# Patient Record
Sex: Female | Born: 1935 | Race: White | Hispanic: No | Smoking: Former smoker
Health system: Southern US, Community
[De-identification: ages and names within clinical notes are randomized; demographics above are authoritative.]

## PROBLEM LIST (undated history)

## (undated) DIAGNOSIS — S42309A Unspecified fracture of shaft of humerus, unspecified arm, initial encounter for closed fracture: Secondary | ICD-10-CM

## (undated) DIAGNOSIS — I480 Paroxysmal atrial fibrillation: Secondary | ICD-10-CM

## (undated) DIAGNOSIS — S72142D Displaced intertrochanteric fracture of left femur, subsequent encounter for closed fracture with routine healing: Secondary | ICD-10-CM

## (undated) DIAGNOSIS — S22080A Wedge compression fracture of T11-T12 vertebra, initial encounter for closed fracture: Secondary | ICD-10-CM

## (undated) DIAGNOSIS — F0150 Vascular dementia without behavioral disturbance: Secondary | ICD-10-CM

## (undated) DIAGNOSIS — F0390 Unspecified dementia without behavioral disturbance: Secondary | ICD-10-CM

## (undated) DIAGNOSIS — S72009A Fracture of unspecified part of neck of unspecified femur, initial encounter for closed fracture: Secondary | ICD-10-CM

## (undated) DIAGNOSIS — N390 Urinary tract infection, site not specified: Secondary | ICD-10-CM

## (undated) DIAGNOSIS — D62 Acute posthemorrhagic anemia: Secondary | ICD-10-CM

## (undated) DIAGNOSIS — I2510 Atherosclerotic heart disease of native coronary artery without angina pectoris: Secondary | ICD-10-CM

## (undated) DIAGNOSIS — K219 Gastro-esophageal reflux disease without esophagitis: Secondary | ICD-10-CM

## (undated) DIAGNOSIS — M810 Age-related osteoporosis without current pathological fracture: Secondary | ICD-10-CM

## (undated) DIAGNOSIS — I350 Nonrheumatic aortic (valve) stenosis: Secondary | ICD-10-CM

## (undated) DIAGNOSIS — M25561 Pain in right knee: Secondary | ICD-10-CM

## (undated) DIAGNOSIS — L8990 Pressure ulcer of unspecified site, unspecified stage: Secondary | ICD-10-CM

## (undated) DIAGNOSIS — F419 Anxiety disorder, unspecified: Secondary | ICD-10-CM

## (undated) DIAGNOSIS — R5381 Other malaise: Secondary | ICD-10-CM

## (undated) DIAGNOSIS — I119 Hypertensive heart disease without heart failure: Secondary | ICD-10-CM

## (undated) DIAGNOSIS — H6120 Impacted cerumen, unspecified ear: Secondary | ICD-10-CM

## (undated) DIAGNOSIS — E871 Hypo-osmolality and hyponatremia: Secondary | ICD-10-CM

## (undated) DIAGNOSIS — G4700 Insomnia, unspecified: Secondary | ICD-10-CM

## (undated) DIAGNOSIS — R339 Retention of urine, unspecified: Secondary | ICD-10-CM

## (undated) DIAGNOSIS — H9110 Presbycusis, unspecified ear: Secondary | ICD-10-CM

## (undated) DIAGNOSIS — F329 Major depressive disorder, single episode, unspecified: Secondary | ICD-10-CM

## (undated) DIAGNOSIS — E785 Hyperlipidemia, unspecified: Secondary | ICD-10-CM

## (undated) DIAGNOSIS — J309 Allergic rhinitis, unspecified: Secondary | ICD-10-CM

## (undated) DIAGNOSIS — T148XXA Other injury of unspecified body region, initial encounter: Secondary | ICD-10-CM

## (undated) DIAGNOSIS — R630 Anorexia: Secondary | ICD-10-CM

## (undated) DIAGNOSIS — R2681 Unsteadiness on feet: Secondary | ICD-10-CM

## (undated) DIAGNOSIS — E46 Unspecified protein-calorie malnutrition: Secondary | ICD-10-CM

## (undated) DIAGNOSIS — K5900 Constipation, unspecified: Secondary | ICD-10-CM

## (undated) DIAGNOSIS — F32A Depression, unspecified: Secondary | ICD-10-CM

## (undated) DIAGNOSIS — A499 Bacterial infection, unspecified: Secondary | ICD-10-CM

## (undated) DIAGNOSIS — R531 Weakness: Secondary | ICD-10-CM

## (undated) DIAGNOSIS — R29898 Other symptoms and signs involving the musculoskeletal system: Secondary | ICD-10-CM

## (undated) DIAGNOSIS — H409 Unspecified glaucoma: Secondary | ICD-10-CM

## (undated) DIAGNOSIS — Z1612 Extended spectrum beta lactamase (ESBL) resistance: Secondary | ICD-10-CM

## (undated) DIAGNOSIS — K5792 Diverticulitis of intestine, part unspecified, without perforation or abscess without bleeding: Secondary | ICD-10-CM

## (undated) HISTORY — DX: Wedge compression fracture of T11-T12 vertebra, initial encounter for closed fracture: S22.080A

## (undated) HISTORY — PX: CATARACT EXTRACTION, BILATERAL: SHX1313

## (undated) HISTORY — DX: Acute posthemorrhagic anemia: D62

## (undated) HISTORY — DX: Presbycusis, unspecified ear: H91.10

## (undated) HISTORY — PX: FRACTURE SURGERY: SHX138

## (undated) HISTORY — DX: Hyperlipidemia, unspecified: E78.5

## (undated) HISTORY — DX: Vascular dementia, unspecified severity, without behavioral disturbance, psychotic disturbance, mood disturbance, and anxiety: F01.50

## (undated) HISTORY — DX: Weakness: R53.1

## (undated) HISTORY — DX: Anorexia: R63.0

## (undated) HISTORY — DX: Allergic rhinitis, unspecified: J30.9

## (undated) HISTORY — DX: Unspecified protein-calorie malnutrition: E46

## (undated) HISTORY — DX: Retention of urine, unspecified: R33.9

## (undated) HISTORY — DX: Hypo-osmolality and hyponatremia: E87.1

## (undated) HISTORY — DX: Constipation, unspecified: K59.00

## (undated) HISTORY — DX: Age-related osteoporosis without current pathological fracture: M81.0

## (undated) HISTORY — PX: PARTIAL COLECTOMY: SHX5273

## (undated) HISTORY — DX: Insomnia, unspecified: G47.00

## (undated) HISTORY — DX: Atherosclerotic heart disease of native coronary artery without angina pectoris: I25.10

## (undated) HISTORY — DX: Pressure ulcer of unspecified site, unspecified stage: L89.90

## (undated) HISTORY — DX: Impacted cerumen, unspecified ear: H61.20

## (undated) HISTORY — DX: Other symptoms and signs involving the musculoskeletal system: R29.898

## (undated) HISTORY — DX: Major depressive disorder, single episode, unspecified: F32.9

## (undated) HISTORY — DX: Unsteadiness on feet: R26.81

## (undated) HISTORY — DX: Depression, unspecified: F32.A

## (undated) HISTORY — DX: Urinary tract infection, site not specified: N39.0

## (undated) HISTORY — DX: Pain in right knee: M25.561

## (undated) HISTORY — DX: Displaced intertrochanteric fracture of left femur, subsequent encounter for closed fracture with routine healing: S72.142D

## (undated) HISTORY — DX: Anxiety disorder, unspecified: F41.9

## (undated) HISTORY — DX: Diverticulitis of intestine, part unspecified, without perforation or abscess without bleeding: K57.92

## (undated) HISTORY — DX: Other malaise: R53.81

---

## 2007-04-14 LAB — HM COLONOSCOPY

## 2012-04-28 ENCOUNTER — Other Ambulatory Visit (INDEPENDENT_AMBULATORY_CARE_PROVIDER_SITE_OTHER): Payer: Medicare PPO

## 2012-04-28 ENCOUNTER — Encounter: Payer: Self-pay | Admitting: Internal Medicine

## 2012-04-28 ENCOUNTER — Ambulatory Visit (INDEPENDENT_AMBULATORY_CARE_PROVIDER_SITE_OTHER): Payer: Medicare PPO | Admitting: Internal Medicine

## 2012-04-28 VITALS — BP 118/68 | HR 69 | Temp 96.8°F | Ht 68.0 in | Wt 132.5 lb

## 2012-04-28 DIAGNOSIS — Z0000 Encounter for general adult medical examination without abnormal findings: Secondary | ICD-10-CM

## 2012-04-28 DIAGNOSIS — Z1329 Encounter for screening for other suspected endocrine disorder: Secondary | ICD-10-CM

## 2012-04-28 DIAGNOSIS — Z130 Encounter for screening for diseases of the blood and blood-forming organs and certain disorders involving the immune mechanism: Secondary | ICD-10-CM

## 2012-04-28 DIAGNOSIS — M949 Disorder of cartilage, unspecified: Secondary | ICD-10-CM

## 2012-04-28 DIAGNOSIS — M8580 Other specified disorders of bone density and structure, unspecified site: Secondary | ICD-10-CM

## 2012-04-28 DIAGNOSIS — I10 Essential (primary) hypertension: Secondary | ICD-10-CM | POA: Insufficient documentation

## 2012-04-28 DIAGNOSIS — Z131 Encounter for screening for diabetes mellitus: Secondary | ICD-10-CM

## 2012-04-28 DIAGNOSIS — Z13220 Encounter for screening for lipoid disorders: Secondary | ICD-10-CM

## 2012-04-28 DIAGNOSIS — K219 Gastro-esophageal reflux disease without esophagitis: Secondary | ICD-10-CM

## 2012-04-28 DIAGNOSIS — IMO0001 Reserved for inherently not codable concepts without codable children: Secondary | ICD-10-CM

## 2012-04-28 LAB — BASIC METABOLIC PANEL
BUN: 22 mg/dL (ref 6–23)
Chloride: 104 mEq/L (ref 96–112)
Creatinine, Ser: 1 mg/dL (ref 0.4–1.2)
Glucose, Bld: 111 mg/dL — ABNORMAL HIGH (ref 70–99)

## 2012-04-28 LAB — CBC
Hemoglobin: 12 g/dL (ref 12.0–15.0)
MCHC: 32.8 g/dL (ref 30.0–36.0)
MCV: 95.4 fl (ref 78.0–100.0)
Platelets: 314 10*3/uL (ref 150.0–400.0)
RBC: 3.83 Mil/uL — ABNORMAL LOW (ref 3.87–5.11)

## 2012-04-28 LAB — LIPID PANEL
Cholesterol: 182 mg/dL (ref 0–200)
Total CHOL/HDL Ratio: 4
Triglycerides: 124 mg/dL (ref 0.0–149.0)

## 2012-04-28 LAB — HEMOGLOBIN A1C: Hgb A1c MFr Bld: 6.1 % (ref 4.6–6.5)

## 2012-04-28 MED ORDER — OMEPRAZOLE 20 MG PO CPDR: 20.0000 mg | DELAYED_RELEASE_CAPSULE | Freq: Every day | ORAL | Status: DC

## 2012-04-28 MED ORDER — AMIODARONE HCL 200 MG PO TABS: 200.0000 mg | ORAL_TABLET | Freq: Every day | ORAL | Status: DC

## 2012-04-28 MED ORDER — AMLODIPINE BESYLATE 10 MG PO TABS: 10.0000 mg | ORAL_TABLET | Freq: Every day | ORAL | Status: DC

## 2012-04-28 MED ORDER — LISINOPRIL 2.5 MG PO TABS: 2.5000 mg | ORAL_TABLET | Freq: Every day | ORAL | Status: DC

## 2012-04-28 MED ORDER — ALENDRONATE SODIUM 70 MG PO TABS: 70.0000 mg | ORAL_TABLET | ORAL | Status: DC

## 2012-04-28 NOTE — Patient Instructions (Signed)

## 2012-04-28 NOTE — Progress Notes (Signed)
HPI  Pt presents to the clinic today to establish care. She recently moved here from [facility] and is transferring care here. She has no concerns today other than she wants her medications refilled.  Flu: 2013 Pneumovax: 2013 Zostavax: never Colonoscopy: 2013 Eye exam: 2013 Mammogram: 2013 Bone density: 2012 Tetanus: within 10 years  Past Medical History  Diagnosis Date  . Diverticulitis     Current Outpatient Prescriptions  Medication Sig Dispense Refill  . alendronate (FOSAMAX) 70 MG tablet Take 1 tablet (70 mg total) by mouth every 7 (seven) days. Take with a full glass of water on an empty stomach.  4 tablet  3  . amiodarone (PACERONE) 200 MG tablet Take 1 tablet (200 mg total) by mouth daily.  30 tablet  3  . amLODipine (NORVASC) 10 MG tablet Take 1 tablet (10 mg total) by mouth daily.  30 tablet  3  . aspirin EC 325 MG tablet Take 325 mg by mouth daily.      . folic acid (FOLVITE) 1 MG tablet Take 1 mg by mouth daily.      Marland Kitchen lisinopril (PRINIVIL,ZESTRIL) 2.5 MG tablet Take 1 tablet (2.5 mg total) by mouth daily.  30 tablet  3  . Multiple Vitamin (MULTIVITAMIN) tablet Take 1 tablet by mouth daily.      Marland Kitchen omeprazole (PRILOSEC) 20 MG capsule Take 1 capsule (20 mg total) by mouth daily.  30 capsule  3    No Known Allergies  Family History  Problem Relation Age of Onset  . Hypertension Other   . Cancer Mother     stomach  . Diabetes Neg Hx   . Heart disease Neg Hx   . Stroke Neg Hx     History   Social History  . Marital Status: Widowed    Spouse Name: N/A    Number of Children: N/A  . Years of Education: 13+   Occupational History  . Retired    Social History Main Topics  . Smoking status: Former Smoker    Types: Cigarettes  . Smokeless tobacco: Never Used  . Alcohol Use: No  . Drug Use: No  . Sexually Active: No   Other Topics Concern  . Not on file   Social History Narrative   Regular exercise-noCaffeine Use-yes    ROS:  Constitutional: Denies  fever, malaise, fatigue, headache or abrupt weight changes.  HEENT: Denies eye pain, eye redness, ear pain, ringing in the ears, wax buildup, runny nose, nasal congestion, bloody nose, or sore throat. Respiratory: Denies difficulty breathing, shortness of breath, cough or sputum production.   Cardiovascular: Denies chest pain, chest tightness, palpitations or swelling in the hands or feet.  Gastrointestinal: Pt reports mild constipation. Denies abdominal pain, bloating, diarrhea or blood in the stool.  GU: Denies frequency, urgency, pain with urination, blood in urine, odor or discharge. Musculoskeletal: Denies decrease in range of motion, difficulty with gait, muscle pain or joint pain and swelling.  Skin: Denies redness, rashes, lesions or ulcercations.  Neurological: Denies dizziness, difficulty with memory, difficulty with speech or problems with balance and coordination.   No other specific complaints in a complete review of systems (except as listed in HPI above).  PE:  BP 118/68  Pulse 69  Temp 96.8 F (36 C) (Oral)  Wt 132 lb 8 oz (60.102 kg)  SpO2 98% Wt Readings from Last 3 Encounters:  04/28/12 132 lb 8 oz (60.102 kg)    General: Appears her stated age, well developed,  well nourished in NAD. HEENT: Head: normal shape and size; Eyes: sclera white, no icterus, conjunctiva pink, PERRLA and EOMs intact; Ears: Tm's gray and intact, normal light reflex; Nose: mucosa pink and moist, septum midline; Throat/Mouth: Teeth present, mucosa pink and moist, no lesions or ulcerations noted.  Neck: Normal range of motion. Neck supple, trachea midline. No massses, lumps or thyromegaly present.  Cardiovascular: Normal rate and rhythm. S1,S2 noted.  No murmur, rubs or gallops noted. No JVD or BLE edema. No carotid bruits noted. Pulmonary/Chest: Normal effort and positive vesicular breath sounds. No respiratory distress. No wheezes, rales or ronchi noted.  Abdomen: Soft and nontender. Normal bowel  sounds, no bruits noted. No distention or masses noted. Liver, spleen and kidneys non palpable. Musculoskeletal: Normal range of motion. No signs of joint swelling. No difficulty with gait.  Neurological: Alert and oriented. Cranial nerves II-XII intact. Coordination normal. +DTRs bilaterally. Psychiatric: Mood and affect normal. Behavior is normal. Judgment and thought content normal.     Assessment and Plan:  Preventative Health Maintenance:  Continue Healthy Lifestyle Will obtain basic screening labs today Pt declines Zostavax All HM UTD  Hypertension:  Continue low sodium diet Refilled all meds  Reflux:  Avoid spicy foods and mints Refilled meds  Age related bone loss:  Continue low weight bearing exercise Refilled meds  RTC in 6 months or sooner if needed

## 2012-04-29 ENCOUNTER — Encounter: Payer: Self-pay | Admitting: Internal Medicine

## 2012-04-29 ENCOUNTER — Telehealth: Payer: Self-pay | Admitting: Internal Medicine

## 2012-04-29 LAB — VITAMIN D 25 HYDROXY (VIT D DEFICIENCY, FRACTURES): Vit D, 25-Hydroxy: 44 ng/mL (ref 30–89)

## 2012-04-29 MED ORDER — FOLIC ACID 1 MG PO TABS: 1.0000 mg | ORAL_TABLET | Freq: Every day | ORAL | Status: DC

## 2012-04-29 NOTE — Telephone Encounter (Signed)
Rx for Folic Acid sent to [facility] and pt informed of lab results.    Jacqueline [name],  Can you please send this and call Mrs. Winders and let her know that all of her labs were normal.  Jacqueline [name]

## 2012-04-29 NOTE — Telephone Encounter (Signed)
Patient is requesting a refill on her folic Acid be sent to [facility]

## 2012-05-04 ENCOUNTER — Telehealth: Payer: Self-pay | Admitting: Internal Medicine

## 2012-05-04 NOTE — Telephone Encounter (Signed)
Pt got a letter in the mail with her lab results.  She has questions about what "essentially normal" means.  She is requesting a call.

## 2012-05-05 NOTE — Telephone Encounter (Signed)
Pt informed of NP's advisement.  

## 2012-05-05 NOTE — Telephone Encounter (Signed)
Ash, Essentially normal means that even though her RBC was low, her glucose level was high (if she had eaten) and the fact that her GFR was low, that the abnormal levels are nothing to worry about. These can be normal variations with age. So, even though the appear abnormal on the lab report, they are normal for an person her age. Rene Kocher

## 2012-05-16 ENCOUNTER — Telehealth: Payer: Self-pay | Admitting: Internal Medicine

## 2012-05-16 NOTE — Telephone Encounter (Signed)
[place], could you call this patient to schedule an appointment with one of the physicians at [facility]? Thanks!

## 2012-05-16 NOTE — Telephone Encounter (Signed)
Patient would like to start seeing one of the physicians at the [facility] location due to the location being closer to her. Is this okay? Thanks!

## 2012-05-16 NOTE — Telephone Encounter (Signed)
Ok with me 

## 2012-05-17 NOTE — Telephone Encounter (Signed)
Spoke w/Dr. Beverely Low who said it would be fine for patient to transfer to [facility]. Spoke w/pt. Pt understood.

## 2012-07-04 ENCOUNTER — Telehealth: Payer: Self-pay | Admitting: Family Medicine

## 2012-07-04 ENCOUNTER — Ambulatory Visit (INDEPENDENT_AMBULATORY_CARE_PROVIDER_SITE_OTHER): Payer: Medicare PPO | Admitting: Family Medicine

## 2012-07-04 ENCOUNTER — Encounter: Payer: Self-pay | Admitting: Family Medicine

## 2012-07-04 VITALS — BP 110/60 | HR 60 | Temp 98.7°F | Ht 65.5 in | Wt 133.4 lb

## 2012-07-04 DIAGNOSIS — I10 Essential (primary) hypertension: Secondary | ICD-10-CM

## 2012-07-04 DIAGNOSIS — G4700 Insomnia, unspecified: Secondary | ICD-10-CM

## 2012-07-04 DIAGNOSIS — R011 Cardiac murmur, unspecified: Secondary | ICD-10-CM

## 2012-07-04 DIAGNOSIS — H9113 Presbycusis, bilateral: Secondary | ICD-10-CM

## 2012-07-04 DIAGNOSIS — H9110 Presbycusis, unspecified ear: Secondary | ICD-10-CM

## 2012-07-04 MED ORDER — ZOLPIDEM TARTRATE 5 MG PO TABS: 2.5000 mg | ORAL_TABLET | Freq: Every evening | ORAL | Status: DC | PRN

## 2012-07-04 MED ORDER — AMIODARONE HCL 200 MG PO TABS: 200.0000 mg | ORAL_TABLET | Freq: Every day | ORAL | Status: DC

## 2012-07-04 NOTE — Patient Instructions (Addendum)
Follow up in 6 weeks to recheck sleep Start the Zolpidem nightly- 1/2 tab only- for sleep We'll call you with your hearing appt and your cardiology appt to evaluate your murmur Please call me and let me know if you are taking the Amiodarone Call with any questions or concerns Welcome!  We're glad to have you!

## 2012-07-04 NOTE — Telephone Encounter (Signed)
Medications are the same and already documented in the chart.   KP

## 2012-07-04 NOTE — Telephone Encounter (Signed)
Patient states that she was supposed to call back with this information  She is not taking amiodarine, she is taking pacerone

## 2012-07-04 NOTE — Progress Notes (Signed)
  Subjective:    Patient ID: Jacqueline [name], female    DOB: Sep 01, 1928, 79 y.o.   MRN: [id number]  HPI New to establish.  Previously seeing NP at [facility].  HTN- chronic problem, excellent control on Norvasc, Lisinopril.  Denies CP, SOB, HAs, visual changes, edema.   Osteoporosis- chronic problem, UTD on DEXA, on Fosamax weekly.  Walking regularly.  GERD- chronic problem.  sxs are well controlled on Omeprazole.  Denies any current heartburn  Hearing loss- pt would like referral for hearing test, son complains that she cannot hear him.  Insomnia- pt reports 'years' of difficulty sleeping.  Trouble both falling and staying asleep.  Has not tried anything OTC.     Review of Systems For ROS see HPI     Objective:   Physical Exam  Vitals reviewed. Constitutional: She is oriented to person, place, and time. She appears well-developed and well-nourished. No distress.  HENT:  Head: Normocephalic and atraumatic.  Hard of hearing  Eyes: Conjunctivae and EOM are normal. Pupils are equal, round, and reactive to light.  Neck: Normal range of motion. Neck supple. No thyromegaly present.  Cardiovascular: Normal rate, regular rhythm and intact distal pulses.   Murmur (II-III/VI blowing SEM heard best at upper sternal border) heard. Pulmonary/Chest: Effort normal and breath sounds normal. No respiratory distress. She has no wheezes. She has no rales.  Abdominal: Soft. She exhibits no distension. There is no tenderness. There is no rebound and no guarding.  Musculoskeletal: She exhibits no edema.  Lymphadenopathy:    She has no cervical adenopathy.  Neurological: She is alert and oriented to person, place, and time.  Skin: Skin is warm and dry.  Psychiatric: She has a normal mood and affect. Her behavior is normal.          Assessment & Plan:

## 2012-07-05 ENCOUNTER — Telehealth: Payer: Self-pay | Admitting: *Deleted

## 2012-07-05 NOTE — Telephone Encounter (Signed)
Patient called to make you aware that she is taking amiodarone.

## 2012-07-08 NOTE — Assessment & Plan Note (Signed)
New.  Refer to audiology for complete evaluation and tx 

## 2012-07-08 NOTE — Assessment & Plan Note (Signed)
New.  Difficult problem for elderly as most medications used for sleep carry some risk in this population.  Not interested in Benzos or antihistamine due to side effects and risk of falling.  Trazodone would be my 1st choice but it interacts w/ amiodarone (again, not sure if she even needs this).  Will start low dose Ambien and monitor for side effects and symptom improvement.  Pt expressed understanding and is in agreement w/ plan.

## 2012-07-08 NOTE — Assessment & Plan Note (Signed)
New to provider, chronic for pt.  Excellent control.  Asymptomatic.  Has never had w/u for murmur and pt doesn't know why she is on amiodarone and there is no explanation for this in previous OV.  Would like to stop med if possible- will enlist the help of cardiology w/ this.

## 2012-07-08 NOTE — Assessment & Plan Note (Signed)
New.  Refer to cards for evaluation and ECHO.  Also pt reports she is on Amiodarone but denies ever seeing cards previously.  Unclear why she is on thie medicine and if she even needs to be.

## 2012-07-14 ENCOUNTER — Telehealth: Payer: Self-pay | Admitting: *Deleted

## 2012-07-14 NOTE — Telephone Encounter (Signed)
Spoke with pt made her aware that audiology appt had not been made as of yet. Advised she would be contacted by practice when it is scheduled.

## 2012-07-18 ENCOUNTER — Telehealth: Payer: Self-pay | Admitting: Family Medicine

## 2012-07-18 NOTE — Telephone Encounter (Signed)
Pt should stop Ambien and wait for her cardiology work up before we start any medications (since they can interact w/ her amiodarone)

## 2012-07-18 NOTE — Telephone Encounter (Signed)
Discuss with patient  

## 2012-07-18 NOTE — Telephone Encounter (Signed)
Patient states she received a letter from [facility] stating that zolpidem tartrate is not recommended for people her age due to side effects. Patient states she does not like the way this medication makes her feel and she would like to know what to do. Please call pt back at home, if no answer please try mobile.

## 2012-08-02 ENCOUNTER — Telehealth: Payer: Self-pay | Admitting: Family Medicine

## 2012-08-02 NOTE — Telephone Encounter (Signed)
Error. BC °

## 2012-08-03 ENCOUNTER — Ambulatory Visit (INDEPENDENT_AMBULATORY_CARE_PROVIDER_SITE_OTHER): Payer: Medicare PPO | Admitting: Family Medicine

## 2012-08-03 ENCOUNTER — Telehealth: Payer: Self-pay | Admitting: Family Medicine

## 2012-08-03 ENCOUNTER — Institutional Professional Consult (permissible substitution): Payer: Medicare PPO | Admitting: Cardiovascular Disease

## 2012-08-03 ENCOUNTER — Encounter: Payer: Self-pay | Admitting: Lab

## 2012-08-03 ENCOUNTER — Encounter: Payer: Self-pay | Admitting: Family Medicine

## 2012-08-03 VITALS — BP 124/74 | HR 59 | Temp 97.3°F | Ht 65.5 in | Wt 132.0 lb

## 2012-08-03 DIAGNOSIS — K219 Gastro-esophageal reflux disease without esophagitis: Secondary | ICD-10-CM

## 2012-08-03 DIAGNOSIS — R0789 Other chest pain: Secondary | ICD-10-CM

## 2012-08-03 DIAGNOSIS — R82998 Other abnormal findings in urine: Secondary | ICD-10-CM

## 2012-08-03 DIAGNOSIS — N390 Urinary tract infection, site not specified: Secondary | ICD-10-CM

## 2012-08-03 DIAGNOSIS — R8290 Unspecified abnormal findings in urine: Secondary | ICD-10-CM

## 2012-08-03 DIAGNOSIS — IMO0001 Reserved for inherently not codable concepts without codable children: Secondary | ICD-10-CM

## 2012-08-03 LAB — D-DIMER, QUANTITATIVE: D-Dimer, Quant: 0.84 ug/mL-FEU — ABNORMAL HIGH (ref 0.00–0.48)

## 2012-08-03 NOTE — Patient Instructions (Addendum)
We'll call you with your lab results Increase your Omeprazole to twice daily If you have worsening chest pressure- please go to the ER Store your urine sample in the fridge and bring it by when you are able Restart the 1/2 tab of Zolpidem nightly for sleep Call with any questions or concerns Hang in there!

## 2012-08-03 NOTE — Progress Notes (Signed)
  Subjective:    Patient ID: Jacqueline [name], female    DOB: [birth year], 79 y.o.   MRN: [id number]  HPI Urinary odor- has odor to urine.  sxs started a couple days ago.  No burning.  + frequency and urgency but pt reports this is ongoing.  No fevers, chills, back pain.  SOB- sxs are intermittent, will have sensation of chest heaviness, shortness of breath.  Denies palpitations.  + anxiety- daughter reports 'she's a worrier'.  Episodes will last ~20 minutes.  Unable to lie flat, 'it makes everything worse'.  Has associated epigastric pain.  No relation to eating.  Had episode overnight- started when she woke to use restroom.  Last episode was early this AM.  Tends to only occur overnight or early in the AM.  No recent immobility, leg swelling.  Currently asymptomatic.   Review of Systems For ROS see HPI    Objective:   Physical Exam  Vitals reviewed. Constitutional: She is oriented to person, place, and time. She appears well-developed and well-nourished. No distress.  HENT:  Head: Normocephalic and atraumatic.  Eyes: Conjunctivae and EOM are normal. Pupils are equal, round, and reactive to light.  Neck: Normal range of motion. Neck supple. No thyromegaly present.  Cardiovascular: Normal rate, regular rhythm and intact distal pulses.   Murmur (loud, holosystolic III/VI SEM heard best and RUSB) heard. Pulmonary/Chest: Effort normal and breath sounds normal. No respiratory distress. She has no wheezes. She has no rales. She exhibits no tenderness.  Abdominal: Soft. She exhibits no distension. There is no tenderness.  Musculoskeletal: She exhibits no edema and no tenderness.  Lymphadenopathy:    She has no cervical adenopathy.  Neurological: She is alert and oriented to person, place, and time.  Skin: Skin is warm and dry.  Psychiatric: She has a normal mood and affect. Her behavior is normal.          Assessment & Plan:

## 2012-08-03 NOTE — Telephone Encounter (Signed)
Patient Information:  Caller Name: Cam[place]yn  Phone: [phone]  Patient: Jacqueline, [name]  Gender: Female  DOB: 04/26/1928  Age: 79 Years  PCP: Sheliah Hatch.  Office Follow Up:  Does the office need to follow up with this patient?: No  Instructions For The Office: N/A  RN Note:  Appointment scheduled already for 3:00pm this afternoon with Dr. Beverely Low. Advised patient to call 911 if she develops chest pain lasting longer than 5 minutes or if symptoms worsen. Patient verbalized understanding.  Symptoms  Reason For Call & Symptoms: Reports feeling hot from head to toe, chest pressure, shortness of breath. Feels tired most of the day after having episodes. Reports feeling lightheaded during episodes also. 1-2 episodes noted each day.  Reviewed Health History In EMR: Yes  Reviewed Medications In EMR: Yes  Reviewed Allergies In EMR: Yes  Reviewed Surgeries / Procedures: Yes  Date of Onset of Symptoms: 07/20/2012  Guideline(s) Used:  Chest Pain  Disposition Per Guideline:   Go to ED Now (or to Office with PCP Approval)  Reason For Disposition Reached:   Intermittent chest pain and pain has been increasing in severity or frequency  Advice Given:  N/A  Patient Will Follow Care Advice:  YES

## 2012-08-04 ENCOUNTER — Telehealth: Payer: Self-pay | Admitting: Family Medicine

## 2012-08-04 ENCOUNTER — Telehealth: Payer: Self-pay | Admitting: *Deleted

## 2012-08-04 LAB — POCT URINALYSIS DIPSTICK
Bilirubin, UA: NEGATIVE
Glucose, UA: NEGATIVE
Nitrite, UA: NEGATIVE
Urobilinogen, UA: 0.2
pH, UA: 6

## 2012-08-04 MED ORDER — CEPHALEXIN 500 MG PO CAPS: 500.0000 mg | ORAL_CAPSULE | Freq: Two times a day (BID) | ORAL | Status: DC

## 2012-08-04 MED ORDER — OMEPRAZOLE 20 MG PO CPDR: 20.0000 mg | DELAYED_RELEASE_CAPSULE | Freq: Two times a day (BID) | ORAL | Status: DC

## 2012-08-04 NOTE — Telephone Encounter (Signed)
Spoke with the pt and informed her of recent UC results and note.  Pt understood and agreed.   New rx for Keflex 500mg  1 tab bid x 5 days was sent to the pharmacy by e-script.  Pt stated that Dr. Beverely Low increased her Omeprazole 20mg  to twice a day, and she will be needing a new rx..  Also sent in new rx for the Omeprazole 20mg  1 tab bid to the pharmacy by e-script.//AB/CMA

## 2012-08-04 NOTE — Telephone Encounter (Signed)
[facility] Triage Call Report Triage Record Num: [id number] Operator: Remonia Richter Patient Name: Jacqueline [name] Call Date & Time: 08/03/2012 7:04:31PM Patient Phone: [phone] PCP: Lezlie Octave Patient Gender: Female PCP Fax : [phone] Patient DOB: 12/21/1928 Practice Name: [facility] Reason for Call: [facility] calling to report lab drawn 08/03/12 as stat with Triponin I negative at 0.02 and D dimer pending, Dr Felicity Coyer called with lab and notified Protocol(s) Used: PCP Calls, No Triage (Adult) Recommended Outcome per Protocol: Call Provider within 24 Hours Reason for Outcome: Lab calling with test results Care Advice: ~

## 2012-08-04 NOTE — Telephone Encounter (Signed)
Message copied by Verdie Shire on Thu Aug 04, 2012  5:16 PM ------      Message from: Sheliah Hatch      Created: Thu Aug 04, 2012  4:38 PM       Pt's urine is highly suspicious for infxn.  Start Keflex 500mg  BID x5 days ------

## 2012-08-05 NOTE — Assessment & Plan Note (Signed)
New.  Atypical for cardiac pain.  Pt w/ cardiology appt upcoming.  Will check troponin to assess for myocardial damage.  Suspicion for PE very low but will check DDimer.  EKG w/out acute abnormality.  Suspect pt's sxs are more a combo of GERD and anxiety.  Increase PPI to BID and pt to take Zolpidem for sleep.  Reviewed supportive care and red flags that should prompt return.  Pt expressed understanding and is in agreement w/ plan.

## 2012-08-05 NOTE — Assessment & Plan Note (Signed)
New.  Pt unable to give urine sample while in office.  Sent home w/ specimen cup to return in AM.  UA highly suspicious for infxn.  Start abx.  Await culture.

## 2012-08-05 NOTE — Assessment & Plan Note (Signed)
Deteriorated.  Double PPI to BID dosing.  Pt expressed understanding and is in agreement w/ plan.

## 2012-08-07 LAB — URINE CULTURE: Colony Count: >=100000

## 2012-08-09 ENCOUNTER — Ambulatory Visit: Payer: Medicare PPO | Admitting: Audiology

## 2012-08-10 ENCOUNTER — Telehealth: Payer: Self-pay | Admitting: Family Medicine

## 2012-08-10 NOTE — Telephone Encounter (Signed)
Pt notified to take the abx per Tabori's request. Pt stated that she just began the meds because her son just picked them up yesterday.

## 2012-08-10 NOTE — Telephone Encounter (Signed)
Pt states that she has had diarrhea only today. States that she began having diarrhea about 3-4 hours after her first dose of Keflex. States that she has a history of diarrhea but she has only had it once. The only difference in her diet is the Keflex. Please advise.

## 2012-08-10 NOTE — Telephone Encounter (Signed)
Patient having diarrhea from her cephALEXin (KEFLEX) 500 MG capsule . She is needing a call before her next dose this evening.

## 2012-08-10 NOTE — Telephone Encounter (Signed)
Keflex is the most gentle of the abx.  Since she only took one dose I would continue the medication and see if her sxs continue.  Also, please stress that she needs to take medication- this prescription was sent for her 6 days ago and should have been started before now.

## 2012-08-11 ENCOUNTER — Ambulatory Visit: Payer: Medicare HMO | Attending: Audiology | Admitting: Audiology

## 2012-08-11 DIAGNOSIS — Z0110 Encounter for examination of ears and hearing without abnormal findings: Secondary | ICD-10-CM | POA: Insufficient documentation

## 2012-08-11 DIAGNOSIS — Z0389 Encounter for observation for other suspected diseases and conditions ruled out: Secondary | ICD-10-CM | POA: Insufficient documentation

## 2012-08-11 DIAGNOSIS — H9113 Presbycusis, bilateral: Secondary | ICD-10-CM

## 2012-08-11 NOTE — Progress Notes (Signed)
Subjective:     Patient ID: Jacqueline [name], female   DOB: Jun 26, 1928, 79 y.o.   MRN: [id number]  HPI Comments: Name:  Jacqueline [name] DOB:   20-Apr-1928 MRN:    [id number] Date of Evaluation:  08/11/2012  History: Pt. In 79 y/o female with complaint of decreased hearing for past 6 months.  Notes difficulty in listening to the TV at a normal volume,listening in the presence of background noise and often needs conversational speech repeated.  No history of ear infections but does not an uncomfortable feeling in the right ear when trying to sleep on that side.  Pure Tone Thresholds:  Mild to profound sensory neural loss on the right side beginning at 2000Hz  - 8000Hz  and a moderate to profound sensory neural loss on the left side for the same frequencies.  Speech Reception/Detection Thresholds: Right Ear: 25dBHL Left Ear: 20dBHL  Speech Recognition Testing: Right Ear:        92 @ 55dBHL,        54% @ 55 dBHL s/n+5 Left Ear:          74%@ 55 dBHL     26% @ 55dBHL s/n+5   Tympanometry:  Normal middle ear function bilaterally.  Otoscopic examination revealed excessive non-occlusive wax on the right side.  Normal landmarks were revealed on the left side.  Acoustic Reflex: Present bilaterally with ipsilateral stimulation from 500Hz  - 2000Hz .  Responses were elevated on both sides at 2000Hz  (100db)  Impressions:  Significant sensory neural hearing loss on both sides which would impact her communication abilities.  Excessive non-occlusive wax on the right side.  Recommendations:  1.  Please have your physician remove the excessive wax noted on the right side.  Although it is not occlusive now and therefore not impacting your hearing acuity, it most likely will in the future if not removed. 2.  Please consider a hearing aid trial/fitting.  You were given a list of at least 3 local providers that may be of assistance. 3.  Annual audiological  evaluaitons    Joany Khatib V. Richarda Blade-  Audiology 08/11/2012 12:40 PM    cc: [name], MD     Review of Systems     Objective:   Physical Exam

## 2012-08-18 ENCOUNTER — Ambulatory Visit (INDEPENDENT_AMBULATORY_CARE_PROVIDER_SITE_OTHER): Payer: Medicare PPO | Admitting: Cardiology

## 2012-08-18 ENCOUNTER — Telehealth: Payer: Self-pay | Admitting: Cardiology

## 2012-08-18 ENCOUNTER — Encounter: Payer: Self-pay | Admitting: Cardiology

## 2012-08-18 ENCOUNTER — Inpatient Hospital Stay (HOSPITAL_COMMUNITY)
Admission: AD | Admit: 2012-08-18 | Discharge: 2012-09-02 | DRG: 234 | Disposition: A | Discharge status: Home / Self Care | Payer: Medicare HMO | Source: Ambulatory Visit | Attending: Surgery | Admitting: Surgery

## 2012-08-18 VITALS — BP 130/80 | HR 63 | Wt 128.0 lb

## 2012-08-18 DIAGNOSIS — I200 Unstable angina: Secondary | ICD-10-CM | POA: Diagnosis present

## 2012-08-18 DIAGNOSIS — R0789 Other chest pain: Secondary | ICD-10-CM

## 2012-08-18 DIAGNOSIS — E119 Type 2 diabetes mellitus without complications: Secondary | ICD-10-CM | POA: Diagnosis present

## 2012-08-18 DIAGNOSIS — R112 Nausea with vomiting, unspecified: Secondary | ICD-10-CM | POA: Diagnosis present

## 2012-08-18 DIAGNOSIS — M810 Age-related osteoporosis without current pathological fracture: Secondary | ICD-10-CM | POA: Diagnosis present

## 2012-08-18 DIAGNOSIS — R011 Cardiac murmur, unspecified: Secondary | ICD-10-CM | POA: Diagnosis present

## 2012-08-18 DIAGNOSIS — I10 Essential (primary) hypertension: Secondary | ICD-10-CM

## 2012-08-18 DIAGNOSIS — I2510 Atherosclerotic heart disease of native coronary artery without angina pectoris: Principal | ICD-10-CM

## 2012-08-18 DIAGNOSIS — R5381 Other malaise: Secondary | ICD-10-CM | POA: Diagnosis present

## 2012-08-18 DIAGNOSIS — D62 Acute posthemorrhagic anemia: Secondary | ICD-10-CM | POA: Diagnosis not present

## 2012-08-18 DIAGNOSIS — Z951 Presence of aortocoronary bypass graft: Secondary | ICD-10-CM

## 2012-08-18 LAB — CBC WITH DIFFERENTIAL/PLATELET
Basophils Absolute: 0.1 10*3/uL (ref 0.0–0.1)
Basophils Relative: 1 % (ref 0–1)
HCT: 36 % (ref 36.0–46.0)
Hemoglobin: 11.9 g/dL — ABNORMAL LOW (ref 12.0–15.0)
Lymphocytes Relative: 12 % (ref 12–46)
MCHC: 33.1 g/dL (ref 30.0–36.0)
Monocytes Absolute: 0.5 10*3/uL (ref 0.1–1.0)
Neutro Abs: 4.8 10*3/uL (ref 1.7–7.7)
Neutrophils Relative %: 76 % (ref 43–77)
RDW: 15.9 % — ABNORMAL HIGH (ref 11.5–15.5)
WBC: 6.3 10*3/uL (ref 4.0–10.5)

## 2012-08-18 LAB — COMPREHENSIVE METABOLIC PANEL WITH GFR
ALT: 13 U/L (ref 0–35)
AST: 20 U/L (ref 0–37)
Albumin: 3.6 g/dL (ref 3.5–5.2)
Alkaline Phosphatase: 78 U/L (ref 39–117)
BUN: 21 mg/dL (ref 6–23)
CO2: 25 meq/L (ref 19–32)
Calcium: 9.5 mg/dL (ref 8.4–10.5)
Chloride: 105 meq/L (ref 96–112)
Creatinine, Ser: 0.9 mg/dL (ref 0.50–1.10)
GFR calc Af Amer: 68 mL/min — ABNORMAL LOW
GFR calc non Af Amer: 58 mL/min — ABNORMAL LOW
Glucose, Bld: 108 mg/dL — ABNORMAL HIGH (ref 70–99)
Potassium: 4.6 meq/L (ref 3.5–5.1)
Sodium: 138 meq/L (ref 135–145)
Total Bilirubin: 0.3 mg/dL (ref 0.3–1.2)
Total Protein: 7.5 g/dL (ref 6.0–8.3)

## 2012-08-18 LAB — PROTIME-INR: Prothrombin Time: 13.1 seconds (ref 11.6–15.2)

## 2012-08-18 LAB — TSH: TSH: 1.617 u[IU]/mL (ref 0.350–4.500)

## 2012-08-18 LAB — TROPONIN I: Troponin I: <0.3 ng/mL

## 2012-08-18 MED ORDER — ALPRAZOLAM 0.25 MG PO TABS
0.2500 mg | ORAL_TABLET | Freq: Two times a day (BID) | ORAL | Status: DC | PRN
  Administered 2012-08-21: 0.25 mg via ORAL
  Filled 2012-08-18 (×2): qty 1

## 2012-08-18 MED ORDER — ACETAMINOPHEN 325 MG PO TABS
650.0000 mg | ORAL_TABLET | ORAL | Status: DC | PRN
  Administered 2012-08-22 – 2012-08-25 (×2): 650 mg via ORAL
  Filled 2012-08-18 (×2): qty 2

## 2012-08-18 MED ORDER — ZOLPIDEM TARTRATE 5 MG PO TABS
2.5000 mg | ORAL_TABLET | Freq: Every evening | ORAL | Status: DC | PRN
  Administered 2012-08-19 – 2012-08-23 (×5): 2.5 mg via ORAL
  Filled 2012-08-18 (×6): qty 1

## 2012-08-18 MED ORDER — HEPARIN SODIUM (PORCINE) 5000 UNIT/ML IJ SOLN
5000.0000 [IU] | Freq: Three times a day (TID) | INTRAMUSCULAR | Status: DC
  Filled 2012-08-18 (×3): qty 1

## 2012-08-18 MED ORDER — NITROGLYCERIN 0.4 MG SL SUBL
0.4000 mg | SUBLINGUAL_TABLET | SUBLINGUAL | Status: DC | PRN
  Administered 2012-08-24: 0.4 mg via SUBLINGUAL
  Filled 2012-08-18: qty 25

## 2012-08-18 MED ORDER — ASPIRIN EC 325 MG PO TBEC
325.0000 mg | DELAYED_RELEASE_TABLET | Freq: Every evening | ORAL | Status: DC
  Administered 2012-08-18 – 2012-08-21 (×4): 325 mg via ORAL
  Filled 2012-08-18 (×4): qty 1

## 2012-08-18 MED ORDER — SODIUM CHLORIDE 0.9 % IV SOLN: 250.0000 mL | INTRAVENOUS | Status: DC | PRN

## 2012-08-18 MED ORDER — AMLODIPINE BESYLATE 10 MG PO TABS
10.0000 mg | ORAL_TABLET | Freq: Every day | ORAL | Status: DC
  Administered 2012-08-19 – 2012-08-24 (×6): 10 mg via ORAL
  Filled 2012-08-18 (×7): qty 1

## 2012-08-18 MED ORDER — LISINOPRIL 2.5 MG PO TABS
2.5000 mg | ORAL_TABLET | Freq: Every day | ORAL | Status: DC
  Administered 2012-08-19 – 2012-08-24 (×6): 2.5 mg via ORAL
  Filled 2012-08-18 (×7): qty 1

## 2012-08-18 MED ORDER — PANTOPRAZOLE SODIUM 40 MG PO TBEC
40.0000 mg | DELAYED_RELEASE_TABLET | Freq: Every day | ORAL | Status: DC
  Administered 2012-08-18 – 2012-08-24 (×7): 40 mg via ORAL
  Filled 2012-08-18 (×7): qty 1

## 2012-08-18 MED ORDER — SODIUM CHLORIDE 0.9 % IJ SOLN
3.0000 mL | Freq: Two times a day (BID) | INTRAMUSCULAR | Status: DC
  Administered 2012-08-18 – 2012-08-21 (×6): 3 mL via INTRAVENOUS

## 2012-08-18 MED ORDER — FOLIC ACID 1 MG PO TABS
1.0000 mg | ORAL_TABLET | Freq: Every day | ORAL | Status: DC
  Administered 2012-08-19 – 2012-08-24 (×6): 1 mg via ORAL
  Filled 2012-08-18 (×7): qty 1

## 2012-08-18 MED ORDER — AMIODARONE HCL 200 MG PO TABS
200.0000 mg | ORAL_TABLET | Freq: Every day | ORAL | Status: DC
  Administered 2012-08-19 – 2012-08-24 (×6): 200 mg via ORAL
  Filled 2012-08-18 (×7): qty 1

## 2012-08-18 MED ORDER — ADULT MULTIVITAMIN W/MINERALS CH
1.0000 | ORAL_TABLET | Freq: Every day | ORAL | Status: DC
  Administered 2012-08-19 – 2012-08-24 (×6): 1 via ORAL
  Filled 2012-08-18 (×7): qty 1

## 2012-08-18 MED ORDER — REGADENOSON 0.4 MG/5ML IV SOLN
0.4000 mg | Freq: Once | INTRAVENOUS | Status: DC
  Filled 2012-08-18: qty 5

## 2012-08-18 MED ORDER — ONDANSETRON HCL 4 MG/2ML IJ SOLN: 4.0000 mg | Freq: Four times a day (QID) | INTRAMUSCULAR | Status: DC | PRN

## 2012-08-18 MED ORDER — ONE-DAILY MULTI VITAMINS PO TABS: 1.0000 | ORAL_TABLET | Freq: Every day | ORAL | Status: DC

## 2012-08-18 MED ORDER — SODIUM CHLORIDE 0.9 % IJ SOLN: 3.0000 mL | INTRAMUSCULAR | Status: DC | PRN

## 2012-08-18 NOTE — Assessment & Plan Note (Signed)
Continue present blood pressure medications and monitor.

## 2012-08-18 NOTE — Assessment & Plan Note (Signed)
Patient presents with complaints of chest pain. History is very difficult and she does not recall her past medical history very well. Apparently she has had prior PCI. She is also on amiodarone for unclear reasons. Her pain predominantly occurs at night and improves with sitting up. It certainly could be reflux. However given history of coronary disease and ECG changes noted on April 23, I will admit and cycle enzymes. If negative we will plan a functional study tomorrow morning. We need all of her previous records from her previous physicians concerning her cardiac history. I will continue her amiodarone for now until we receive those records. We will treat with aspirin and DVT prophylaxis heparin unless her enzymes are positive. Continue omeprazole.

## 2012-08-18 NOTE — Assessment & Plan Note (Signed)
Plan echocardiogram to further assess as an outpatient.

## 2012-08-18 NOTE — Telephone Encounter (Signed)
ROI faxed to [facility]  08/18/12/KM

## 2012-08-18 NOTE — Progress Notes (Signed)
Utilization review completed.  

## 2012-08-18 NOTE — H&P (Signed)
Jacqueline [name]  08/18/2012 11:30 AM   Office Visit  MRN:  [id number]   Description: 79 year old female  Provider: Lewayne Bunting, MD  Department: [facility]        Referring Provider    Sheliah Hatch, MD      Diagnoses    Chest heaviness    -  Primary    786.59    Systolic murmur        785.2    HTN (hypertension)        401.9      Reason for Visit    Heart Murmur    New patient evaluation         Progress Notes    Lewayne Bunting, MD at 08/18/2012  1:32 PM    Status: Signed                    HPI: 79 year old female for evaluation of chest pain. Patient has poor recollection of previous cardiac history. She previously lived in [place]. She is on amiodarone and clear reasons. Her son states that she had angioplasty years ago but I do not have those details. Over the past 2 months she has had intermittent chest pain. It is substernal it occurs at night. It is described as indigestion. It does not radiate and there are no associated symptoms. It improves with sitting up on the side of the bed. She does not have exertional chest pain. She had an episode this morning that awoke her from sleep. It lasted approximately 20 minutes and resolved. She has been placed on omeprazole with no improvement. She has some dyspnea on exertion but no orthopnea, PND or pedal edema. No syncope.    Current Outpatient Prescriptions   Medication  Sig  Dispense  Refill   .  alendronate (FOSAMAX) 70 MG tablet  Take 1 tablet (70 mg total) by mouth every 7 (seven) days. Take with a full glass of water on an empty stomach.   4 tablet   3   .  amiodarone (PACERONE) 200 MG tablet  Take 1 tablet (200 mg total) by mouth daily.   30 tablet   3   .  amLODipine (NORVASC) 10 MG tablet  Take 1 tablet (10 mg total) by mouth daily.   30 tablet   3   .  aspirin EC 325 MG tablet  Take 325 mg by mouth daily.         .  folic acid (FOLVITE) 1 MG tablet  Take 1 tablet (1 mg total) by mouth  daily.   30 tablet   3   .  lisinopril (PRINIVIL,ZESTRIL) 2.5 MG tablet  Take 1 tablet (2.5 mg total) by mouth daily.   30 tablet   3   .  Multiple Vitamin (MULTIVITAMIN) tablet  Take 1 tablet by mouth daily.         Marland Kitchen  omeprazole (PRILOSEC) 20 MG capsule  Take 1 capsule (20 mg total) by mouth 2 (two) times daily.   60 capsule   5   .  zolpidem (AMBIEN) 5 MG tablet  Take 0.5 tablets (2.5 mg total) by mouth at bedtime as needed for sleep.   30 tablet   1       No current facility-administered medications for this visit.        No Known Allergies  Past Medical History   Diagnosis  Date   .  Diverticulitis     .  Hypertension     .  Osteoporosis     .  CAD (coronary artery disease)           Past Surgical History   Procedure  Laterality  Date   .  Fracture surgery       .  Partial colectomy             History       Social History   .  Marital Status:  Widowed       Spouse Name:  N/A       Number of Children:  2   .  Years of Education:  13+       Occupational History   .  Retired         Social History Main Topics   .  Smoking status:  Former Smoker       Types:  Cigarettes   .  Smokeless tobacco:  Never Used   .  Alcohol Use:  Yes         Comment: Occasional   .  Drug Use:  No   .  Sexually Active:  No       Other Topics  Concern   .  Not on file       Social History Narrative     Regular exercise-no     Caffeine Use-yes         Family History   Problem  Relation  Age of Onset   .  Hypertension  Other     .  Cancer  Mother         stomach   .  Diabetes  Neg Hx     .  Heart disease  Neg Hx     .  Stroke  Neg Hx          ROS: no fevers or chills, productive cough, hemoptysis, dysphasia, odynophagia, melena, hematochezia, dysuria, hematuria, rash, seizure activity, orthopnea, PND, pedal edema, claudication. Remaining systems are negative.   Physical Exam:    Blood pressure 130/80, pulse 63, weight 128 lb (58.06 kg).   General:   Well developed/well nourished in NAD Skin warm/dry Patient not depressed No peripheral clubbing Back-normal HEENT-normal/normal eyelids Neck supple/normal carotid upstroke bilaterally; no bruits; no JVD; no thyromegaly chest - CTA/ normal expansion CV - RRR/normal S1 and S2; no rubs or gallops;  PMI nondisplaced, 2/6 systolic murmur left sternal border Abdomen -NT/ND, no HSM, no mass, + bowel sounds, no bruit 2+ femoral pulses, no bruits Ext-no edema, chords, 2+ DP Neuro-grossly nonfocal   ECG 08/03/2012-sinus rhythm with anterior T-wave inversion.   Sinus rhythm, rate 62, nonspecific T-wave changes.         Chest heaviness - Lewayne Bunting, MD at 08/18/2012 12:26 PM    Status: Written Related Problem: Chest heaviness           Patient presents with complaints of chest pain. History is very difficult and she does not recall her past medical history very well. Apparently she has had prior PCI. She is also on amiodarone for unclear reasons. Her pain predominantly occurs at night and improves with sitting up. It certainly could be reflux. However given history of coronary disease and ECG changes noted on April 23, I will admit and cycle enzymes. If negative we will plan a functional study tomorrow morning. We need all of her previous records from her previous physicians concerning her cardiac history. I will continue her amiodarone for now  until we receive those records. We will treat with aspirin and DVT prophylaxis heparin unless her enzymes are positive. Continue omeprazole.         Systolic murmur - Lewayne Bunting, MD at 08/18/2012 12:27 PM    Status: Written Related Problem: Systolic murmur           Plan echocardiogram to further assess as an outpatient.         HTN (hypertension) - Lewayne Bunting, MD at 08/18/2012 12:27 PM    Status: Written Related Problem: HTN (hypertension)           Continue present blood pressure medications and monitor.

## 2012-08-18 NOTE — Progress Notes (Signed)
HPI: 79 year old female for evaluation of chest pain. Patient has poor recollection of previous cardiac history. She previously lived in [place]. She is on amiodarone and clear reasons. Her son states that she had angioplasty years ago but I do not have those details. Over the past 2 months she has had intermittent chest pain. It is substernal it occurs at night. It is described as indigestion. It does not radiate and there are no associated symptoms. It improves with sitting up on the side of the bed. She does not have exertional chest pain. She had an episode this morning that awoke her from sleep. It lasted approximately 20 minutes and resolved. She has been placed on omeprazole with no improvement. She has some dyspnea on exertion but no orthopnea, PND or pedal edema. No syncope.  Current Outpatient Prescriptions  Medication Sig Dispense Refill  . alendronate (FOSAMAX) 70 MG tablet Take 1 tablet (70 mg total) by mouth every 7 (seven) days. Take with a full glass of water on an empty stomach.  4 tablet  3  . amiodarone (PACERONE) 200 MG tablet Take 1 tablet (200 mg total) by mouth daily.  30 tablet  3  . amLODipine (NORVASC) 10 MG tablet Take 1 tablet (10 mg total) by mouth daily.  30 tablet  3  . aspirin EC 325 MG tablet Take 325 mg by mouth daily.      . folic acid (FOLVITE) 1 MG tablet Take 1 tablet (1 mg total) by mouth daily.  30 tablet  3  . lisinopril (PRINIVIL,ZESTRIL) 2.5 MG tablet Take 1 tablet (2.5 mg total) by mouth daily.  30 tablet  3  . Multiple Vitamin (MULTIVITAMIN) tablet Take 1 tablet by mouth daily.      Marland Kitchen omeprazole (PRILOSEC) 20 MG capsule Take 1 capsule (20 mg total) by mouth 2 (two) times daily.  60 capsule  5  . zolpidem (AMBIEN) 5 MG tablet Take 0.5 tablets (2.5 mg total) by mouth at bedtime as needed for sleep.  30 tablet  1   No current facility-administered medications for this visit.    No Known Allergies  Past Medical History  Diagnosis Date  .  Diverticulitis   . Hypertension   . Osteoporosis   . CAD (coronary artery disease)     Past Surgical History  Procedure Laterality Date  . Fracture surgery    . Partial colectomy      History   Social History  . Marital Status: Widowed    Spouse Name: N/A    Number of Children: 2  . Years of Education: 13+   Occupational History  . Retired    Social History Main Topics  . Smoking status: Former Smoker    Types: Cigarettes  . Smokeless tobacco: Never Used  . Alcohol Use: Yes     Comment: Occasional  . Drug Use: No  . Sexually Active: No   Other Topics Concern  . Not on file   Social History Narrative   Regular exercise-no   Caffeine Use-yes    Family History  Problem Relation Age of Onset  . Hypertension Other   . Cancer Mother     stomach  . Diabetes Neg Hx   . Heart disease Neg Hx   . Stroke Neg Hx     ROS: no fevers or chills, productive cough, hemoptysis, dysphasia, odynophagia, melena, hematochezia, dysuria, hematuria, rash, seizure activity, orthopnea, PND, pedal edema, claudication. Remaining systems are negative.  Physical Exam:   Blood  pressure 130/80, pulse 63, weight 128 lb (58.06 kg).  General:  Well developed/well nourished in NAD Skin warm/dry Patient not depressed No peripheral clubbing Back-normal HEENT-normal/normal eyelids Neck supple/normal carotid upstroke bilaterally; no bruits; no JVD; no thyromegaly chest - CTA/ normal expansion CV - RRR/normal S1 and S2; no rubs or gallops;  PMI nondisplaced, 2/6 systolic murmur left sternal border Abdomen -NT/ND, no HSM, no mass, + bowel sounds, no bruit 2+ femoral pulses, no bruits Ext-no edema, chords, 2+ DP Neuro-grossly nonfocal  ECG 08/03/2012-sinus rhythm with anterior T-wave inversion.  Sinus rhythm, rate 62, nonspecific T-wave changes.

## 2012-08-19 ENCOUNTER — Observation Stay (HOSPITAL_COMMUNITY): Payer: Medicare HMO

## 2012-08-19 DIAGNOSIS — I2510 Atherosclerotic heart disease of native coronary artery without angina pectoris: Secondary | ICD-10-CM

## 2012-08-19 LAB — TROPONIN I: Troponin I: <0.3 ng/mL (ref <0.30)

## 2012-08-19 MED ORDER — TECHNETIUM TC 99M SESTAMIBI - CARDIOLITE
30.0000 | Freq: Once | INTRAVENOUS | Status: AC | PRN
  Administered 2012-08-19: 10:00:00 30 via INTRAVENOUS

## 2012-08-19 MED ORDER — TECHNETIUM TC 99M SESTAMIBI GENERIC - CARDIOLITE
10.0000 | Freq: Once | INTRAVENOUS | Status: AC | PRN
  Administered 2012-08-19: 10 via INTRAVENOUS

## 2012-08-19 MED ORDER — REGADENOSON 0.4 MG/5ML IV SOLN
0.4000 mg | Freq: Once | INTRAVENOUS | Status: AC
  Administered 2012-08-19: 0.4 mg via INTRAVENOUS
  Filled 2012-08-19: qty 5

## 2012-08-19 NOTE — Progress Notes (Signed)
   Subjective:  Denies CP or dyspnea   Objective:  Filed Vitals:   08/18/12 1941 08/19/12 0500  BP: 130/56 138/59  Pulse: 66 65  Temp: 98.1 F (36.7 C) 97.9 F (36.6 C)  TempSrc: Oral Oral  Resp: 18 18  Weight:  125 lb (56.7 kg)  SpO2: 98% 98%    Intake/Output from previous day:  Intake/Output Summary (Last 24 hours) at 08/19/12 0724 Last data filed at 08/19/12 0558  Gross per 24 hour  Intake    480 ml  Output   1650 ml  Net  -1170 ml    Physical Exam: Physical exam: Well-developed well-nourished in no acute distress.  Skin is warm and dry.  HEENT is normal.  Neck is supple.  Chest is clear to auscultation with normal expansion.  Cardiovascular exam is regular rate and rhythm. 2/6 systolic ejection murmur Abdominal exam nontender or distended. No masses palpated. Extremities show no edema. neuro grossly intact    Lab Results: Basic Metabolic Panel:  Recent Labs  08/65/78 1445  NA 138  K 4.6  CL 105  CO2 25  GLUCOSE 108*  BUN 21  CREATININE 0.90  CALCIUM 9.5   CBC:  Recent Labs  08/18/12 1445  WBC 6.3  NEUTROABS 4.8  HGB 11.9*  HCT 36.0  MCV 97.8  PLT 299   Cardiac Enzymes:  Recent Labs  08/18/12 1441 08/18/12 1911 08/19/12 0127  TROPONINI <0.30 <0.30 <0.30     Assessment/Plan:  1 chest pain-patient has ruled out for myocardial infarction with serial enzymes. Plan Myoview today. If negative we will discharge home. Question pain related to reflux. We will obtain all records [facility] concerning history of coronary disease and why she is on amiodarone. Patient does not know. Continue ASA and protonix. 2 systolic murmur-plan outpatient echocardiogram. 3 hypertension-continue present medications. Blood pressure controlled.   Olga Millers 08/19/2012, 7:24 AM

## 2012-08-19 NOTE — Progress Notes (Signed)
Pt with abnl lexiscan myoview earlier today:  IMPRESSION: This study suggests moderate ischemia in a moderate area affecting the apical anterior segment, apical anteroseptal segment, and the apical cap. There is no definite scar. It is possible that these findings could be related to moving breast attenuation. However this does not appear to be obvious when viewing the raw data.  I discussed her study with Dr. Jens Som and then with patient and family.  Will plan for diagnostic catheterization on Monday.  She is currently pain free.

## 2012-08-20 ENCOUNTER — Other Ambulatory Visit: Payer: Self-pay | Admitting: Internal Medicine

## 2012-08-20 ENCOUNTER — Inpatient Hospital Stay (HOSPITAL_COMMUNITY): Payer: Medicare HMO

## 2012-08-20 ENCOUNTER — Encounter (HOSPITAL_COMMUNITY): Payer: Self-pay | Admitting: *Deleted

## 2012-08-20 NOTE — Progress Notes (Signed)
Subjective:  No chest pain or dyspnea.  Rhythm is NSR.  Awaiting cath Monday.  Objective:  Vital Signs in the last 24 hours: Temp:  [97.6 F (36.4 C)-98.5 F (36.9 C)] 97.7 F (36.5 C) (05/10 0500) Pulse Rate:  [62-65] 65 (05/10 0946) Resp:  [17-18] 18 (05/10 0500) BP: (91-124)/(48-67) 97/67 mmHg (05/10 0946) SpO2:  [95 %-99 %] 95 % (05/10 0500) Weight:  [125 lb (56.7 kg)] 125 lb (56.7 kg) (05/10 0500)  Intake/Output from previous day: 05/09 0701 - 05/10 0700 In: 600 [P.O.:600] Out: 900 [Urine:900] Intake/Output from this shift: Total I/O In: 360 [P.O.:360] Out: -   . amiodarone  200 mg Oral Daily  . amLODipine  10 mg Oral Daily  . aspirin EC  325 mg Oral QPM  . folic acid  1 mg Oral Daily  . lisinopril  2.5 mg Oral Daily  . multivitamin with minerals  1 tablet Oral Daily  . pantoprazole  40 mg Oral Daily  . regadenoson  0.4 mg Intravenous Once  . sodium chloride  3 mL Intravenous Q12H      Physical Exam: The patient appears to be in no distress.  Head and neck exam reveals that the pupils are equal and reactive.  The extraocular movements are full.  There is no scleral icterus.  Mouth and pharynx are benign.  No lymphadenopathy.  No carotid bruits.  The jugular venous pressure is normal.  Thyroid is not enlarged or tender.  Chest is clear to percussion and auscultation.  No rales or rhonchi.  Expansion of the chest is symmetrical.  Heart reveals no abnormal lift or heave.  First and second heart sounds are normal.  There is no  gallop rub or click. Gr2/6 systolic murmur at  base.  The abdomen is soft and nontender.  Bowel sounds are normoactive.  There is no hepatosplenomegaly or mass.  There are no abdominal bruits.  Extremities reveal no phlebitis or edema.  Pedal pulses are good.  There is no cyanosis or clubbing.  Neurologic exam is normal strength and no lateralizing weakness.  No sensory deficits.  Integument reveals no rash  Lab Results:  Recent  Labs  08/18/12 1445  WBC 6.3  HGB 11.9*  PLT 299    Recent Labs  08/18/12 1445  NA 138  K 4.6  CL 105  CO2 25  GLUCOSE 108*  BUN 21  CREATININE 0.90    Recent Labs  08/18/12 1911 08/19/12 0127  TROPONINI <0.30 <0.30   Hepatic Function Panel  Recent Labs  08/18/12 1445  PROT 7.5  ALBUMIN 3.6  AST 20  ALT 13  ALKPHOS 78  BILITOT 0.3   No results found for this basename: CHOL,  in the last 72 hours No results found for this basename: PROTIME,  in the last 72 hours  Imaging: Nm Myocar Multi W/spect W/wall Motion / Ef  08/19/2012  *RADIOLOGY REPORT*  Clinical Data:  There is a history of coronary artery disease and this study is done for further evaluation.  MYOCARDIAL IMAGING WITH SPECT (REST AND PHARMACOLOGIC-STRESS) GATED LEFT VENTRICULAR WALL MOTION STUDY LEFT VENTRICULAR EJECTION FRACTION  Technique:  Standard myocardial SPECT imaging was performed after resting intravenous injection of  mCi Tc-69m . Subsequently, intravenous infusion of  was performed under the supervision of the Cardiology staff.  At peak effect of the drug,  mCi Tc-8m  was injected intravenously and standard myocardial SPECT  imaging was performed.  Quantitative gated imaging was also performed  to evaluate left ventricular wall motion, and estimate left ventricular ejection fraction.  Findings: The patient was stressed with Lexiscan. The raw nuclear data reveals no excess motion.  In addition  I do not see a significant breast   interference.  The tomographic images with stress reveal a moderate area with moderate decreased photon activity affecting the apical cap, apical  anterior segment and apical anteroseptal segment.  The tomographic images at rest revealed normal uptake in all segments.  This suggests ischemia in the abnormal areas.  Wall motion assessment reveals normal wall motion.  The ejection fraction is 65%.  IMPRESSION: This study suggests moderate ischemia in a moderate area affecting the  apical anterior segment, apical anteroseptal segment, and the apical cap.  There is no definite scar.  It is possible that these findings could be related to moving breast attenuation.  However this does not appear to be obvious when viewing the raw data.  Jerral Bonito, MD   Original Report Authenticated By: Willa Rough, M.D.     Cardiac Studies: Telemetry shows NSR Assessment/Plan:  1. Chest pain 2. Systolic heart murmur.  Plan: Continue present meds.          Has not had a recent chest xray. None in epic.  Will get chest xray.  Will also get 2D echo to evaluate aortic murmur further prior to cath Monday.   LOS: 2 days    Cassell Clement 08/20/2012, 12:17 PM

## 2012-08-20 NOTE — Progress Notes (Signed)
Utilization Review Completed.   Renate Danh, RN, BSN Nurse Case Manager  [phone]  

## 2012-08-21 DIAGNOSIS — I359 Nonrheumatic aortic valve disorder, unspecified: Secondary | ICD-10-CM

## 2012-08-21 MED ORDER — SODIUM CHLORIDE 0.9 % IJ SOLN: 3.0000 mL | INTRAMUSCULAR | Status: DC | PRN

## 2012-08-21 MED ORDER — SODIUM CHLORIDE 0.9 % IV SOLN: 1.0000 mL/kg/h | INTRAVENOUS | Status: DC

## 2012-08-21 MED ORDER — ASPIRIN 81 MG PO CHEW
324.0000 mg | CHEWABLE_TABLET | ORAL | Status: AC
  Administered 2012-08-22: 324 mg via ORAL
  Filled 2012-08-21: qty 4

## 2012-08-21 MED ORDER — ASPIRIN 81 MG PO CHEW: 324.0000 mg | CHEWABLE_TABLET | ORAL | Status: DC

## 2012-08-21 MED ORDER — SODIUM CHLORIDE 0.9 % IV SOLN
INTRAVENOUS | Status: DC
Start: 2012-08-22 — End: 2012-08-22
  Administered 2012-08-22: 04:00:00 via INTRAVENOUS

## 2012-08-21 MED ORDER — SODIUM CHLORIDE 0.9 % IV SOLN: 250.0000 mL | INTRAVENOUS | Status: DC | PRN

## 2012-08-21 MED ORDER — SODIUM CHLORIDE 0.9 % IJ SOLN
3.0000 mL | Freq: Two times a day (BID) | INTRAMUSCULAR | Status: DC
  Administered 2012-08-21: 3 mL via INTRAVENOUS

## 2012-08-21 MED ORDER — SODIUM CHLORIDE 0.9 % IJ SOLN: 3.0000 mL | Freq: Two times a day (BID) | INTRAMUSCULAR | Status: DC

## 2012-08-21 MED ORDER — ASPIRIN EC 325 MG PO TBEC
325.0000 mg | DELAYED_RELEASE_TABLET | Freq: Every evening | ORAL | Status: DC
  Administered 2012-08-23 – 2012-08-24 (×2): 325 mg via ORAL
  Filled 2012-08-21 (×3): qty 1

## 2012-08-21 MED ORDER — DIAZEPAM 2 MG PO TABS
2.0000 mg | ORAL_TABLET | ORAL | Status: AC
  Administered 2012-08-22: 2 mg via ORAL
  Filled 2012-08-21: qty 1

## 2012-08-21 MED ORDER — DIAZEPAM 2 MG PO TABS: 2.0000 mg | ORAL_TABLET | ORAL | Status: DC

## 2012-08-21 NOTE — Progress Notes (Signed)
  Echocardiogram 2D Echocardiogram has been performed.  Jorje Guild 08/21/2012, 12:21 PM

## 2012-08-21 NOTE — Progress Notes (Signed)
Patient: Jacqueline [name] Date of Encounter: 08/21/2012, 9:28 AM Admit date: 08/18/2012     Subjective  Denies CP   Objective   Telemetry: NSR Physical Exam: Filed Vitals:   08/21/12 0622  BP: 114/76  Pulse: 69  Temp:   Resp:    General: Well developed, well nourished, in no acute distress. Head: Normocephalic, atraumatic, sclera non-icteric, no xanthomas, nares are without discharge.  Neck: Negative for carotid bruits. JVD not elevated. Lungs: Clear bilaterally to auscultation without wheezes, rales, or rhonchi. Breathing is unlabored. Heart: RRR S1 S2 harsh, grade II-III/VI systolic ejection murmur at base  Abdomen: Soft, non-tender, non-distended with normoactive bowel sounds. No hepatomegaly. No rebound/guarding. No obvious abdominal masses. Msk:  Strength and tone appear normal for age. Extremities: No peripheral edema.   Neuro: Alert and oriented X 3. Moves all extremities spontaneously. Psych:  Responds to questions appropriately with a normal affect.    Intake/Output Summary (Last 24 hours) at 08/21/12 0928 Last data filed at 08/21/12 1914  Gross per 24 hour  Intake    243 ml  Output    800 ml  Net   -557 ml    Inpatient Medications:  . amiodarone  200 mg Oral Daily  . amLODipine  10 mg Oral Daily  . aspirin EC  325 mg Oral QPM  . folic acid  1 mg Oral Daily  . lisinopril  2.5 mg Oral Daily  . multivitamin with minerals  1 tablet Oral Daily  . pantoprazole  40 mg Oral Daily  . regadenoson  0.4 mg Intravenous Once  . sodium chloride  3 mL Intravenous Q12H    Labs:  Recent Labs  08/18/12 1445  NA 138  K 4.6  CL 105  CO2 25  GLUCOSE 108*  BUN 21  CREATININE 0.90  CALCIUM 9.5    Recent Labs  08/18/12 1445  AST 20  ALT 13  ALKPHOS 78  BILITOT 0.3  PROT 7.5  ALBUMIN 3.6   No results found for this basename: LIPASE, AMYLASE,  in the last 72 hours  Recent Labs  08/18/12 1445  WBC 6.3  NEUTROABS 4.8  HGB 11.9*  HCT 36.0  MCV 97.8    PLT 299    Recent Labs  08/18/12 1441 08/18/12 1911 08/19/12 0127  TROPONINI <0.30 <0.30 <0.30   No components found with this basename: POCBNP,  No results found for this basename: DDIMER,  in the last 72 hours No results found for this basename: HGBA1C,  in the last 72 hours No results found for this basename: CHOL, HDL, LDLCALC, TRIG, CHOLHDL,  in the last 72 hours  Recent Labs  08/18/12 1445  TSH 1.617   No results found for this basename: VITAMINB12, FOLATE, FERRITIN, TIBC, IRON, RETICCTPCT,  in the last 72 hours  Radiology/Studies:  Dg Chest 2 View  08/20/2012  *RADIOLOGY REPORT*  Clinical Data: Chest pain  CHEST - 2 VIEW  Comparison: None.  Findings: Aorta is ectatic and unfolded.  Heart size mildly enlarged. Diffusely increased interstitial lung markings noted, without focal pulmonary opacity.  Mild hyperinflation with coarsened pulmonary parenchymal pattern may suggest emphysema.  No focal pulmonary opacity. Bones are osteopenic, which may mask subtle fracture.  No pleural effusion.  IVC filter noted.  IMPRESSION: Prominent lung markings and hyperaeration, likely emphysema.  No focal pulmonary opacity.   Original Report Authenticated By: Christiana Pellant, M.D.    Nm Myocar Multi W/spect W/wall Motion / Ef  08/19/2012  *RADIOLOGY REPORT*  Clinical Data:  There is a history of coronary artery disease and this study is done for further evaluation.  MYOCARDIAL IMAGING WITH SPECT (REST AND PHARMACOLOGIC-STRESS) GATED LEFT VENTRICULAR WALL MOTION STUDY LEFT VENTRICULAR EJECTION FRACTION  Technique:  Standard myocardial SPECT imaging was performed after resting intravenous injection of  mCi Tc-78m . Subsequently, intravenous infusion of  was performed under the supervision of the Cardiology staff.  At peak effect of the drug,  mCi Tc-58m  was injected intravenously and standard myocardial SPECT  imaging was performed.  Quantitative gated imaging was also performed to evaluate left  ventricular wall motion, and estimate left ventricular ejection fraction.  Findings: The patient was stressed with Lexiscan. The raw nuclear data reveals no excess motion.  In addition  I do not see a significant breast   interference.  The tomographic images with stress reveal a moderate area with moderate decreased photon activity affecting the apical cap, apical  anterior segment and apical anteroseptal segment.  The tomographic images at rest revealed normal uptake in all segments.  This suggests ischemia in the abnormal areas.  Wall motion assessment reveals normal wall motion.  The ejection fraction is 65%.  IMPRESSION: This study suggests moderate ischemia in a moderate area affecting the apical anterior segment, apical anteroseptal segment, and the apical cap.  There is no definite scar.  It is possible that these findings could be related to moving breast attenuation.  However this does not appear to be obvious when viewing the raw data.  Jerral Bonito, MD   Original Report Authenticated By: Willa Rough, M.D.      Assessment and Plan  1 CP  - abnormal Lexiscan: moderate anteroapical ischemia;EF 65%  - NL Tns 2 Systolic murmur  - echo ordered 3 HTN  PLAN: cath +/- PCI in AM.  Signed, SERPE, EUGENE PA-C  Agree with above assessment.  Heart murmur unchanged. 2D echo pending. Rhythm sinus bradycardia. Not on BB. Plan cath in am.

## 2012-08-22 ENCOUNTER — Encounter (HOSPITAL_COMMUNITY)
Admission: AD | Disposition: A | Discharge status: Home / Self Care | Payer: Self-pay | Source: Ambulatory Visit | Attending: Surgery

## 2012-08-22 ENCOUNTER — Other Ambulatory Visit: Payer: Self-pay

## 2012-08-22 ENCOUNTER — Inpatient Hospital Stay (HOSPITAL_COMMUNITY): Payer: Medicare HMO

## 2012-08-22 DIAGNOSIS — I2510 Atherosclerotic heart disease of native coronary artery without angina pectoris: Secondary | ICD-10-CM

## 2012-08-22 DIAGNOSIS — Z01810 Encounter for preprocedural cardiovascular examination: Secondary | ICD-10-CM

## 2012-08-22 HISTORY — PX: LEFT HEART CATHETERIZATION WITH CORONARY ANGIOGRAM: SHX5451

## 2012-08-22 LAB — MRSA PCR SCREENING: MRSA by PCR: NEGATIVE

## 2012-08-22 SURGERY — LEFT HEART CATHETERIZATION WITH CORONARY ANGIOGRAM
Anesthesia: LOCAL

## 2012-08-22 MED ORDER — SODIUM CHLORIDE 0.9 % IV SOLN
INTRAVENOUS | Status: DC | PRN
  Administered 2012-08-22 – 2012-08-24 (×2): via INTRAVENOUS

## 2012-08-22 MED ORDER — LIDOCAINE HCL (PF) 1 % IJ SOLN
INTRAMUSCULAR | Status: AC
  Filled 2012-08-22: qty 30

## 2012-08-22 MED ORDER — ALBUTEROL SULFATE (5 MG/ML) 0.5% IN NEBU
2.5000 mg | INHALATION_SOLUTION | Freq: Once | RESPIRATORY_TRACT | Status: AC
  Administered 2012-08-22: 2.5 mg via RESPIRATORY_TRACT

## 2012-08-22 MED ORDER — NITROGLYCERIN IN D5W 200-5 MCG/ML-% IV SOLN
2.0000 ug/min | INTRAVENOUS | Status: DC
  Administered 2012-08-25: 5 ug/min via INTRAVENOUS

## 2012-08-22 MED ORDER — HEPARIN (PORCINE) IN NACL 100-0.45 UNIT/ML-% IJ SOLN
800.0000 [IU]/h | INTRAMUSCULAR | Status: DC
  Administered 2012-08-22: 800 [IU]/h via INTRAVENOUS
  Filled 2012-08-22 (×2): qty 250

## 2012-08-22 MED ORDER — MIDAZOLAM HCL 2 MG/2ML IJ SOLN
INTRAMUSCULAR | Status: AC
  Filled 2012-08-22: qty 2

## 2012-08-22 MED ORDER — SODIUM CHLORIDE 0.9 % IV SOLN: 1.0000 mL/kg/h | INTRAVENOUS | Status: AC

## 2012-08-22 MED ORDER — HEPARIN (PORCINE) IN NACL 2-0.9 UNIT/ML-% IJ SOLN
INTRAMUSCULAR | Status: AC
  Filled 2012-08-22: qty 1000

## 2012-08-22 NOTE — CV Procedure (Signed)
   Cardiac Catheterization Procedure Note  Name: Jacqueline [name] MRN: [id number] DOB: March 06, 1929  Procedure: Left Heart Cath, Selective Coronary Angiography, LV angiography  Indication: 79 year old white female with history remote PCI presents with unstable angina.   Procedural details: The right groin was prepped, draped, and anesthetized with 1% lidocaine. Using modified Seldinger technique, a 5 French sheath was introduced into the right femoral artery. Standard Judkins catheters were used for coronary angiography and left ventriculography. Catheter exchanges were performed over a guidewire. There were no immediate procedural complications. The patient was transferred to the post catheterization recovery area for further monitoring.  Procedural Findings: Hemodynamics:  AO 135/59 with a mean of 90 mmHg LV 140/12 mmHg By pullback there is a less than 5 mm aortic valve gradient.   Coronary angiography: Coronary dominance: right  Left mainstem: The left main coronary is moderately calcified. It tapers to 30% stenosis distally.  Left anterior descending (LAD): The LAD is heavily calcified there is a 95% ostial stenosis followed by a 95% stenosis in the mid vessel.   Left circumflex (LCx): The left circumflex is a large vessel that gives rise to 2 marginal branches distally. In the mid vessel there is a 90% stenosis prior to the bifurcation. The first obtuse marginal vessel and 70% disease proximally.  Right coronary artery (RCA): The right coronary is diffusely diseased in the proximal and mid vessel. There is a 90% stenosis in the proximal vessel followed by a 95% stenosis in the mid vessel. There is TIMI grade 2 flow.  Left ventriculography: Left ventricular systolic function is normal, LVEF is estimated at 55-65%, there is no significant mitral regurgitation   Final Conclusions:   1. Critical three-vessel obstructive coronary disease. 2. Normal LV function.  Recommendations:  Recommend coronary bypass surgery.  Theron Arista [facility] 08/22/2012, 10:05 AM

## 2012-08-22 NOTE — H&P (View-Only) (Signed)
 Patient: Jacqueline [name] Date of Encounter: 08/21/2012, 9:28 AM Admit date: 08/18/2012     Subjective  Denies CP   Objective   Telemetry: NSR Physical Exam: Filed Vitals:   08/21/12 0622  BP: 114/76  Pulse: 69  Temp:   Resp:    General: Well developed, well nourished, in no acute distress. Head: Normocephalic, atraumatic, sclera non-icteric, no xanthomas, nares are without discharge.  Neck: Negative for carotid bruits. JVD not elevated. Lungs: Clear bilaterally to auscultation without wheezes, rales, or rhonchi. Breathing is unlabored. Heart: RRR S1 S2 harsh, grade II-III/VI systolic ejection murmur at base  Abdomen: Soft, non-tender, non-distended with normoactive bowel sounds. No hepatomegaly. No rebound/guarding. No obvious abdominal masses. Msk:  Strength and tone appear normal for age. Extremities: No peripheral edema.   Neuro: Alert and oriented X 3. Moves all extremities spontaneously. Psych:  Responds to questions appropriately with a normal affect.    Intake/Output Summary (Last 24 hours) at 08/21/12 0928 Last data filed at 08/21/12 0622  Gross per 24 hour  Intake    243 ml  Output    800 ml  Net   -557 ml    Inpatient Medications:  . amiodarone  200 mg Oral Daily  . amLODipine  10 mg Oral Daily  . aspirin EC  325 mg Oral QPM  . folic acid  1 mg Oral Daily  . lisinopril  2.5 mg Oral Daily  . multivitamin with minerals  1 tablet Oral Daily  . pantoprazole  40 mg Oral Daily  . regadenoson  0.4 mg Intravenous Once  . sodium chloride  3 mL Intravenous Q12H    Labs:  Recent Labs  08/18/12 1445  NA 138  K 4.6  CL 105  CO2 25  GLUCOSE 108*  BUN 21  CREATININE 0.90  CALCIUM 9.5    Recent Labs  08/18/12 1445  AST 20  ALT 13  ALKPHOS 78  BILITOT 0.3  PROT 7.5  ALBUMIN 3.6   No results found for this basename: LIPASE, AMYLASE,  in the last 72 hours  Recent Labs  08/18/12 1445  WBC 6.3  NEUTROABS 4.8  HGB 11.9*  HCT 36.0  MCV 97.8    PLT 299    Recent Labs  08/18/12 1441 08/18/12 1911 08/19/12 0127  TROPONINI <0.30 <0.30 <0.30   No components found with this basename: POCBNP,  No results found for this basename: DDIMER,  in the last 72 hours No results found for this basename: HGBA1C,  in the last 72 hours No results found for this basename: CHOL, HDL, LDLCALC, TRIG, CHOLHDL,  in the last 72 hours  Recent Labs  08/18/12 1445  TSH 1.617   No results found for this basename: VITAMINB12, FOLATE, FERRITIN, TIBC, IRON, RETICCTPCT,  in the last 72 hours  Radiology/Studies:  Dg Chest 2 View  08/20/2012  *RADIOLOGY REPORT*  Clinical Data: Chest pain  CHEST - 2 VIEW  Comparison: None.  Findings: Aorta is ectatic and unfolded.  Heart size mildly enlarged. Diffusely increased interstitial lung markings noted, without focal pulmonary opacity.  Mild hyperinflation with coarsened pulmonary parenchymal pattern may suggest emphysema.  No focal pulmonary opacity. Bones are osteopenic, which may mask subtle fracture.  No pleural effusion.  IVC filter noted.  IMPRESSION: Prominent lung markings and hyperaeration, likely emphysema.  No focal pulmonary opacity.   Original Report Authenticated By: Gretchen Green, M.D.    Nm Myocar Multi W/spect W/wall Motion / Ef  08/19/2012  *RADIOLOGY REPORT*    Clinical Data:  There is a history of coronary artery disease and this study is done for further evaluation.  MYOCARDIAL IMAGING WITH SPECT (REST AND PHARMACOLOGIC-STRESS) GATED LEFT VENTRICULAR WALL MOTION STUDY LEFT VENTRICULAR EJECTION FRACTION  Technique:  Standard myocardial SPECT imaging was performed after resting intravenous injection of  mCi Tc-99m . Subsequently, intravenous infusion of  was performed under the supervision of the Cardiology staff.  At peak effect of the drug,  mCi Tc-99m  was injected intravenously and standard myocardial SPECT  imaging was performed.  Quantitative gated imaging was also performed to evaluate left  ventricular wall motion, and estimate left ventricular ejection fraction.  Findings: The patient was stressed with Lexiscan. The raw nuclear data reveals no excess motion.  In addition  I do not see a significant breast   interference.  The tomographic images with stress reveal a moderate area with moderate decreased photon activity affecting the apical cap, apical  anterior segment and apical anteroseptal segment.  The tomographic images at rest revealed normal uptake in all segments.  This suggests ischemia in the abnormal areas.  Wall motion assessment reveals normal wall motion.  The ejection fraction is 65%.  IMPRESSION: This study suggests moderate ischemia in a moderate area affecting the apical anterior segment, apical anteroseptal segment, and the apical cap.  There is no definite scar.  It is possible that these findings could be related to moving breast attenuation.  However this does not appear to be obvious when viewing the raw data.  Jeff Katz, MD   Original Report Authenticated By: Jeffrey Katz, M.D.      Assessment and Plan  1 CP  - abnormal Lexiscan: moderate anteroapical ischemia;EF 65%  - NL Tns 2 Systolic murmur  - echo ordered 3 HTN  PLAN: cath +/- PCI in AM.  Signed, SERPE, EUGENE PA-C  Agree with above assessment.  Heart murmur unchanged. 2D echo pending. Rhythm sinus bradycardia. Not on BB. Plan cath in am. 

## 2012-08-22 NOTE — Progress Notes (Addendum)
VASCULAR LAB PRELIMINARY  PRELIMINARY  PRELIMINARY  PRELIMINARY  Pre-op Cardiac Surgery  Carotid Findings:  Bilateral:  No evidence of hemodynamically significant internal carotid artery stenosis.   Vertebral artery flow is antegrade.    Thereasa Parkin, RVT 08/22/2012 4:27 PM    Upper Extremity Right Left  Brachial Pressures 122 Triphasic 109 Triphasic  Radial Waveforms Biphasic Triphasic  Ulnar Waveforms Monophasic Biphasic  Palmar Arch (Allen's Test) Abnormal Normal   Findings:  Doppler waveforms on the right obliterate with radial compression and remain normal with ulnar compression. Left Doppler waveforms remained normal with both radial and ulnar compresions.   [place], [place] RVS 08/24/2012 1340

## 2012-08-22 NOTE — Consult Note (Signed)
[address]       [place] [address]             [phone]      Reason for Consult: Severe multi-vessel coronary disease Referring Physician:  Dr. Peter Swaziland  Jacqueline [name] is an 79 y.o. female.  HPI:   The patient is an 79 year old woman who recently moved to this area  from [facility] with her son and daughter-in-law. She tells me that she's had about a two-week history of intermittent substernal chest discomfort usually occurring at night while lying down that she thought was indigestion. She told her admitting physician that this had been going on for months. She had been placed on omeprazole without resolution. She had an episode on the morning of admission that lasted about 20 minutes and resolved. She ruled out for myocardial infarction. Cardiac catheterization today as noted below shows severe multivessel coronary disease with good left ventricular function. She said that she has had no further chest discomfort since admission last Friday.  Past Medical History  Diagnosis Date  . Diverticulitis   . Hypertension   . Osteoporosis   . CAD (coronary artery disease)     Past Surgical History  Procedure Laterality Date  . Fracture surgery    . Partial colectomy      Family History  Problem Relation Age of Onset  . Hypertension Other   . Cancer Mother     stomach  . Diabetes Neg Hx   . Heart disease Neg Hx   . Stroke Neg Hx     Social History:  reports that she has quit smoking. Her smoking use included Cigarettes. She smoked 0.00 packs per day. She has never used smokeless tobacco. She reports that  drinks alcohol. She reports that she does not use illicit drugs.  Allergies: No Known Allergies  Medications:  I have reviewed the patient's current medications. Prior to Admission:  Prescriptions prior to admission  Medication Sig Dispense Refill  . amiodarone (PACERONE) 200 MG tablet Take 1 tablet (200 mg total) by mouth daily.  30 tablet  3   . aspirin EC 325 MG tablet Take 325 mg by mouth every evening.       . Multiple Vitamin (MULTIVITAMIN) tablet Take 1 tablet by mouth daily.      Marland Kitchen omeprazole (PRILOSEC) 20 MG capsule Take 1 capsule (20 mg total) by mouth 2 (two) times daily.  60 capsule  5  . zolpidem (AMBIEN) 5 MG tablet Take 0.5 tablets (2.5 mg total) by mouth at bedtime as needed for sleep.  30 tablet  1   Scheduled: . amiodarone  200 mg Oral Daily  . amLODipine  10 mg Oral Daily  . [START ON 08/23/2012] aspirin EC  325 mg Oral QPM  . folic acid  1 mg Oral Daily  . lisinopril  2.5 mg Oral Daily  . multivitamin with minerals  1 tablet Oral Daily  . pantoprazole  40 mg Oral Daily   Continuous: . sodium chloride 1 mL/kg/hr (08/22/12 1145)  . heparin    . nitroGLYCERIN     OZH:YQMVHQIONGEXB, ALPRAZolam, nitroGLYCERIN, ondansetron (ZOFRAN) IV, zolpidem Anti-infectives   None      Results for orders placed during the hospital encounter of 08/18/12 (from the past 48 hour(s))  MRSA PCR SCREENING     Status: None   Collection Time    08/22/12 12:04 PM      Result Value Range   MRSA by PCR  NEGATIVE  NEGATIVE   Comment:            The GeneXpert MRSA Assay (FDA     approved for NASAL specimens     only), is one component of a     comprehensive MRSA colonization     surveillance program. It is not     intended to diagnose MRSA     infection nor to guide or     monitor treatment for     MRSA infections.    No results found.  Review of Systems  Constitutional: Negative for fever, chills, weight loss, malaise/fatigue and diaphoresis.  HENT: Negative.   Eyes: Negative.   Respiratory: Positive for shortness of breath. Negative for cough, hemoptysis and sputum production.   Cardiovascular: Positive for chest pain. Negative for palpitations, orthopnea, leg swelling and PND.  Gastrointestinal: Negative.   Genitourinary: Negative.   Musculoskeletal: Positive for joint pain.       Right hip, s/p surgery  Skin:  Negative.   Neurological: Negative.  Negative for weakness.  Endo/Heme/Allergies: Negative.   Psychiatric/Behavioral: Negative.    Blood pressure 119/64, pulse 65, temperature 97.4 F (36.3 C), temperature source Oral, resp. rate 20, height 5\' 6"  (1.676 m), weight 57.4 kg (126 lb 8.7 oz), SpO2 100.00%. Physical Exam  Constitutional: She is oriented to person, place, and time. She appears well-developed and well-nourished. She appears distressed.  HENT:  Head: Normocephalic and atraumatic.  Mouth/Throat: Oropharynx is clear and moist.  Eyes: Conjunctivae and EOM are normal. Pupils are equal, round, and reactive to light.  Neck: Normal range of motion. Neck supple. No JVD present. No thyromegaly present.  Cardiovascular: Normal rate, regular rhythm, normal heart sounds and intact distal pulses.  Exam reveals no gallop and no friction rub.   No murmur heard. Respiratory: Effort normal and breath sounds normal. No respiratory distress. She has no wheezes. She has no rales.  GI: Soft. Bowel sounds are normal. She exhibits no distension and no mass. There is no tenderness.  Musculoskeletal: She exhibits no edema.  Neurological: She is alert and oriented to person, place, and time. She has normal strength. No cranial nerve deficit or sensory deficit.  Skin: Skin is warm and dry.  Psychiatric: She has a normal mood and affect.   [facility]*                *[facility]*                      [address]                     [place], [place] [address]                         [phone]   ------------------------------------------------------------ Transthoracic Echocardiography  Patient:    Camelle, Henkels MR #:       [id number] Study Date: 08/21/2012 Gender:     F Age:        33 Height:     165.1cm Weight:     57.6kg BSA:        1.46m^2 Pt. Status: Room:       3W15C    ORDERING     Cassell Clement  REFERRING    Patty Sermons Julio Sicks,  Arlys John  ATTENDING    Olga Millers  PERFORMING   [facility]  SONOGRAPHER  [place],  RDCS cc:  ------------------------------------------------------------ LV EF: 60% -   65%  ------------------------------------------------------------ Indications:      Murmur 785.2.  ------------------------------------------------------------ History:   PMH:   Murmur.  Risk factors:  GERD. Insomnia. Former tobacco use. Hypertension.  ------------------------------------------------------------ Study Conclusions  - Left ventricle: The cavity size was normal. There was mild   to moderate asymmetric hypertrophy. No systolic anterior   motion of the mitral valve. Systolic function was normal.   The estimated ejection fraction was in the range of 60% to   65%. Wall motion was normal; there were no regional wall   motion abnormalities. - Aortic valve: Mildly to moderately calcified annulus.   Moderately thickened, mildly calcified leaflets. Cusp   separation was moderately reduced. There was mild   stenosis. Valve area: 1.57cm^2(VTI). Valve area: 1.38cm^2   (Vmax). - Mitral valve: Calcified annulus. - Left atrium: The atrium was mildly dilated. - Atrial septum: No defect or patent foramen ovale was   identified. - Pulmonary arteries: PA peak pressure: 33mm Hg (S). Transthoracic echocardiography.  M-mode, complete 2D, spectral Doppler, and color Doppler.  Height:  Height: 165.1cm. Height: 65in.  Weight:  Weight: 57.6kg. Weight: 126.7lb.  Body mass index:  BMI: 21.1kg/m^2.  Body surface area:    BSA: 1.7m^2.  Blood pressure:     114/76.  Patient status:  Inpatient.  Location:  Bedside.  ------------------------------------------------------------  ------------------------------------------------------------ Left ventricle:  The cavity size was normal. There was mild to moderate asymmetric hypertrophy. No systolic anterior motion of the mitral valve. Systolic function was  normal. The estimated ejection fraction was in the range of 60% to 65%. Wall motion was normal; there were no regional wall motion abnormalities.  ------------------------------------------------------------ Aortic valve:   Mildly to moderately calcified annulus. Moderately thickened, mildly calcified leaflets. Cusp separation was moderately reduced.  Doppler:   There was mild stenosis.      VTI ratio of LVOT to aortic valve: 0.5. Valve area: 1.57cm^2(VTI). Indexed valve area: 0.96cm^2/m^2 (VTI). Peak velocity ratio of LVOT to aortic valve: 0.44. Valve area: 1.38cm^2 (Vmax). Indexed valve area: 0.85cm^2/m^2 (Vmax).    Mean gradient: 11mm Hg (S). Peak gradient: 19mm Hg (S).  ------------------------------------------------------------ Aorta:  The aorta was mildly calcified. Aortic root: The aortic root was normal in size. Ascending aorta: The ascending aorta was normal in size. Aortic arch: The aortic arch was normal in size.  ------------------------------------------------------------ Mitral valve:   Calcified annulus. Leaflet separation was normal.  Doppler:  Transvalvular velocity was within the normal range. There was no evidence for stenosis.  Trivial regurgitation.  ------------------------------------------------------------ Left atrium:  The atrium was mildly dilated.  ------------------------------------------------------------ Atrial septum:  No defect or patent foramen ovale was identified.  ------------------------------------------------------------ Right ventricle:  The cavity size was normal. Wall thickness was normal. Systolic function was normal.  ------------------------------------------------------------ Pulmonic valve:    Structurally normal valve.   Cusp separation was normal.  Doppler:  Transvalvular velocity was within the normal range.  No regurgitation.  ------------------------------------------------------------ Tricuspid valve:   Structurally  normal valve.   Leaflet separation was normal.  Doppler:  Transvalvular velocity was within the normal range.  Trivial regurgitation.  ------------------------------------------------------------ Pulmonary artery:   The main pulmonary artery was normal-sized. Systolic pressure was within the normal range.   ------------------------------------------------------------ Right atrium:  The atrium was normal in size.  ------------------------------------------------------------ Pericardium:  There was no pericardial effusion.  ------------------------------------------------------------ Systemic veins: Inferior vena cava: The vessel was normal in size; the respirophasic diameter changes were in the normal range (=  50%); findings are consistent with normal central venous pressure.  ------------------------------------------------------------ Post procedure conclusions Ascending Aorta:  - The aorta was mildly calcified.  ------------------------------------------------------------  2D measurements        Normal  Doppler measurements   Normal Left ventricle                 Main pulmonary LVID ED,   44.6 mm     43-52   artery chord,                         Pressure,    33 mm Hg  =30 PLAX                           S LVID ES,   26.8 mm     23-38   Left ventricle chord,                         Ea, lat    8.88 cm/s   ------ PLAX                           ann, tiss FS, chord,   40 %      >29     DP PLAX                           E/Ea, lat  5.73        ------ LVPW, ED   11.9 mm     ------  ann, tiss IVS/LVPW   0.87        <1.3    DP ratio, ED                      Ea, med    7.24 cm/s   ------ Ventricular septum             ann, tiss IVS, ED    10.4 mm     ------  DP LVOT                           E/Ea, med  7.03        ------ Diam, S      20 mm     ------  ann, tiss Area       3.14 cm^2   ------  DP Aorta                          LVOT Root diam,   33 mm     ------  Peak vel,    96  cm/s   ------ ED                             S Left atrium                    VTI, S       26 cm     ------ AP dim       39 mm     ------  Aortic valve AP dim     2.39 cm/m^2 <2.2    Peak vel,   219 cm/s   ------ index  S Vol, S       56 ml     ------  Mean vel,   153 cm/s   ------ Vol index, 34.4 ml/m^2 ------  S S                              VTI, S     52.1 cm     ------                                Mean         11 mm Hg  ------                                gradient,                                S                                Peak         19 mm Hg  ------                                gradient,                                S                                VTI ratio   0.5        ------                                LVOT/AV                                Area, VTI  1.57 cm^2   ------                                Area index 0.96 cm^2/m ------                                (VTI)           ^2                                Peak vel   0.44        ------                                ratio,                                LVOT/AV  Area, Vmax 1.38 cm^2   ------                                Area index 0.85 cm^2/m ------                                (Vmax)          ^2                                Mitral valve                                Peak E vel 50.9 cm/s   ------                                Peak A vel 91.4 cm/s   ------                                Decelerati  556 ms     150-23                                on time                0                                Peak E/A    0.6        ------                                ratio                                Tricuspid valve                                Regurg      240 cm/s   ------                                peak vel                                Peak RV-RA   23 mm Hg  ------                                gradient,                                 S  Systemic veins                                Estimated    10 mm Hg  ------                                CVP                                Right ventricle                                Pressure,    33 mm Hg  <30                                S                                Sa vel,    12.6 cm/s   ------                                lat ann,                                tiss DP   ------------------------------------------------------------ Prepared and Electronically Authenticated by  Highmore Bing 2014-05-11T19:[id number]  Cardiac Cath: Procedural Findings: Hemodynamics:  AO 135/59 with a mean of 90 mmHg LV 140/12 mmHg By pullback there is a less than 5 mm aortic valve gradient.              Coronary angiography: Coronary dominance: right  Left mainstem: The left main coronary is moderately calcified. It tapers to 30% stenosis distally.  Left anterior descending (LAD): The LAD is heavily calcified there is a 95% ostial stenosis followed by a 95% stenosis in the mid vessel.    Left circumflex (LCx): The left circumflex is a large vessel that gives rise to 2 marginal branches distally. In the mid vessel there is a 90% stenosis prior to the bifurcation. The first obtuse marginal vessel and 70% disease proximally.  Right coronary artery (RCA): The right coronary is diffusely diseased in the proximal and mid vessel. There is a 90% stenosis in the proximal vessel followed by a 95% stenosis in the mid vessel. There is TIMI grade 2 flow.  Left ventriculography: Left ventricular systolic function is normal, LVEF is estimated at 55-65%, there is no significant mitral regurgitation   Final Conclusions:   1. Critical three-vessel obstructive coronary disease. 2. Normal LV function.  Recommendations: Recommend coronary bypass surgery.  Theron Arista [facility] 08/22/2012, 10:05 AM  Assessment/Plan:  She has severe multivessel coronary  disease with high-grade stenoses in the LAD and right coronary territories. She has mild aortic stenosis by 2-D echocardiogram and catheterization with a pullback gradient of less than 5 mm mercury. I agree that coronary bypass graft surgery is the best treatment to prevent further ischemia and infarction and improve her quality of life. Her son has already left for the day since he has to work [employer]. She would like to  wait until Thursday to do surgery so I can talk with her son and her daughter can come in from out of town. I discussed the operative procedure with the patient and family including alternatives, benefits and risks; including but not limited to bleeding, blood transfusion, infection, stroke, myocardial infarction, graft failure, heart block requiring a permanent pacemaker, organ dysfunction, and death.  Lamyah Creed understands and agrees to proceed.  We will schedule surgery for Thursday.  BARTLE,BRYAN K 08/22/2012, 6:04 PM

## 2012-08-22 NOTE — Consult Note (Signed)
ANTICOAGULATION CONSULT NOTE - Initial Consult  Pharmacy Consult for Heparin Indication: severe 3v CAD  No Known Allergies  Patient Measurements: Height: 5\' 6"  (167.6 cm) Weight: 126 lb 8.7 oz (57.4 kg) IBW/kg (Calculated) : 59.3 Heparin Dosing Weight: 57kg  Vital Signs: Temp: 98 F (36.7 C) (05/12 1145) Temp src: Oral (05/12 1145) BP: 126/64 mmHg (05/12 1145) Pulse Rate: 59 (05/12 1145)  Labs: No results found for this basename: HGB, HCT, PLT, APTT, LABPROT, INR, HEPARINUNFRC, CREATININE, CKTOTAL, CKMB, TROPONINI,  in the last 72 hours  Estimated Creatinine Clearance: 44.4 ml/min (by C-G formula based on Cr of 0.9).   Medical History: Past Medical History  Diagnosis Date  . Diverticulitis   . Hypertension   . Osteoporosis   . CAD (coronary artery disease)    Assessment: 83yof s/p cath found to have severe 3 vessel CAD. She will begin heparin while awaiting CVTS evaluation for CABG. Baseline labs wnl.  Heparin to begin 8 hours after sheath removal. Sheath removal was at 1025.  Goal of Therapy:  Heparin level 0.3-0.7 units/ml Monitor platelets by anticoagulation protocol: Yes   Plan:  1) At 1825 tonight, begin heparin @ 800 units/hr with NO bolus 2) Check 8 hour heparin level 3) Daily heparin level and CBC 4) Follow up plans for CABG  Fredrik Rigger 08/22/2012,11:52 AM

## 2012-08-22 NOTE — Interval H&P Note (Signed)
History and Physical Interval Note:  08/22/2012 9:37 AM  Jacqueline [name]  has presented today for surgery, with the diagnosis of Chest pain  The various methods of treatment have been discussed with the patient and family. After consideration of risks, benefits and other options for treatment, the patient has consented to  Procedure(s): LEFT HEART CATHETERIZATION WITH CORONARY ANGIOGRAM (N/A) as a surgical intervention .  The patient's history has been reviewed, patient examined, no change in status, stable for surgery.  I have reviewed the patient's chart and labs.  Questions were answered to the patient's satisfaction.     Theron Arista [facility] 08/22/2012 9:37 AM

## 2012-08-22 NOTE — Progress Notes (Signed)
Pt arrived from cath lab at approximately 1145. Pt resting comfortably in bed, denies pain or discomfort. Reviewed importance of bed rest and notifying staff if bleeding or swelling at right groin site was noticed. All questions answered. Oriented pt to 2900 environment and use of call light. Reinforced importance of notifying staff of needs or assistance and not to get oob without staff assistance. Pt informed this staff that her personal belongings were still in room 3015. This staff called 3000 and the secretary stated they will transport her belongings to 2900. Assessment and vitals as documented. Monitoring closely.

## 2012-08-23 ENCOUNTER — Telehealth: Payer: Self-pay | Admitting: Family Medicine

## 2012-08-23 DIAGNOSIS — I2510 Atherosclerotic heart disease of native coronary artery without angina pectoris: Principal | ICD-10-CM

## 2012-08-23 LAB — CBC
Hemoglobin: 11.5 g/dL — ABNORMAL LOW (ref 12.0–15.0)
MCH: 32.5 pg (ref 26.0–34.0)
MCHC: 33.1 g/dL (ref 30.0–36.0)
Platelets: 280 10*3/uL (ref 150–400)
RDW: 15.6 % — ABNORMAL HIGH (ref 11.5–15.5)

## 2012-08-23 LAB — HEPARIN LEVEL (UNFRACTIONATED)
Heparin Unfractionated: 0.18 IU/mL — ABNORMAL LOW (ref 0.30–0.70)
Heparin Unfractionated: 0.33 IU/mL (ref 0.30–0.70)

## 2012-08-23 MED ORDER — HEPARIN (PORCINE) IN NACL 100-0.45 UNIT/ML-% IJ SOLN
1000.0000 [IU]/h | INTRAMUSCULAR | Status: DC
  Administered 2012-08-23 – 2012-08-24 (×2): 1000 [IU]/h via INTRAVENOUS
  Filled 2012-08-23 (×2): qty 250

## 2012-08-23 MED ORDER — ATORVASTATIN CALCIUM 40 MG PO TABS
40.0000 mg | ORAL_TABLET | Freq: Every day | ORAL | Status: DC
  Administered 2012-08-23 – 2012-08-24 (×2): 40 mg via ORAL
  Filled 2012-08-23 (×3): qty 1

## 2012-08-23 NOTE — Progress Notes (Signed)
ANTICOAGULATION CONSULT NOTE - Follow Up Consult  Pharmacy Consult for Heparin Indication: severe 3v CAD  No Known Allergies  Patient Measurements: Height: 5\' 6"  (167.6 cm) Weight: 125 lb 14.1 oz (57.1 kg) IBW/kg (Calculated) : 59.3 Heparin Dosing Weight: 57kg  Vital Signs: Temp: 98.4 F (36.9 C) (05/13 1146) Temp src: Oral (05/13 1146) BP: 110/47 mmHg (05/13 1146) Pulse Rate: 60 (05/13 1146)  Labs:  Recent Labs  08/23/12 0217 08/23/12 0500  HGB  --  11.5*  HCT  --  34.7*  PLT  --  280  HEPARINUNFRC 0.18*  --     Estimated Creatinine Clearance: 44.2 ml/min (by C-G formula based on Cr of 0.9).   Medications:  Heparin @ 1000 units/hr  Assessment: Jacqueline [name] continues on heparin for severe 3 vessel CAD awaiting CABG on Thursday. Reported heparin level is therapeutic at 0.33 (not in EPIC yet, lab says system is down). CBC is stable. No bleeding reported.  Goal of Therapy:  Heparin level 0.3-0.7 units/ml Monitor platelets by anticoagulation protocol: Yes   Plan:  1) Continue heparin at 1000 units/hr 2) Follow up heparin level, CBC in AM  Fredrik Rigger 08/23/2012,1:14 PM

## 2012-08-23 NOTE — Progress Notes (Signed)
CARDIAC REHAB PHASE I   PRE:  Rate/Rhythm: 67 SR  BP:  Supine:   Sitting: 101/49  Standing:    SaO2: 99 2L 96 RA  MODE:  Ambulation: 350 ft   POST:  Rate/Rhythm: 81 SR  BP:  Supine:  Sitting: 118/67  Standing:    SaO2: 99 RA 1220-1310  Assisted X 1 and used walker to ambulate. Gait steady with walker. Pt used cane at home only outside her home. VS stable pt back to recliner after walk with call light in reach. Started pre-op education with pt and family. Discussed IS, sternal precautions and walking post-op. Pt voices understanding. She states that she lives with her son and that her daughter in law will be able to provide 24 hour care for her at discharge. Gave pt preparing for heart surgery booklet and encouraged her to watch pre-op video.  Melina Copa RN 08/23/2012 2:35 PM

## 2012-08-23 NOTE — Progress Notes (Signed)
   Subjective:  Denies CP or dyspnea   Objective:  Filed Vitals:   08/23/12 0000 08/23/12 0013 08/23/12 0336 08/23/12 0400  BP: 111/56   117/43  Pulse:      Temp:  97.5 F (36.4 C) 98.1 F (36.7 C)   TempSrc:  Oral Oral   Resp: 21  19 24   Height:      Weight:   125 lb 14.1 oz (57.1 kg)   SpO2: 100%   99%    Intake/Output from previous day:  Intake/Output Summary (Last 24 hours) at 08/23/12 5621 Last data filed at 08/23/12 0600  Gross per 24 hour  Intake 1020.36 ml  Output   1725 ml  Net -704.64 ml    Physical Exam: Physical exam: Well-developed well-nourished in no acute distress.  Skin is warm and dry.  HEENT is normal.  Neck is supple.  Chest is clear to auscultation with normal expansion.  Cardiovascular exam is regular rate and rhythm. 2/6 systolic ejection murmur Abdominal exam nontender or distended. No masses palpated. Right groin with no hematoma and no bruit Extremities show no edema. neuro grossly intact    Lab Results: CBC:  Recent Labs  08/23/12 0500  WBC 7.3  HGB 11.5*  HCT 34.7*  MCV 98.0  PLT 280    Assessment/Plan:  1 chest pain-Cath reveals 3 vessel CAD and preserved LV function; for CABG Thurs. Continue ASA and heparin. Add statin. 2 Mild AS - will need fu echos in the future. 3 hypertension-continue present medications.   Olga Millers 08/23/2012, 7:28 AM

## 2012-08-23 NOTE — Telephone Encounter (Signed)
Pt daughter in-law called for Jacqueline [name] to cancel her appointment for tomorrow with dr Beverely Low. Pt is currently in the hospital and will re-schedule once she is out. Thanks.

## 2012-08-23 NOTE — Progress Notes (Signed)
ANTICOAGULATION CONSULT NOTE - Follow Up Consult    HL = 0.18 (goal 0.3 - 0.7 units/mL) Heparin dosing weight = 57 kg   Assessment: 83 YOF with severe 3-vessels disease to continue on IV heparin while awaiting CABG.  Heparin level sub-therapeutic.  No complications with infusion per RN.  No bleeding reported either.   Plan: - Increase heparin gtt to 1000 units/hr, no bolus s/p cath - Check 8 hr HL     Jacqueline [name], PharmD, BCPS Pager:  [phone] 08/23/2012, 3:32 AM

## 2012-08-23 NOTE — Telephone Encounter (Signed)
Noted  

## 2012-08-24 ENCOUNTER — Ambulatory Visit: Payer: Medicare PPO | Admitting: Family Medicine

## 2012-08-24 DIAGNOSIS — Z01810 Encounter for preprocedural cardiovascular examination: Secondary | ICD-10-CM

## 2012-08-24 LAB — BASIC METABOLIC PANEL
Calcium: 9.2 mg/dL (ref 8.4–10.5)
GFR calc Af Amer: 89 mL/min — ABNORMAL LOW (ref >90)
GFR calc non Af Amer: 77 mL/min — ABNORMAL LOW (ref >90)
Glucose, Bld: 100 mg/dL — ABNORMAL HIGH (ref 70–99)
Sodium: 137 mEq/L (ref 135–145)

## 2012-08-24 LAB — CBC
HCT: 32 % — ABNORMAL LOW (ref 36.0–46.0)
MCHC: 33.4 g/dL (ref 30.0–36.0)
Platelets: 259 10*3/uL (ref 150–400)
RDW: 15.6 % — ABNORMAL HIGH (ref 11.5–15.5)
WBC: 5.5 10*3/uL (ref 4.0–10.5)

## 2012-08-24 LAB — ABO/RH: ABO/RH(D): A NEG

## 2012-08-24 MED ORDER — TEMAZEPAM 15 MG PO CAPS: 15.0000 mg | ORAL_CAPSULE | Freq: Once | ORAL | Status: AC | PRN

## 2012-08-24 MED ORDER — SODIUM CHLORIDE 0.9 % IV SOLN
INTRAVENOUS | Status: DC
  Filled 2012-08-24: qty 30

## 2012-08-24 MED ORDER — BISACODYL 5 MG PO TBEC
5.0000 mg | DELAYED_RELEASE_TABLET | Freq: Once | ORAL | Status: DC
  Filled 2012-08-24: qty 1

## 2012-08-24 MED ORDER — PHENYLEPHRINE HCL 10 MG/ML IJ SOLN
30.0000 ug/min | INTRAVENOUS | Status: AC
  Administered 2012-08-25: 25 ug/min via INTRAVENOUS
  Filled 2012-08-24: qty 2

## 2012-08-24 MED ORDER — BISACODYL 5 MG PO TBEC: 5.0000 mg | DELAYED_RELEASE_TABLET | Freq: Once | ORAL | Status: DC

## 2012-08-24 MED ORDER — VANCOMYCIN HCL 10 G IV SOLR
1250.0000 mg | INTRAVENOUS | Status: AC
  Administered 2012-08-25: 1250 mg via INTRAVENOUS
  Filled 2012-08-24: qty 1250

## 2012-08-24 MED ORDER — SODIUM CHLORIDE 0.9 % IV SOLN
INTRAVENOUS | Status: AC
  Administered 2012-08-25: 1.3 [IU]/h via INTRAVENOUS
  Filled 2012-08-24: qty 1

## 2012-08-24 MED ORDER — DEXMEDETOMIDINE HCL IN NACL 400 MCG/100ML IV SOLN
0.1000 ug/kg/h | INTRAVENOUS | Status: AC
  Administered 2012-08-25: 0.2 ug/kg/h via INTRAVENOUS
  Filled 2012-08-24: qty 100

## 2012-08-24 MED ORDER — EPINEPHRINE HCL 1 MG/ML IJ SOLN
0.5000 ug/min | INTRAVENOUS | Status: DC
  Filled 2012-08-24: qty 4

## 2012-08-24 MED ORDER — PLASMA-LYTE 148 IV SOLN
INTRAVENOUS | Status: AC
  Administered 2012-08-25: 09:00:00
  Filled 2012-08-24: qty 2.5

## 2012-08-24 MED ORDER — POTASSIUM CHLORIDE 2 MEQ/ML IV SOLN
80.0000 meq | INTRAVENOUS | Status: DC
  Filled 2012-08-24: qty 40

## 2012-08-24 MED ORDER — DEXTROSE 5 % IV SOLN
750.0000 mg | INTRAVENOUS | Status: DC
  Filled 2012-08-24 (×2): qty 750

## 2012-08-24 MED ORDER — METOPROLOL TARTRATE 12.5 MG HALF TABLET
12.5000 mg | ORAL_TABLET | Freq: Once | ORAL | Status: AC
  Administered 2012-08-25: 12.5 mg via ORAL
  Filled 2012-08-24: qty 1

## 2012-08-24 MED ORDER — DIAZEPAM 2 MG PO TABS
2.0000 mg | ORAL_TABLET | Freq: Once | ORAL | Status: AC
  Administered 2012-08-25: 2 mg via ORAL
  Filled 2012-08-24: qty 1

## 2012-08-24 MED ORDER — CHLORHEXIDINE GLUCONATE 4 % EX LIQD
60.0000 mL | Freq: Once | CUTANEOUS | Status: AC
  Administered 2012-08-25: 4 via TOPICAL
  Filled 2012-08-24: qty 60

## 2012-08-24 MED ORDER — MAGNESIUM SULFATE 50 % IJ SOLN
40.0000 meq | INTRAMUSCULAR | Status: DC
  Filled 2012-08-24: qty 10

## 2012-08-24 MED ORDER — SODIUM CHLORIDE 0.9 % IV SOLN
INTRAVENOUS | Status: AC
  Administered 2012-08-25: 70 mL/h via INTRAVENOUS
  Filled 2012-08-24: qty 40

## 2012-08-24 MED ORDER — CEFUROXIME SODIUM 1.5 G IJ SOLR
1.5000 g | INTRAMUSCULAR | Status: AC
  Administered 2012-08-25: .75 g via INTRAVENOUS
  Administered 2012-08-25: 1.5 g via INTRAVENOUS
  Filled 2012-08-24: qty 1.5

## 2012-08-24 MED ORDER — NITROGLYCERIN IN D5W 200-5 MCG/ML-% IV SOLN
2.0000 ug/min | INTRAVENOUS | Status: DC
  Filled 2012-08-24: qty 250

## 2012-08-24 MED ORDER — CHLORHEXIDINE GLUCONATE 4 % EX LIQD
60.0000 mL | Freq: Once | CUTANEOUS | Status: AC
  Administered 2012-08-24: 4 via TOPICAL
  Filled 2012-08-24: qty 15

## 2012-08-24 MED ORDER — DOPAMINE-DEXTROSE 3.2-5 MG/ML-% IV SOLN
2.0000 ug/kg/min | INTRAVENOUS | Status: DC
  Filled 2012-08-24: qty 250

## 2012-08-24 MED ORDER — ALPRAZOLAM 0.25 MG PO TABS: 0.2500 mg | ORAL_TABLET | Freq: Three times a day (TID) | ORAL | Status: DC | PRN

## 2012-08-24 NOTE — Progress Notes (Signed)
   Subjective:  Chest pain earlier relieved with NTG; no dyspnea   Objective:  Filed Vitals:   08/23/12 2300 08/24/12 0320 08/24/12 0339 08/24/12 0500  BP: 112/57  100/59   Pulse:      Temp: 98.7 F (37.1 C)  98 F (36.7 C)   TempSrc: Oral  Oral   Resp: 21 21 16    Height:      Weight:    125 lb 7.1 oz (56.9 kg)  SpO2: 95%  94%     Intake/Output from previous day:  Intake/Output Summary (Last 24 hours) at 08/24/12 0724 Last data filed at 08/24/12 0700  Gross per 24 hour  Intake   1030 ml  Output   1730 ml  Net   -700 ml    Physical Exam: Physical exam: Well-developed well-nourished in no acute distress.  Skin is warm and dry.  HEENT is normal.  Neck is supple.  Chest is clear to auscultation with normal expansion.  Cardiovascular exam is regular rate and rhythm. 2/6 systolic ejection murmur Abdominal exam nontender or distended. No masses palpated. Right groin with no hematoma and no bruit Extremities show no edema. neuro grossly intact    Lab Results: CBC:  Recent Labs  08/23/12 0500 08/24/12 0425  WBC 7.3 5.5  HGB 11.5* 10.7*  HCT 34.7* 32.0*  MCV 98.0 97.0  PLT 280 259    Assessment/Plan:  1 chest pain-Cath reveals 3 vessel CAD and preserved LV function; for CABG Thurs. Continue ASA, statin and heparin. Brief CP this AM; if recurs, add IV NTG. 2 Mild AS - will need fu echos in the future. 3 hypertension-continue present medications.   Olga Millers 08/24/2012, 7:24 AM

## 2012-08-24 NOTE — Progress Notes (Signed)
CARDIAC REHAB PHASE I   PRE:  Rate/Rhythm: 65 SR  BP:  Supine:   Sitting: 97/46  Standing:    SaO2: 97 RA  MODE:  Ambulation: 700 ft   POST:  Rate/Rhythm: 72 DR  BP:  Supine:   Sitting: 113/55   Standing:    SaO2: 98 RA 1355-1430 Assisted X 1 and used walker to ambulate. Gait steady with walker, slow pace. Pt walked 700 feet without c/o of cp or SOB. VS stable. Pt back to recliner after walk with call light in reach.   Melina Copa RN 08/24/2012 2:26 PM

## 2012-08-24 NOTE — Care Management Note (Addendum)
    Page 1 of 2   09/01/2012     3:51:14 PM   CARE MANAGEMENT NOTE 09/01/2012  Patient:  Hovsepian,Kebrina   Account Number:  [id number]  Date Initiated:  08/22/2012  Documentation initiated by:  Junius Creamer  Subjective/Objective Assessment:   adm w ch pain     Action/Plan:   lives w fam, pcp dr Natalia Leatherwood tabori   Anticipated DC Date:  09/02/2012   Anticipated DC Plan:  HOME W HOME HEALTH SERVICES      DC Planning Services  CM consult      [facility] Choice  HOME HEALTH   Choice offered to / List presented to:  C-1 Patient   DME arranged  BEDSIDE COMMODE      DME agency  [facility]     [facility] arranged  HH-1 RN  HH-2 PT  HH-3 OT      Status of service:  In process, will continue to follow Medicare Important Message given?   (If response is "NO", the following Medicare IM given date fields will be blank) Date Medicare IM given:   Date Additional Medicare IM given:    Discharge Disposition:  HOME W HOME HEALTH SERVICES  Per UR Regulation:  Reviewed for med. necessity/level of care/duration of stay  If discussed at Long Length of Stay Meetings, dates discussed:   08/25/2012  08/30/2012    Comments:  09/01/12 Ferry Matthis,RN,BSN [phone] PT FOR DC HOME TOMORROW 5/23.  WILL NEED HH FOLLOW UP.  PT IS AGREEABLE TO HH SET UP.  WILL CHECK [employer]  PROVIDERS AND FOLLOW UP WITH PT.  [place] PROVIDER LIST GIVEN TO PT.  START OF CARE 24-48H POST DC DATE.  3 IN 1 ORDERED FOR HOME,  PER REQUEST.  08/30/12 Emilo Gras,RN,BSN [phone] PT TO DC HOME WITH SON AND DTR IN LAW. PROGRESSING WELL. CONT TO FOLLOW.   5/14 1457 debbie dowell rn,bsn pt lives w son. da in law will be w pt for 10-14 days post disch. pt ambul w card rehab 751ft today.

## 2012-08-24 NOTE — Progress Notes (Signed)
2 Days Post-Op Procedure(s) (LRB): LEFT HEART CATHETERIZATION WITH CORONARY ANGIOGRAM (N/A) Subjective:  Had brief chest pain that she calls indigestion this am. She is on heparin.  Objective: Vital signs in last 24 hours: Temp:  [98 F (36.7 C)-98.7 F (37.1 C)] 98 F (36.7 C) (05/14 0744) Pulse Rate:  [60-68] 68 (05/14 0744) Cardiac Rhythm:  [-] Normal sinus rhythm (05/14 0744) Resp:  [16-28] 19 (05/14 0744) BP: (94-129)/(47-60) 129/60 mmHg (05/14 0744) SpO2:  [94 %-99 %] 96 % (05/14 0744) Weight:  [125 lb 7.1 oz (56.9 kg)] 125 lb 7.1 oz (56.9 kg) (05/14 0500)  Hemodynamic parameters for last 24 hours:    Intake/Output from previous day: 05/13 0701 - 05/14 0700 In: 1040 [P.O.:680; I.V.:360] Out: 1730 [Urine:1730] Intake/Output this shift: Total I/O In: 620 [P.O.:600; I.V.:20] Out: 600 [Urine:600]  General appearance: alert and cooperative Neurologic: intact Heart: regular rate and rhythm, S1, S2 normal, no murmur, click, rub or gallop Lungs: clear to auscultation bilaterally  Lab Results:  Recent Labs  08/23/12 0500 08/24/12 0425  WBC 7.3 5.5  HGB 11.5* 10.7*  HCT 34.7* 32.0*  PLT 280 259   BMET:  Recent Labs  08/24/12 0425  NA 137  K 4.3  CL 105  CO2 24  GLUCOSE 100*  BUN 16  CREATININE 0.77  CALCIUM 9.2    PT/INR: No results found for this basename: LABPROT, INR,  in the last 72 hours ABG No results found for this basename: phart, pco2, po2, hco3, tco2, acidbasedef, o2sat   CBG (last 3)  No results found for this basename: GLUCAP,  in the last 72 hours  Assessment/Plan: S/P Procedure(s) (LRB): LEFT HEART CATHETERIZATION WITH CORONARY ANGIOGRAM (N/A)  Severe Multi-vessel coronary disease. Plan CABG in am. I discussed the operative procedure with the patient and son and daughter-in-law yesterday including alternatives, benefits and risks; including but not limited to bleeding, blood transfusion, infection, stroke, myocardial infarction, graft  failure, heart block requiring a permanent pacemaker, organ dysfunction, and death.  Carry Weesner understands and agrees to proceed.    LOS: 6 days    BARTLE,BRYAN K 08/24/2012

## 2012-08-24 NOTE — Progress Notes (Signed)
Pt had Chest Pain 4/10. Nitro given sublingual X1. Pain 0/10 after first dose nitro. EKG done.Dr [place] Callas notified oncall.

## 2012-08-24 NOTE — Progress Notes (Signed)
ANTICOAGULATION CONSULT NOTE - Follow Up Consult  Pharmacy Consult for Heparin  Indication: 3VCAD awaiting CABG on Thursday, 08/25/12  No Known Allergies  Patient Measurements: Height: 5\' 6"  (167.6 cm) Weight: 125 lb 14.1 oz (57.1 kg) IBW/kg (Calculated) : 59.3 Heparin Dosing Weight: 57 kg  Vital Signs: Temp: 98 F (36.7 C) (05/14 1126) Temp src: Oral (05/14 1126) BP: 100/49 mmHg (05/14 1126) Pulse Rate: 62 (05/14 1126)  Labs:  Recent Labs  08/23/12 0217 08/23/12 0500 08/23/12 1125 08/24/12 0425  HGB  --  11.5*  --  10.7*  HCT  --  34.7*  --  32.0*  PLT  --  280  --  259  HEPARINUNFRC 0.18*  --  0.33 0.35  CREATININE  --   --   --  0.77    Estimated Creatinine Clearance: 49.7 ml/min (by C-G formula based on Cr of 0.77).   Medications:  Heparin @ 1000 units/hr (10 ml/hr)  Assessment: 79 y.o. F on heparin for 3VCAD while awaiting CABG planned for Thursday, 08/25/12. Heparin level this morning is therapeutic (HL 0.35 << 0.33, goal of 0.3-0.7). Hgb/Hct/Plt slight drop. No s/sx of bleeding noted at this time.   Per Dr. Laneta Simmers, Heparin is to be turned off on call to the OR tomorrow. The patient is scheduled for surgery at 0730 -- will set a default stop time of 0700 for now and discuss with the nurse the appropriate stop time.   Goal of Therapy:  Heparin level 0.3-0.7 units/ml Monitor platelets by anticoagulation protocol: Yes   Plan:  1. Continue heparin at current rate of 1000 units/hr (10 ml/hr) 2. Per MD request, will turn heparin off on call to the OR on 5/15 3. Will f/u with monitoring post-OHS  Georgina Pillion, PharmD, BCPS Clinical Pharmacist Pager: [phone] 08/24/2012 1:20 PM

## 2012-08-25 ENCOUNTER — Inpatient Hospital Stay (HOSPITAL_COMMUNITY): Payer: Medicare HMO

## 2012-08-25 ENCOUNTER — Encounter (HOSPITAL_COMMUNITY)
Admission: AD | Disposition: A | Discharge status: Home / Self Care | Payer: Self-pay | Source: Ambulatory Visit | Attending: Surgery

## 2012-08-25 ENCOUNTER — Encounter (HOSPITAL_COMMUNITY): Payer: Self-pay | Admitting: Certified Registered"

## 2012-08-25 ENCOUNTER — Inpatient Hospital Stay (HOSPITAL_COMMUNITY): Payer: Medicare HMO | Admitting: Anesthesiology

## 2012-08-25 ENCOUNTER — Encounter (HOSPITAL_COMMUNITY): Payer: Self-pay | Admitting: Anesthesiology

## 2012-08-25 DIAGNOSIS — I2510 Atherosclerotic heart disease of native coronary artery without angina pectoris: Secondary | ICD-10-CM

## 2012-08-25 HISTORY — PX: CORONARY ARTERY BYPASS GRAFT: SHX141

## 2012-08-25 LAB — POCT I-STAT, CHEM 8
BUN: 10 mg/dL (ref 6–23)
Calcium, Ion: 1 mmol/L — ABNORMAL LOW (ref 1.13–1.30)
Hemoglobin: 10.9 g/dL — ABNORMAL LOW (ref 12.0–15.0)
TCO2: 22 mmol/L (ref 0–100)

## 2012-08-25 LAB — POCT I-STAT 3, ART BLOOD GAS (G3+)
Acid-base deficit: 2 mmol/L (ref 0.0–2.0)
Bicarbonate: 22.8 mEq/L (ref 20.0–24.0)
Bicarbonate: 22.3 mEq/L (ref 20.0–24.0)
O2 Saturation: 100 %
O2 Saturation: 100 %
O2 Saturation: 98 %
O2 Saturation: 97 %
Patient temperature: 33
Patient temperature: 33
Patient temperature: 36
Patient temperature: 36.2
Patient temperature: 36.3
TCO2: 24 mmol/L (ref 0–100)
TCO2: 26 mmol/L (ref 0–100)
TCO2: 25 mmol/L (ref 0–100)
TCO2: 24 mmol/L (ref 0–100)
TCO2: 18 mmol/L (ref 0–100)
pCO2 arterial: 38.3 mmHg (ref 35.0–45.0)
pH, Arterial: 7.377 (ref 7.350–7.450)
pH, Arterial: 7.383 (ref 7.350–7.450)
pH, Arterial: 7.326 — ABNORMAL LOW (ref 7.350–7.450)
pO2, Arterial: 267 mmHg — ABNORMAL HIGH (ref 80.0–100.0)
pO2, Arterial: 36 mmHg — CL (ref 80.0–100.0)
pO2, Arterial: 253 mmHg — ABNORMAL HIGH (ref 80.0–100.0)
pO2, Arterial: 86 mmHg (ref 80.0–100.0)

## 2012-08-25 LAB — BLOOD GAS, ARTERIAL
Acid-Base Excess: 0.7 mmol/L (ref 0.0–2.0)
Bicarbonate: 24.2 mEq/L — ABNORMAL HIGH (ref 20.0–24.0)
Drawn by: 36496
O2 Saturation: 99.1 %
TCO2: 25.3 mmol/L (ref 0–100)
pO2, Arterial: 88.1 mmHg (ref 80.0–100.0)

## 2012-08-25 LAB — BASIC METABOLIC PANEL
BUN: 14 mg/dL (ref 6–23)
CO2: 22 mEq/L (ref 19–32)
Calcium: 9.2 mg/dL (ref 8.4–10.5)
GFR calc non Af Amer: 77 mL/min — ABNORMAL LOW (ref >90)
Glucose, Bld: 94 mg/dL (ref 70–99)
Potassium: 4 mEq/L (ref 3.5–5.1)
Sodium: 140 mEq/L (ref 135–145)

## 2012-08-25 LAB — POCT I-STAT 4, (NA,K, GLUC, HGB,HCT)
HCT: 31 % — ABNORMAL LOW (ref 36.0–46.0)
HCT: 27 % — ABNORMAL LOW (ref 36.0–46.0)
HCT: 34 % — ABNORMAL LOW (ref 36.0–46.0)
Hemoglobin: 10.5 g/dL — ABNORMAL LOW (ref 12.0–15.0)
Hemoglobin: 6.5 g/dL — CL (ref 12.0–15.0)
Hemoglobin: 9.2 g/dL — ABNORMAL LOW (ref 12.0–15.0)
Hemoglobin: 11.6 g/dL — ABNORMAL LOW (ref 12.0–15.0)
Potassium: 3.8 mEq/L (ref 3.5–5.1)
Potassium: 5.1 mEq/L (ref 3.5–5.1)
Sodium: 139 mEq/L (ref 135–145)

## 2012-08-25 LAB — HEMOGLOBIN AND HEMATOCRIT, BLOOD: Hemoglobin: 7.9 g/dL — ABNORMAL LOW (ref 12.0–15.0)

## 2012-08-25 LAB — CBC
MCH: 31.4 pg (ref 26.0–34.0)
MCH: 31.4 pg (ref 26.0–34.0)
MCHC: 34.4 g/dL (ref 30.0–36.0)
Platelets: 291 10*3/uL (ref 150–400)
Platelets: 184 10*3/uL (ref 150–400)
Platelets: 187 10*3/uL (ref 150–400)
RBC: 3.36 MIL/uL — ABNORMAL LOW (ref 3.87–5.11)
RBC: 3.44 MIL/uL — ABNORMAL LOW (ref 3.87–5.11)
RDW: 15.4 % (ref 11.5–15.5)
RDW: 16.5 % — ABNORMAL HIGH (ref 11.5–15.5)
WBC: 4.9 10*3/uL (ref 4.0–10.5)
WBC: 14 10*3/uL — ABNORMAL HIGH (ref 4.0–10.5)

## 2012-08-25 LAB — CREATININE, SERUM
Creatinine, Ser: 0.63 mg/dL (ref 0.50–1.10)
GFR calc non Af Amer: 82 mL/min — ABNORMAL LOW (ref >90)

## 2012-08-25 LAB — URINALYSIS, ROUTINE W REFLEX MICROSCOPIC
Glucose, UA: NEGATIVE mg/dL
Hgb urine dipstick: NEGATIVE
Specific Gravity, Urine: 1.009 (ref 1.005–1.030)
pH: 7 (ref 5.0–8.0)

## 2012-08-25 LAB — GLUCOSE, CAPILLARY: Glucose-Capillary: 100 mg/dL — ABNORMAL HIGH (ref 70–99)

## 2012-08-25 LAB — MAGNESIUM: Magnesium: 3 mg/dL — ABNORMAL HIGH (ref 1.5–2.5)

## 2012-08-25 LAB — POCT I-STAT GLUCOSE: Operator id: 3390

## 2012-08-25 LAB — HEMOGLOBIN A1C
Hgb A1c MFr Bld: 5.4 % (ref <5.7)
Mean Plasma Glucose: 108 mg/dL (ref <117)

## 2012-08-25 LAB — HEPARIN LEVEL (UNFRACTIONATED): Heparin Unfractionated: 0.44 IU/mL (ref 0.30–0.70)

## 2012-08-25 LAB — PROTIME-INR: Prothrombin Time: 16.5 seconds — ABNORMAL HIGH (ref 11.6–15.2)

## 2012-08-25 LAB — SURGICAL PCR SCREEN
MRSA, PCR: NEGATIVE
Staphylococcus aureus: POSITIVE — AB

## 2012-08-25 SURGERY — CORONARY ARTERY BYPASS GRAFTING (CABG)
Anesthesia: General | Site: Chest | Wound class: Clean

## 2012-08-25 MED ORDER — HEMOSTATIC AGENTS (NO CHARGE) OPTIME
TOPICAL | Status: DC | PRN
  Administered 2012-08-25: 1 via TOPICAL

## 2012-08-25 MED ORDER — LACTATED RINGERS IV SOLN
INTRAVENOUS | Status: DC | PRN
  Administered 2012-08-25: 07:00:00 via INTRAVENOUS

## 2012-08-25 MED ORDER — METOPROLOL TARTRATE 25 MG/10 ML ORAL SUSPENSION
12.5000 mg | Freq: Two times a day (BID) | ORAL | Status: DC
  Filled 2012-08-25 (×9): qty 5

## 2012-08-25 MED ORDER — ACETAMINOPHEN 10 MG/ML IV SOLN
1000.0000 mg | Freq: Once | INTRAVENOUS | Status: AC
  Administered 2012-08-25: 1000 mg via INTRAVENOUS
  Filled 2012-08-25: qty 100

## 2012-08-25 MED ORDER — VECURONIUM BROMIDE 10 MG IV SOLR
INTRAVENOUS | Status: DC | PRN
  Administered 2012-08-25 (×2): 5 mg via INTRAVENOUS

## 2012-08-25 MED ORDER — MIDAZOLAM HCL 2 MG/2ML IJ SOLN: 2.0000 mg | INTRAMUSCULAR | Status: DC | PRN

## 2012-08-25 MED ORDER — ALBUMIN HUMAN 5 % IV SOLN
250.0000 mL | INTRAVENOUS | Status: AC | PRN
  Administered 2012-08-25 – 2012-08-26 (×4): 250 mL via INTRAVENOUS
  Filled 2012-08-25: qty 500
  Filled 2012-08-25: qty 250

## 2012-08-25 MED ORDER — MORPHINE SULFATE 2 MG/ML IJ SOLN
2.0000 mg | INTRAMUSCULAR | Status: DC | PRN
  Administered 2012-08-26 (×2): 2 mg via INTRAVENOUS
  Filled 2012-08-25 (×2): qty 1

## 2012-08-25 MED ORDER — FENTANYL CITRATE 0.05 MG/ML IJ SOLN
INTRAMUSCULAR | Status: DC | PRN
  Administered 2012-08-25: 50 ug via INTRAVENOUS
  Administered 2012-08-25: 100 ug via INTRAVENOUS
  Administered 2012-08-25 (×2): 150 ug via INTRAVENOUS
  Administered 2012-08-25: 250 ug via INTRAVENOUS
  Administered 2012-08-25: 50 ug via INTRAVENOUS
  Administered 2012-08-25: 100 ug via INTRAVENOUS

## 2012-08-25 MED ORDER — SODIUM CHLORIDE 0.45 % IV SOLN
INTRAVENOUS | Status: DC
  Administered 2012-08-25: 20 mL/h via INTRAVENOUS

## 2012-08-25 MED ORDER — MORPHINE SULFATE 2 MG/ML IJ SOLN
1.0000 mg | INTRAMUSCULAR | Status: AC | PRN
  Administered 2012-08-25 (×2): 2 mg via INTRAVENOUS
  Filled 2012-08-25 (×2): qty 1

## 2012-08-25 MED ORDER — LIDOCAINE HCL (CARDIAC) 20 MG/ML IV SOLN
INTRAVENOUS | Status: DC | PRN
  Administered 2012-08-25: 80 mg via INTRAVENOUS

## 2012-08-25 MED ORDER — SODIUM CHLORIDE 0.9 % IV SOLN
INTRAVENOUS | Status: DC
  Administered 2012-08-25: 20 mL/h via INTRAVENOUS

## 2012-08-25 MED ORDER — NITROGLYCERIN IN D5W 200-5 MCG/ML-% IV SOLN
0.0000 ug/min | INTRAVENOUS | Status: DC
  Administered 2012-08-25: 5 ug/min via INTRAVENOUS

## 2012-08-25 MED ORDER — ONDANSETRON HCL 4 MG/2ML IJ SOLN
4.0000 mg | Freq: Four times a day (QID) | INTRAMUSCULAR | Status: DC | PRN
  Administered 2012-08-25 – 2012-08-27 (×6): 4 mg via INTRAVENOUS
  Filled 2012-08-25 (×6): qty 2

## 2012-08-25 MED ORDER — BISACODYL 10 MG RE SUPP: 10.0000 mg | Freq: Every day | RECTAL | Status: DC

## 2012-08-25 MED ORDER — DEXMEDETOMIDINE HCL IN NACL 200 MCG/50ML IV SOLN
0.1000 ug/kg/h | INTRAVENOUS | Status: DC
  Administered 2012-08-25: 0.7 ug/kg/h via INTRAVENOUS

## 2012-08-25 MED ORDER — MIDAZOLAM HCL 5 MG/5ML IJ SOLN
INTRAMUSCULAR | Status: DC | PRN
  Administered 2012-08-25: 3 mg via INTRAVENOUS
  Administered 2012-08-25 (×2): 2 mg via INTRAVENOUS

## 2012-08-25 MED ORDER — ATORVASTATIN CALCIUM 40 MG PO TABS
40.0000 mg | ORAL_TABLET | Freq: Every day | ORAL | Status: DC
  Administered 2012-08-26 – 2012-09-01 (×7): 40 mg via ORAL
  Filled 2012-08-25 (×8): qty 1

## 2012-08-25 MED ORDER — LACTATED RINGERS IV SOLN
INTRAVENOUS | Status: DC
  Administered 2012-08-25: 20 mL/h via INTRAVENOUS

## 2012-08-25 MED ORDER — DOCUSATE SODIUM 100 MG PO CAPS
200.0000 mg | ORAL_CAPSULE | Freq: Every day | ORAL | Status: DC
  Administered 2012-08-28: 200 mg via ORAL
  Filled 2012-08-25: qty 2

## 2012-08-25 MED ORDER — LACTATED RINGERS IV SOLN: 500.0000 mL | Freq: Once | INTRAVENOUS | Status: AC | PRN

## 2012-08-25 MED ORDER — MUPIROCIN 2 % EX OINT
1.0000 "application " | TOPICAL_OINTMENT | Freq: Two times a day (BID) | CUTANEOUS | Status: AC
  Administered 2012-08-25 – 2012-08-30 (×10): 1 via NASAL
  Filled 2012-08-25: qty 22

## 2012-08-25 MED ORDER — FENTANYL CITRATE 0.05 MG/ML IJ SOLN
INTRAMUSCULAR | Status: AC
  Filled 2012-08-25: qty 2

## 2012-08-25 MED ORDER — PANTOPRAZOLE SODIUM 40 MG PO TBEC
40.0000 mg | DELAYED_RELEASE_TABLET | Freq: Every day | ORAL | Status: DC
  Administered 2012-08-27 – 2012-08-29 (×3): 40 mg via ORAL
  Filled 2012-08-25 (×3): qty 1

## 2012-08-25 MED ORDER — DEXTROSE 5 % IV SOLN
1.5000 g | Freq: Two times a day (BID) | INTRAVENOUS | Status: AC
  Administered 2012-08-25 – 2012-08-27 (×4): 1.5 g via INTRAVENOUS
  Filled 2012-08-25 (×4): qty 1.5

## 2012-08-25 MED ORDER — POTASSIUM CHLORIDE 10 MEQ/50ML IV SOLN: 10.0000 meq | INTRAVENOUS | Status: AC

## 2012-08-25 MED ORDER — METOPROLOL TARTRATE 1 MG/ML IV SOLN: 2.5000 mg | INTRAVENOUS | Status: DC | PRN

## 2012-08-25 MED ORDER — SODIUM CHLORIDE 0.9 % IJ SOLN: 3.0000 mL | INTRAMUSCULAR | Status: DC | PRN

## 2012-08-25 MED ORDER — AMIODARONE HCL 200 MG PO TABS
200.0000 mg | ORAL_TABLET | Freq: Every day | ORAL | Status: DC
  Administered 2012-08-26 – 2012-09-02 (×8): 200 mg via ORAL
  Filled 2012-08-25 (×8): qty 1

## 2012-08-25 MED ORDER — PHENYLEPHRINE HCL 10 MG/ML IJ SOLN
0.0000 ug/min | INTRAMUSCULAR | Status: DC
  Administered 2012-08-25: 45 ug/min via INTRAVENOUS
  Filled 2012-08-25 (×2): qty 2

## 2012-08-25 MED ORDER — CHLORHEXIDINE GLUCONATE CLOTH 2 % EX PADS
6.0000 | MEDICATED_PAD | Freq: Every day | CUTANEOUS | Status: AC
  Administered 2012-08-26 – 2012-08-29 (×3): 6 via TOPICAL

## 2012-08-25 MED ORDER — BISACODYL 5 MG PO TBEC
10.0000 mg | DELAYED_RELEASE_TABLET | Freq: Every day | ORAL | Status: DC
  Administered 2012-08-26: 10 mg via ORAL
  Filled 2012-08-25: qty 2

## 2012-08-25 MED ORDER — HEPARIN SODIUM (PORCINE) 1000 UNIT/ML IJ SOLN
INTRAMUSCULAR | Status: DC | PRN
  Administered 2012-08-25: 31000 [IU] via INTRAVENOUS

## 2012-08-25 MED ORDER — PROTAMINE SULFATE 10 MG/ML IV SOLN
INTRAVENOUS | Status: DC | PRN
  Administered 2012-08-25: 290 mg via INTRAVENOUS

## 2012-08-25 MED ORDER — ARTIFICIAL TEARS OP OINT
TOPICAL_OINTMENT | OPHTHALMIC | Status: DC | PRN
  Administered 2012-08-25: 1 via OPHTHALMIC

## 2012-08-25 MED ORDER — ACETAMINOPHEN 160 MG/5ML PO SOLN
975.0000 mg | Freq: Four times a day (QID) | ORAL | Status: DC
  Filled 2012-08-25: qty 40.6

## 2012-08-25 MED ORDER — MIDAZOLAM HCL 2 MG/2ML IJ SOLN
INTRAMUSCULAR | Status: AC
  Filled 2012-08-25: qty 2

## 2012-08-25 MED ORDER — ACETAMINOPHEN 500 MG PO TABS
1000.0000 mg | ORAL_TABLET | Freq: Four times a day (QID) | ORAL | Status: DC
  Administered 2012-08-26 – 2012-08-29 (×11): 1000 mg via ORAL
  Filled 2012-08-25 (×19): qty 2

## 2012-08-25 MED ORDER — SODIUM CHLORIDE 0.9 % IV SOLN
250.0000 mL | INTRAVENOUS | Status: DC
  Administered 2012-08-26: 250 mL via INTRAVENOUS

## 2012-08-25 MED ORDER — MAGNESIUM SULFATE 40 MG/ML IJ SOLN
4.0000 g | Freq: Once | INTRAMUSCULAR | Status: AC
  Administered 2012-08-25: 4 g via INTRAVENOUS
  Filled 2012-08-25: qty 100

## 2012-08-25 MED ORDER — SODIUM CHLORIDE 0.9 % IV SOLN
INTRAVENOUS | Status: DC | PRN
  Administered 2012-08-25: 13:00:00 via INTRAVENOUS

## 2012-08-25 MED ORDER — ROCURONIUM BROMIDE 100 MG/10ML IV SOLN
INTRAVENOUS | Status: DC | PRN
  Administered 2012-08-25: 60 mg via INTRAVENOUS
  Administered 2012-08-25: 40 mg via INTRAVENOUS

## 2012-08-25 MED ORDER — ASPIRIN 81 MG PO CHEW: 324.0000 mg | CHEWABLE_TABLET | Freq: Every day | ORAL | Status: DC

## 2012-08-25 MED ORDER — INSULIN REGULAR BOLUS VIA INFUSION
0.0000 [IU] | Freq: Three times a day (TID) | INTRAVENOUS | Status: DC
  Filled 2012-08-25: qty 10

## 2012-08-25 MED ORDER — 0.9 % SODIUM CHLORIDE (POUR BTL) OPTIME
TOPICAL | Status: DC | PRN
  Administered 2012-08-25: 5000 mL

## 2012-08-25 MED ORDER — OXYCODONE HCL 5 MG PO TABS: 5.0000 mg | ORAL_TABLET | ORAL | Status: DC | PRN

## 2012-08-25 MED ORDER — SODIUM CHLORIDE 0.9 % IJ SOLN
3.0000 mL | Freq: Two times a day (BID) | INTRAMUSCULAR | Status: DC
  Administered 2012-08-26 – 2012-08-29 (×7): 3 mL via INTRAVENOUS

## 2012-08-25 MED ORDER — SODIUM CHLORIDE 0.9 % IV SOLN
INTRAVENOUS | Status: DC
  Filled 2012-08-25 (×2): qty 1

## 2012-08-25 MED ORDER — FAMOTIDINE IN NACL 20-0.9 MG/50ML-% IV SOLN
20.0000 mg | Freq: Two times a day (BID) | INTRAVENOUS | Status: AC
  Administered 2012-08-25: 20 mg via INTRAVENOUS

## 2012-08-25 MED ORDER — ASPIRIN EC 325 MG PO TBEC
325.0000 mg | DELAYED_RELEASE_TABLET | Freq: Every day | ORAL | Status: DC
  Administered 2012-08-26 – 2012-08-29 (×4): 325 mg via ORAL
  Filled 2012-08-25 (×4): qty 1

## 2012-08-25 MED ORDER — METOPROLOL TARTRATE 12.5 MG HALF TABLET
12.5000 mg | ORAL_TABLET | Freq: Two times a day (BID) | ORAL | Status: DC
  Administered 2012-08-27 – 2012-08-29 (×3): 12.5 mg via ORAL
  Filled 2012-08-25 (×9): qty 1

## 2012-08-25 MED ORDER — VANCOMYCIN HCL IN DEXTROSE 1-5 GM/200ML-% IV SOLN
1000.0000 mg | Freq: Once | INTRAVENOUS | Status: AC
  Administered 2012-08-25: 1000 mg via INTRAVENOUS
  Filled 2012-08-25: qty 200

## 2012-08-25 MED ORDER — PROPOFOL 10 MG/ML IV BOLUS
INTRAVENOUS | Status: DC | PRN
  Administered 2012-08-25: 30 mg via INTRAVENOUS

## 2012-08-25 MED FILL — Sodium Chloride Irrigation Soln 0.9%: Qty: 3000 | Status: AC

## 2012-08-25 MED FILL — Sodium Bicarbonate IV Soln 8.4%: INTRAVENOUS | Qty: 50 | Status: AC

## 2012-08-25 MED FILL — Mannitol IV Soln 20%: INTRAVENOUS | Qty: 500 | Status: AC

## 2012-08-25 MED FILL — Sodium Chloride IV Soln 0.9%: INTRAVENOUS | Qty: 1000 | Status: AC

## 2012-08-25 MED FILL — Electrolyte-R (PH 7.4) Solution: INTRAVENOUS | Qty: 6000 | Status: AC

## 2012-08-25 MED FILL — Lidocaine HCl IV Inj 20 MG/ML: INTRAVENOUS | Qty: 5 | Status: AC

## 2012-08-25 MED FILL — Heparin Sodium (Porcine) Inj 1000 Unit/ML: INTRAMUSCULAR | Qty: 30 | Status: AC

## 2012-08-25 SURGICAL SUPPLY — 100 items
ATTRACTOMAT 16X20 MAGNETIC DRP (DRAPES) ×2 IMPLANT
BAG DECANTER FOR FLEXI CONT (MISCELLANEOUS) ×2 IMPLANT
BANDAGE ELASTIC 4 VELCRO ST LF (GAUZE/BANDAGES/DRESSINGS) ×4 IMPLANT
BANDAGE ELASTIC 6 VELCRO ST LF (GAUZE/BANDAGES/DRESSINGS) ×4 IMPLANT
BANDAGE GAUZE ELAST BULKY 4 IN (GAUZE/BANDAGES/DRESSINGS) ×4 IMPLANT
BASKET HEART (ORDER IN 25'S) (MISCELLANEOUS) ×1
BASKET HEART (ORDER IN 25S) (MISCELLANEOUS) ×1 IMPLANT
BLADE STERNUM SYSTEM 6 (BLADE) ×2 IMPLANT
CANISTER SUCTION 2500CC (MISCELLANEOUS) ×2 IMPLANT
CANNULA VENOUS LOW PROF 34X46 (CANNULA) ×2 IMPLANT
CATH ROBINSON RED A/P 18FR (CATHETERS) ×4 IMPLANT
CATH THORACIC 28FR (CATHETERS) ×2 IMPLANT
CATH THORACIC 28FR RT ANG (CATHETERS) IMPLANT
CATH THORACIC 36FR (CATHETERS) ×2 IMPLANT
CATH THORACIC 36FR RT ANG (CATHETERS) ×2 IMPLANT
CLIP TI MEDIUM 24 (CLIP) IMPLANT
CLIP TI WIDE RED SMALL 24 (CLIP) ×2 IMPLANT
CLOTH BEACON ORANGE TIMEOUT ST (SAFETY) ×2 IMPLANT
COVER SURGICAL LIGHT HANDLE (MISCELLANEOUS) ×2 IMPLANT
CRADLE DONUT ADULT HEAD (MISCELLANEOUS) ×2 IMPLANT
DRAPE CARDIOVASCULAR INCISE (DRAPES) ×1
DRAPE SLUSH MACHINE 52X66 (DRAPES) IMPLANT
DRAPE SLUSH/WARMER DISC (DRAPES) ×2 IMPLANT
DRAPE SRG 135X102X78XABS (DRAPES) ×1 IMPLANT
DRSG COVADERM 4X14 (GAUZE/BANDAGES/DRESSINGS) ×2 IMPLANT
ELECT CAUTERY BLADE 6.4 (BLADE) ×2 IMPLANT
ELECT REM PT RETURN 9FT ADLT (ELECTROSURGICAL) ×4
ELECTRODE REM PT RTRN 9FT ADLT (ELECTROSURGICAL) ×2 IMPLANT
GLOVE BIO SURGEON STRL SZ 6 (GLOVE) IMPLANT
GLOVE BIO SURGEON STRL SZ 6.5 (GLOVE) ×12 IMPLANT
GLOVE BIO SURGEON STRL SZ7 (GLOVE) IMPLANT
GLOVE BIO SURGEON STRL SZ7.5 (GLOVE) ×4 IMPLANT
GLOVE BIOGEL PI IND STRL 6 (GLOVE) IMPLANT
GLOVE BIOGEL PI IND STRL 6.5 (GLOVE) ×5 IMPLANT
GLOVE BIOGEL PI IND STRL 7.0 (GLOVE) ×5 IMPLANT
GLOVE BIOGEL PI INDICATOR 6 (GLOVE)
GLOVE BIOGEL PI INDICATOR 6.5 (GLOVE) ×5
GLOVE BIOGEL PI INDICATOR 7.0 (GLOVE) ×5
GLOVE EUDERMIC 7 POWDERFREE (GLOVE) ×10 IMPLANT
GLOVE ORTHO TXT STRL SZ7.5 (GLOVE) IMPLANT
GOWN PREVENTION PLUS XLARGE (GOWN DISPOSABLE) ×18 IMPLANT
GOWN STRL NON-REIN LRG LVL3 (GOWN DISPOSABLE) IMPLANT
HEMOSTAT POWDER SURGIFOAM 1G (HEMOSTASIS) ×6 IMPLANT
HEMOSTAT SURGICEL 2X14 (HEMOSTASIS) ×2 IMPLANT
INSERT FOGARTY 61MM (MISCELLANEOUS) ×2 IMPLANT
INSERT FOGARTY XLG (MISCELLANEOUS) IMPLANT
KIT BASIN OR (CUSTOM PROCEDURE TRAY) ×2 IMPLANT
KIT CATH CPB BARTLE (MISCELLANEOUS) ×2 IMPLANT
KIT ROOM TURNOVER OR (KITS) ×2 IMPLANT
KIT SUCTION CATH 14FR (SUCTIONS) ×2 IMPLANT
KIT VASOVIEW W/TROCAR VH 2000 (KITS) ×2 IMPLANT
NS IRRIG 1000ML POUR BTL (IV SOLUTION) ×10 IMPLANT
PACK OPEN HEART (CUSTOM PROCEDURE TRAY) ×2 IMPLANT
PAD ARMBOARD 7.5X6 YLW CONV (MISCELLANEOUS) ×4 IMPLANT
PENCIL BUTTON HOLSTER BLD 10FT (ELECTRODE) ×2 IMPLANT
PUNCH AORTIC ROTATE 4.0MM (MISCELLANEOUS) ×2 IMPLANT
PUNCH AORTIC ROTATE 4.5MM 8IN (MISCELLANEOUS) ×2 IMPLANT
PUNCH AORTIC ROTATE 5MM 8IN (MISCELLANEOUS) IMPLANT
SET CARDIOPLEGIA MPS 5001102 (MISCELLANEOUS) ×2 IMPLANT
SPONGE GAUZE 4X4 12PLY (GAUZE/BANDAGES/DRESSINGS) ×4 IMPLANT
SPONGE INTESTINAL PEANUT (DISPOSABLE) IMPLANT
SPONGE LAP 18X18 X RAY DECT (DISPOSABLE) IMPLANT
SPONGE LAP 4X18 X RAY DECT (DISPOSABLE) ×2 IMPLANT
SUT BONE WAX W31G (SUTURE) ×2 IMPLANT
SUT MNCRL AB 4-0 PS2 18 (SUTURE) IMPLANT
SUT PROLENE 3 0 SH DA (SUTURE) IMPLANT
SUT PROLENE 3 0 SH1 36 (SUTURE) IMPLANT
SUT PROLENE 4 0 RB 1 (SUTURE)
SUT PROLENE 4 0 SH DA (SUTURE) IMPLANT
SUT PROLENE 4-0 RB1 .5 CRCL 36 (SUTURE) IMPLANT
SUT PROLENE 5 0 C 1 36 (SUTURE) IMPLANT
SUT PROLENE 6 0 C 1 30 (SUTURE) ×2 IMPLANT
SUT PROLENE 7 0 BV 1 (SUTURE) IMPLANT
SUT PROLENE 7 0 BV1 MDA (SUTURE) ×4 IMPLANT
SUT PROLENE 8 0 BV175 6 (SUTURE) IMPLANT
SUT SILK  1 MH (SUTURE)
SUT SILK 1 MH (SUTURE) IMPLANT
SUT SILK 2 0 SH CR/8 (SUTURE) ×2 IMPLANT
SUT STEEL 6MS V (SUTURE) ×4 IMPLANT
SUT STEEL STERNAL CCS#1 18IN (SUTURE) IMPLANT
SUT STEEL SZ 6 DBL 3X14 BALL (SUTURE) IMPLANT
SUT VIC AB 1 CTX 36 (SUTURE)
SUT VIC AB 1 CTX36XBRD ANBCTR (SUTURE) IMPLANT
SUT VIC AB 2-0 CT1 27 (SUTURE) ×2
SUT VIC AB 2-0 CT1 TAPERPNT 27 (SUTURE) ×2 IMPLANT
SUT VIC AB 2-0 CTX 27 (SUTURE) IMPLANT
SUT VIC AB 3-0 SH 27 (SUTURE) ×1
SUT VIC AB 3-0 SH 27X BRD (SUTURE) ×1 IMPLANT
SUT VIC AB 3-0 X1 27 (SUTURE) ×2 IMPLANT
SUT VICRYL 4-0 PS2 18IN ABS (SUTURE) IMPLANT
SUTURE E-PAK OPEN HEART (SUTURE) ×2 IMPLANT
SYSTEM SAHARA CHEST DRAIN ATS (WOUND CARE) ×2 IMPLANT
TAPE CLOTH SURG 6X10 WHT LF (GAUZE/BANDAGES/DRESSINGS) ×2 IMPLANT
TOWEL OR 17X24 6PK STRL BLUE (TOWEL DISPOSABLE) ×2 IMPLANT
TOWEL OR 17X26 10 PK STRL BLUE (TOWEL DISPOSABLE) ×2 IMPLANT
TRAY FOLEY IC TEMP SENS 14FR (CATHETERS) ×2 IMPLANT
TUBE SUCT INTRACARD DLP 20F (MISCELLANEOUS) ×2 IMPLANT
TUBING INSUFFLATION 10FT LAP (TUBING) ×2 IMPLANT
UNDERPAD 30X30 INCONTINENT (UNDERPADS AND DIAPERS) ×4 IMPLANT
WATER STERILE IRR 1000ML POUR (IV SOLUTION) ×4 IMPLANT

## 2012-08-25 NOTE — Transfer of Care (Signed)
Immediate Anesthesia Transfer of Care Note  Patient: Jacqueline [name]  Procedure(s) Performed: Procedure(s) with comments: CORONARY ARTERY BYPASS GRAFTING  (N/A) - four bypasses total   Patient Location: ICU  Anesthesia Type:General  Level of Consciousness: sedated and unresponsive  Airway & Oxygen Therapy: Patient remains intubated per anesthesia plan and Patient placed on Ventilator (see vital sign flow sheet for setting)  Post-op Assessment: Report given to PACU RN and Post -op Vital signs reviewed and stable  Post vital signs: Reviewed and stable  Complications: No apparent anesthesia complications

## 2012-08-25 NOTE — Brief Op Note (Signed)
08/18/2012 - 08/25/2012  1:25 PM  PATIENT:  Jacqueline [name]  79 y.o. female  PRE-OPERATIVE DIAGNOSIS:  Coronary Artery Disease  POST-OPERATIVE DIAGNOSIS:  Coronary Artery Disease  PROCEDURE:  Procedure(s): CORONARY ARTERY BYPASS GRAFTING X5 LIMA-LAD;SEQ SVG-0M1-OM2; SVG-DIAG; SVG-RCA EVH [name] LEG/LEFT THIGH  SURGEON:  Surgeon(s): Alleen Borne, MD  PHYSICIAN ASSISTANT: Edu On PA-C  ANESTHESIA:   general  PATIENT CONDITION:  ICU - intubated and hemodynamically stable.  PRE-OPERATIVE WEIGHT: 56kg  COMPLICATIONS: NO KNOWN

## 2012-08-25 NOTE — Anesthesia Procedure Notes (Signed)
Date/Time: 08/25/2012 8:02 AM Performed by: Lanell Matar Pre-anesthesia Checklist: Patient identified, Emergency Drugs available, Suction available, Patient being monitored and Timeout performed Patient Re-evaluated:Patient Re-evaluated prior to inductionOxygen Delivery Method: Circle system utilized Preoxygenation: Pre-oxygenation with 100% oxygen Intubation Type: IV induction Ventilation: Mask ventilation without difficulty Laryngoscope Size: Mac and 3 Grade View: Grade I Tube type: Oral Tube size: 8.0 mm Number of attempts: 1 Airway Equipment and Method: Stylet Placement Confirmation: ETT inserted through vocal cords under direct vision,  positive ETCO2,  CO2 detector and breath sounds checked- equal and bilateral Secured at: 23 cm Tube secured with: Tape Dental Injury: Teeth and Oropharynx as per pre-operative assessment

## 2012-08-25 NOTE — Op Note (Signed)
CARDIOVASCULAR SURGERY OPERATIVE NOTE  08/25/2012  Surgeon:  Alleen Borne, MD  First Assistant: Gershon Crane, [facility]   Preoperative Diagnosis:  Severe multi-vessel coronary artery disease   Postoperative Diagnosis:  Same   Procedure:  1. Median Sternotomy 2. Extracorporeal circulation 3.   Coronary artery bypass grafting x 5   Left internal mammary graft to the LAD  SVG to diagonal  Sequential SVG to OM1 and OM2  SVG to RCA 4.   Endoscopic vein harvest from both legs.  Anesthesia:  General Endotracheal   Clinical History/Surgical Indication:  The patient is an 79 year old woman who recently moved to this area from [facility] with her son and daughter-in-law. She tells me that she's had about a two-week history of intermittent substernal chest discomfort usually occurring at night while lying down that she thought was indigestion. She told her admitting physician that this had been going on for months. She had been placed on omeprazole without resolution. She had an episode on the morning of admission that lasted about 20 minutes and resolved. She ruled out for myocardial infarction. Cardiac catheterization is as noted below:  Cardiac Cath:  Procedural Findings:  Hemodynamics:  AO 135/59 with a mean of 90 mmHg  LV 140/12 mmHg  By pullback there is a less than 5 mm aortic valve gradient.  Coronary angiography:  Coronary dominance: right  Left mainstem: The left main coronary is moderately calcified. It tapers to 30% stenosis distally.  Left anterior descending (LAD): The LAD is heavily calcified there is a 95% ostial stenosis followed by a 95% stenosis in the mid vessel.  Left circumflex (LCx): The left circumflex is a large vessel that gives rise to 2 marginal branches distally. In the mid vessel there is a 90% stenosis prior to the bifurcation. The first obtuse marginal vessel and  70% disease proximally.  Right coronary artery (RCA): The right coronary is diffusely diseased in the proximal and mid vessel. There is a 90% stenosis in the proximal vessel followed by a 95% stenosis in the mid vessel. There is TIMI grade 2 flow.  Left ventriculography: Left ventricular systolic function is normal, LVEF is estimated at 55-65%, there is no significant mitral regurgitation  Final Conclusions:  1. Critical three-vessel obstructive coronary disease.  2. Normal LV function.   Preparation:  The patient was seen in the preoperative holding area and the correct patient, correct operation were confirmed with the patient after reviewing the medical record and catheterization. The consent was signed by me. Preoperative antibiotics were given. A pulmonary arterial line and radial arterial line were placed by the anesthesia team. The patient was taken back to the operating room and positioned supine on the operating room table. After being placed under general endotracheal anesthesia by the anesthesia team a foley catheter was placed. The neck, chest, abdomen, and both legs were prepped with betadine soap and solution and draped in the usual sterile manner. A surgical time-out was taken and the correct patient and operative procedure were confirmed with the nursing and anesthesia staff.   Cardiopulmonary Bypass:  A median sternotomy was performed. The pericardium was opened in the midline. Right ventricular function appeared normal. The ascending aorta was of normal size and had no palpable plaque. There were no contraindications to aortic cannulation or cross-clamping. The patient was fully systemically heparinized and the ACT was maintained > 400 sec. The proximal aortic arch was cannulated with a 22 F aortic cannula for arterial inflow. Venous cannulation was performed via  the right atrial appendage using a two-staged venous cannula. An antegrade cardioplegia/vent cannula was inserted into the  mid-ascending aorta. Aortic occlusion was performed with a single cross-clamp. Systemic cooling to 32 degrees Centigrade and topical cooling of the heart with iced saline were used. Hyperkalemic antegrade cold blood cardioplegia was used to induce diastolic arrest and was then given at about 20 minute intervals throughout the period of arrest to maintain myocardial temperature at or below 10 degrees centigrade. A temperature probe was inserted into the interventricular septum and an insulating pad was placed in the pericardium.   Left internal mammary harvest:  The left side of the sternum was retracted using the Rultract retractor. The left internal mammary artery was harvested as a pedicle graft. All side branches were clipped. It was a medium-sized vessel of good quality with excellent blood flow. It was ligated distally and divided. It was sprayed with topical papaverine solution to prevent vasospasm.   Endoscopic vein harvest:  The right greater saphenous vein was harvested endoscopically through a 2 cm incision medial to the right knee. It was harvested from the upper thigh to below the knee. It was a small-sized vein of good quality. Unfortunately only a short piece of the vein in the upper thigh was of suitable size. Therefore the left saphenous vein was harvested from the thigh endoscopically. It was of medium-size and good quality. The side branches were all ligated with 4-0 silk ties.    Coronary arteries:  The coronary arteries were examined.   LAD:  Diffusely diseased  LCX:  OM1 and OM2 with moderate distal disease  RCA:  Diffusely diseased   Grafts:  1. LIMA to the LAD: 1.75 mm. It was sewn end to side using 8-0 prolene continuous suture. 2. OM1:  1.6 mm. It was sewn side to side using 7-0 prolene continuous suture. 3. OM2:  1.6 mm. It was sewn end to side using 7-0 prolene continuous suture. 4. Diag:  1.5 mm. It was sewn end to side using 7-0 prolene continuous  suture.  The proximal vein graft anastomoses were performed to the mid-ascending aorta using continuous 6-0 prolene suture. Graft markers were placed around the proximal anastomoses.   Completion:  The patient was rewarmed to 37 degrees Centigrade. The clamp was removed from the LIMA pedicle and there was rapid warming of the septum and return of ventricular fibrillation. The crossclamp was removed with a time of 95 minutes. There was spontaneous return of sinus rhythm. The distal and proximal anastomoses were checked for hemostasis. The position of the grafts was satisfactory. Two temporary epicardial pacing wires were placed on the right atrium and two on the right ventricle. The patient was weaned from CPB without difficulty on no inotropes. CPB time was 111 minutes. Cardiac output was 3.5 LPM. Heparin was fully reversed with protamine and the aortic and venous cannulas removed. Hemostasis was achieved. Mediastinal and left pleural drainage tubes were placed. The sternum was closed with double #6 stainless steel wires. The fascia was closed with continuous # 1 vicryl suture. The subcutaneous tissue was closed with 2-0 vicryl continuous suture. The skin was closed with 3-0 vicryl subcuticular suture. All sponge, needle, and instrument counts were reported correct at the end of the case. Dry sterile dressings were placed over the incisions and around the chest tubes which were connected to pleurevac suction. The patient was then transported to the surgical intensive care unit in critical but stable condition.

## 2012-08-25 NOTE — Anesthesia Preprocedure Evaluation (Addendum)
Anesthesia Evaluation  Patient identified by MRN, date of birth, ID band Patient awake    Reviewed: Allergy & Precautions, H&P , NPO status , Patient's Chart, lab work & pertinent test results  Airway Mallampati: I TM Distance: >3 FB Neck ROM: Full    Dental  (+) Teeth Intact, Caps and Dental Advisory Given   Pulmonary former smoker,  breath sounds clear to auscultation        Cardiovascular hypertension, + CAD Rhythm:Regular Rate:Normal     Neuro/Psych    GI/Hepatic   Endo/Other    Renal/GU      Musculoskeletal   Abdominal   Peds  Hematology   Anesthesia Other Findings   Reproductive/Obstetrics                          Anesthesia Physical Anesthesia Plan  ASA: III  Anesthesia Plan: General   Post-op Pain Management:    Induction: Intravenous  Airway Management Planned: Oral ETT  Additional Equipment: PA Cath and Arterial line  Intra-op Plan:   Post-operative Plan: Post-operative intubation/ventilation  Informed Consent: I have reviewed the patients History and Physical, chart, labs and discussed the procedure including the risks, benefits and alternatives for the proposed anesthesia with the patient or authorized representative who has indicated his/her understanding and acceptance.     Plan Discussed with: CRNA and Surgeon  Anesthesia Plan Comments:         Anesthesia Quick Evaluation

## 2012-08-25 NOTE — Progress Notes (Signed)
Patient ID: Jacqueline [name], female   DOB: 05/30/1928, 79 y.o.   MRN: [id number]  SICU Evening Rounds:  Hemodynamically stable. CI only 1.3 but all other parameters point to good cardiac index. Still on vent. Urine output excellent CT output low. CBC    Component Value Date/Time   WBC 14.0* 08/25/2012 1400   RBC 3.79* 08/25/2012 1400   HGB 11.9* 08/25/2012 1400   HCT 34.6* 08/25/2012 1400   PLT 184 08/25/2012 1400   MCV 91.3 08/25/2012 1400   MCH 31.4 08/25/2012 1400   MCHC 34.4 08/25/2012 1400   RDW 16.5* 08/25/2012 1400   LYMPHSABS 0.8 08/18/2012 1445   MONOABS 0.5 08/18/2012 1445   EOSABS 0.2 08/18/2012 1445   BASOSABS 0.1 08/18/2012 1445    BMET    Component Value Date/Time   NA 137 08/25/2012 1352   K 5.0 08/25/2012 1352   CL 105 08/25/2012 0335   CO2 22 08/25/2012 0335   GLUCOSE 144* 08/25/2012 1352   BUN 14 08/25/2012 0335   CREATININE 0.75 08/25/2012 0335   CALCIUM 9.2 08/25/2012 0335   GFRNONAA 77* 08/25/2012 0335   GFRAA 90* 08/25/2012 0335

## 2012-08-25 NOTE — Procedures (Signed)
Extubation Procedure Note  Patient Details:   Name: Jacqueline [name] DOB: 01/23/1929 MRN: [id number]   Airway Documentation:  Airway 8 mm (Active)  Secured at (cm) 22 cm 08/25/2012  7:50 PM  Measured From Lips 08/25/2012  7:50 PM  Secured Location Right 08/25/2012  7:50 PM  Secured By Pink Tape 08/25/2012  7:50 PM  Site Condition Dry 08/25/2012  7:50 PM    Evaluation  O2 sats: stable throughout Complications: No apparent complications Patient did tolerate procedure well. Bilateral Breath Sounds: Clear Suctioning: Airway Yes  Pt. Was extubated to a 4L [place] without any complications, dyspnea or stridor noted. Pt. Achieved a goal of 1 L on VC, -20 on NIF & 750 mL x 5 on IS.   Daelyn Mozer, Margaretmary Dys 08/25/2012, 8:30 PM

## 2012-08-25 NOTE — Anesthesia Postprocedure Evaluation (Signed)
  Anesthesia Post-op Note  Patient: Jacqueline [name]  Procedure(s) Performed: Procedure(s) with comments: CORONARY ARTERY BYPASS GRAFTING  (N/A) - four bypasses total   Patient Location: ICU  Anesthesia Type:General  Level of Consciousness: Patient remains intubated per anesthesia plan  Airway and Oxygen Therapy: Patient remains intubated per anesthesia plan and Patient placed on Ventilator (see vital sign flow sheet for setting)  Post-op Pain: none  Post-op Assessment: Post-op Vital signs reviewed, Patient's Cardiovascular Status Stable, Respiratory Function Stable, Patent Airway, No signs of Nausea or vomiting and Pain level controlled  Post-op Vital Signs: stable  Complications: No apparent anesthesia complications

## 2012-08-26 ENCOUNTER — Encounter (HOSPITAL_COMMUNITY): Payer: Self-pay | Admitting: Surgery

## 2012-08-26 ENCOUNTER — Inpatient Hospital Stay (HOSPITAL_COMMUNITY): Payer: Medicare HMO

## 2012-08-26 LAB — CBC
Hemoglobin: 8.9 g/dL — ABNORMAL LOW (ref 12.0–15.0)
Hemoglobin: 8.9 g/dL — ABNORMAL LOW (ref 12.0–15.0)
MCH: 31.8 pg (ref 26.0–34.0)
MCV: 89.6 fL (ref 78.0–100.0)
RBC: 2.8 MIL/uL — ABNORMAL LOW (ref 3.87–5.11)
RBC: 2.82 MIL/uL — ABNORMAL LOW (ref 3.87–5.11)

## 2012-08-26 LAB — GLUCOSE, CAPILLARY
Glucose-Capillary: 101 mg/dL — ABNORMAL HIGH (ref 70–99)
Glucose-Capillary: 114 mg/dL — ABNORMAL HIGH (ref 70–99)
Glucose-Capillary: 123 mg/dL — ABNORMAL HIGH (ref 70–99)
Glucose-Capillary: 135 mg/dL — ABNORMAL HIGH (ref 70–99)
Glucose-Capillary: 165 mg/dL — ABNORMAL HIGH (ref 70–99)
Glucose-Capillary: 98 mg/dL (ref 70–99)
Glucose-Capillary: 148 mg/dL — ABNORMAL HIGH (ref 70–99)

## 2012-08-26 LAB — TYPE AND SCREEN
ABO/RH(D): A NEG
Antibody Screen: NEGATIVE
Unit division: 0

## 2012-08-26 LAB — POCT I-STAT, CHEM 8
BUN: 9 mg/dL (ref 6–23)
Sodium: 137 mEq/L (ref 135–145)
TCO2: 24 mmol/L (ref 0–100)

## 2012-08-26 LAB — BASIC METABOLIC PANEL
CO2: 25 mEq/L (ref 19–32)
Calcium: 7 mg/dL — ABNORMAL LOW (ref 8.4–10.5)
Creatinine, Ser: 0.58 mg/dL (ref 0.50–1.10)
Glucose, Bld: 101 mg/dL — ABNORMAL HIGH (ref 70–99)

## 2012-08-26 LAB — CREATININE, SERUM
Creatinine, Ser: 0.65 mg/dL (ref 0.50–1.10)
GFR calc non Af Amer: 81 mL/min — ABNORMAL LOW (ref >90)

## 2012-08-26 LAB — MAGNESIUM: Magnesium: 2.2 mg/dL (ref 1.5–2.5)

## 2012-08-26 MED ORDER — FENTANYL CITRATE 0.05 MG/ML IJ SOLN: 25.0000 ug | INTRAMUSCULAR | Status: DC | PRN

## 2012-08-26 MED ORDER — INSULIN ASPART 100 UNIT/ML ~~LOC~~ SOLN
0.0000 [IU] | SUBCUTANEOUS | Status: DC
  Administered 2012-08-26: 2 [IU] via SUBCUTANEOUS

## 2012-08-26 MED ORDER — POTASSIUM CHLORIDE 10 MEQ/50ML IV SOLN
10.0000 meq | INTRAVENOUS | Status: AC | PRN
  Administered 2012-08-26 (×3): 10 meq via INTRAVENOUS

## 2012-08-26 MED ORDER — INSULIN ASPART 100 UNIT/ML ~~LOC~~ SOLN: 0.0000 [IU] | SUBCUTANEOUS | Status: AC

## 2012-08-26 MED ORDER — TRAMADOL HCL 50 MG PO TABS
50.0000 mg | ORAL_TABLET | Freq: Four times a day (QID) | ORAL | Status: DC | PRN
  Administered 2012-08-26 (×2): 50 mg via ORAL
  Administered 2012-08-27 (×2): 100 mg via ORAL
  Filled 2012-08-26: qty 2
  Filled 2012-08-26: qty 1
  Filled 2012-08-26: qty 2
  Filled 2012-08-26: qty 1
  Filled 2012-08-26: qty 2

## 2012-08-26 MED FILL — Heparin Sodium (Porcine) Inj 1000 Unit/ML: INTRAMUSCULAR | Qty: 30 | Status: AC

## 2012-08-26 MED FILL — Sodium Chloride IV Soln 0.9%: INTRAVENOUS | Qty: 1000 | Status: AC

## 2012-08-26 MED FILL — Potassium Chloride Inj 2 mEq/ML: INTRAVENOUS | Qty: 40 | Status: AC

## 2012-08-26 MED FILL — Magnesium Sulfate Inj 50%: INTRAMUSCULAR | Qty: 10 | Status: AC

## 2012-08-26 NOTE — Progress Notes (Signed)
1 Day Post-Op Procedure(s) (LRB): CORONARY ARTERY BYPASS GRAFTING  (N/A) Subjective: Sore but otherwise feels ok  Objective: Vital signs in last 24 hours: Temp:  [96.4 F (35.8 C)-99 F (37.2 C)] 99 F (37.2 C) (05/16 0600) Pulse Rate:  [79-92] 91 (05/16 0600) Cardiac Rhythm:  [-] A-V Sequential paced (05/16 0400) Resp:  [9-31] 23 (05/16 0600) BP: (88-132)/(48-74) 93/74 mmHg (05/16 0600) SpO2:  [98 %-100 %] 100 % (05/16 0600) Arterial Line BP: (95-138)/(49-83) 102/52 mmHg (05/16 0600) FiO2 (%):  [36 %-50 %] 36 % (05/15 2030) Weight:  [63.6 kg (140 lb 3.4 oz)] 63.6 kg (140 lb 3.4 oz) (05/16 0500)  Hemodynamic parameters for last 24 hours: PAP: (17-28)/(6-17) 21/10 mmHg CO:  [2 L/min-3.9 L/min] 3.9 L/min CI:  [1.2 L/min/m2-2.4 L/min/m2] 2.4 L/min/m2  Intake/Output from previous day: 05/15 0701 - 05/16 0700 In: 5625.6 [I.V.:3750.6; Blood:445; NG/GT:30; IV Piggyback:1400] Out: 8091 [Urine:5440; Emesis/NG output:50; Blood:2171; Chest Tube:430] Intake/Output this shift:    General appearance: alert and cooperative Neurologic: intact Heart: regular rate and rhythm, S1, S2 normal, no murmur, click, rub or gallop Lungs: clear to auscultation bilaterally Extremities: edema mild Wound: dressing dry.  Lab Results:  Recent Labs  08/25/12 2000 08/25/12 2017 08/26/12 0415  WBC 14.0*  --  9.0  HGB 10.8* 10.9* 8.9*  HCT 30.9* 32.0* 25.1*  PLT 187  --  164   BMET:  Recent Labs  08/25/12 0335  08/25/12 2017 08/26/12 0415  NA 140  < > 138 134*  [name] 4.0  < > 3.8 3.4*  CL 105  --  105 102  CO2 22  --   --  25  GLUCOSE 94  < > 163* 101*  BUN 14  --  10 8  CREATININE 0.75  < > 0.70 0.58  CALCIUM 9.2  --   --  7.0*  < > = values in this interval not displayed.  PT/INR:  Recent Labs  08/25/12 1400  LABPROT 16.5*  INR 1.37   ABG    Component Value Date/Time   PHART 7.326* 08/25/2012 2200   HCO3 17.0* 08/25/2012 2200   TCO2 18 08/25/2012 2200   ACIDBASEDEF 8.0* 08/25/2012  2200   O2SAT 97.0 08/25/2012 2200   CBG (last 3)   Recent Labs  08/25/12 1724 08/25/12 1813 08/25/12 1926  GLUCAP 88 122* 100*   CXR: clear  ECG: sinus 60's  Assessment/Plan: S/P Procedure(s) (LRB): CORONARY ARTERY BYPASS GRAFTING  (N/A) Mobilize Diuresis Diabetes control d/c tubes/lines See progression orders   LOS: 8 days    Jacqueline [name],Jacqueline [name] 08/26/2012

## 2012-08-26 NOTE — Progress Notes (Signed)
POD # 1 CABG  Up in chair  BP 102/61  Pulse 80  Temp(Src) 97.8 F (36.6 C) (Oral)  Resp 26  Ht 5\' 6"  (1.676 m)  Wt 140 lb 3.4 oz (63.6 kg)  BMI 22.64 kg/m2  SpO2 94%   Intake/Output Summary (Last 24 hours) at 08/26/12 1817 Last data filed at 08/26/12 1600  Gross per 24 hour  Intake 1845.96 ml  Output   2965 ml  Net -1119.04 ml   CBG well controlled K= 3.9 Hct 26  Continue current care

## 2012-08-27 ENCOUNTER — Inpatient Hospital Stay (HOSPITAL_COMMUNITY): Payer: Medicare HMO

## 2012-08-27 LAB — BASIC METABOLIC PANEL WITH GFR
BUN: 10 mg/dL (ref 6–23)
CO2: 23 meq/L (ref 19–32)
Calcium: 7.3 mg/dL — ABNORMAL LOW (ref 8.4–10.5)
Chloride: 103 meq/L (ref 96–112)
Creatinine, Ser: 0.62 mg/dL (ref 0.50–1.10)
GFR calc Af Amer: >90 mL/min
GFR calc non Af Amer: 82 mL/min — ABNORMAL LOW
Glucose, Bld: 106 mg/dL — ABNORMAL HIGH (ref 70–99)
Potassium: 3.7 meq/L (ref 3.5–5.1)
Sodium: 135 meq/L (ref 135–145)

## 2012-08-27 LAB — GLUCOSE, CAPILLARY: Glucose-Capillary: 77 mg/dL (ref 70–99)

## 2012-08-27 LAB — CBC
HCT: 25.6 % — ABNORMAL LOW (ref 36.0–46.0)
Hemoglobin: 8.8 g/dL — ABNORMAL LOW (ref 12.0–15.0)
MCH: 31.4 pg (ref 26.0–34.0)
MCHC: 34.4 g/dL (ref 30.0–36.0)
MCV: 91.4 fL (ref 78.0–100.0)
Platelets: 172 K/uL (ref 150–400)
RBC: 2.8 MIL/uL — ABNORMAL LOW (ref 3.87–5.11)
RDW: 16.8 % — ABNORMAL HIGH (ref 11.5–15.5)
WBC: 8 K/uL (ref 4.0–10.5)

## 2012-08-27 MED ORDER — POTASSIUM CHLORIDE 10 MEQ/50ML IV SOLN
10.0000 meq | INTRAVENOUS | Status: AC
  Administered 2012-08-27 (×4): 10 meq via INTRAVENOUS
  Filled 2012-08-27: qty 200

## 2012-08-27 MED ORDER — FUROSEMIDE 40 MG PO TABS
40.0000 mg | ORAL_TABLET | Freq: Every day | ORAL | Status: DC
  Administered 2012-08-27 – 2012-08-29 (×3): 40 mg via ORAL
  Filled 2012-08-27 (×4): qty 1

## 2012-08-27 MED ORDER — METOCLOPRAMIDE HCL 5 MG/ML IJ SOLN
10.0000 mg | Freq: Four times a day (QID) | INTRAMUSCULAR | Status: AC
  Administered 2012-08-27 – 2012-08-28 (×4): 10 mg via INTRAVENOUS
  Filled 2012-08-27 (×4): qty 2

## 2012-08-27 MED ORDER — INSULIN ASPART 100 UNIT/ML ~~LOC~~ SOLN
0.0000 [IU] | Freq: Three times a day (TID) | SUBCUTANEOUS | Status: DC
  Administered 2012-08-28: 3 [IU] via SUBCUTANEOUS

## 2012-08-27 NOTE — Progress Notes (Signed)
Dr. Dorris Fetch made aware of refusal to ambulate, and increased nausea/vomiting.

## 2012-08-27 NOTE — Progress Notes (Signed)
2 Days Post-Op Procedure(s) (LRB): CORONARY ARTERY BYPASS GRAFTING  (N/A) Subjective: C/o nausea, emesis x 2 this AM Mild incisional; pain  Objective: Vital signs in last 24 hours: Temp:  [97.3 F (36.3 C)-97.8 F (36.6 C)] 97.6 F (36.4 C) (05/17 0727) Pulse Rate:  [79-95] 95 (05/17 0800) Cardiac Rhythm:  [-] Atrial paced (05/16 2000) Resp:  [19-31] 27 (05/17 0800) BP: (80-146)/(48-83) 120/65 mmHg (05/17 0800) SpO2:  [83 %-100 %] 93 % (05/17 0800) Arterial Line BP: (125)/(53) 125/53 mmHg (05/16 1000) Weight:  [139 lb 15.9 oz (63.5 kg)] 139 lb 15.9 oz (63.5 kg) (05/17 0500)  Hemodynamic parameters for last 24 hours:    Intake/Output from previous day: 05/16 0701 - 05/17 0700 In: 1070 [P.O.:490; I.V.:480; IV Piggyback:100] Out: 1000 [Urine:970; Chest Tube:30] Intake/Output this shift: Total I/O In: 160 [P.O.:120; I.V.:40] Out: 30 [Urine:30]  General appearance: alert and no distress Neurologic: intact Heart: regular rate and rhythm Lungs: diminished breath sounds bibasilar Abdomen: normal findings: soft, non-tender  Lab Results:  Recent Labs  08/26/12 1700 08/26/12 1713 08/27/12 0439  WBC 8.2  --  8.0  HGB 8.9* 8.8* 8.8*  HCT 25.6* 26.0* 25.6*  PLT 160  --  172   BMET:  Recent Labs  08/26/12 0415  08/26/12 1713 08/27/12 0439  NA 134*  --  137 135  K 3.4*  --  3.9 3.7  CL 102  --  102 103  CO2 25  --   --  23  GLUCOSE 101*  --  113* 106*  BUN 8  --  9 10  CREATININE 0.58  < > 0.70 0.62  CALCIUM 7.0*  --   --  7.3*  < > = values in this interval not displayed.  PT/INR:  Recent Labs  08/25/12 1400  LABPROT 16.5*  INR 1.37   ABG    Component Value Date/Time   PHART 7.326* 08/25/2012 2200   HCO3 17.0* 08/25/2012 2200   TCO2 24 08/26/2012 1713   ACIDBASEDEF 8.0* 08/25/2012 2200   O2SAT 97.0 08/25/2012 2200   CBG (last 3)   Recent Labs  08/26/12 2352 08/27/12 0403 08/27/12 0724  GLUCAP 77 97 120*    Assessment/Plan: S/P Procedure(s)  (LRB): CORONARY ARTERY BYPASS GRAFTING  (N/A) -POD # 2  CV- stable, AP @ 90, BP Ok  RESP- pulmonary toilet  RENAL- supplement K, diurese  GI - c/o nausea, better after back in bed- will give reglan x 24 hours  Deconditioning- OOB, ambulate  CBG well controlled   LOS: 9 days    Thuy Atilano C 08/27/2012

## 2012-08-27 NOTE — Progress Notes (Signed)
No complaints  In good spirits this PM  BP 115/74  Pulse 91  Temp(Src) 97.9 F (36.6 C) (Oral)  Resp 20  Ht 5\' 6"  (1.676 m)  Wt 139 lb 15.9 oz (63.5 kg)  BMI 22.61 kg/m2  SpO2 96%   Intake/Output Summary (Last 24 hours) at 08/27/12 1945 Last data filed at 08/27/12 1851  Gross per 24 hour  Intake   1040 ml  Output   3180 ml  Net  -2140 ml   Diuresing well  Ambulated today

## 2012-08-27 NOTE — Progress Notes (Signed)
PROGRESS NOTE  Subjective:   79 yo with CAD - s/p CABG 5/15. Nauseated. Atrial pacing   Objective:    Vital Signs:   Temp:  [97.3 F (36.3 C)-97.8 F (36.6 C)] 97.6 F (36.4 C) (05/17 0727) Pulse Rate:  [79-95] 90 (05/17 1000) Resp:  [19-31] 23 (05/17 1000) BP: (80-146)/(48-83) 126/71 mmHg (05/17 1000) SpO2:  [83 %-100 %] 100 % (05/17 1000) Weight:  [139 lb 15.9 oz (63.5 kg)] 139 lb 15.9 oz (63.5 kg) (05/17 0500)  Last BM Date: 08/26/12   24-hour weight change: Weight change: -3.5 oz (-0.1 kg)  Weight trends: Filed Weights   08/25/12 0500 08/26/12 0500 08/27/12 0500  Weight: 124 lb 4.8 oz (56.382 kg) 140 lb 3.4 oz (63.6 kg) 139 lb 15.9 oz (63.5 kg)    Intake/Output:  05/16 0701 - 05/17 0700 In: 1070 [P.O.:490; I.V.:480; IV Piggyback:100] Out: 1000 [Urine:970; Chest Tube:30] Total I/O In: 230 [P.O.:120; I.V.:60; IV Piggyback:50] Out: 110 [Urine:110]   Physical Exam: BP 126/71  Pulse 90  Temp(Src) 97.6 F (36.4 C) (Oral)  Resp 23  Ht 5\' 6"  (1.676 m)  Wt 139 lb 15.9 oz (63.5 kg)  BMI 22.61 kg/m2  SpO2 100%  General: Vital signs reviewed and noted.   Head: Normocephalic, atraumatic.  Eyes: conjunctivae/corneas clear.  EOM's intact.   Throat: normal  Neck:  normal  Lungs:  clear  Heart:  RR  Abdomen:  Soft, non-tender, non-distended    Extremities: No edema   Neurologic: A&O X3, CN II - XII are grossly intact.   Psych: Normal     Labs: BMET:  Recent Labs  08/26/12 0415 08/26/12 1700 08/26/12 1713 08/27/12 0439  NA 134*  --  137 135  K 3.4*  --  3.9 3.7  CL 102  --  102 103  CO2 25  --   --  23  GLUCOSE 101*  --  113* 106*  BUN 8  --  9 10  CREATININE 0.58 0.65 0.70 0.62  CALCIUM 7.0*  --   --  7.3*  MG 2.3 2.2  --   --     Liver function tests: No results found for this basename: AST, ALT, ALKPHOS, BILITOT, PROT, ALBUMIN,  in the last 72 hours No results found for this basename: LIPASE, AMYLASE,  in the last 72  hours  CBC:  Recent Labs  08/26/12 1700 08/26/12 1713 08/27/12 0439  WBC 8.2  --  8.0  HGB 8.9* 8.8* 8.8*  HCT 25.6* 26.0* 25.6*  MCV 90.8  --  91.4  PLT 160  --  172    Cardiac Enzymes: No results found for this basename: CKTOTAL, CKMB, TROPONINI,  in the last 72 hours  Coagulation Studies:  Recent Labs  08/25/12 1400  LABPROT 16.5*  INR 1.37    Other: No components found with this basename: POCBNP,  No results found for this basename: DDIMER,  in the last 72 hours  Recent Labs  08/25/12 0335  HGBA1C 5.4   No results found for this basename: CHOL, HDL, LDLCALC, TRIG, CHOLHDL,  in the last 72 hours No results found for this basename: TSH, T4TOTAL, FREET3, T3FREE, THYROIDAB,  in the last 72 hours No results found for this basename: VITAMINB12, FOLATE, FERRITIN, TIBC, IRON, RETICCTPCT,  in the last 72 hours   Other results:  Tele:  Atrial pacing    Medications:    Infusions: . sodium chloride 20 mL/hr at 08/25/12 2300  . sodium chloride 20  mL/hr (08/25/12 1400)  . sodium chloride 250 mL (08/26/12 0604)  . dexmedetomidine Stopped (08/25/12 1727)  . lactated ringers 20 mL/hr at 08/26/12 0300  . nitroGLYCERIN Stopped (08/25/12 1515)  . phenylephrine (NEO-SYNEPHRINE) Adult infusion Stopped (08/26/12 0400)    Scheduled Medications: . acetaminophen  1,000 mg Oral Q6H   Or  . acetaminophen (TYLENOL) oral liquid 160 mg/5 mL  975 mg Per Tube Q6H  . amiodarone  200 mg Oral Daily  . aspirin EC  325 mg Oral Daily   Or  . aspirin  324 mg Per Tube Daily  . atorvastatin  40 mg Oral q1800  . bisacodyl  10 mg Oral Daily   Or  . bisacodyl  10 mg Rectal Daily  . Chlorhexidine Gluconate Cloth  6 each Topical Daily  . docusate sodium  200 mg Oral Daily  . furosemide  40 mg Oral Daily  . insulin aspart  0-15 Units Subcutaneous TID WC  . metoCLOPramide (REGLAN) injection  10 mg Intravenous Q6H  . metoprolol tartrate  12.5 mg Oral BID   Or  . metoprolol tartrate   12.5 mg Per Tube BID  . mupirocin ointment  1 application Nasal BID  . pantoprazole  40 mg Oral Daily  . potassium chloride  10 mEq Intravenous Q1 Hr x 4  . sodium chloride  3 mL Intravenous Q12H    Assessment/ Plan:    1. CAD:  S/p CABG.  She is having some post op nausea. Otherwise, she seem to be doing well.  No new recs. She is atrial pacing   Disposition:  Length of Stay: 9  [name], [name]., MD, [facility] 08/27/2012, 10:34 AM Office [phone] Pager [phone]

## 2012-08-27 NOTE — Progress Notes (Signed)
Patient was up OOB in chair at start of shift; up since 0600 am per nurse report. Patient refuses to ambulate hall at this time; stating that she feels "very tired and dizzy." Patient assisted up to standing position x 2 assist, ambulated to bed. Placed back in bed with HOB elevated 30 degrees, bilat heels elevated off mattress surface. Safety maintained. No acute distress noted. VSS.

## 2012-08-27 NOTE — Progress Notes (Signed)
Patient up OOB, ambulating hallway x 2 assist. Safety maintained. No acute distress noted.

## 2012-08-28 ENCOUNTER — Inpatient Hospital Stay (HOSPITAL_COMMUNITY): Payer: Medicare HMO

## 2012-08-28 LAB — CBC
Hemoglobin: 9.6 g/dL — ABNORMAL LOW (ref 12.0–15.0)
MCH: 31.7 pg (ref 26.0–34.0)
MCV: 93.1 fL (ref 78.0–100.0)
RBC: 3.03 MIL/uL — ABNORMAL LOW (ref 3.87–5.11)

## 2012-08-28 LAB — BASIC METABOLIC PANEL
BUN: 10 mg/dL (ref 6–23)
CO2: 28 mEq/L (ref 19–32)
Calcium: 8.3 mg/dL — ABNORMAL LOW (ref 8.4–10.5)
Creatinine, Ser: 0.68 mg/dL (ref 0.50–1.10)
Glucose, Bld: 96 mg/dL (ref 70–99)

## 2012-08-28 LAB — GLUCOSE, CAPILLARY: Glucose-Capillary: 154 mg/dL — ABNORMAL HIGH (ref 70–99)

## 2012-08-28 MED ORDER — ENSURE PO LIQD: 237.0000 mL | Freq: Three times a day (TID) | ORAL | Status: DC

## 2012-08-28 MED ORDER — ENSURE COMPLETE PO LIQD
237.0000 mL | Freq: Three times a day (TID) | ORAL | Status: DC
  Administered 2012-08-28 – 2012-08-31 (×6): 237 mL via ORAL

## 2012-08-28 NOTE — Progress Notes (Signed)
PROGRESS NOTE  Subjective:   79 yo with CAD - s/p CABG 5/15. Nauseated. Atrial pacing   Objective:    Vital Signs:   Temp:  [97.5 F (36.4 C)-98.2 F (36.8 C)] 98.2 F (36.8 C) (05/18 0724) Pulse Rate:  [89-92] 90 (05/18 0800) Resp:  [14-25] 17 (05/18 0800) BP: (84-141)/(50-78) 108/59 mmHg (05/18 0800) SpO2:  [90 %-100 %] 99 % (05/18 0800) Weight:  [131 lb (59.421 kg)] 131 lb (59.421 kg) (05/18 0500)  Last BM Date: 08/26/12   24-hour weight change: Weight change: -8 lb 15.9 oz (-4.079 kg)  Weight trends: Filed Weights   08/26/12 0500 08/27/12 0500 08/28/12 0500  Weight: 140 lb 3.4 oz (63.6 kg) 139 lb 15.9 oz (63.5 kg) 131 lb (59.421 kg)    Intake/Output:  05/17 0701 - 05/18 0700 In: 740 [P.O.:360; I.V.:180; IV Piggyback:200] Out: 4235 [Urine:4235] Total I/O In: 140 [P.O.:120; I.V.:20] Out: -    Physical Exam: BP 108/59  Pulse 90  Temp(Src) 98.2 F (36.8 C) (Oral)  Resp 17  Ht 5\' 6"  (1.676 m)  Wt 131 lb (59.421 kg)  BMI 21.15 kg/m2  SpO2 99%  General: Vital signs reviewed and noted.   Head: Normocephalic, atraumatic.  Eyes: conjunctivae/corneas clear.  EOM's intact.   Throat: normal  Neck:  normal  Lungs:  Decreased breath sounds in bases.   Heart:  RR  Abdomen:  Soft, non-tender, non-distended    Extremities: No edema   Neurologic: A&O X3, CN II - XII are grossly intact.   Psych: Normal     Labs: BMET:  Recent Labs  08/26/12 0415 08/26/12 1700  08/27/12 0439 08/28/12 0435  NA 134*  --   < > 135 138  K 3.4*  --   < > 3.7 3.9  CL 102  --   < > 103 104  CO2 25  --   --  23 28  GLUCOSE 101*  --   < > 106* 96  BUN 8  --   < > 10 10  CREATININE 0.58 0.65  < > 0.62 0.68  CALCIUM 7.0*  --   --  7.3* 8.3*  MG 2.3 2.2  --   --   --   < > = values in this interval not displayed.  Liver function tests: No results found for this basename: AST, ALT, ALKPHOS, BILITOT, PROT, ALBUMIN,  in the last 72 hours No results found for this basename:  LIPASE, AMYLASE,  in the last 72 hours  CBC:  Recent Labs  08/27/12 0439 08/28/12 0435  WBC 8.0 8.5  HGB 8.8* 9.6*  HCT 25.6* 28.2*  MCV 91.4 93.1  PLT 172 209    Cardiac Enzymes: No results found for this basename: CKTOTAL, CKMB, TROPONINI,  in the last 72 hours  Coagulation Studies:  Recent Labs  08/25/12 1400  LABPROT 16.5*  INR 1.37    Other: No components found with this basename: POCBNP,  No results found for this basename: DDIMER,  in the last 72 hours No results found for this basename: HGBA1C,  in the last 72 hours No results found for this basename: CHOL, HDL, LDLCALC, TRIG, CHOLHDL,  in the last 72 hours No results found for this basename: TSH, T4TOTAL, FREET3, T3FREE, THYROIDAB,  in the last 72 hours No results found for this basename: VITAMINB12, FOLATE, FERRITIN, TIBC, IRON, RETICCTPCT,  in the last 72 hours   Other results:  Tele:  Atrial pacing    Medications:  Infusions: . sodium chloride 20 mL/hr at 08/25/12 2300  . sodium chloride 20 mL/hr (08/25/12 1400)  . sodium chloride 250 mL (08/26/12 0604)  . dexmedetomidine Stopped (08/25/12 1727)  . lactated ringers 20 mL/hr at 08/26/12 0300  . nitroGLYCERIN Stopped (08/25/12 1515)  . phenylephrine (NEO-SYNEPHRINE) Adult infusion Stopped (08/26/12 0400)    Scheduled Medications: . acetaminophen  1,000 mg Oral Q6H   Or  . acetaminophen (TYLENOL) oral liquid 160 mg/5 mL  975 mg Per Tube Q6H  . amiodarone  200 mg Oral Daily  . aspirin EC  325 mg Oral Daily   Or  . aspirin  324 mg Per Tube Daily  . atorvastatin  40 mg Oral q1800  . bisacodyl  10 mg Oral Daily   Or  . bisacodyl  10 mg Rectal Daily  . Chlorhexidine Gluconate Cloth  6 each Topical Daily  . docusate sodium  200 mg Oral Daily  . furosemide  40 mg Oral Daily  . insulin aspart  0-15 Units Subcutaneous TID WC  . metoprolol tartrate  12.5 mg Oral BID   Or  . metoprolol tartrate  12.5 mg Per Tube BID  . mupirocin ointment  1  application Nasal BID  . pantoprazole  40 mg Oral Daily  . sodium chloride  3 mL Intravenous Q12H    Assessment/ Plan:    1. CAD:  S/p CABG.  She is having some post op nausea. Otherwise, she seem to be doing well.  No new recs. She is atrial pacing Encouraged IS.  Will be ambulating with nurse soon.  Disposition:  Length of Stay: 10  [name], [name]., MD, [facility] 08/28/2012, 8:52 AM Office [phone] Pager [phone]

## 2012-08-28 NOTE — Progress Notes (Signed)
3 Days Post-Op Procedure(s) (LRB): CORONARY ARTERY BYPASS GRAFTING  (N/A) Subjective: Very tired after walk Poor appetite but says nausea a little better today  Objective: Vital signs in last 24 hours: Temp:  [97.5 F (36.4 C)-98.2 F (36.8 C)] 98.2 F (36.8 C) (05/18 0724) Pulse Rate:  [89-96] 96 (05/18 0900) Cardiac Rhythm:  [-] Atrial paced (05/18 0800) Resp:  [14-25] 25 (05/18 0900) BP: (84-141)/(50-91) 113/91 mmHg (05/18 0900) SpO2:  [90 %-100 %] 95 % (05/18 0900) Weight:  [131 lb (59.421 kg)] 131 lb (59.421 kg) (05/18 0500)  Hemodynamic parameters for last 24 hours:    Intake/Output from previous day: 05/17 0701 - 05/18 0700 In: 740 [P.O.:360; I.V.:180; IV Piggyback:200] Out: 4235 [Urine:4235] Intake/Output this shift: Total I/O In: 160 [P.O.:120; I.V.:40] Out: -   General appearance: alert and no distress Neurologic: intact Heart: regular rate and rhythm Lungs: diminished breath sounds bibasilar Abdomen: normal findings: soft, non-tender  Lab Results:  Recent Labs  08/27/12 0439 08/28/12 0435  WBC 8.0 8.5  HGB 8.8* 9.6*  HCT 25.6* 28.2*  PLT 172 209   BMET:  Recent Labs  08/27/12 0439 08/28/12 0435  NA 135 138  K 3.7 3.9  CL 103 104  CO2 23 28  GLUCOSE 106* 96  BUN 10 10  CREATININE 0.62 0.68  CALCIUM 7.3* 8.3*    PT/INR:  Recent Labs  08/25/12 1400  LABPROT 16.5*  INR 1.37   ABG    Component Value Date/Time   PHART 7.326* 08/25/2012 2200   HCO3 17.0* 08/25/2012 2200   TCO2 24 08/26/2012 1713   ACIDBASEDEF 8.0* 08/25/2012 2200   O2SAT 97.0 08/25/2012 2200   CBG (last 3)   Recent Labs  08/27/12 1720 08/27/12 2256 08/28/12 0722  GLUCAP 124* 122* 115*    Assessment/Plan: S/P Procedure(s) (LRB): CORONARY ARTERY BYPASS GRAFTING  (N/A) POD # 3 CABG  CV- stable, on amiodarone  RESP- continue pulmonary hygiene  RENAL- continue PO lasix  Deconditioning- continue ambulation   LOS: 10 days    Caylie Sandquist  C 08/28/2012

## 2012-08-29 LAB — CBC
HCT: 29.4 % — ABNORMAL LOW (ref 36.0–46.0)
MCV: 94.5 fL (ref 78.0–100.0)
RBC: 3.11 MIL/uL — ABNORMAL LOW (ref 3.87–5.11)
WBC: 9.4 10*3/uL (ref 4.0–10.5)

## 2012-08-29 LAB — BASIC METABOLIC PANEL
CO2: 28 mEq/L (ref 19–32)
Chloride: 103 mEq/L (ref 96–112)
GFR calc Af Amer: >90 mL/min (ref >90)
Potassium: 3.6 mEq/L (ref 3.5–5.1)
Sodium: 138 mEq/L (ref 135–145)

## 2012-08-29 LAB — GLUCOSE, CAPILLARY: Glucose-Capillary: 73 mg/dL (ref 70–99)

## 2012-08-29 MED ORDER — METOPROLOL TARTRATE 12.5 MG HALF TABLET
12.5000 mg | ORAL_TABLET | Freq: Two times a day (BID) | ORAL | Status: DC
  Administered 2012-08-30 – 2012-09-02 (×7): 12.5 mg via ORAL
  Filled 2012-08-29 (×9): qty 1

## 2012-08-29 MED ORDER — MOVING RIGHT ALONG BOOK
Freq: Once | Status: AC
  Administered 2012-08-29: 12:00:00
  Filled 2012-08-29: qty 1

## 2012-08-29 MED ORDER — ASPIRIN EC 325 MG PO TBEC
325.0000 mg | DELAYED_RELEASE_TABLET | Freq: Every day | ORAL | Status: DC
  Administered 2012-08-30 – 2012-09-02 (×4): 325 mg via ORAL
  Filled 2012-08-29 (×4): qty 1

## 2012-08-29 MED ORDER — SODIUM CHLORIDE 0.9 % IV SOLN: 250.0000 mL | INTRAVENOUS | Status: DC | PRN

## 2012-08-29 MED ORDER — ONDANSETRON HCL 4 MG/2ML IJ SOLN: 4.0000 mg | Freq: Four times a day (QID) | INTRAMUSCULAR | Status: DC | PRN

## 2012-08-29 MED ORDER — ACETAMINOPHEN 325 MG PO TABS: 650.0000 mg | ORAL_TABLET | Freq: Four times a day (QID) | ORAL | Status: DC | PRN

## 2012-08-29 MED ORDER — FAMOTIDINE 20 MG PO TABS
20.0000 mg | ORAL_TABLET | Freq: Two times a day (BID) | ORAL | Status: DC
  Administered 2012-08-29 – 2012-09-02 (×9): 20 mg via ORAL
  Filled 2012-08-29 (×10): qty 1

## 2012-08-29 MED ORDER — TRAMADOL HCL 50 MG PO TABS
50.0000 mg | ORAL_TABLET | ORAL | Status: DC | PRN
  Administered 2012-08-30 – 2012-08-31 (×3): 50 mg via ORAL
  Administered 2012-09-01 – 2012-09-02 (×3): 100 mg via ORAL
  Filled 2012-08-29 (×2): qty 2
  Filled 2012-08-29: qty 1
  Filled 2012-08-29 (×2): qty 2
  Filled 2012-08-29: qty 1

## 2012-08-29 MED ORDER — SODIUM CHLORIDE 0.9 % IJ SOLN
3.0000 mL | Freq: Two times a day (BID) | INTRAMUSCULAR | Status: DC
  Administered 2012-08-29 – 2012-09-01 (×6): 3 mL via INTRAVENOUS

## 2012-08-29 MED ORDER — METOCLOPRAMIDE HCL 5 MG PO TABS
5.0000 mg | ORAL_TABLET | Freq: Three times a day (TID) | ORAL | Status: DC
  Administered 2012-08-29 – 2012-09-02 (×11): 5 mg via ORAL
  Filled 2012-08-29 (×14): qty 1

## 2012-08-29 MED ORDER — ONDANSETRON HCL 4 MG PO TABS: 4.0000 mg | ORAL_TABLET | Freq: Four times a day (QID) | ORAL | Status: DC | PRN

## 2012-08-29 MED ORDER — METOPROLOL TARTRATE 12.5 MG HALF TABLET
12.5000 mg | ORAL_TABLET | Freq: Two times a day (BID) | ORAL | Status: DC
  Filled 2012-08-29 (×2): qty 1

## 2012-08-29 MED ORDER — SODIUM CHLORIDE 0.9 % IJ SOLN: 3.0000 mL | INTRAMUSCULAR | Status: DC | PRN

## 2012-08-29 NOTE — Progress Notes (Signed)
Pt doing well now day#4 post CABG (5 vessel). Continued nausea and expected post-op pain. No dyspnea.   Exam shows alert, oriented woman in NAD. She is sitting up in chair. Heart is RRR, lungs clear anteriorly, no peripheral edema.   Tele shows sinus rhythm without significant arrhythmia.  Meds reviewed: ASA, statin, metoprolol, amio.   Pt making good progress. Not much to add from cardiac perspective. She is going to transfer to stepdown today. Appreciate care of the surgical team.  Tonny Bollman 08/29/2012 9:13 AM

## 2012-08-29 NOTE — Progress Notes (Signed)
4 Days Post-Op Procedure(s) (LRB): CORONARY ARTERY BYPASS GRAFTING  (N/A) Subjective:  Only complaint is of nausea. Vomited yesterday. Having some stools. Incontinent but that is a chronic problem for her.  Objective: Vital signs in last 24 hours: Temp:  [97.7 F (36.5 C)-98.8 F (37.1 C)] 98.8 F (37.1 C) (05/19 0700) Pulse Rate:  [63-96] 73 (05/19 0700) Cardiac Rhythm:  [-] Atrial paced (05/18 2000) Resp:  [15-25] 17 (05/19 0700) BP: (85-142)/(47-91) 114/57 mmHg (05/19 0600) SpO2:  [92 %-100 %] 95 % (05/19 0700)  Hemodynamic parameters for last 24 hours:    Intake/Output from previous day: 05/18 0701 - 05/19 0700 In: 880 [P.O.:840; I.V.:40] Out: 38 [Urine:86; Stool:6] Intake/Output this shift:    General appearance: alert and cooperative Neurologic: intact Heart: regular rate and rhythm, S1, S2 normal, no murmur, click, rub or gallop Lungs: rales bibasilar Abdomen: soft, non-tender; bowel sounds normal; no masses,  no organomegaly Extremities: extremities normal, atraumatic, no cyanosis or edema Wound: incisions ok. sternum stable.  Lab Results:  Recent Labs  08/28/12 0435 08/29/12 0555  WBC 8.5 9.4  HGB 9.6* 9.8*  HCT 28.2* 29.4*  PLT 209 255   BMET:  Recent Labs  08/28/12 0435 08/29/12 0555  NA 138 138  K 3.9 3.6  CL 104 103  CO2 28 28  GLUCOSE 96 113*  BUN 10 13  CREATININE 0.68 0.64  CALCIUM 8.3* 8.7    PT/INR: No results found for this basename: LABPROT, INR,  in the last 72 hours ABG    Component Value Date/Time   PHART 7.326* 08/25/2012 2200   HCO3 17.0* 08/25/2012 2200   TCO2 24 08/26/2012 1713   ACIDBASEDEF 8.0* 08/25/2012 2200   O2SAT 97.0 08/25/2012 2200   CBG (last 3)   Recent Labs  08/28/12 1142 08/28/12 1619 08/28/12 2151  GLUCAP 154* 73 132*    Assessment/Plan: S/P Procedure(s) (LRB): CORONARY ARTERY BYPASS GRAFTING  (N/A) Mobilize Diuresis Plan for transfer to step-down: see transfer orders   LOS: 11 days     Jacqueline [name] K 08/29/2012

## 2012-08-29 NOTE — Evaluation (Signed)
Physical Therapy Evaluation Patient Details Name: Jacqueline [name] MRN: [id number] DOB: 26-Mar-1929 Today's Date: 08/29/2012 Time: 4132-4401 PT Time Calculation (min): 27 min  PT Assessment / Plan / Recommendation Clinical Impression  Pt an 79 yo female s/p CABG presenting with sternal precautions and generalized deconditioning. Anticipate pt to be safe for d/c home with 24/7 assist from family and use of rolling walker to adhere to sternal precautions. Pt with good home set up and support.    PT Assessment  Patient needs continued PT services    Follow Up Recommendations  No PT follow up;Supervision/Assistance - 24 hour    Does the patient have the potential to tolerate intense rehabilitation      Barriers to Discharge None      Equipment Recommendations  None recommended by PT    Recommendations for Other Services     Frequency Min 3X/week    Precautions / Restrictions Precautions Precautions: Sternal Precaution Comments: pt verbally educated, v/c's to recall Restrictions Weight Bearing Restrictions:  (sternal precautions, limited UE overhead use)   Pertinent Vitals/Pain Pt denies pain      Mobility  Bed Mobility Bed Mobility: Supine to Sit;Sitting - Scoot to Edge of Bed Supine to Sit: 3: Mod assist;HOB elevated (HOB at 45 deg) Sitting - Scoot to Edge of Bed: 3: Mod assist Details for Bed Mobility Assistance: used lung pillow to minimize use of UEs Transfers Transfers: Sit to Stand;Stand to Sit Sit to Stand: 3: Mod assist;From bed (no UE use, hugged lung pillow) Stand to Sit: 4: Min guard Details for Transfer Assistance: v/c's for rocking technique to minimalize UE use Ambulation/Gait Ambulation/Gait Assistance: 4: Min guard Ambulation Distance (Feet): 100 Feet Assistive device: Rolling walker Ambulation/Gait Assistance Details: pt instructed to only rest UEs on RW, pt with minimal bilat UE pressure Gait Pattern: Step-through pattern Gait velocity: wfl for  surgery General Gait Details: no episode of LOB, mild SOB Stairs: No    Exercises     PT Diagnosis: Generalized weakness  PT Problem List: Decreased strength;Decreased activity tolerance;Decreased mobility PT Treatment Interventions: Gait training;Therapeutic exercise;Therapeutic activities   PT Goals Acute Rehab PT Goals PT Goal Formulation: With patient Time For Goal Achievement: 09/05/12 Potential to Achieve Goals: Good Pt will Roll Supine to Left Side: with modified independence PT Goal: Rolling Supine to Left Side - Progress: Goal set today Pt will go Supine/Side to Sit: with supervision;with HOB 0 degrees (with adherence to sternal precautions) PT Goal: Supine/Side to Sit - Progress: Goal set today Pt will go Sit to Stand: with supervision (while adhereing to sternal precautions.) PT Goal: Sit to Stand - Progress: Goal set today Pt will Ambulate: >150 feet;with supervision;with rolling walker PT Goal: Ambulate - Progress: Goal set today Additional Goals Additional Goal #1: Pt to be independent with recall and compliance with sternal precautions. PT Goal: Additional Goal #1 - Progress: Goal set today  Visit Information  Last PT Received On: 08/29/12 Assistance Needed: +1    Subjective Data  Subjective: Pt received supine in bed with report of ambulating already. Patient Stated Goal: "to do what I was doing before."   Prior Functioning  Home Living Lives With: Son Available Help at Discharge: Family;Available 24 hours/day Type of Home: Apartment Home Access: Level entry Home Layout: One level Bathroom Shower/Tub: Engineer, manufacturing systems: Standard Bathroom Accessibility: Yes How Accessible: Accessible via walker Home Adaptive Equipment: Shower chair with back;Walker - four wheeled;Quad cane Prior Function Level of Independence: Independent Able to Take Stairs?: No  Driving: Yes Vocation: Retired Musician: No difficulties Dominant  Hand: Right    Cognition  Cognition Arousal/Alertness: Awake/alert Behavior During Therapy: WFL for tasks assessed/performed Overall Cognitive Status: Within Functional Limits for tasks assessed    Extremity/Trunk Assessment Right Upper Extremity Assessment RUE ROM/Strength/Tone: Due to precautions;Deficits (sternal) RUE Sensation: WFL - Light Touch Left Upper Extremity Assessment LUE ROM/Strength/Tone: Deficits;Due to precautions (sternal) Right Lower Extremity Assessment RLE ROM/Strength/Tone: WFL for tasks assessed Left Lower Extremity Assessment LLE ROM/Strength/Tone: WFL for tasks assessed Trunk Assessment Trunk Assessment: Normal   Balance    End of Session PT - End of Session Equipment Utilized During Treatment: Gait belt Activity Tolerance: Patient tolerated treatment well Patient left: in chair;with call bell/phone within reach Nurse Communication: Mobility status  GP     Marcene Brawn 08/29/2012, 5:02 PM  Lewis Shock, PT, DPT Pager #: [phone] Office #: [phone]

## 2012-08-29 NOTE — Progress Notes (Signed)
CARDIAC REHAB PHASE I   PRE:  Rate/Rhythm: 70SR  BP:  Supine:   Sitting: 105/74  Standing:    SaO2: 91%RA  MODE:  Ambulation: 150 ft   POST:  Rate/Rhythm: 75SR  BP:  Supine: 110/53   Sitting: 78/56, 79/55  Standing:    SaO2: 97-98%RA 1332-1425 Pt had to sit down 3 times to walk 150 ft. Followed with wheelchair. Pt c/o dizziness and weakness. Encouraged pursed-lip breathing during walk. Pt's BP low upon return to room while pt on BSC. Pt's BP did not get better until she was lying in bed at 110/53. Notified pt's RN of low BP when walking. Pt used rolling walker, asst x 2 and gait belt. Will continue to see as asst x2 until more stable. Lots of encouragement given.   Luetta Nutting, RN BSN  08/29/2012 2:20 PM

## 2012-08-29 NOTE — Progress Notes (Signed)
Pt transferred to 2018 on monitor in wheelchair. Tolerated well. VSS. Shanda Bumps, RN at bedside-updated on pt status from previous report called. Attached to monitor and situated in chair.

## 2012-08-30 MED ORDER — FUROSEMIDE 40 MG PO TABS
40.0000 mg | ORAL_TABLET | Freq: Every day | ORAL | Status: AC
  Administered 2012-08-30 – 2012-09-01 (×3): 40 mg via ORAL
  Filled 2012-08-30 (×3): qty 1

## 2012-08-30 MED ORDER — POTASSIUM CHLORIDE CRYS ER 20 MEQ PO TBCR
40.0000 meq | EXTENDED_RELEASE_TABLET | Freq: Every day | ORAL | Status: AC
  Administered 2012-08-30 – 2012-09-01 (×3): 40 meq via ORAL
  Filled 2012-08-30 (×3): qty 2

## 2012-08-30 NOTE — Progress Notes (Signed)
   [address]       [place] [address]             [phone]    5 Days Post-Op  Procedure(s) (LRB): CORONARY ARTERY BYPASS GRAFTING  (N/A) Subjective: Feels some SOB with ambulation, some sternal incision discomfort  Objective  Telemetry SR   Temp:  [97.9 F (36.6 C)-98.9 F (37.2 C)] 98.9 F (37.2 C) (05/20 0506) Pulse Rate:  [65-74] 70 (05/20 0506) Resp:  [16-23] 20 (05/20 0506) BP: (97-126)/(47-68) 104/62 mmHg (05/20 0506) SpO2:  [91 %-99 %] 91 % (05/20 0506) Weight:  [128 lb 3.2 oz (58.151 kg)-131 lb 6.3 oz (59.6 kg)] 128 lb 3.2 oz (58.151 kg) (05/20 0506)   Intake/Output Summary (Last 24 hours) at 08/30/12 0732 Last data filed at 08/29/12 1630  Gross per 24 hour  Intake    720 ml  Output    852 ml  Net   -132 ml       General appearance: alert, cooperative and no distress Heart: regular rate and rhythm Lungs: mild right basilar crackles Abdomen: benign Extremities: min edema Wound: incisions healing well  Lab Results:  Recent Labs  08/28/12 0435 08/29/12 0555  NA 138 138  K 3.9 3.6  CL 104 103  CO2 28 28  GLUCOSE 96 113*  BUN 10 13  CREATININE 0.68 0.64  CALCIUM 8.3* 8.7   No results found for this basename: AST, ALT, ALKPHOS, BILITOT, PROT, ALBUMIN,  in the last 72 hours No results found for this basename: LIPASE, AMYLASE,  in the last 72 hours  Recent Labs  08/28/12 0435 08/29/12 0555  WBC 8.5 9.4  HGB 9.6* 9.8*  HCT 28.2* 29.4*  MCV 93.1 94.5  PLT 209 255   No results found for this basename: CKTOTAL, CKMB, TROPONINI,  in the last 72 hours No components found with this basename: POCBNP,  No results found for this basename: DDIMER,  in the last 72 hours No results found for this basename: HGBA1C,  in the last 72 hours No results found for this basename: CHOL, HDL, LDLCALC, TRIG, CHOLHDL,  in the last 72 hours No results found for this basename: TSH, T4TOTAL, FREET3, T3FREE, THYROIDAB,  in the last 72 hours No  results found for this basename: VITAMINB12, FOLATE, FERRITIN, TIBC, IRON, RETICCTPCT,  in the last 72 hours  Medications: Scheduled . amiodarone  200 mg Oral Daily  . aspirin EC  325 mg Oral Daily  . atorvastatin  40 mg Oral q1800  . Chlorhexidine Gluconate Cloth  6 each Topical Daily  . famotidine  20 mg Oral BID  . feeding supplement  237 mL Oral TID WC  . metoCLOPramide  5 mg Oral TID AC  . metoprolol tartrate  12.5 mg Oral BID  . mupirocin ointment  1 application Nasal BID  . sodium chloride  3 mL Intravenous Q12H     Radiology/Studies:  No results found.  INR: Will add last result for INR, ABG once components are confirmed Will add last 4 CBG results once components are confirmed  Assessment/Plan: S/P Procedure(s) (LRB): CORONARY ARTERY BYPASS GRAFTING  (N/A)  1 doing well 2 cont gentle diuresis, hemodynamically stable 3 rhythm stable 4 push rehab and pulm toilet    LOS: 12 days    Raziyah Vanvleck E 5/20/20147:32 AM

## 2012-08-30 NOTE — Progress Notes (Signed)
Pt ambulated 150 ft with rolling walker with standby assistance.  Pt tolerated well. Back to chair after ambulation. Jacqueline [name]

## 2012-08-30 NOTE — Progress Notes (Signed)
Occupational Therapy Evaluation Patient Details Name: Jacqueline [name] MRN: [id number] DOB: Oct 14, 1928 Today's Date: 08/30/2012 Time: 4098-1191 OT Time Calculation (min): 32 min  OT Assessment / Plan / Recommendation Clinical Impression  79 yo s/p CABG. PTA, pt independent with ADL and funcitonal moiblty for ADL. Pt plans to return home with family, who can provide 24/7 S. Pt will benefit from skilled OT services to facilitate D/C home with 24/7 assistance due to below deficits.    OT Assessment  Patient needs continued OT Services    Follow Up Recommendations  Home health OT    Barriers to Discharge None    Equipment Recommendations  3 in 1 bedside comode    Recommendations for Other Services    Frequency  Min 2X/week    Precautions / Restrictions Precautions Precautions: Sternal Precaution Comments: reviewed sternal precautions   Pertinent Vitals/Pain *vitals stable".  It only  hurts when I move my arms    ADL  Eating/Feeding: Independent Where Assessed - Eating/Feeding: Chair Grooming: Set up Where Assessed - Grooming: Unsupported standing Upper Body Bathing: Minimal assistance Where Assessed - Upper Body Bathing: Unsupported sitting Lower Body Bathing: Maximal assistance Where Assessed - Lower Body Bathing: Supported sit to stand Upper Body Dressing: Minimal assistance Where Assessed - Upper Body Dressing: Unsupported sitting Lower Body Dressing: Maximal assistance Where Assessed - Lower Body Dressing: Supported sit to stand Toilet Transfer: Minimal assistance Toilet Transfer Method: Sit to Barista: Materials engineer and Hygiene: Supervision/safety Where Assessed - Engineer, mining and Hygiene: Sit to stand from 3-in-1 or toilet Equipment Used: Gait belt Transfers/Ambulation Related to ADLs: Min A ADL Comments: Limited by precautions, SOB and pain    OT Diagnosis: Generalized  weakness;Acute pain  OT Problem List: Decreased strength;Decreased range of motion;Decreased activity tolerance;Decreased safety awareness;Decreased knowledge of use of DME or AE;Decreased knowledge of precautions;Cardiopulmonary status limiting activity;Pain;Impaired UE functional use OT Treatment Interventions: Self-care/ADL training;Energy conservation;DME and/or AE instruction;Therapeutic activities;Balance training   OT Goals Acute Rehab OT Goals OT Goal Formulation: With patient Time For Goal Achievement: 09/13/12 Potential to Achieve Goals: Good ADL Goals Pt Will Perform Lower Body Bathing: with caregiver independent in assisting;with supervision;Unsupported;with adaptive equipment ADL Goal: Lower Body Bathing - Progress: Goal set today Pt Will Perform Lower Body Dressing: with supervision;with caregiver independent in assisting;Sit to stand from chair ADL Goal: Lower Body Dressing - Progress: Goal set today Pt Will Transfer to Toilet: with supervision;with 2+ total assist;Ambulation;with DME;3-in-1 ADL Goal: Toilet Transfer - Progress: Goal set today Additional ADL Goal #1: Pt will verbalize 3 energy conservation techniques for ADL ADL Goal: Additional Goal #1 - Progress: Goal set today Additional ADL Goal #2: Pt will verbalize understanding of sternotomy precuations. ADL Goal: Additional Goal #2 - Progress: Goal set today  Visit Information  Last OT Received On: 08/30/12 Assistance Needed: +1    Subjective Data      Prior Functioning     Home Living Lives With: Son Available Help at Discharge: Family;Available 24 hours/day Type of Home: Apartment Home Access: Level entry Home Layout: One level Bathroom Shower/Tub: Engineer, manufacturing systems: Standard Bathroom Accessibility: Yes How Accessible: Accessible via walker Home Adaptive Equipment: Shower chair with back;Walker - four wheeled;Quad cane Prior Function Level of Independence: Independent Able to Take  Stairs?: No Driving: Yes Vocation: Retired Musician: No difficulties Dominant Hand: Right         Vision/Perception     Cognition  Cognition Arousal/Alertness: Awake/alert Behavior  During Therapy: WFL for tasks assessed/performed Overall Cognitive Status: Within Functional Limits for tasks assessed    Extremity/Trunk Assessment Right Upper Extremity Assessment RUE ROM/Strength/Tone: WFL for tasks assessed RUE Sensation: WFL - Light Touch;WFL - Proprioception RUE Coordination: WFL - gross/fine motor Left Upper Extremity Assessment LUE ROM/Strength/Tone: WFL for tasks assessed LUE Sensation: WFL - Light Touch;WFL - Proprioception LUE Coordination: WFL - gross/fine motor Right Lower Extremity Assessment RLE ROM/Strength/Tone: Deficits RLE ROM/Strength/Tone Deficits: previous hip replacement. limited hip ROM RLE Sensation: WFL - Light Touch;WFL - Proprioception Left Lower Extremity Assessment LLE ROM/Strength/Tone: WFL for tasks assessed Trunk Assessment Trunk Assessment: Normal     Mobility Bed Mobility Bed Mobility: Not assessed Transfers Transfers: Sit to Stand;Stand to Sit Sit to Stand: 4: Min assist;Without upper extremity assist;From chair/3-in-1 Stand to Sit: 4: Min assist;Without upper extremity assist;To chair/3-in-1 Details for Transfer Assistance: use of pillow and cues to increase forward trunk flexion to balance over BOS for increased independence with transfers.     Exercise     Balance Balance Balance Assessed: Yes Static Standing Balance Static Standing - Balance Support: Bilateral upper extremity supported Static Standing - Level of Assistance: 5: Stand by assistance   End of Session OT - End of Session Equipment Utilized During Treatment: Gait belt Activity Tolerance: Patient tolerated treatment well Patient left: in chair;with call bell/phone within reach Nurse Communication: Mobility status  GO      Chanise Habeck,HILLARY 08/30/2012, 4:00 PM [facility], OTR/L  [phone] 08/30/2012

## 2012-08-30 NOTE — Progress Notes (Signed)
Physical Therapy Treatment Patient Details Name: Jacqueline [name] MRN: [id number] DOB: [birth year] Today's Date: 08/30/2012 Time: 1212-1237 PT Time Calculation (min): 25 min  PT Assessment / Plan / Recommendation Comments on Treatment Session  Pt is making excellent progress with mobility. Continue to reinforce sternal precautions with mobility and increase activity tolerance.    Follow Up Recommendations  Home health PT     Does the patient have the potential to tolerate intense rehabilitation     Barriers to Discharge        Equipment Recommendations  None recommended by PT    Recommendations for Other Services    Frequency Min 3X/week   Plan Discharge plan remains appropriate    Precautions / Restrictions Precautions Precautions: Sternal Precaution Comments: reviewed sternal precautions   Pertinent Vitals/Pain     Mobility  Bed Mobility Bed Mobility: Not assessed Transfers Transfers: Sit to Stand;Stand to Sit Sit to Stand: 4: Min assist Stand to Sit: 4: Min assist Details for Transfer Assistance: use of pillow and cues to increase forward trunk flexion to balance over BOS for increased independence with transfers. Ambulation/Gait Ambulation/Gait Assistance: 5: Supervision Ambulation Distance (Feet): 150 Feet Assistive device: Rolling walker Gait Pattern: Step-through pattern;Decreased stride length Gait velocity: decreased Stairs: No    Exercises     PT Diagnosis:    PT Problem List:   PT Treatment Interventions:     PT Goals Acute Rehab PT Goals PT Goal: Sit to Stand - Progress: Progressing toward goal Pt will Ambulate: >150 feet;with modified independence PT Goal: Ambulate - Progress: Updated due to goal met Additional Goals PT Goal: Additional Goal #1 - Progress: Progressing toward goal  Visit Information  Last PT Received On: 08/30/12 Assistance Needed: +1    Subjective Data  Subjective: I am ready to walk.   Cognition   Cognition Arousal/Alertness: Awake/alert Overall Cognitive Status: Within Functional Limits for tasks assessed    Balance  Balance Balance Assessed: Yes Static Standing Balance Static Standing - Balance Support: Bilateral upper extremity supported Static Standing - Level of Assistance: 5: Stand by assistance  End of Session PT - End of Session Equipment Utilized During Treatment: Gait belt Activity Tolerance: Patient tolerated treatment well Patient left: in chair;with call bell/phone within reach;with family/visitor present Nurse Communication: Mobility status   GP     Greggory Stallion 08/30/2012, 12:47 PM

## 2012-08-30 NOTE — Progress Notes (Signed)
CARDIAC REHAB PHASE I   PRE:  Rate/Rhythm: 64 SR    BP: sitting 107/63    SaO2: 94 RA  MODE:  Ambulation: 182 ft   POST:  Rate/Rhythm: 79     BP: sitting 115/70     SaO2: 91 RA  Pt stronger today. Did not have to sit. Able to walk without rest, steady with gait belt, assist x2 (supervision). Prob could have walked even further. Return to recliner. Will f/u. [id number]  Elissa Lovett Clay CES, ACSM 08/30/2012 2:31 PM

## 2012-08-31 NOTE — Progress Notes (Signed)
CARDIAC REHAB PHASE I   PRE:  Rate/Rhythm: 66 SR  BP:  Supine:   Sitting: 120/53  Standing:    SaO2: 94 RA  MODE:  Ambulation: 350 ft   POST:  Rate/Rhythm: 75 SR  BP:  Supine:   Sitting: 134/68  Standing:    SaO2: 92 RA 1355-1425 Assisted X  1 and used walker to ambulate. Gait steady with walker. Pt able to walk 350 feet. VS stable. Pt back to recliner after walk with call light in reach.  Melina Copa RN 08/31/2012 2:21 PM

## 2012-08-31 NOTE — Progress Notes (Signed)
Pt ambulated 400 ft with rolling walker. Standby assist only. Pt tolerated ambulation well. Back to chair after ambulation. Dion Saucier

## 2012-08-31 NOTE — Progress Notes (Signed)
6 Days Post-Op Procedure(s) (LRB): CORONARY ARTERY BYPASS GRAFTING  (N/A) Subjective:  Some difficulty getting up out of chair but otherwise moving well. Did not sleep well due to bad dreams. Eating well. Bowels working.  Objective: Vital signs in last 24 hours: Temp:  [97.5 F (36.4 C)-98.1 F (36.7 C)] 97.5 F (36.4 C) (05/21 0500) Pulse Rate:  [65-74] 70 (05/21 0500) Cardiac Rhythm:  [-] Normal sinus rhythm (05/20 1900) Resp:  [18-20] 20 (05/21 0500) BP: (104-133)/(56-70) 133/70 mmHg (05/21 0500) SpO2:  [93 %-98 %] 96 % (05/21 0500) Weight:  [58.4 kg (128 lb 12 oz)] 58.4 kg (128 lb 12 oz) (05/21 0500)  Hemodynamic parameters for last 24 hours:    Intake/Output from previous day: 05/20 0701 - 05/21 0700 In: 720 [P.O.:720] Out: 1350 [Urine:1350] Intake/Output this shift: Total I/O In: 360 [P.O.:360] Out: 350 [Urine:350]  General appearance: alert and cooperative Neurologic: intact Heart: regular rate and rhythm, S1, S2 normal, no murmur, click, rub or gallop Lungs: clear to auscultation bilaterally Extremities: extremities normal, atraumatic, no cyanosis or edema Wound: incision ok. Sternum stable.  Lab Results:  Recent Labs  08/29/12 0555  WBC 9.4  HGB 9.8*  HCT 29.4*  PLT 255   BMET:  Recent Labs  08/29/12 0555  NA 138  K 3.6  CL 103  CO2 28  GLUCOSE 113*  BUN 13  CREATININE 0.64  CALCIUM 8.7    PT/INR: No results found for this basename: LABPROT, INR,  in the last 72 hours ABG    Component Value Date/Time   PHART 7.326* 08/25/2012 2200   HCO3 17.0* 08/25/2012 2200   TCO2 24 08/26/2012 1713   ACIDBASEDEF 8.0* 08/25/2012 2200   O2SAT 97.0 08/25/2012 2200   CBG (last 3)   Recent Labs  08/28/12 1619 08/28/12 2151 08/29/12 0722  GLUCAP 73 132* 116*    Assessment/Plan: S/P Procedure(s) (LRB): CORONARY ARTERY BYPASS GRAFTING  (N/A) Mobilize Diuresis Plan home Friday with home health PT/OT.    LOS: 13 days    BARTLE,BRYAN  K 08/31/2012

## 2012-08-31 NOTE — Progress Notes (Signed)
Occupational Therapy Treatment Patient Details Name: Jacqueline [name] MRN: [id number] DOB: 1928-04-24 Today's Date: 08/31/2012 Time: 1010-1051 OT Time Calculation (min): 41 min  OT Assessment / Plan / Recommendation Comments on Treatment Session 79 yo s/p CABG. Making steady progress. Pt states she had a "rough night" but felt much better after working with OT. Pt continues to need increased mobility and work with ADL and nec EC techniques and AE. Plan appropriate to D/C home Friday with  Daughter in law who will be able to provide 24/7 S.    Follow Up Recommendations  Home health OT    Barriers to Discharge   none    Equipment Recommendations  3 in 1 bedside comode    Recommendations for Other Services    Frequency Min 2X/week   Plan Discharge plan remains appropriate    Precautions / Restrictions Precautions Precautions: Sternal Precaution Comments: reviewed sternal precautions Restrictions Weight Bearing Restrictions:  (sternal precautions)   Pertinent Vitals/Pain Vitals stable    ADL  Grooming: Supervision/safety Where Assessed - Grooming: Unsupported standing Upper Body Bathing: Set up;Supervision/safety Where Assessed - Upper Body Bathing: Unsupported standing Equipment Used: Rolling walker;Sock aid;Reacher;Gait belt Transfers/Ambulation Related to ADLs: minguard with elevated seat height ADL Comments: Reviewed Energy Conservation techniques. Pt appears to demonstrate difficulty with problem solving using AE. ? STM deficits    OT Diagnosis:    OT Problem List:   OT Treatment Interventions:     OT Goals Acute Rehab OT Goals OT Goal Formulation: With patient Time For Goal Achievement: 09/13/12 Potential to Achieve Goals: Good ADL Goals Pt Will Perform Lower Body Bathing: with caregiver independent in assisting;with supervision;Unsupported;with adaptive equipment ADL Goal: Lower Body Bathing - Progress: Progressing toward goals Pt Will Perform Lower Body  Dressing: with supervision;with caregiver independent in assisting;Sit to stand from chair ADL Goal: Lower Body Dressing - Progress: Progressing toward goals Pt Will Transfer to Toilet: with supervision;Ambulation;with DME;3-in-1;with caregiver independent in assisting ADL Goal: Toilet Transfer - Progress: Progressing toward goals Additional ADL Goal #1: Pt will verbalize 3 energy conservation techniques for ADL ADL Goal: Additional Goal #1 - Progress: Progressing toward goals Additional ADL Goal #2: Pt will verbalize understanding of sternotomy precuations. ADL Goal: Additional Goal #2 - Progress: Met  Visit Information  Last OT Received On: 08/31/12 Assistance Needed: +1    Subjective Data      Prior Functioning       Cognition  Cognition Arousal/Alertness: Awake/alert Behavior During Therapy: WFL for tasks assessed/performed Overall Cognitive Status:  (? STM deficits)    Mobility  Transfers Transfers: Sit to Stand;Stand to Sit Sit to Stand: 4: Min guard;From chair/3-in-1 (with built up chair) Stand to Sit: To chair/3-in-1;5: Supervision Details for Transfer Assistance: vc for technique. built up seat cushion    Exercises      Balance  s   End of Session OT - End of Session Equipment Utilized During Treatment: Gait belt Activity Tolerance: Patient tolerated treatment well Patient left: in chair;with call bell/phone within reach Nurse Communication: Mobility status  GO     Gleb Mcguire,HILLARY 08/31/2012, 11:24 AM Luisa Dago, OTR/L  [phone] 08/31/2012

## 2012-09-01 NOTE — Progress Notes (Signed)
Physical Therapy Treatment Patient Details Name: Undra Trembath MRN: [id number] DOB: [birth year] Today's Date: 09/01/2012 Time: 2440-1027 PT Time Calculation (min): 24 min  PT Assessment / Plan / Recommendation Comments on Treatment Session  Pt cont to make good progress and remembering her sternal percautions    Follow Up Recommendations  Home health PT     Does the patient have the potential to tolerate intense rehabilitation     Barriers to Discharge        Equipment Recommendations  None recommended by PT    Recommendations for Other Services    Frequency Min 3X/week   Plan Discharge plan remains appropriate    Precautions / Restrictions Precautions Precautions: Sternal Precaution Comments: does well remembering sternal percautions   Pertinent Vitals/Pain C/o sternal "discomfort" and R neck pain "stiffness"    Mobility  Bed Mobility Bed Mobility: Supine to Sit;Sit to Supine Supine to Sit: 3: Mod assist;HOB elevated Sitting - Scoot to Edge of Bed: 3: Mod assist Details for Bed Mobility Assistance: used lung pillow to minimize use of UEs Transfers Transfers: Sit to Stand;Stand to Sit Sit to Stand: 4: Min guard;From chair/3-in-1 Stand to Sit: To chair/3-in-1;5: Supervision Details for Transfer Assistance: increased time and momentum Ambulation/Gait Ambulation/Gait Assistance: 5: Supervision Assistive device: Rolling walker Ambulation/Gait Assistance Details: x 2 standing rest breaks Gait Pattern: Step-through pattern;Decreased stride length Gait velocity: decreased     PT Goals                                              Progressing well    Visit Information  Last PT Received On: 09/01/12 Assistance Needed: +1    Subjective Data      Cognition    good   Balance   fair+  End of Session PT - End of Session Equipment Utilized During Treatment: Gait belt Activity Tolerance: Patient tolerated treatment well Patient left: in bed;with call bell/phone  within reach;with nursing in room   Felecia Shelling  PTA [facility]  Acute  Rehab Pager      [phone]

## 2012-09-01 NOTE — Discharge Summary (Signed)
[address]            [place] [address]          [phone]         Discharge Summary  Name: Jacqueline [name] DOB: 1929/02/22 79 y.o. MRN: [id number]   Admission Date: 08/18/2012 Discharge Date:     Admitting Diagnosis: Chest pain   Discharge Diagnosis:  Severe 3 vessel coronary artery disease Expected postoperative blood loss anemia  Past Medical History  Diagnosis Date  . Diverticulitis   . Hypertension   . Osteoporosis   . CAD (coronary artery disease)      Procedures:  CORONARY ARTERY BYPASS GRAFTING x 5  (Left internal mammary artery to left anterior descending, saphenous vein graft to diagonal, sequential saphenous vein graft to first and second obtuse marginals, saphenous vein graft to right coronary artery)  ENDOSCOPIC VEIN HARVEST BILATERAL LEGS - 08/25/2012    HPI:  The patient is a 78 y.o. female who was referred to [facility] for evaluation of chest pain.  She relates a several month history of intermittent substernal chest pain, usually occurring at night.  It usually does not occur with exertion, and there are no associated symptoms.  She had been taking omeprazole without improvement.  On the date of admission, she had an episode which lasted about 20 minutes, then resolved.  She was seen by Dr. Jens Som, then subsequently admitted for further evaluation.   Hospital Course:  The patient was admitted to [facility] on 08/18/2012.  She ruled out for myocardial infarction.  She underwent cardiac catheterization by Dr. Swaziland, which revealed severe multivessel coronary artery disease with good left ventricular function.  A cardiac surgery consult was requested, and Dr. Laneta Simmers saw the patient.  He recommended proceeding with surgical revascularization. All risks, benefits and alternatives of surgery were explained in detail, and the patient agreed to proceed. The patient was taken to the operating room and  underwent the above procedure.    The postoperative course has generally been uneventful.  Her cardiac status has remained stable and she is maintaining sinus rhythm.  She has been mildly volume overloaded and was started on Lasix. She is working with PT and OT on reconditioning, and is progressing well.  She is tolerating a regular diet.  She is stable for discharge home on today's date with home health to follow.     Recent vital signs:  Filed Vitals:   09/01/12 0440  BP: 111/50  Pulse: 66  Temp: 98.6 F (37 C)  Resp: 18    Recent laboratory studies:  CBC:No results found for this basename: WBC, HGB, HCT, PLT,  in the last 72 hours BMET: No results found for this basename: NA, K, CL, CO2, GLUCOSE, BUN, CREATININE, CALCIUM,  in the last 72 hours  PT/INR: No results found for this basename: LABPROT, INR,  in the last 72 hours   Discharge Medications:      Medication List    TAKE these medications       alendronate 70 MG tablet  Commonly known as:  FOSAMAX  TAKE 1 TABLET BY MOUTH EVERY 7 DAYS. TAKE WITH A FULL GLASS OF WATER ON AN EMPTY STOMACH     amiodarone 200 MG tablet  Commonly known as:  PACERONE  Take 1 tablet (200 mg total) by mouth daily.     aspirin EC 325 MG  tablet  Take 325 mg by mouth every evening.     atorvastatin 40 MG tablet  Commonly known as:  LIPITOR  Take 1 tablet (40 mg total) by mouth daily at 6 PM.     folic acid 1 MG tablet  Commonly known as:  FOLVITE  TAKE 1 TABLET (1 MG TOTAL) BY MOUTH DAILY.     lisinopril 2.5 MG tablet  Commonly known as:  PRINIVIL,ZESTRIL  TAKE 1 TABLET (2.5 MG TOTAL) BY MOUTH DAILY.     metoprolol tartrate 25 MG tablet  Commonly known as:  LOPRESSOR  Take 0.5 tablets (12.5 mg total) by mouth 2 (two) times daily.     multivitamin tablet  Take 1 tablet by mouth daily.     omeprazole 20 MG capsule  Commonly known as:  PRILOSEC  Take 1 capsule (20 mg total) by mouth 2 (two) times daily.     traMADol 50 MG  tablet  Commonly known as:  ULTRAM  Take 1-2 tablets (50-100 mg total) by mouth every 4 (four) hours as needed.     zolpidem 5 MG tablet  Commonly known as:  AMBIEN  Take 0.5 tablets (2.5 mg total) by mouth at bedtime as needed for sleep.        The patient has been discharged on:   1.Beta Blocker:  Yes [ x  ]                              No   [   ]                              If No, reason:  2.Ace Inhibitor/ARB: Yes [ x  ]                                     No  [    ]                                     If No, reason:  3.Statin:   Yes [ x  ]                  No  [   ]                  If No, reason:  4.Ecasa:  Yes  [x   ]                  No   [   ]                  If No, reason:      Discharge Instructions:  The patient is to refrain from driving, heavy lifting or strenuous activity.  May shower daily and clean incisions with soap and water.  May resume regular diet.   Follow Up:        Follow-up Information   Follow up with Alleen Borne, MD In 3 weeks. (Office will contact you with an appointment)    Contact information:   [address] [place] [address] [phone]       Follow up with Peter Swaziland, MD. Schedule an appointment as soon as  possible for a visit in 2 weeks.   Contact information:   [address] [place] [address] [phone]        COLLINS,GINA H 09/01/2012, 9:05 AM

## 2012-09-01 NOTE — Progress Notes (Signed)
CARDIAC REHAB PHASE I   PRE:  Rate/Rhythm: 69 SR  BP:  Supine:   Sitting: 117/65  Standing:    SaO2: 88 RA  MODE:  Ambulation: 350 ft   POST:  Rate/Rhythm: 77 SR  BP:  Supine:   Sitting: 103/53  Standing:    SaO2: 93 RA 1455-1520 Assisted X 1 and used walker to ambulate. Gait steady with walker. Pt tires easily and ask to sit in hall. I encouraged standing rest stops. Pt took several standing rest stops then ask to sit again. Pt took one sitting rest stop. She c/o of no energy and being tired. VS stable Pt's color pale. She ask to go back to bed after walk, call light in reach.  Melina Copa RN 09/01/2012 3:18 PM

## 2012-09-01 NOTE — Progress Notes (Addendum)
   [address]       [place] [address]             [phone]    7 Days Post-Op  Procedure(s) (LRB): CORONARY ARTERY BYPASS GRAFTING  (N/A) Subjective: Tearful and upset this am secondary to pain, but nursing says she isnt taking much pain meds.  Objective  Telemetry sinus rhythm  Temp:  [98 F (36.7 C)-98.7 F (37.1 C)] 98.6 F (37 C) (05/22 0440) Pulse Rate:  [66-74] 66 (05/22 0440) Resp:  [18-20] 18 (05/22 0440) BP: (108-123)/(50-77) 111/50 mmHg (05/22 0440) SpO2:  [95 %-97 %] 97 % (05/22 0440) Weight:  [127 lb 6.8 oz (57.8 kg)] 127 lb 6.8 oz (57.8 kg) (05/22 0236)   Intake/Output Summary (Last 24 hours) at 09/01/12 0728 Last data filed at 09/01/12 0600  Gross per 24 hour  Intake    840 ml  Output   2225 ml  Net  -1385 ml       General appearance: alert, cooperative and no distress Heart: regular rate and rhythm Lungs: clear to auscultation bilaterally Abdomen: soft, nontender Extremities: minimal edema Wound: incisions healing well  Lab Results: No results found for this basename: NA, K, CL, CO2, GLUCOSE, BUN, CREATININE, CALCIUM, MG, PHOS,  in the last 72 hours No results found for this basename: AST, ALT, ALKPHOS, BILITOT, PROT, ALBUMIN,  in the last 72 hours No results found for this basename: LIPASE, AMYLASE,  in the last 72 hours No results found for this basename: WBC, NEUTROABS, HGB, HCT, MCV, PLT,  in the last 72 hours No results found for this basename: CKTOTAL, CKMB, TROPONINI,  in the last 72 hours No components found with this basename: POCBNP,  No results found for this basename: DDIMER,  in the last 72 hours No results found for this basename: HGBA1C,  in the last 72 hours No results found for this basename: CHOL, HDL, LDLCALC, TRIG, CHOLHDL,  in the last 72 hours No results found for this basename: TSH, T4TOTAL, FREET3, T3FREE, THYROIDAB,  in the last 72 hours No results found for this basename: VITAMINB12, FOLATE, FERRITIN,  TIBC, IRON, RETICCTPCT,  in the last 72 hours  Medications: Scheduled . amiodarone  200 mg Oral Daily  . aspirin EC  325 mg Oral Daily  . atorvastatin  40 mg Oral q1800  . famotidine  20 mg Oral BID  . feeding supplement  237 mL Oral TID WC  . furosemide  40 mg Oral Daily  . metoCLOPramide  5 mg Oral TID AC  . metoprolol tartrate  12.5 mg Oral BID  . potassium chloride  40 mEq Oral Daily  . sodium chloride  3 mL Intravenous Q12H     Radiology/Studies:  No results found.  INR: Will add last result for INR, ABG once components are confirmed Will add last 4 CBG results once components are confirmed  Assessment/Plan: S/P Procedure(s) (LRB): CORONARY ARTERY BYPASS GRAFTING  (N/A)  1 encouraged to take pain meds more frequently, and she agrees 2 cont pulm toilet and mobilization 3 d/c epw's 4 poss d/c in am    LOS: 14 days    GOLD,WAYNE E 5/22/20147:28 AM   Chart reviewed, patient examined, agree with above.  She feels better this afternoon. Plan home in am if no change.

## 2012-09-01 NOTE — Progress Notes (Signed)
Removed epicardial wires (3 intact) without any ectopic beats.  Pt tolerated the procedure well.  Frequent vitals taken and documented.  Pt on bed rest for one hour.  Call bell within reach.  Will continue to monitor. Thomas Hoff

## 2012-09-02 MED ORDER — ATORVASTATIN CALCIUM 40 MG PO TABS: 40.0000 mg | ORAL_TABLET | Freq: Every day | ORAL | Status: DC

## 2012-09-02 MED ORDER — METOPROLOL TARTRATE 25 MG PO TABS: 12.5000 mg | ORAL_TABLET | Freq: Two times a day (BID) | ORAL | Status: DC

## 2012-09-02 MED ORDER — TRAMADOL HCL 50 MG PO TABS: 50.0000 mg | ORAL_TABLET | ORAL | Status: DC | PRN

## 2012-09-02 NOTE — Progress Notes (Signed)
8 Days Post-Op Procedure(s) (LRB): CORONARY ARTERY BYPASS GRAFTING  (N/A) Subjective:  Jacqueline [name] is without complaints this morning.  She states she feels good and is ready to go home. +BM  Objective: Vital signs in last 24 hours: Temp:  [97.5 F (36.4 C)-98.3 F (36.8 C)] 97.7 F (36.5 C) (05/23 0341) Pulse Rate:  [63-70] 68 (05/23 0341) Cardiac Rhythm:  [-] Normal sinus rhythm (05/22 2045) Resp:  [16-18] 18 (05/23 0341) BP: (110-132)/(55-77) 129/72 mmHg (05/23 0341) SpO2:  [92 %-96 %] 92 % (05/23 0341) Weight:  [123 lb 3.8 oz (55.9 kg)] 123 lb 3.8 oz (55.9 kg) (05/23 0341)  Intake/Output from previous day: 05/22 0701 - 05/23 0700 In: 720 [P.O.:720] Out: 900 [Urine:900] Intake/Output this shift: Total I/O In: -  Out: 100 [Urine:100]  General appearance: alert, cooperative and no distress Neurologic: intact Heart: regular rate and rhythm Lungs: clear to auscultation bilaterally Abdomen: soft, non-tender; bowel sounds normal; no masses,  no organomegaly Extremities: edema trace, LLE EVH site in lower leg mildly ecchymotic with likely underlying hematoma, no signs of cellulitis Wound: clean and dry  Lab Results: No results found for this basename: WBC, HGB, HCT, PLT,  in the last 72 hours BMET: No results found for this basename: NA, K, CL, CO2, GLUCOSE, BUN, CREATININE, CALCIUM,  in the last 72 hours  PT/INR: No results found for this basename: LABPROT, INR,  in the last 72 hours ABG    Component Value Date/Time   PHART 7.326* 08/25/2012 2200   HCO3 17.0* 08/25/2012 2200   TCO2 24 08/26/2012 1713   ACIDBASEDEF 8.0* 08/25/2012 2200   O2SAT 97.0 08/25/2012 2200   CBG (last 3)  No results found for this basename: GLUCAP,  in the last 72 hours  Assessment/Plan: S/P Procedure(s) (LRB): CORONARY ARTERY BYPASS GRAFTING  (N/A)  1. CV- NSR- on Lopressor 2. Pulm- no acute issues, good use of IS 3. Renal- volume status is back to baseline 4. Dispo- patient doing well,  will plan for d/c today    LOS: 15 days    Raford Pitcher, Niki Cosman 09/02/2012

## 2012-09-02 NOTE — Progress Notes (Signed)
Pt/family given discharge instructions, medication lists, follow up appointments, and when to call the doctor.  Pt/family verbalizes understanding. Pt given signs and symptoms of infection. Jacub Waiters [place]    

## 2012-09-02 NOTE — Progress Notes (Signed)
Patient ambulated approximately 150 ft around the circle with RW and RN and tolerated well.  Slow but steady pace, HR stable, no SOB.  Left resting in bed with call bell within reach.  Will continue to monitor.  Arva Chafe

## 2012-09-02 NOTE — Progress Notes (Signed)
4098-1191 Education completed with pt and daughter-in-law who voiced understanding. Both have watched d/c video. Discussed CRP2 and pt does not want referral at this time. Have given her brochure and she will call if she changes her mind. Luetta Nutting RN BSN

## 2012-09-02 NOTE — Progress Notes (Signed)
CT sutures removed without any issues.  Applied benzoin and 1/2" steri strips.  Pt tolerated the procedure well.  Call bell within reach.  Will continue to monitor. Thomas Hoff

## 2012-09-06 ENCOUNTER — Telehealth: Payer: Self-pay | Admitting: General Practice

## 2012-09-06 ENCOUNTER — Telehealth: Payer: Self-pay | Admitting: *Deleted

## 2012-09-06 ENCOUNTER — Telehealth: Payer: Self-pay | Admitting: Cardiology

## 2012-09-06 NOTE — Telephone Encounter (Signed)
Pt had heart surgery. Son called wanting to know if pt needed to remain on the amlodipine that was prescribed at the hospital.

## 2012-09-06 NOTE — Telephone Encounter (Signed)
Spoke with kris, she wanted to know at what heart rate we wanted her to contact us. Explained as long as the pt is feeling fine a heart rate in the 40's is fine.

## 2012-09-06 NOTE — Telephone Encounter (Signed)
New Prob    Went to see pt, BP is 120/60 and HR 54. Would like to speak to nurse in regards to moving forward. Please call.

## 2012-09-06 NOTE — Telephone Encounter (Signed)
[facility] nurse called to report heart rate of 54 and blood pressure of 120/60, asymptomatic.  She is asking what are her parameters for her heart rate.  I called her back and left a message on her phone to consult with Jacqueline [name]'s cardiologist, Dr. Peter Swaziland.

## 2012-09-07 NOTE — Telephone Encounter (Signed)
LM @ (2:42pm) asking the pt's son(Steven) to RTC.  Regarding the pt's meds.//AB/CMA

## 2012-09-07 NOTE — Telephone Encounter (Signed)
i do not see Amlodipine on the discharge med list.  i seen alendronate (fosamax), and amiodarone.  Please clarify w/ family

## 2012-09-12 NOTE — Telephone Encounter (Signed)
Spoke with the pt's son(Steven) and informed him that Dr. Beverely Low stated that the Amlodipine is not on the discharge med list for the pt.  He stated that he did not see it either, so that means she not to be taking it.  Informed him that is right. The son said okay.//AB/CMA

## 2012-09-14 ENCOUNTER — Telehealth: Payer: Self-pay | Admitting: Cardiology

## 2012-09-14 ENCOUNTER — Telehealth: Payer: Self-pay | Admitting: *Deleted

## 2012-09-14 ENCOUNTER — Ambulatory Visit
Admission: RE | Admit: 2012-09-14 | Discharge: 2012-09-14 | Disposition: A | Discharge status: Home / Self Care | Payer: Medicare HMO | Source: Ambulatory Visit | Attending: Nurse Practitioner | Admitting: Nurse Practitioner

## 2012-09-14 ENCOUNTER — Ambulatory Visit (INDEPENDENT_AMBULATORY_CARE_PROVIDER_SITE_OTHER): Payer: Medicare HMO | Admitting: Nurse Practitioner

## 2012-09-14 ENCOUNTER — Other Ambulatory Visit: Payer: Self-pay | Admitting: *Deleted

## 2012-09-14 ENCOUNTER — Encounter: Payer: Self-pay | Admitting: Nurse Practitioner

## 2012-09-14 VITALS — BP 130/80 | HR 65 | Ht 65.0 in | Wt 125.1 lb

## 2012-09-14 DIAGNOSIS — Z951 Presence of aortocoronary bypass graft: Secondary | ICD-10-CM

## 2012-09-14 DIAGNOSIS — R0600 Dyspnea, unspecified: Secondary | ICD-10-CM

## 2012-09-14 DIAGNOSIS — R0609 Other forms of dyspnea: Secondary | ICD-10-CM

## 2012-09-14 DIAGNOSIS — R0789 Other chest pain: Secondary | ICD-10-CM

## 2012-09-14 DIAGNOSIS — R0602 Shortness of breath: Secondary | ICD-10-CM

## 2012-09-14 DIAGNOSIS — R0989 Other specified symptoms and signs involving the circulatory and respiratory systems: Secondary | ICD-10-CM

## 2012-09-14 DIAGNOSIS — Z79899 Other long term (current) drug therapy: Secondary | ICD-10-CM

## 2012-09-14 LAB — CBC WITH DIFFERENTIAL/PLATELET
Basophils Absolute: 0 10*3/uL (ref 0.0–0.1)
Basophils Relative: 0.3 % (ref 0.0–3.0)
Eosinophils Absolute: 0.1 10*3/uL (ref 0.0–0.7)
Eosinophils Relative: 1.8 % (ref 0.0–5.0)
HCT: 32.8 % — ABNORMAL LOW (ref 36.0–46.0)
Hemoglobin: 10.9 g/dL — ABNORMAL LOW (ref 12.0–15.0)
Lymphocytes Relative: 13.2 % (ref 12.0–46.0)
Lymphs Abs: 0.8 10*3/uL (ref 0.7–4.0)
MCHC: 33.2 g/dL (ref 30.0–36.0)
MCV: 100.2 fl — ABNORMAL HIGH (ref 78.0–100.0)
Monocytes Absolute: 0.4 10*3/uL (ref 0.1–1.0)
Monocytes Relative: 7.1 % (ref 3.0–12.0)
Neutro Abs: 4.6 10*3/uL (ref 1.4–7.7)
Neutrophils Relative %: 77.6 % — ABNORMAL HIGH (ref 43.0–77.0)
Platelets: 421 10*3/uL — ABNORMAL HIGH (ref 150.0–400.0)
RBC: 3.27 Mil/uL — ABNORMAL LOW (ref 3.87–5.11)
RDW: 19.9 % — ABNORMAL HIGH (ref 11.5–14.6)
WBC: 5.9 10*3/uL (ref 4.5–10.5)

## 2012-09-14 LAB — BASIC METABOLIC PANEL
BUN: 13 mg/dL (ref 6–23)
CO2: 24 mEq/L (ref 19–32)
Calcium: 9 mg/dL (ref 8.4–10.5)
Chloride: 107 mEq/L (ref 96–112)
Creatinine, Ser: 0.8 mg/dL (ref 0.4–1.2)
GFR: 75.18 mL/min (ref >60.00)
Glucose, Bld: 151 mg/dL — ABNORMAL HIGH (ref 70–99)
Potassium: 3.6 mEq/L (ref 3.5–5.1)
Sodium: 139 mEq/L (ref 135–145)

## 2012-09-14 LAB — TSH: TSH: 0.65 u[IU]/mL (ref 0.35–5.50)

## 2012-09-14 LAB — BRAIN NATRIURETIC PEPTIDE: Pro B Natriuretic peptide (BNP): 220 pg/mL — ABNORMAL HIGH (ref 0.0–100.0)

## 2012-09-14 MED ORDER — FUROSEMIDE 20 MG PO TABS: 20.0000 mg | ORAL_TABLET | Freq: Every day | ORAL | Status: DC

## 2012-09-14 NOTE — Telephone Encounter (Signed)
reutrn pt's call t/w son l will go this morning to get a cxr before ov

## 2012-09-14 NOTE — Telephone Encounter (Signed)
Follow Up  Pt daughter is returning your call.

## 2012-09-14 NOTE — Telephone Encounter (Signed)
S/w pt this is an add on to [place] schedule today, Deliah Goody briefed Lawson Fiscal on pt, Lawson Fiscal requested a cxr before pt comes in Today s/w daughter-in-law stated did not know gso very well and already changed schedule to bring pt to appt will call back to let us know if can do a cxr today this am before 2:00 appt

## 2012-09-14 NOTE — Progress Notes (Signed)
Jacqueline [name] Date of Birth: May 01, 1928 Medical Record #[id number]  History of Present Illness: Jacqueline [name] is seen today for a work in/post hospital visit. Seen for Dr. Jens Som. Has a history of HTN, diverticulitis, osteoporosis and CAD. Most recently admitted with chest pain over the past several months. Cardiac cath was performed. She subsequently underwent CABG x 5 per Dr. Laneta Simmers with LIMA to LAD, SVG to DX, SVG to 1st and 2nd OM, SVG to RCA. Discharged about 2 weeks ago. Post op course appeared unremarkable except for some mild volume overload and deconditioning.   Called earlier today with a multitude of complaints. Nauseated every morning. Vomiting.  Constipated. Short of breath with little exertion. Home health thought her heart rate was irregular. Thus added to my schedule today. I sent her for CXR prior to this visit.   She comes in. She is here with her daughter. She is "sore" in her chest. Only on Tramadol. Got pretty constipated but took Exlax yesterday and her N/V is better today. Appetite has been down. She feels short of breath. Weight is going down but some swelling in her legs. Feels her heart racing at times. I did not see where she had post op atrial fib but she is on amiodarone and it looks like she was on this when she was living in [facility] - that part of the history is not clear. Daughter says that records have been requested. Family thinks she IS getting a little stronger. She has only been home right at 2 weeks.   Current Outpatient Prescriptions on File Prior to Visit  Medication Sig Dispense Refill  . alendronate (FOSAMAX) 70 MG tablet TAKE 1 TABLET BY MOUTH EVERY 7 DAYS. TAKE WITH A FULL GLASS OF WATER ON AN EMPTY STOMACH  4 tablet  3  . amiodarone (PACERONE) 200 MG tablet Take 1 tablet (200 mg total) by mouth daily.  30 tablet  3  . aspirin EC 325 MG tablet Take 325 mg by mouth every evening.       Marland Kitchen atorvastatin (LIPITOR) 40 MG tablet Take 1 tablet (40 mg  total) by mouth daily at 6 PM.  30 tablet  3  . folic acid (FOLVITE) 1 MG tablet TAKE 1 TABLET (1 MG TOTAL) BY MOUTH DAILY.  30 tablet  3  . lisinopril (PRINIVIL,ZESTRIL) 2.5 MG tablet TAKE 1 TABLET (2.5 MG TOTAL) BY MOUTH DAILY.  30 tablet  3  . metoprolol tartrate (LOPRESSOR) 25 MG tablet Take 0.5 tablets (12.5 mg total) by mouth 2 (two) times daily.  60 tablet  3  . Multiple Vitamin (MULTIVITAMIN) tablet Take 1 tablet by mouth daily.      Marland Kitchen omeprazole (PRILOSEC) 20 MG capsule Take 1 capsule (20 mg total) by mouth 2 (two) times daily.  60 capsule  5  . traMADol (ULTRAM) 50 MG tablet Take 1-2 tablets (50-100 mg total) by mouth every 4 (four) hours as needed.  30 tablet  0  . zolpidem (AMBIEN) 5 MG tablet Take 0.5 tablets (2.5 mg total) by mouth at bedtime as needed for sleep.  30 tablet  1   No current facility-administered medications on file prior to visit.    No Known Allergies  Past Medical History  Diagnosis Date  . Diverticulitis   . Hypertension   . Osteoporosis   . CAD (coronary artery disease)     Past Surgical History  Procedure Laterality Date  . Fracture surgery    . Partial colectomy    .  Coronary artery bypass graft N/A 08/25/2012    Procedure: CORONARY ARTERY BYPASS GRAFTING ;  Surgeon: Alleen Borne, MD;  Location: [facility] OR;  Service: Open Heart Surgery;  Laterality: N/A;  four bypasses total     History  Smoking status  . Former Smoker  . Types: Cigarettes  Smokeless tobacco  . Never Used    History  Alcohol Use  . Yes    Comment: Occasional    Family History  Problem Relation Age of Onset  . Hypertension Other   . Cancer Mother     stomach  . Diabetes Neg Hx   . Heart disease Neg Hx   . Stroke Neg Hx     Review of Systems: The review of systems is per the HPI.  All other systems were reviewed and are negative.  Physical Exam: BP 130/80  Pulse 65  Ht 5\' 5"  (1.651 m)  Wt 125 lb 1.9 oz (56.754 kg)  BMI 20.82 kg/m2 Patient is very pleasant  and in no acute distress. She does look frail. Skin is warm and dry. Color is pale.  HEENT is unremarkable. Normocephalic/atraumatic. PERRL. Sclera are nonicteric. Neck is supple. No masses. No JVD. Lungs show some crackles in the bases. Cardiac exam shows a regular rate and rhythm. Outflow murmur noted. Sternum looks ok. Not wearing a bra. Abdomen is soft. She has bowel sounds. Belly is not tender or bloated. Extremities are with trace edema. Arms are bruised. Gait is not tested. She is in a wheelchair. No gross neurologic deficits noted.  LABORATORY DATA: EKG shows sinus rhythm with RBBB.  Lab Results  Component Value Date   WBC 9.4 08/29/2012   HGB 9.8* 08/29/2012   HCT 29.4* 08/29/2012   PLT 255 08/29/2012   GLUCOSE 113* 08/29/2012   CHOL 182 04/28/2012   TRIG 124.0 04/28/2012   HDL 42.40 04/28/2012   LDLCALC 115* 04/28/2012   ALT 13 08/18/2012   AST 20 08/18/2012   NA 138 08/29/2012   K 3.6 08/29/2012   CL 103 08/29/2012   CREATININE 0.64 08/29/2012   BUN 13 08/29/2012   CO2 28 08/29/2012   TSH 1.617 08/18/2012   INR 1.37 08/25/2012   HGBA1C 5.4 08/25/2012    Echo Study Conclusions  - Left ventricle: The cavity size was normal. There was mild to moderate asymmetric hypertrophy. No systolic anterior motion of the mitral valve. Systolic function was normal. The estimated ejection fraction was in the range of 60% to 65%. Wall motion was normal; there were no regional wall motion abnormalities. - Aortic valve: Mildly to moderately calcified annulus. Moderately thickened, mildly calcified leaflets. Cusp separation was moderately reduced. There was mild stenosis. Valve area: 1.57cm^2(VTI). Valve area: 1.38cm^2 (Vmax). - Mitral valve: Calcified annulus. - Left atrium: The atrium was mildly dilated. - Atrial septum: No defect or patent foramen ovale was identified. - Pulmonary arteries: PA peak pressure: 33mm Hg (S).  Dg Chest 2 View  09/14/2012   *RADIOLOGY REPORT*  Clinical Data: Shortness of  breath, CABG on 08/25/2012, follow-up  CHEST - 2 VIEW  Comparison: Portable chest x-ray of 08/28/2012  Findings: The lungs appear better aerated.  There is still a small left pleural effusion present with mild left basilar atelectasis. No pneumothorax is noted.  An apparent skin fold overlies the right mid upper hemithorax.  Cardiomegaly is stable.  Median sternotomy sutures are noted.  The bones are osteopenic.  IMPRESSION: Improved aeration with small left pleural effusion remaining.   Original  Report Authenticated By: Dwyane Dee, M.D.   Assessment / Plan:  1. CAD with recent CABG - slow to progress but sounds like she is making progress- some swelling in her legs - bibasilar rales noted. CXR looked better. I have put her on Lasix 20 mg a day for just 3 days and then stop. Will see her back in 2 to 2/12 weeks. Check follow up labs today as well. She sees Dr. Laneta Simmers next week.   2. High risk drug therapy - not clear to me why she is on the amiodarone - this was not a post op drug - I have left her on it for now. She endorses palpitations. In sinus today.   3. Constipation - better - would suggest Miralax.   4. Mild AS - to follow with echo.  Patient is agreeable to this plan and will call if any problems develop in the interim.   Rosalio Macadamia, RN, ANP-C [facility] [address] [place], [place]  [address]

## 2012-09-14 NOTE — Telephone Encounter (Signed)
New problem    Jacqueline [name] found some problems with pt when she visited pt yesterday sob on excretion, irregular heartrate and bp drops 20 points when standing. Please call Jacqueline [name]

## 2012-09-14 NOTE — Telephone Encounter (Signed)
Spoke with pt, she has been having problems with nausea every morning and vomiting. She was constipated and got relief this morning and did not having any vomiting. The pt denies dizziness or trouble getting around the house. She does get SOB with little exertion in the home. Talked to the home health nurse, she reports the pts SOB is worse than when she first saw her. She did not see any edema. She also reports the pts pulse is very irregular. Her bp went from 130/80 to 110/60 when sitting from lying. Pt will see lori gerhardt np today mat 2 pm.

## 2012-09-14 NOTE — Patient Instructions (Addendum)
I would like for you to try Miralax daily for your bowels  Stay on your current medicines  We will check labs today  I will give you Lasix 20 mg to take daily for the next 3 days only and then STOP - this has been sent to the drug store  See me in 2 weeks  See Dr. Laneta Simmers next week  Call the [facility] office at [phone] if you have any questions, problems or concerns.

## 2012-09-15 ENCOUNTER — Telehealth: Payer: Self-pay | Admitting: *Deleted

## 2012-09-15 NOTE — Telephone Encounter (Signed)
pt notified about lab results improving post op, pt said thank you and that I made her day.

## 2012-09-15 NOTE — Telephone Encounter (Signed)
Message copied by Tarri Fuller on Thu Sep 15, 2012  9:18 AM ------      Message from: Rosalio Macadamia      Created: Thu Sep 15, 2012  9:03 AM       Ok to report. Labs are satisfactory and improving post operatively. ------

## 2012-09-20 ENCOUNTER — Other Ambulatory Visit: Payer: Self-pay | Admitting: *Deleted

## 2012-09-20 DIAGNOSIS — I2510 Atherosclerotic heart disease of native coronary artery without angina pectoris: Secondary | ICD-10-CM

## 2012-09-21 ENCOUNTER — Telehealth: Payer: Self-pay | Admitting: Cardiology

## 2012-09-21 ENCOUNTER — Ambulatory Visit (INDEPENDENT_AMBULATORY_CARE_PROVIDER_SITE_OTHER): Payer: Self-pay | Admitting: Surgery

## 2012-09-21 ENCOUNTER — Encounter: Payer: Self-pay | Admitting: Surgery

## 2012-09-21 VITALS — BP 124/69 | HR 67 | Resp 18 | Ht 65.0 in | Wt 145.0 lb

## 2012-09-21 DIAGNOSIS — Z951 Presence of aortocoronary bypass graft: Secondary | ICD-10-CM

## 2012-09-21 DIAGNOSIS — I2510 Atherosclerotic heart disease of native coronary artery without angina pectoris: Secondary | ICD-10-CM

## 2012-09-21 NOTE — Progress Notes (Signed)
[address]       [place] [address]             [phone]        HPI:  Patient returns for routine postoperative follow-up having undergone coronary bypass graft surgery x5 on 08/25/2012. The patient's early postoperative recovery while in the hospital was notable for a slow postoperative course due to her advanced age. Since hospital discharge the patient reports that she was initially seen by Norma Fredrickson on 09/14/12 and was not feeling very well at that time with nausea, fatigue, and shortness of breath. Since then she has continued to improve. She still has occasional nausea but that is much better. She is walking with a walker and denies dyspnea.   Current Outpatient Prescriptions  Medication Sig Dispense Refill  . alendronate (FOSAMAX) 70 MG tablet TAKE 1 TABLET BY MOUTH EVERY 7 DAYS. TAKE WITH A FULL GLASS OF WATER ON AN EMPTY STOMACH  4 tablet  3  . amiodarone (PACERONE) 200 MG tablet Take 1 tablet (200 mg total) by mouth daily.  30 tablet  3  . aspirin EC 325 MG tablet Take 325 mg by mouth every evening.       Marland Kitchen atorvastatin (LIPITOR) 40 MG tablet Take 1 tablet (40 mg total) by mouth daily at 6 PM.  30 tablet  3  . folic acid (FOLVITE) 1 MG tablet TAKE 1 TABLET (1 MG TOTAL) BY MOUTH DAILY.  30 tablet  3  . lisinopril (PRINIVIL,ZESTRIL) 2.5 MG tablet TAKE 1 TABLET (2.5 MG TOTAL) BY MOUTH DAILY.  30 tablet  3  . metoprolol tartrate (LOPRESSOR) 25 MG tablet Take 0.5 tablets (12.5 mg total) by mouth 2 (two) times daily.  60 tablet  3  . Multiple Vitamin (MULTIVITAMIN) tablet Take 1 tablet by mouth daily.      Marland Kitchen omeprazole (PRILOSEC) 20 MG capsule Take 1 capsule (20 mg total) by mouth 2 (two) times daily.  60 capsule  5   No current facility-administered medications for this visit.    Physical Exam:  BP 124/69  Pulse 67  Resp 18  Ht 5\' 5"  (1.651 m)  Wt 145 lb (65.772 kg)  BMI 24.13 kg/m2  SpO2 98% She looks well. Cardiac exam shows a regular  rate and rhythm with normal heart sounds. Lung exam is clear. The chest incision is healing well and sternum is stable. Both leg incisions are healing well. There is some ecchymosis over the left thigh from harvesting the saphenous vein. There is mild bilateral ankle edema.  Diagnostic Tests:  *RADIOLOGY REPORT*   Clinical Data: Shortness of breath, CABG on 08/25/2012, follow-up   CHEST - 2 VIEW 09/14/2012   Comparison: Portable chest x-ray of 08/28/2012   Findings: The lungs appear better aerated.  There is still a small left pleural effusion present with mild left basilar atelectasis. No pneumothorax is noted.  An apparent skin fold overlies the right mid upper hemithorax.  Cardiomegaly is stable.  Median sternotomy sutures are noted.  The bones are osteopenic.   IMPRESSION: Improved aeration with small left pleural effusion remaining.     Original Report Authenticated By: Dwyane Dee, M.D.     Impression:  Overall I think she is making good progress considering her age. I encouraged her to continue walking as much as possible. She was on amiodarone preoperatively which had been started while she was still living in [place] [facility].It is not clear why she was  put on that medication but she feels that she has been on it for a prolonged time. Apparently Dr. Ludwig Clarks office was requesting her old medical records from [facility] and hopefully that will shed some light on her history of arrhythmias. I will let Dr. Jens Som decide if he wants to discontinue that.  Plan:  She will continue followup with Dr. Jens Som and will contact me if she develops any problems with her incisions.

## 2012-09-21 NOTE — Telephone Encounter (Signed)
Records Rec From Dr.Girish Daulat office, Will hold Onto Until D.Mathis Comes back  In Office   09/21/12/KM

## 2012-09-22 DIAGNOSIS — I2510 Atherosclerotic heart disease of native coronary artery without angina pectoris: Secondary | ICD-10-CM

## 2012-09-27 ENCOUNTER — Ambulatory Visit (INDEPENDENT_AMBULATORY_CARE_PROVIDER_SITE_OTHER): Payer: Medicare HMO | Admitting: Family Medicine

## 2012-09-27 ENCOUNTER — Telehealth: Payer: Self-pay | Admitting: Cardiology

## 2012-09-27 VITALS — BP 110/80 | HR 63 | Temp 97.7°F | Ht 65.5 in | Wt 125.0 lb

## 2012-09-27 DIAGNOSIS — H6121 Impacted cerumen, right ear: Secondary | ICD-10-CM

## 2012-09-27 DIAGNOSIS — H6120 Impacted cerumen, unspecified ear: Secondary | ICD-10-CM | POA: Insufficient documentation

## 2012-09-27 NOTE — Patient Instructions (Addendum)
Saturate a cotton ball w/ hydrogen peroxide and drip it into the R ear daily If no improvement in 1 week- please call and we can re-evaluate Call with any questions or concerns Hang in there!

## 2012-09-27 NOTE — Assessment & Plan Note (Signed)
New.  L ear now clean but R ear w/ persistent cerumen despite curette, use of H2O2, and irrigation.  Pt having difficulty tolerating the irrigation.  Procedure halted and pt to use H2O2 at home and return in 1 week if no improvement.   Pt expressed understanding and is in agreement w/ plan.

## 2012-09-27 NOTE — Telephone Encounter (Signed)
Returned call to Cecile at [facility] no answer .LMTC.

## 2012-09-27 NOTE — Progress Notes (Signed)
  Subjective:    Patient ID: Roland Rack, female    DOB: January 10, 1929, 79 y.o.   MRN: [id number]  HPI Cerumen impaction- pt saw audiology and has marked hearing loss bilaterally.  She was told that removing some of the wax may be helpful.  Pt reports she has some discomfort when lying on the R side, none on the L.  No drainage from ears.   Review of Systems For ROS see HPI     Objective:   Physical Exam  Vitals reviewed. Constitutional: She appears well-developed and well-nourished. No distress.  HENT:  Head: Normocephalic and atraumatic.  L TM w/ small amount of dry cerumen at opening of EAC- easily removed w/ curette.  TM WNL R TM not visible due to large amount of dry cerumen- some removed w/ curette.  Remainder attempted to irrigate but this was also unsuccessful.          Assessment & Plan:

## 2012-09-27 NOTE — Telephone Encounter (Signed)
New Problem:    Called in to request orders to continue home physical therapy because she is not ready to transfer to cardiac rehab just yet.  Please call back.

## 2012-09-28 NOTE — Telephone Encounter (Signed)
Returned call to Cecile at [facility] she stated patient is not ready for cardiac rehab.Stated she would like a order to extend home care 3 more weeks.Advised ok to extend home care for 3 more weeks.When reviewing patient's chart patient is Dr.Crenshaw's patient not Dr.Jordan's.Message sent to [facility] for advice.

## 2012-09-28 NOTE — Telephone Encounter (Signed)
Follow up  Jacqueline [name] @ [facility] is returning your call regarding the orders for this patient.

## 2012-09-29 NOTE — Telephone Encounter (Signed)
Ok to extend home care [facility]

## 2012-09-30 ENCOUNTER — Encounter: Payer: Self-pay | Admitting: Nurse Practitioner

## 2012-09-30 ENCOUNTER — Ambulatory Visit (INDEPENDENT_AMBULATORY_CARE_PROVIDER_SITE_OTHER): Payer: Medicare HMO | Admitting: Nurse Practitioner

## 2012-09-30 ENCOUNTER — Other Ambulatory Visit: Payer: Self-pay | Admitting: Internal Medicine

## 2012-09-30 VITALS — BP 160/92 | HR 64 | Ht 66.0 in | Wt 124.0 lb

## 2012-09-30 DIAGNOSIS — Z951 Presence of aortocoronary bypass graft: Secondary | ICD-10-CM

## 2012-09-30 DIAGNOSIS — I2510 Atherosclerotic heart disease of native coronary artery without angina pectoris: Secondary | ICD-10-CM

## 2012-09-30 NOTE — Progress Notes (Signed)
Jacqueline [name] Date of Birth: 1928/05/17 Medical Record #[id number]  History of Present Illness: Jacqueline [name] is seen back today for a 2 week check. Seen for Dr. Jens Som. Has a history of HTN, diverticulitis, osteoporosis and CAD. Also has mild AS per echo from May 2014. Most recently admitted with chest pain over the past several months. Cardiac cath was performed. She subsequently underwent CABG x 5 per Dr. Laneta Simmers with LIMA to LAD, SVG to DX, SVG to 1st and 2nd OM, SVG to RCA on 08/25/12. Post op course appeared unremarkable except for some mild volume overload and deconditioning.   Seen 2 weeks ago with a multitude of complaints. She was constipated and having N/V. Some swelling and rales on exam despite her weight being down. CXR was ok. She is on amiodarone and it looks like she was on this when she was living in [facility] - that part of the history is not clear. Daughter says that records have been requested. I thought she was making slow progress and just needed some "time".  She comes back today. She is here with her daughter in law. She has been released from Dr. Laneta Simmers. Seen PCP for cerumen impaction earlier this week. This is what is bothering her the most. She is otherwise doing very well. No chest pain. Not short of breath. Bowels working good. Walking more. Feels pretty good from surgical standpoint.   Records were obtained today for review from Dr. Langston Masker in [facility] - she has a history of SVT and PAF - this is why she is on the amiodarone. She has not had recurrence that she is aware of. Her records also noted an abnormal mammogram. She has not had follow up since moving here.    Current Outpatient Prescriptions on File Prior to Visit  Medication Sig Dispense Refill  . alendronate (FOSAMAX) 70 MG tablet TAKE 1 TABLET BY MOUTH EVERY 7 DAYS. TAKE WITH A FULL GLASS OF WATER ON AN EMPTY STOMACH  4 tablet  3  . amiodarone (PACERONE) 200 MG tablet Take 1 tablet (200 mg total) by  mouth daily.  30 tablet  3  . aspirin EC 325 MG tablet Take 325 mg by mouth every evening.       Marland Kitchen atorvastatin (LIPITOR) 40 MG tablet Take 1 tablet (40 mg total) by mouth daily at 6 PM.  30 tablet  3  . folic acid (FOLVITE) 1 MG tablet TAKE 1 TABLET (1 MG TOTAL) BY MOUTH DAILY.  30 tablet  3  . lisinopril (PRINIVIL,ZESTRIL) 2.5 MG tablet TAKE 1 TABLET (2.5 MG TOTAL) BY MOUTH DAILY.  30 tablet  3  . metoprolol tartrate (LOPRESSOR) 25 MG tablet Take 0.5 tablets (12.5 mg total) by mouth 2 (two) times daily.  60 tablet  3  . Multiple Vitamin (MULTIVITAMIN) tablet Take 1 tablet by mouth daily.      Marland Kitchen omeprazole (PRILOSEC) 20 MG capsule Take 1 capsule (20 mg total) by mouth 2 (two) times daily.  60 capsule  5   No current facility-administered medications on file prior to visit.    No Known Allergies  Past Medical History  Diagnosis Date  . Diverticulitis   . Hypertension   . Osteoporosis   . CAD (coronary artery disease)     Past Surgical History  Procedure Laterality Date  . Fracture surgery    . Partial colectomy    . Coronary artery bypass graft N/A 08/25/2012    Procedure: CORONARY ARTERY BYPASS GRAFTING ;  Surgeon: Alleen Borne, MD;  Location: [facility] OR;  Service: Open Heart Surgery;  Laterality: N/A;  four bypasses total     History  Smoking status  . Former Smoker  . Types: Cigarettes  Smokeless tobacco  . Never Used    History  Alcohol Use  . Yes    Comment: Occasional    Family History  Problem Relation Age of Onset  . Hypertension Other   . Cancer Mother     stomach  . Diabetes Neg Hx   . Heart disease Neg Hx   . Stroke Neg Hx     Review of Systems: The review of systems is per the HPI.  All other systems were reviewed and are negative.  Physical Exam: BP 160/92  Pulse 64  Ht 5\' 6"  (1.676 m)  Wt 124 lb (56.246 kg)  BMI 20.02 kg/m2 Patient is very pleasant and in no acute distress. Looks stronger. Repeat BP by me is 130/70. Skin is warm and dry. Color  is normal.  HEENT is unremarkable. Normocephalic/atraumatic. PERRL. Sclera are nonicteric. Neck is supple. No masses. No JVD. Lungs are clear. Cardiac exam shows a regular rate and rhythm. Soft outflow murmur noted.  Abdomen is soft. Extremities are without edema. Gait and ROM are intact. Using a cane. No gross neurologic deficits noted.  LABORATORY DATA:  Lab Results  Component Value Date   WBC 5.9 09/14/2012   HGB 10.9* 09/14/2012   HCT 32.8* 09/14/2012   PLT 421.0* 09/14/2012   GLUCOSE 151* 09/14/2012   CHOL 182 04/28/2012   TRIG 124.0 04/28/2012   HDL 42.40 04/28/2012   LDLCALC 115* 04/28/2012   ALT 13 08/18/2012   AST 20 08/18/2012   NA 139 09/14/2012   K 3.6 09/14/2012   CL 107 09/14/2012   CREATININE 0.8 09/14/2012   BUN 13 09/14/2012   CO2 24 09/14/2012   TSH 0.65 09/14/2012   INR 1.37 08/25/2012   HGBA1C 5.4 08/25/2012     Assessment / Plan:  1. CAD with CABG in May - released from Dr. Laneta Simmers - she looks like she is making progress. We will see her back in 3 months.   2. High risk drug therapy - on amiodarone for SVT/PAF - I have continued.   3. Mild AS - will need to follow.   4. HTN - no medicines yet today but repeat BP by me is ok.   I think overall she is doing well. See Dr. Jens Som back in 3 months. Continue her current medicines. Encouraged activity.   Patient is agreeable to this plan and will call if any problems develop in the interim.   Rosalio Macadamia, RN, ANP-C [facility] [address] [place], [place]  [address]

## 2012-09-30 NOTE — Patient Instructions (Signed)
I think you are doing well  Stay active  See Dr. Jens Som in 3 months  Stay on your current medicines  Call the [facility] office at [phone] if you have any questions, problems or concerns.

## 2012-10-10 ENCOUNTER — Institutional Professional Consult (permissible substitution): Payer: Medicare PPO | Admitting: Cardiovascular Disease

## 2012-11-02 ENCOUNTER — Emergency Department (HOSPITAL_BASED_OUTPATIENT_CLINIC_OR_DEPARTMENT_OTHER)
Admission: EM | Admit: 2012-11-02 | Discharge: 2012-11-02 | Disposition: A | Discharge status: Home / Self Care | Payer: Medicare HMO | Attending: Emergency Medicine | Admitting: Emergency Medicine

## 2012-11-02 ENCOUNTER — Telehealth: Payer: Self-pay | Admitting: Family Medicine

## 2012-11-02 ENCOUNTER — Encounter (HOSPITAL_BASED_OUTPATIENT_CLINIC_OR_DEPARTMENT_OTHER): Payer: Self-pay | Admitting: *Deleted

## 2012-11-02 ENCOUNTER — Emergency Department (HOSPITAL_BASED_OUTPATIENT_CLINIC_OR_DEPARTMENT_OTHER): Payer: Medicare HMO

## 2012-11-02 DIAGNOSIS — S7001XA Contusion of right hip, initial encounter: Secondary | ICD-10-CM

## 2012-11-02 DIAGNOSIS — S7000XA Contusion of unspecified hip, initial encounter: Secondary | ICD-10-CM | POA: Insufficient documentation

## 2012-11-02 DIAGNOSIS — Y929 Unspecified place or not applicable: Secondary | ICD-10-CM | POA: Insufficient documentation

## 2012-11-02 DIAGNOSIS — Z7982 Long term (current) use of aspirin: Secondary | ICD-10-CM | POA: Insufficient documentation

## 2012-11-02 DIAGNOSIS — Z951 Presence of aortocoronary bypass graft: Secondary | ICD-10-CM | POA: Insufficient documentation

## 2012-11-02 DIAGNOSIS — Z87891 Personal history of nicotine dependence: Secondary | ICD-10-CM | POA: Insufficient documentation

## 2012-11-02 DIAGNOSIS — Z8719 Personal history of other diseases of the digestive system: Secondary | ICD-10-CM | POA: Insufficient documentation

## 2012-11-02 DIAGNOSIS — Z8739 Personal history of other diseases of the musculoskeletal system and connective tissue: Secondary | ICD-10-CM | POA: Insufficient documentation

## 2012-11-02 DIAGNOSIS — Y939 Activity, unspecified: Secondary | ICD-10-CM | POA: Insufficient documentation

## 2012-11-02 DIAGNOSIS — W010XXA Fall on same level from slipping, tripping and stumbling without subsequent striking against object, initial encounter: Secondary | ICD-10-CM | POA: Insufficient documentation

## 2012-11-02 DIAGNOSIS — I10 Essential (primary) hypertension: Secondary | ICD-10-CM | POA: Insufficient documentation

## 2012-11-02 DIAGNOSIS — I2510 Atherosclerotic heart disease of native coronary artery without angina pectoris: Secondary | ICD-10-CM | POA: Insufficient documentation

## 2012-11-02 DIAGNOSIS — Z79899 Other long term (current) drug therapy: Secondary | ICD-10-CM | POA: Insufficient documentation

## 2012-11-02 MED ORDER — TRAMADOL HCL 50 MG PO TABS
50.0000 mg | ORAL_TABLET | Freq: Once | ORAL | Status: AC
Start: 2012-11-02 — End: 2012-11-02
  Administered 2012-11-02: 50 mg via ORAL
  Filled 2012-11-02: qty 1

## 2012-11-02 NOTE — ED Provider Notes (Signed)
History    CSN: [id number] Arrival date & time 11/02/12  1723  First MD Initiated Contact with Patient 11/02/12 1744     Chief Complaint  Patient presents with  . Fall   (Consider location/radiation/quality/duration/timing/severity/associated sxs/prior Treatment) Patient is a 79 y.o. female presenting with fall.  Fall   Pt with history of R hip replacement reports she slipped and fell earlier today landing on her buttocks. States she bumped her head on the way down, but did not have LOC and no headache now. She has had moderate aching pain in R hip since the fall, but able to stand and bear weight but pain is worse with movement. Advised by home health nurse to come to the ED.   Past Medical History  Diagnosis Date  . Diverticulitis   . Hypertension   . Osteoporosis   . CAD (coronary artery disease)    Past Surgical History  Procedure Laterality Date  . Fracture surgery    . Partial colectomy    . Coronary artery bypass graft N/A 08/25/2012    Procedure: CORONARY ARTERY BYPASS GRAFTING ;  Surgeon: Alleen Borne, MD;  Location: [facility] OR;  Service: Open Heart Surgery;  Laterality: N/A;  four bypasses total    Family History  Problem Relation Age of Onset  . Hypertension Other   . Cancer Mother     stomach  . Diabetes Neg Hx   . Heart disease Neg Hx   . Stroke Neg Hx    History  Substance Use Topics  . Smoking status: Former Smoker    Types: Cigarettes  . Smokeless tobacco: Never Used  . Alcohol Use: Yes     Comment: Occasional   OB History   Grav Para Term Preterm Abortions TAB SAB Ect Mult Living                 Review of Systems All other systems reviewed and are negative except as noted in HPI.   Allergies  Review of patient's allergies indicates no known allergies.  Home Medications   Current Outpatient Rx  Name  Route  Sig  Dispense  Refill  . alendronate (FOSAMAX) 70 MG tablet      TAKE 1 TABLET BY MOUTH EVERY 7 DAYS. TAKE WITH A FULL GLASS OF  WATER ON AN EMPTY STOMACH   4 tablet   3   . alendronate (FOSAMAX) 70 MG tablet      TAKE 1 TABLET BY MOUTH EVERY 7 DAYS. TAKE WITH A FULL GLASS OF WATER ON AN EMPTY STOMACH   4 tablet   3   . amiodarone (PACERONE) 200 MG tablet   Oral   Take 1 tablet (200 mg total) by mouth daily.   30 tablet   3   . amiodarone (PACERONE) 200 MG tablet      baity,regina   30 tablet   3   . amLODipine (NORVASC) 10 MG tablet      TAKE 1 TABLET (10 MG TOTAL) BY MOUTH DAILY.   30 tablet   3   . aspirin EC 325 MG tablet   Oral   Take 325 mg by mouth every evening.          Marland Kitchen atorvastatin (LIPITOR) 40 MG tablet   Oral   Take 1 tablet (40 mg total) by mouth daily at 6 PM.   30 tablet   3   . folic acid (FOLVITE) 1 MG tablet      TAKE  1 TABLET (1 MG TOTAL) BY MOUTH DAILY.   30 tablet   3   . folic acid (FOLVITE) 1 MG tablet      TAKE 1 TABLET (1 MG TOTAL) BY MOUTH DAILY.   30 tablet   3   . lisinopril (PRINIVIL,ZESTRIL) 2.5 MG tablet      TAKE 1 TABLET (2.5 MG TOTAL) BY MOUTH DAILY.   30 tablet   3   . lisinopril (PRINIVIL,ZESTRIL) 2.5 MG tablet      TAKE 1 TABLET (2.5 MG TOTAL) BY MOUTH DAILY.   30 tablet   3   . metoprolol tartrate (LOPRESSOR) 25 MG tablet   Oral   Take 0.5 tablets (12.5 mg total) by mouth 2 (two) times daily.   60 tablet   3   . Multiple Vitamin (MULTIVITAMIN) tablet   Oral   Take 1 tablet by mouth daily.         Marland Kitchen omeprazole (PRILOSEC) 20 MG capsule   Oral   Take 1 capsule (20 mg total) by mouth 2 (two) times daily.   60 capsule   5    BP 158/70  Pulse 63  Temp(Src) 98 F (36.7 C) (Oral)  Resp 16  Ht 5\' 9"  (1.753 m)  Wt 135 lb (61.236 kg)  BMI 19.93 kg/m2 Physical Exam  Nursing note and vitals reviewed. Constitutional: She is oriented to person, place, and time. She appears well-developed and well-nourished.  HENT:  Head: Normocephalic and atraumatic.  Eyes: EOM are normal. Pupils are equal, round, and reactive to light.   Neck: Normal range of motion. Neck supple.  Cardiovascular: Normal rate, normal heart sounds and intact distal pulses.   Pulmonary/Chest: Effort normal and breath sounds normal.  Abdominal: Bowel sounds are normal. She exhibits no distension. There is no tenderness.  Musculoskeletal: Normal range of motion. She exhibits tenderness (tenderness over the R iliac crest, no hip tenderness; mild pain with ROM but able to fully range hip and no deformity). She exhibits no edema.  Neurological: She is alert and oriented to person, place, and time. She has normal strength. No cranial nerve deficit or sensory deficit.  Skin: Skin is warm and dry. No rash noted.  Psychiatric: She has a normal mood and affect.    ED Course  Procedures (including critical care time) Labs Reviewed - No data to display Dg Hip Complete Right  11/02/2012   *RADIOLOGY REPORT*  Clinical Data: Right hip pain since falling today.  History of hip surgery 5 years ago.  RIGHT HIP - COMPLETE 2+ VIEW  Comparison: None.  Findings: The bones are demineralized.  Patient is status post right total hip arthroplasty.  No hardware loosening is identified. There is post-traumatic deformity of the right lesser trochanter and both inferior pubic rami.  No acute fractures are identified. The sacroiliac joints appear intact.  There are degenerative changes within the lower lumbar spine.  An IVC filter is noted.  IMPRESSION:  1.  No evidence of acute fracture or dislocation status post right total hip arthroplasty. 2.  Deformities of the pubic rami and right lesser trochanter noted.  If the patient has persistent unexplained pain or inability to bear weight, follow-up or additional imaging may be warranted as acute fractures can be radiographically occult in the elderly.   Original Report Authenticated By: Carey Bullocks, M.D.   1. Contusion of right hip, initial encounter     MDM  Xray images reviewed. Low suspicion for occult fracture. Pt is  comfortable going home and resting there. Has Tramadol which has controlled her pain will in the past. Advised to return to the ED for any worsening pain, inability to get around safely or for any other concerns. Family members at bedside will stay with her to ensure her safety.   Charles B. Bernette Mayers, MD 11/02/12 [phone]

## 2012-11-02 NOTE — ED Notes (Signed)
Pt c/o fall from standing injuring right hip

## 2012-11-02 NOTE — Telephone Encounter (Signed)
Jacqueline [name] with [facility] is calling to let Dr. Beverely Low know that the patient fell today. Her bones are cracking and popping when she tries to get into a sitting position. Jacqueline [name] states that she has advised the patient go to the ER for x-rays.  No call back requested.

## 2012-11-04 ENCOUNTER — Telehealth: Payer: Self-pay | Admitting: *Deleted

## 2012-11-04 ENCOUNTER — Other Ambulatory Visit: Payer: Self-pay | Admitting: Physician Assistant

## 2012-11-04 MED ORDER — TRAMADOL HCL 50 MG PO TABS: 50.0000 mg | ORAL_TABLET | ORAL | Status: DC | PRN

## 2012-11-04 NOTE — Telephone Encounter (Signed)
Pt called stated she was seen in the ER on yest for a fell.   Pt was told she could take Tramadol 50mg  for pain which she already at home.  Pt states she only has 5 pill left and would like to have a refill.  Informed the pt I spoke with Dr. Beverely Low and she okayed the refill on the Tramadol.   Pt requested the refill be sent to [facility].   Rx printed and faxed to the pharmacy.//AB/CMA

## 2012-11-07 ENCOUNTER — Encounter (HOSPITAL_BASED_OUTPATIENT_CLINIC_OR_DEPARTMENT_OTHER): Payer: Self-pay

## 2012-11-07 ENCOUNTER — Emergency Department (HOSPITAL_BASED_OUTPATIENT_CLINIC_OR_DEPARTMENT_OTHER): Payer: Medicare HMO

## 2012-11-07 ENCOUNTER — Emergency Department (HOSPITAL_BASED_OUTPATIENT_CLINIC_OR_DEPARTMENT_OTHER)
Admission: EM | Admit: 2012-11-07 | Discharge: 2012-11-07 | Disposition: A | Discharge status: Home / Self Care | Payer: Medicare HMO | Attending: Emergency Medicine | Admitting: Emergency Medicine

## 2012-11-07 DIAGNOSIS — Z87891 Personal history of nicotine dependence: Secondary | ICD-10-CM | POA: Insufficient documentation

## 2012-11-07 DIAGNOSIS — M545 Low back pain, unspecified: Secondary | ICD-10-CM | POA: Insufficient documentation

## 2012-11-07 DIAGNOSIS — N390 Urinary tract infection, site not specified: Secondary | ICD-10-CM | POA: Insufficient documentation

## 2012-11-07 DIAGNOSIS — R112 Nausea with vomiting, unspecified: Secondary | ICD-10-CM | POA: Insufficient documentation

## 2012-11-07 DIAGNOSIS — Z8719 Personal history of other diseases of the digestive system: Secondary | ICD-10-CM | POA: Insufficient documentation

## 2012-11-07 DIAGNOSIS — Z951 Presence of aortocoronary bypass graft: Secondary | ICD-10-CM | POA: Insufficient documentation

## 2012-11-07 DIAGNOSIS — K5900 Constipation, unspecified: Secondary | ICD-10-CM | POA: Insufficient documentation

## 2012-11-07 DIAGNOSIS — I2510 Atherosclerotic heart disease of native coronary artery without angina pectoris: Secondary | ICD-10-CM | POA: Insufficient documentation

## 2012-11-07 DIAGNOSIS — Z79899 Other long term (current) drug therapy: Secondary | ICD-10-CM | POA: Insufficient documentation

## 2012-11-07 DIAGNOSIS — Z7982 Long term (current) use of aspirin: Secondary | ICD-10-CM | POA: Insufficient documentation

## 2012-11-07 DIAGNOSIS — I10 Essential (primary) hypertension: Secondary | ICD-10-CM | POA: Insufficient documentation

## 2012-11-07 DIAGNOSIS — R109 Unspecified abdominal pain: Secondary | ICD-10-CM

## 2012-11-07 DIAGNOSIS — Z8739 Personal history of other diseases of the musculoskeletal system and connective tissue: Secondary | ICD-10-CM | POA: Insufficient documentation

## 2012-11-07 LAB — CBC WITH DIFFERENTIAL/PLATELET
Basophils Absolute: 0 10*3/uL (ref 0.0–0.1)
Eosinophils Absolute: 0.4 10*3/uL (ref 0.0–0.7)
Lymphs Abs: 0.7 10*3/uL (ref 0.7–4.0)
MCH: 33.2 pg (ref 26.0–34.0)
MCV: 101.6 fL — ABNORMAL HIGH (ref 78.0–100.0)
Monocytes Absolute: 0.5 10*3/uL (ref 0.1–1.0)
Neutrophils Relative %: 79 % — ABNORMAL HIGH (ref 43–77)
Platelets: ADEQUATE 10*3/uL (ref 150–400)
RDW: 15 % (ref 11.5–15.5)
Smear Review: ADEQUATE

## 2012-11-07 LAB — COMPREHENSIVE METABOLIC PANEL
AST: 36 U/L (ref 0–37)
Albumin: 3.4 g/dL — ABNORMAL LOW (ref 3.5–5.2)
Alkaline Phosphatase: 78 U/L (ref 39–117)
BUN: 15 mg/dL (ref 6–23)
CO2: 25 mEq/L (ref 19–32)
Chloride: 96 mEq/L (ref 96–112)
Creatinine, Ser: 0.7 mg/dL (ref 0.50–1.10)
GFR calc non Af Amer: 79 mL/min — ABNORMAL LOW (ref >90)
Potassium: 3.6 mEq/L (ref 3.5–5.1)
Total Bilirubin: 0.6 mg/dL (ref 0.3–1.2)

## 2012-11-07 LAB — URINALYSIS, ROUTINE W REFLEX MICROSCOPIC
Bilirubin Urine: NEGATIVE
Glucose, UA: NEGATIVE mg/dL
Ketones, ur: 15 mg/dL — AB
Nitrite: POSITIVE — AB
Specific Gravity, Urine: 1.011 (ref 1.005–1.030)
pH: 6 (ref 5.0–8.0)

## 2012-11-07 LAB — URINE MICROSCOPIC-ADD ON

## 2012-11-07 MED ORDER — CEPHALEXIN 500 MG PO CAPS: 500.0000 mg | ORAL_CAPSULE | Freq: Four times a day (QID) | ORAL | Status: DC

## 2012-11-07 MED ORDER — ONDANSETRON HCL 4 MG PO TABS: 4.0000 mg | ORAL_TABLET | Freq: Four times a day (QID) | ORAL | Status: DC

## 2012-11-07 MED ORDER — POLYETHYLENE GLYCOL 3350 17 GM/SCOOP PO POWD: 17.0000 g | Freq: Every day | ORAL | Status: DC

## 2012-11-07 MED ORDER — SODIUM CHLORIDE 0.9 % IV SOLN
Freq: Once | INTRAVENOUS | Status: AC
  Administered 2012-11-07: 14:00:00 via INTRAVENOUS

## 2012-11-07 MED ORDER — MORPHINE SULFATE 2 MG/ML IJ SOLN
2.0000 mg | Freq: Once | INTRAMUSCULAR | Status: AC
  Administered 2012-11-07: 2 mg via INTRAVENOUS
  Filled 2012-11-07: qty 1

## 2012-11-07 MED ORDER — DEXTROSE 5 % IV SOLN
1.0000 g | Freq: Once | INTRAVENOUS | Status: AC
  Administered 2012-11-07: 1 g via INTRAVENOUS
  Filled 2012-11-07: qty 10

## 2012-11-07 MED ORDER — IOHEXOL 300 MG/ML  SOLN: 50.0000 mL | Freq: Once | INTRAMUSCULAR | Status: DC | PRN

## 2012-11-07 MED ORDER — IOHEXOL 300 MG/ML  SOLN
100.0000 mL | Freq: Once | INTRAMUSCULAR | Status: AC | PRN
  Administered 2012-11-07: 100 mL via INTRAVENOUS

## 2012-11-07 MED ORDER — ONDANSETRON HCL 4 MG/2ML IJ SOLN
4.0000 mg | Freq: Once | INTRAMUSCULAR | Status: AC
  Administered 2012-11-07: 4 mg via INTRAVENOUS
  Filled 2012-11-07: qty 2

## 2012-11-07 NOTE — ED Provider Notes (Signed)
5:35 PM d/w patient and family member at the bedside- pt has evidence of UTI- started on rocephin.  She has had no further vomiting in the ED, tolerated fluid in the ED without difficulty.  I have discussed recs for pancreatic CT/MRI with both radiologist who states this is nonemergent and with patient/family who will arrange for f/u with PMD.  Discharged with strict return precautions.  Pt agreeable with plan.  Ethelda Chick, MD 11/07/12 [phone]

## 2012-11-07 NOTE — ED Notes (Signed)
Pt returned from CT °

## 2012-11-07 NOTE — ED Notes (Signed)
Pt reports abdominal pain, abdominal distention, constipation and vomited x 1 today.  LBM 1 week ago.

## 2012-11-07 NOTE — ED Provider Notes (Signed)
CSN: [id number]     Arrival date & time 11/07/12  1253 History     First MD Initiated Contact with Patient 11/07/12 1300     Chief Complaint  Patient presents with  . Abdominal Pain  . Emesis  . Back Pain   (Consider location/radiation/quality/duration/timing/severity/associated sxs/prior Treatment) HPI Complains of low back pain and abdominal pain onset approximately one week ago. Patient states he vomited 5 or 6 times today. Last bowel movement approximately one week ago. She does continue to pass gas per rectum. Feels constipated. No fever. No treatment prior to coming here. Back pain is worse with movement . Improved with remaining still,. Denies nausea at present. No other associated symptoms. No treatment prior to coming here. Past Medical History  Diagnosis Date  . Diverticulitis   . Hypertension   . Osteoporosis   . CAD (coronary artery disease)    Past Surgical History  Procedure Laterality Date  . Fracture surgery    . Partial colectomy    . Coronary artery bypass graft N/A 08/25/2012    Procedure: CORONARY ARTERY BYPASS GRAFTING ;  Surgeon: Alleen Borne, MD;  Location: [facility] OR;  Service: Open Heart Surgery;  Laterality: N/A;  four bypasses total    Family History  Problem Relation Age of Onset  . Hypertension Other   . Cancer Mother     stomach  . Diabetes Neg Hx   . Heart disease Neg Hx   . Stroke Neg Hx    History  Substance Use Topics  . Smoking status: Former Smoker    Types: Cigarettes  . Smokeless tobacco: Never Used  . Alcohol Use: Yes     Comment: Occasional   OB History   Grav Para Term Preterm Abortions TAB SAB Ect Mult Living                 Review of Systems  Constitutional: Negative.   HENT: Negative.   Respiratory: Negative.   Cardiovascular: Negative.   Gastrointestinal: Positive for vomiting, abdominal pain and constipation.  Musculoskeletal: Positive for back pain.  Skin: Negative.   Neurological: Negative.    Psychiatric/Behavioral: Negative.   All other systems reviewed and are negative.    Allergies  Review of patient's allergies indicates no known allergies.  Home Medications   Current Outpatient Rx  Name  Route  Sig  Dispense  Refill  . alendronate (FOSAMAX) 70 MG tablet      TAKE 1 TABLET BY MOUTH EVERY 7 DAYS. TAKE WITH A FULL GLASS OF WATER ON AN EMPTY STOMACH   4 tablet   3   . amiodarone (PACERONE) 200 MG tablet   Oral   Take 1 tablet (200 mg total) by mouth daily.   30 tablet   3   . amLODipine (NORVASC) 10 MG tablet      TAKE 1 TABLET (10 MG TOTAL) BY MOUTH DAILY.   30 tablet   3   . aspirin EC 325 MG tablet   Oral   Take 325 mg by mouth every evening.          Marland Kitchen atorvastatin (LIPITOR) 40 MG tablet   Oral   Take 1 tablet (40 mg total) by mouth daily at 6 PM.   30 tablet   3   . folic acid (FOLVITE) 1 MG tablet      TAKE 1 TABLET (1 MG TOTAL) BY MOUTH DAILY.   30 tablet   3   . lisinopril (PRINIVIL,ZESTRIL) 2.5 MG  tablet      TAKE 1 TABLET (2.5 MG TOTAL) BY MOUTH DAILY.   30 tablet   3   . lisinopril (PRINIVIL,ZESTRIL) 2.5 MG tablet      TAKE 1 TABLET (2.5 MG TOTAL) BY MOUTH DAILY.   30 tablet   3   . metoprolol tartrate (LOPRESSOR) 25 MG tablet   Oral   Take 0.5 tablets (12.5 mg total) by mouth 2 (two) times daily.   60 tablet   3   . Multiple Vitamin (MULTIVITAMIN) tablet   Oral   Take 1 tablet by mouth daily.         Marland Kitchen omeprazole (PRILOSEC) 20 MG capsule   Oral   Take 1 capsule (20 mg total) by mouth 2 (two) times daily.   60 capsule   5   . traMADol (ULTRAM) 50 MG tablet   Oral   Take 1-2 tablets (50-100 mg total) by mouth every 4 (four) hours as needed.   30 tablet   0    BP 189/91  Pulse 67  Temp(Src) 97.6 F (36.4 C) (Oral)  Resp 18  Ht 5\' 6"  (1.676 m)  Wt 130 lb (58.968 kg)  BMI 20.99 kg/m2  SpO2 97% Physical Exam  Nursing note and vitals reviewed. Constitutional:  Chronically ill-appearing  HENT:  Head:  Normocephalic and atraumatic.  Eyes: Conjunctivae are normal. Pupils are equal, round, and reactive to light.  Neck: Neck supple. No tracheal deviation present. No thyromegaly present.  Cardiovascular: Normal rate and regular rhythm.   No murmur heard. Pulmonary/Chest: Effort normal and breath sounds normal.  Abdominal: Soft. She exhibits no distension. There is tenderness.  Tender at left lower quadrant  Genitourinary:  Normal tone brown stool. No gross blood  Musculoskeletal: Normal range of motion. She exhibits no edema and no tenderness.  Neurological: She is alert. Coordination normal.  Skin: Skin is warm and dry. No rash noted.  Psychiatric: She has a normal mood and affect.    ED Course   Procedures (including critical care time)  Labs Reviewed  URINALYSIS, ROUTINE W REFLEX MICROSCOPIC  COMPREHENSIVE METABOLIC PANEL  CBC WITH DIFFERENTIAL  LIPASE, BLOOD  OCCULT BLOOD X 1 CARD TO LAB, STOOL   No results found. No diagnosis found. Results for orders placed during the hospital encounter of 11/07/12  URINALYSIS, ROUTINE W REFLEX MICROSCOPIC      Result Value Range   Color, Urine YELLOW  YELLOW   APPearance CLOUDY (*) CLEAR   Specific Gravity, Urine 1.011  1.005 - 1.030   pH 6.0  5.0 - 8.0   Glucose, UA NEGATIVE  NEGATIVE mg/dL   Hgb urine dipstick TRACE (*) NEGATIVE   Bilirubin Urine NEGATIVE  NEGATIVE   Ketones, ur 15 (*) NEGATIVE mg/dL   Protein, ur NEGATIVE  NEGATIVE mg/dL   Urobilinogen, UA 1.0  0.0 - 1.0 mg/dL   Nitrite POSITIVE (*) NEGATIVE   Leukocytes, UA MODERATE (*) NEGATIVE  COMPREHENSIVE METABOLIC PANEL      Result Value Range   Sodium 134 (*) 135 - 145 mEq/L   Potassium 3.6  3.5 - 5.1 mEq/L   Chloride 96  96 - 112 mEq/L   CO2 25  19 - 32 mEq/L   Glucose, Bld 121 (*) 70 - 99 mg/dL   BUN 15  6 - 23 mg/dL   Creatinine, Ser 9.60  0.50 - 1.10 mg/dL   Calcium 9.9  8.4 - 45.4 mg/dL   Total Protein 7.0  6.0 - 8.3  g/dL   Albumin 3.4 (*) 3.5 - 5.2 g/dL    AST 36  0 - 37 U/L   ALT 37 (*) 0 - 35 U/L   Alkaline Phosphatase 78  39 - 117 U/L   Total Bilirubin 0.6  0.3 - 1.2 mg/dL   GFR calc non Af Amer 79 (*) >90 mL/min   GFR calc Af Amer >90  >90 mL/min  CBC WITH DIFFERENTIAL      Result Value Range   WBC 7.7  4.0 - 10.5 K/uL   RBC 3.85 (*) 3.87 - 5.11 MIL/uL   Hemoglobin 12.8  12.0 - 15.0 g/dL   HCT 16.1  09.6 - 04.5 %   MCV 101.6 (*) 78.0 - 100.0 fL   MCH 33.2  26.0 - 34.0 pg   MCHC 32.7  30.0 - 36.0 g/dL   RDW 40.9  81.1 - 91.4 %   Platelets    150 - 400 K/uL   Value: PLATELET CLUMPS NOTED ON SMEAR, COUNT APPEARS ADEQUATE   Neutrophils Relative % 79 (*) 43 - 77 %   Lymphocytes Relative 9 (*) 12 - 46 %   Monocytes Relative 7  3 - 12 %   Eosinophils Relative 5  0 - 5 %   Basophils Relative 0  0 - 1 %   Neutro Abs 6.1  1.7 - 7.7 K/uL   Lymphs Abs 0.7  0.7 - 4.0 K/uL   Monocytes Absolute 0.5  0.1 - 1.0 K/uL   Eosinophils Absolute 0.4  0.0 - 0.7 K/uL   Basophils Absolute 0.0  0.0 - 0.1 K/uL   WBC Morphology WHITE COUNT CONFIRMED ON SMEAR     Smear Review       Value: PLATELET CLUMPS NOTED ON SMEAR, COUNT APPEARS ADEQUATE  LIPASE, BLOOD      Result Value Range   Lipase 38  11 - 59 U/L  OCCULT BLOOD X 1 CARD TO LAB, STOOL      Result Value Range   Fecal Occult Bld NEGATIVE  NEGATIVE  URINE MICROSCOPIC-ADD ON      Result Value Range   Squamous Epithelial / LPF RARE  RARE   WBC, UA 11-20  <3 WBC/hpf   RBC / HPF 0-2  <3 RBC/hpf   Bacteria, UA MANY (*) RARE   Dg Hip Complete Right  11/02/2012   *RADIOLOGY REPORT*  Clinical Data: Right hip pain since falling today.  History of hip surgery 5 years ago.  RIGHT HIP - COMPLETE 2+ VIEW  Comparison: None.  Findings: The bones are demineralized.  Patient is status post right total hip arthroplasty.  No hardware loosening is identified. There is post-traumatic deformity of the right lesser trochanter and both inferior pubic rami.  No acute fractures are identified. The sacroiliac joints appear  intact.  There are degenerative changes within the lower lumbar spine.  An IVC filter is noted.  IMPRESSION:  1.  No evidence of acute fracture or dislocation status post right total hip arthroplasty. 2.  Deformities of the pubic rami and right lesser trochanter noted.  If the patient has persistent unexplained pain or inability to bear weight, follow-up or additional imaging may be warranted as acute fractures can be radiographically occult in the elderly.   Original Report Authenticated By: Carey Bullocks, M.D.    MDM  Pt signed out to Dr. Karma Ganja 3 pm  Doug Sou, MD 11/07/12 1500

## 2012-11-08 NOTE — Telephone Encounter (Signed)
Called and spoke with Gaylyn Rong w/ [facility] and asked her how she thinks the pt is doing?  Gaylyn Rong stated that the day she was to discharge her the pt fell, so she wants to know if Dr. Beverely Low want to extend her home health visits.  Informed Gaylyn Rong that Dr. Beverely Low wanted to know if she feels the pt would benefit from more visits?  Gaylyn Rong stated that she does feel the pt would benefit for more visits.  Informed Gaylyn Rong that Dr. Beverely Low is giving verbal orders to extend visits.//AB/CMA

## 2012-11-08 NOTE — Telephone Encounter (Signed)
Jacqueline [name] wants to know if Dr. Beverely Low wants her to discharge the patient or follow-up with more visits. If so for how long/ frequency?

## 2012-11-10 LAB — URINE CULTURE

## 2012-11-14 ENCOUNTER — Telehealth: Payer: Self-pay | Admitting: *Deleted

## 2012-11-14 ENCOUNTER — Ambulatory Visit: Payer: Medicare HMO | Admitting: Family Medicine

## 2012-11-14 NOTE — Telephone Encounter (Signed)
Spoke with daughter and advised of Dr. Rennis Golden rec of fleets enema. Patient or dtr not comfortable with that, advised to give pt some warm prune juice to see it that helps with bm. Pt stated she has a small bm since our conversation this morning.. Is feeling some relief. Advised dtr to call and cancel appt if she felt better.

## 2012-11-14 NOTE — Telephone Encounter (Signed)
Should do fleets enema at home if no BM w/ Miralax twice daily

## 2012-11-14 NOTE — Telephone Encounter (Signed)
Spoke with pt who complains of constipation. No BM since "last week." Patient was evaluated in the ED with a CT of the abdomen done due to same complaints she has today. Patient states she takes miralax but it has not helped. Do you want to try something different for constipation for her? Has appt today at 1:00 today and I discussed with her that this appt must be limited to 1 acute complaint as she had several.

## 2012-11-21 ENCOUNTER — Encounter: Payer: Self-pay | Admitting: Family Medicine

## 2012-11-21 ENCOUNTER — Ambulatory Visit (INDEPENDENT_AMBULATORY_CARE_PROVIDER_SITE_OTHER): Payer: Medicare HMO | Admitting: Family Medicine

## 2012-11-21 VITALS — BP 122/90 | HR 59 | Temp 97.6°F | Ht 65.5 in | Wt 116.0 lb

## 2012-11-21 DIAGNOSIS — R410 Disorientation, unspecified: Secondary | ICD-10-CM

## 2012-11-21 DIAGNOSIS — K5901 Slow transit constipation: Secondary | ICD-10-CM

## 2012-11-21 DIAGNOSIS — K869 Disease of pancreas, unspecified: Secondary | ICD-10-CM | POA: Insufficient documentation

## 2012-11-21 DIAGNOSIS — F29 Unspecified psychosis not due to a substance or known physiological condition: Secondary | ICD-10-CM

## 2012-11-21 LAB — HEPATIC FUNCTION PANEL
ALT: 26 U/L (ref 0–35)
AST: 28 U/L (ref 0–37)
Bilirubin, Direct: 0.1 mg/dL (ref 0.0–0.3)
Total Bilirubin: 0.5 mg/dL (ref 0.3–1.2)

## 2012-11-21 LAB — BASIC METABOLIC PANEL
Chloride: 102 mEq/L (ref 96–112)
Creatinine, Ser: 0.8 mg/dL (ref 0.4–1.2)
Potassium: 4 mEq/L (ref 3.5–5.1)

## 2012-11-21 LAB — CBC WITH DIFFERENTIAL/PLATELET
Basophils Absolute: 0 10*3/uL (ref 0.0–0.1)
Eosinophils Absolute: 0 10*3/uL (ref 0.0–0.7)
Lymphocytes Relative: 13.5 % (ref 12.0–46.0)
MCHC: 32.5 g/dL (ref 30.0–36.0)
Neutrophils Relative %: 80.7 % — ABNORMAL HIGH (ref 43.0–77.0)
Platelets: 389 10*3/uL (ref 150.0–400.0)
RDW: 16.3 % — ABNORMAL HIGH (ref 11.5–14.6)

## 2012-11-21 LAB — AMYLASE: Amylase: 60 U/L (ref 27–131)

## 2012-11-21 LAB — B12 AND FOLATE PANEL: Vitamin B-12: 494 pg/mL (ref 211–911)

## 2012-11-21 LAB — LIPASE: Lipase: 44 U/L (ref 11.0–59.0)

## 2012-11-21 NOTE — Progress Notes (Signed)
  Subjective:    Patient ID: Jacqueline [name], female    DOB: 10/14/1928, 79 y.o.   MRN: [id number]  HPI Pancreatic lesion- seen on CT July.  Was recommended to have f/u imaging.  No epigastric pain, no jaundice, no N/V.  Constipation- pt was found to have large stool burden on CT scan.  Was taking Miralax daily w/out good success.  Started prune juice w/ good results.  Last night had 'diarrhea w/ some large pieces'.  Confusion- family reports they are very concerned b/c of frequent confusion, difficulty w/ decision making.  Family is now worried to leave pt alone.  Daughter-in-law has been staying home w/ pt but needs to return to work.  There were some memory deficits prior to heart surgery but much more notable after surgery.  Having difficulty writing.  Pt wants to drive, family has said no.   Review of Systems For ROS see HPI     Objective:   Physical Exam  Vitals reviewed. Constitutional: She appears well-developed and well-nourished. No distress.  HENT:  Head: Normocephalic and atraumatic.  Eyes: Conjunctivae and EOM are normal. Pupils are equal, round, and reactive to light.  Neck: Normal range of motion. Neck supple. No thyromegaly present.  Cardiovascular: Normal rate, regular rhythm and intact distal pulses.   Pulmonary/Chest: Effort normal and breath sounds normal. No respiratory distress. She has no wheezes. She has no rales.  Abdominal: Soft. Bowel sounds are normal. She exhibits distension. She exhibits no mass. There is tenderness (mild tenderness due to distension). There is no rebound and no guarding.  Musculoskeletal: She exhibits edema (trace bilateral LE edema).  Lymphadenopathy:    She has no cervical adenopathy.  Neurological: She is alert. No cranial nerve deficit.          Assessment & Plan:

## 2012-11-21 NOTE — Patient Instructions (Addendum)
We'll notify you of your lab results and make any changes if needed Start daily stool softeners, continue the Miralax, continue the prune juice daily We'll call you with your GI and neuro appts If you have worsening pain, please go to the ER Call with any questions or concerns- we'll call you on 8/18-19! Hang in there!

## 2012-11-22 ENCOUNTER — Telehealth: Payer: Self-pay

## 2012-11-22 NOTE — Assessment & Plan Note (Signed)
New to provider, ongoing issue for pt.  Evidence of large stool burden on CT.  Encouraged daily use of Miralax in addition to prune juice prn.  Will refer to GI and follow along.

## 2012-11-22 NOTE — Telephone Encounter (Signed)
Radiology called concerning the patients MRI. Questioned if there was any reason that the patient could not have contrast as this procedure is usually with contrast. CB# [phone]-Linda Appt is this Saturday 11/26/2012 Please advise.

## 2012-11-22 NOTE — Assessment & Plan Note (Signed)
New.  Seen on CT scan.  Recommended MRI f/u.  Get MRI and refer to GI.  Check labs to assess LFTs, amylase/lipase.  Will follow closely.

## 2012-11-22 NOTE — Assessment & Plan Note (Signed)
New.  Pt has declined dramatically since having anesthesia in May.  Given that she has had a 5 vessel bypass there is a good chance for vascular dementia vs Alzheimers.  Refer to neuro for complete evaluation.  No driving at this time.  Pt and family are aware.

## 2012-11-23 ENCOUNTER — Other Ambulatory Visit: Payer: Self-pay | Admitting: Family Medicine

## 2012-11-23 ENCOUNTER — Telehealth: Payer: Self-pay | Admitting: Family Medicine

## 2012-11-23 ENCOUNTER — Other Ambulatory Visit (HOSPITAL_COMMUNITY): Payer: Medicare HMO

## 2012-11-23 ENCOUNTER — Other Ambulatory Visit: Payer: Self-pay | Admitting: Internal Medicine

## 2012-11-23 DIAGNOSIS — R109 Unspecified abdominal pain: Secondary | ICD-10-CM

## 2012-11-23 DIAGNOSIS — R410 Disorientation, unspecified: Secondary | ICD-10-CM

## 2012-11-23 NOTE — Progress Notes (Signed)
Spoke with pt made aware that referrals have been placed and she would be contact within the next week with the appt date and times.

## 2012-11-23 NOTE — Progress Notes (Signed)
Orders placed for GI and Neuro referrals. Order for MRI also placed. Pt notified

## 2012-11-23 NOTE — Telephone Encounter (Signed)
Advised radiology that with contrast is fine.

## 2012-11-23 NOTE — Telephone Encounter (Signed)
Advised patient of labs and follow through with the upcoming appts and procedures. Patient will follow plan.

## 2012-11-23 NOTE — Telephone Encounter (Signed)
Patient is calling because she says she has done everything Dr. Beverely Low asked her to do at her appointment on Monday, 11/21/12, but her issue is still unresolved. Wants to know what else she can do. Please advise.

## 2012-11-23 NOTE — Telephone Encounter (Signed)
Message copied by Baldwin Jamaica on Wed Nov 23, 2012 12:35 PM ------      Message from: Sheliah Hatch      Created: Tue Nov 22, 2012  8:07 AM       No obvious cause of pt's confusion.  No metabolic abnormalities that would account for pt's abdominal pain/distention.  Will proceed w/ plan discussed at OV (Neuro and GI referral, MRI of abd) ------

## 2012-11-23 NOTE — Telephone Encounter (Signed)
Pt can have contrast- did not know which test to order.

## 2012-11-26 ENCOUNTER — Ambulatory Visit (HOSPITAL_BASED_OUTPATIENT_CLINIC_OR_DEPARTMENT_OTHER)
Admission: RE | Admit: 2012-11-26 | Discharge: 2012-11-26 | Disposition: A | Discharge status: Home / Self Care | Payer: Medicare HMO | Source: Ambulatory Visit | Attending: Family Medicine | Admitting: Family Medicine

## 2012-11-26 DIAGNOSIS — R109 Unspecified abdominal pain: Secondary | ICD-10-CM | POA: Insufficient documentation

## 2012-11-26 DIAGNOSIS — K869 Disease of pancreas, unspecified: Secondary | ICD-10-CM | POA: Insufficient documentation

## 2012-11-26 DIAGNOSIS — N281 Cyst of kidney, acquired: Secondary | ICD-10-CM | POA: Insufficient documentation

## 2012-11-26 DIAGNOSIS — K5710 Diverticulosis of small intestine without perforation or abscess without bleeding: Secondary | ICD-10-CM | POA: Insufficient documentation

## 2012-11-26 DIAGNOSIS — R1110 Vomiting, unspecified: Secondary | ICD-10-CM | POA: Insufficient documentation

## 2012-11-26 MED ORDER — GADOBENATE DIMEGLUMINE 529 MG/ML IV SOLN
10.0000 mL | Freq: Once | INTRAVENOUS | Status: AC | PRN
Start: 2012-11-26 — End: 2012-11-26
  Administered 2012-11-26: 10 mL via INTRAVENOUS

## 2012-11-28 ENCOUNTER — Other Ambulatory Visit: Payer: Self-pay | Admitting: Family Medicine

## 2012-11-28 DIAGNOSIS — K869 Disease of pancreas, unspecified: Secondary | ICD-10-CM

## 2012-11-28 DIAGNOSIS — R109 Unspecified abdominal pain: Secondary | ICD-10-CM

## 2012-12-02 ENCOUNTER — Encounter: Payer: Self-pay | Admitting: Gastroenterology

## 2012-12-16 ENCOUNTER — Ambulatory Visit (INDEPENDENT_AMBULATORY_CARE_PROVIDER_SITE_OTHER): Payer: Medicare HMO | Admitting: Gastroenterology

## 2012-12-16 ENCOUNTER — Telehealth: Payer: Self-pay

## 2012-12-16 ENCOUNTER — Encounter: Payer: Self-pay | Admitting: Gastroenterology

## 2012-12-16 VITALS — BP 146/84 | HR 64 | Ht 65.5 in | Wt 116.4 lb

## 2012-12-16 DIAGNOSIS — K5900 Constipation, unspecified: Secondary | ICD-10-CM

## 2012-12-16 DIAGNOSIS — K219 Gastro-esophageal reflux disease without esophagitis: Secondary | ICD-10-CM

## 2012-12-16 DIAGNOSIS — R933 Abnormal findings on diagnostic imaging of other parts of digestive tract: Secondary | ICD-10-CM

## 2012-12-16 DIAGNOSIS — Z85038 Personal history of other malignant neoplasm of large intestine: Secondary | ICD-10-CM

## 2012-12-16 DIAGNOSIS — Q453 Other congenital malformations of pancreas and pancreatic duct: Secondary | ICD-10-CM

## 2012-12-16 NOTE — Telephone Encounter (Signed)
Message copied by Jessee Avers on Fri Dec 16, 2012  3:57 PM ------      Message from: Claudette Head T      Created: Fri Dec 16, 2012  1:21 PM       [name],             Please inform the patient and family of Dr. Christella Hartigan' recommendation and schedule pancreatic MRI for 1 year from her recent MRI. Thanks.            MS            ----- Message -----         From: Rachael Fee, MD         Sent: 12/16/2012   1:13 PM           To: Meryl Dare, MD            Judie Petit,      A newer AGA algorithm recommends follow up MRI in 1 year (for cysts < 3cm without any concerning features (dilated main PD or associated solid nodules).  I'm starting to adopt this more and more, certainly at age 81 it is a reasonable plan.            I'll get you a copy of the algorithm.            dj                        ----- Message -----         From: Meryl Dare, MD         Sent: 12/16/2012   1:07 PM           To: Rachael Fee, MD            Jesusita Oka, Would you please review my office note from today and her recent abdominal MRI showing a 1.7 cm pancreatic tail cystic lesion for consideration of EUS. Thanks. Judie Petit                         ------

## 2012-12-16 NOTE — Progress Notes (Signed)
History of Present Illness: This is an 79 year old female accompanied by her daughter and son in law. She has records from [place] from her prior colonoscopy and surgery for colon adenocarcinoma arising in a polyp with subsequent sigmoid colectomy and reoperation with ileostomy for anastomotic leak and pelvic abscess. Subsequently her ileostomy was reversed. She's had problems with chronic constipation and intermittent lower abdominal pain which has improved with more effective treatment for constipation. Recent imaging studies as outlined below show a 1.7 cm cystic lesion in the pancreatic tail. Denies weight loss, diarrhea, change in stool caliber, melena, hematochezia, nausea, vomiting, dysphagia, reflux symptoms, chest pain.  Abd/pelvic CT IMPRESSION: 1. Significant stool noted in the right colon and transverse  colon. Scattered colonic diverticula are noted. There is no  evidence of acute diverticulitis. No evidence of acute colitis.  Moderate stool and gas noted in the distal sigmoid colon and rectum.  2. In axial image 65 there is a apparent blind ending small bowel loop containing some air and fluid and post surgical sutures through. There is no evidence of small bowel obstruction. This is most likely postsurgical in nature in this patient with prior colostomy in the right lower quadrant.  3. Mild dilatation of the main pancreatic duct up to 4 mm. Low density lesion in the region of pancreatic tail measures 1.2 cm. Further evaluation with pancreatic lesion protocol CT or MRI is recommended.  4. Bilateral renal cysts. No hydronephrosis or hydroureter.  5. There is a duodenal diverticulum in the region of the  ampulla/pancreatic head region measures about 1.8 cm. No evidence of acute complication.  6. IVC filter in place.  7. Atherosclerotic vascular calcifications as described above.  8. Mild distended gallbladder without evidence of gallstones.  9. Limited evaluation of the pelvis due to  metallic artifacts from right hip prosthesis.  10. Mild distended urinary bladder. There is gas anterior aspect of urinary bladder probable post recent instrumentation. Clinical correlation is necessary.  11. Significant compression deformity of the T12 vertebral body of indeterminate age. No definite evidence of acute fracture. There is moderate spinal canal stenosis at this level. Clinical correlation is necessary.  Abd MRI IMPRESSION:  1.7 cm cystic lesion in the pancreatic tail, likely representing a benign cystic pancreatic neoplasm. Followup by MRI is recommended in 12 months to confirm stability. This recommendation follows ACR consensus guidelines: Managing Incidental Findings on Abdominal CT:  White Paper of the ACR Incidental Findings Committee. J Am Coll Radiol 2010;7:754-773.   Pancreas divisum, with mild irregularity/ beading of the main pancreatic duct suspicious for chronic pancreatitis. No signs of acute pancreatitis.  Review of Systems: Pertinent positive and negative review of systems were noted in the above HPI section. All other review of systems were otherwise negative.  Current Medications, Allergies, Past Medical History, Past Surgical History, Family History and Social History were reviewed in [employer] record.  Physical Exam: General: Well developed , well nourished, chronically ill-appearing, frail, uses a walker for right hip pain, no acute distress Head: Normocephalic and atraumatic Eyes:  sclerae anicteric, EOMI Ears: Normal auditory acuity Mouth: No deformity or lesions Neck: Supple, no masses or thyromegaly Lungs: Clear throughout to auscultation Heart: Regular rate and rhythm; no murmurs, rubs or bruits Abdomen: Soft, non tender and non distended. No masses, hepatosplenomegaly or hernias noted. Normal Bowel sounds Musculoskeletal: Symmetrical with no gross deformities  Skin: No lesions on visible extremities Pulses:  Normal pulses  noted Extremities: No clubbing, cyanosis, edema or deformities noted  Neurological: Alert oriented x 4, grossly nonfocal Cervical Nodes:  No significant cervical adenopathy Inguinal Nodes: No significant inguinal adenopathy Psychological:  Alert and cooperative. Normal mood and affect  Assessment and Recommendations:  1. Pancreatic tail 1.7 cm cystic lesion. Benign cyst versus cystic neoplasm. I will ask Dr. Christella Hartigan to review for consideration of endoscopic ultrasound. If EUS and is not feasible or advisable will plan for a 6 month MRI to reassess this lesion for interval change.  2. Pancreas divisum with pancreatic duct changes suggestive of chronic pancreatitis. This can be further evaluated at EUS.  3. Personal history of colon adenocarcinoma arising in a sigmoid colon polyp in 2007. Subsequent surgical resection complicated by anastomotic leak with revision required. She has not had a followup colonoscopy since that time. Given her age and comorbidities and the lack of colonic lesions noted on EEG and MRI will not plan for future surveillance colonoscopies.  4. Chronic constipation with intermittent abdominal pain. MiraLax once or twice daily on a regular basis long-term for management. High fiber diet. Adequate water intake. Followup with her PCP.   5. Chronic GERD. Well controlled on omeprazole 20 mg twice a day.

## 2012-12-16 NOTE — Patient Instructions (Addendum)
Someone from our office will contact your regarding your Endoscopic Ultrasound.   Please take your Miralax mixing 17 grams in 8 oz of water daily and also drink prune juice daily to help prevent constipation.  Thank you for choosing me and [facility].  Venita Lick. Pleas Koch., MD., Clementeen Graham

## 2012-12-16 NOTE — Telephone Encounter (Signed)
Spoke with patient and informed her of Dr. Christella Hartigan and Dr. Anselm Jungling recommendations. Told patient we will contact her in one year to schedule follow up MRI. Pt agreed and verbalized understanding.

## 2012-12-21 ENCOUNTER — Encounter: Payer: Self-pay | Admitting: Neurology

## 2012-12-21 ENCOUNTER — Ambulatory Visit (INDEPENDENT_AMBULATORY_CARE_PROVIDER_SITE_OTHER): Payer: Medicare HMO | Admitting: Neurology

## 2012-12-21 VITALS — BP 100/72 | HR 60 | Temp 97.8°F | Resp 20 | Wt 116.1 lb

## 2012-12-21 DIAGNOSIS — G3184 Mild cognitive impairment, so stated: Secondary | ICD-10-CM

## 2012-12-21 DIAGNOSIS — F411 Generalized anxiety disorder: Secondary | ICD-10-CM

## 2012-12-21 DIAGNOSIS — R4189 Other symptoms and signs involving cognitive functions and awareness: Secondary | ICD-10-CM

## 2012-12-21 MED ORDER — DONEPEZIL HCL 5 MG PO TABS: 5.0000 mg | ORAL_TABLET | Freq: Every day | ORAL | Status: DC

## 2012-12-21 NOTE — Progress Notes (Signed)
NEUROLOGY CONSULTATION NOTE  Jacqueline [name] MRN: [id number] DOB: [birth year]  Referring provider: Dr. Beverely Low Primary care provider: Dr. Beverely Low  Reason for consult:  Memory problems.  HISTORY OF PRESENT ILLNESS: Jacqueline [name] is an 79 year old woman with anxiety, hypertension and hearing loss who presents for evaluation of memory.  She is accompanied by her son and daughter-in-law.  Records and images were personally reviewed where available.   She underwent coronary artery bypass graft x 5 in May 2014.  She began to have symptoms prior, but has gotten much worse since the procedure.  Her family first started noticing symptoms about two years ago.  She was forgetting how to perform certain tasks, such as using the TV remote.  She often repeats questions.  She needs help administering her medications.  She also says that she has difficulty figuring out what she wants to say.  She has strong history of anxiety and insomnia.  She has taken Ambien in the past but it made her sick.  She will often fall asleep easily during the day but is easily awoken.  She is able to perform daily activities such as bathing and dressing.  She is able to cook and use the stove, but only able to use the stove when somebody else is in the house.  She no longer drives.  There is no noticeable difficulty recalling names or recognizing people.  She has not had any change in behavior or personality.  She is sometimes irritable, but not combative. No hallucinations or delusions.  She has gait issues since a right hip replacement several years ago.  She has a shuffling gait and sometimes trips over things.  She was diagnosed with a UTI about 6 weeks ago and was treated accordingly.  There are no abnormal movements noted.  No known family history of dementia.  She usually sits alone at home.  She avoids social situations due to anxiety.  She worries a lot, particularly with new situations.  She did not get any sleep  last night in anticipation for today's visit.  She feels a little sad at times, but not tearful.  11/21/12: TSH 1.74, B12 , folate , WBC 8.9, Hgb 13.6, Hct 41.8, PLT 389, MCV 101.7, Na 136, K 4, Cl 102, CO2 28, glucose 103, BUN 10, Cr 0.8, Ca 9.4, AP 134, AST 28, ALT 26, albumin 3.6, amylase 60, lipase 44.  PAST MEDICAL HISTORY: Past Medical History  Diagnosis Date  . Diverticulitis   . Hypertension   . Osteoporosis   . CAD (coronary artery disease)     PAST SURGICAL HISTORY: Past Surgical History  Procedure Laterality Date  . Fracture surgery    . Partial colectomy    . Coronary artery bypass graft N/A 08/25/2012    Procedure: CORONARY ARTERY BYPASS GRAFTING ;  Surgeon: Alleen Borne, MD;  Location: [facility] OR;  Service: Open Heart Surgery;  Laterality: N/A;  four bypasses total     MEDICATIONS: Current Outpatient Prescriptions on File Prior to Visit  Medication Sig Dispense Refill  . alendronate (FOSAMAX) 70 MG tablet TAKE 1 TABLET BY MOUTH EVERY 7 DAYS. TAKE WITH A FULL GLASS OF WATER ON AN EMPTY STOMACH  4 tablet  3  . amiodarone (PACERONE) 200 MG tablet Take 1 tablet (200 mg total) by mouth daily.  30 tablet  3  . amLODipine (NORVASC) 10 MG tablet TAKE 1 TABLET (10 MG TOTAL) BY MOUTH DAILY.  30 tablet  3  .  aspirin EC 325 MG tablet Take 325 mg by mouth every evening.       Marland Kitchen atorvastatin (LIPITOR) 40 MG tablet Take 1 tablet (40 mg total) by mouth daily at 6 PM.  30 tablet  3  . folic acid (FOLVITE) 1 MG tablet TAKE 1 TABLET (1 MG TOTAL) BY MOUTH DAILY.  30 tablet  3  . lisinopril (PRINIVIL,ZESTRIL) 2.5 MG tablet TAKE 1 TABLET (2.5 MG TOTAL) BY MOUTH DAILY.  30 tablet  3  . metoprolol tartrate (LOPRESSOR) 25 MG tablet Take 0.5 tablets (12.5 mg total) by mouth 2 (two) times daily.  60 tablet  3  . Multiple Vitamin (MULTIVITAMIN) tablet Take 1 tablet by mouth daily.      Marland Kitchen omeprazole (PRILOSEC) 20 MG capsule Take 1 capsule (20 mg total) by mouth 2 (two) times daily.  60 capsule  5  .  polyethylene glycol powder (MIRALAX) powder Take 17 g by mouth daily.  255 g  0   No current facility-administered medications on file prior to visit.    ALLERGIES: No Known Allergies  FAMILY HISTORY: Family History  Problem Relation Age of Onset  . Hypertension Other   . Cancer Mother     stomach  . Diabetes Neg Hx   . Heart disease Neg Hx   . Stroke Neg Hx     SOCIAL HISTORY: History   Social History  . Marital Status: Widowed    Spouse Name: N/A    Number of Children: 2  . Years of Education: 13+   Occupational History  . Retired    Social History Main Topics  . Smoking status: Former Smoker    Types: Cigarettes  . Smokeless tobacco: Never Used  . Alcohol Use: No     Comment: Occasional  . Drug Use: No  . Sexual Activity: No   Other Topics Concern  . Not on file   Social History Narrative   Regular exercise-no   Caffeine Use-yes    REVIEW OF SYSTEMS: Constitutional: No fevers, chills, or sweats, no generalized fatigue, change in appetite Eyes: No visual changes, double vision, eye pain Ear, nose and throat: No hearing loss, ear pain, nasal congestion, sore throat Cardiovascular: No chest pain, palpitations Respiratory:  No shortness of breath at rest or with exertion, wheezes GastrointestinaI: No nausea, vomiting, diarrhea, abdominal pain, fecal incontinence Genitourinary:  No dysuria, urinary retention or frequency Musculoskeletal:  No neck pain, back pain Integumentary: No rash, pruritus, skin lesions Neurological: as above Psychiatric: No depression, insomnia, anxiety Endocrine: No palpitations, fatigue, diaphoresis, mood swings, change in appetite, change in weight, increased thirst Hematologic/Lymphatic:  No anemia, purpura, petechiae. Allergic/Immunologic: no itchy/runny eyes, nasal congestion, recent allergic reactions, rashes  PHYSICAL EXAM: Filed Vitals:   12/21/12 0832  BP: 100/72  Pulse: 60  Temp: 97.8 F (36.6 C)  Resp: 20    General: No acute distress Head:  Normocephalic/atraumatic Neck: supple, no paraspinal tenderness, full range of motion Back: No paraspinal tenderness Heart: regular rate and rhythm Lungs: Clear to auscultation bilaterally. Vascular: No carotid bruits. Neurological Exam: Mental status: alert and oriented to person, place, and time, speech fluent and not dysarthric, able to name, repeat and follow commands.  Unable to recall all 5 words.  Difficulty with Trail Making test and copying a cube.  Misplaced some of the numbers on the clock and unable to correctly place the hands to requested time, difficulty with serial 7 subtraction, some difficulty with abstraction.  MOCA 18/30. Cranial nerves: CN  I: not tested CN II: pupils equal, round and reactive to light, visual fields intact, fundi unremarkable. CN III, IV, VI:  full range of motion, no nystagmus, no ptosis CN V: facial sensation intact CN VII: upper and lower face symmetric CN VIII: hearing intact CN IX, X: gag intact, uvula midline CN XI: sternocleidomastoid and trapezius muscles intact CN XII: tongue midline Bulk & Tone: normal, no fasciculations. Motor: 5/5 throughout, mild postural and kinetic tremor involving hands and head.  No rigidity, bradykinesia or resting tremor Sensation: reduced temperature and vibration in toes Deep Tendon Reflexes: 2+ throughout, except trace in left patellar and absent in ankles, toes down Finger to nose testing: with mild postural and kinetic tremor but no dysmetria Gait: upright postural with normal arm swing but shuffling gait (reduced stride and requires several steps to turn around). Romberg negative.  IMPRESSION & PLAN: Primarily amnestic mild cognitive impairment (although also involves visuospatial and executive functioning as well).  Given the slow progression over a couple of years, more likely a neurodegenerative process. 1.  Will start Aricept 5mg  daily with plan to increase to 10mg   daily in 4 weeks, if tolerated. 2.  MRI of brain 3.  Encouraged performing brain teasers, reading, socializing (such as at senior center) and going for regular walks 4.  Will need to address anxiety and insomnia but would not start another medication at this time. 5.  Provided info on Alzheimer's support groups. 6.  Follow up in 6 months or as needed. 45 minutes spent with patient, over 50% spent counseling and coordinating care.  Thank you for allowing me to take part in the care of this patient.  Shon Millet, DO  CC:  Rob Hickman, MD

## 2012-12-21 NOTE — Patient Instructions (Addendum)
1. We will start donepezil (Aricept) 5mg  daily for four weeks.  If you are tolerating the medication, then after four weeks, we will increase the dose to 10mg  daily.  Side effects include nausea, vomiting, diarrhea, vivid dreams, and muscle cramps.  Please call the clinic if you experience any of these symptoms.  2.  Brain teasers.  Don't sit around watching TV all day.  Stay active and go for walks.  3.  Will need to address anxiety.  4.  Will get MRI of brain.  5.  Follow up in 6 months.  Your MRI is scheduled at [facility] located at [address] (beside the hospital) on Thursday, September 18th at 12 noon.  Please arrive 15 minutes prior to your appointment.     [phone].

## 2012-12-22 ENCOUNTER — Encounter: Payer: Self-pay | Admitting: Family Medicine

## 2012-12-26 ENCOUNTER — Other Ambulatory Visit: Payer: Self-pay | Admitting: Internal Medicine

## 2012-12-26 ENCOUNTER — Other Ambulatory Visit: Payer: Self-pay | Admitting: Physician Assistant

## 2012-12-29 ENCOUNTER — Ambulatory Visit (HOSPITAL_COMMUNITY): Admission: RE | Admit: 2012-12-29 | Payer: Medicare HMO | Source: Ambulatory Visit

## 2013-01-02 ENCOUNTER — Encounter: Payer: Self-pay | Admitting: Cardiology

## 2013-01-02 ENCOUNTER — Ambulatory Visit (INDEPENDENT_AMBULATORY_CARE_PROVIDER_SITE_OTHER): Payer: Medicare HMO | Admitting: Cardiology

## 2013-01-02 VITALS — BP 130/79 | HR 54 | Ht 66.0 in | Wt 113.0 lb

## 2013-01-02 DIAGNOSIS — I359 Nonrheumatic aortic valve disorder, unspecified: Secondary | ICD-10-CM

## 2013-01-02 DIAGNOSIS — I2510 Atherosclerotic heart disease of native coronary artery without angina pectoris: Secondary | ICD-10-CM

## 2013-01-02 DIAGNOSIS — I350 Nonrheumatic aortic (valve) stenosis: Secondary | ICD-10-CM | POA: Insufficient documentation

## 2013-01-02 DIAGNOSIS — I10 Essential (primary) hypertension: Secondary | ICD-10-CM

## 2013-01-02 DIAGNOSIS — I4891 Unspecified atrial fibrillation: Secondary | ICD-10-CM

## 2013-01-02 DIAGNOSIS — I480 Paroxysmal atrial fibrillation: Secondary | ICD-10-CM | POA: Insufficient documentation

## 2013-01-02 NOTE — Assessment & Plan Note (Signed)
Blood pressure controlled.continue present medications. 

## 2013-01-02 NOTE — Progress Notes (Signed)
HPI: Fu CAD. Patient apparently on amiodarone in the past for paroxysmal atrial fibrillation. Also with SVT. Echocardiogram in May 2014 showed normal LV function, mild aortic stenosis with a mean gradient of 11 mm of mercury, and mild LAE. Carotid Dopplers in May of 2014 showed no significant stenosis. Cardiac cath in May of 2014 showed 3 vessel CAD. She subsequently underwent CABG x 5 per Dr. Laneta Simmers with LIMA to LAD, SVG to DX, SVG to 1st and 2nd OM, SVG to RCA. Since she was last seen, the patient has dyspnea with more extreme activities but not with routine activities. It is relieved with rest. It is not associated with chest pain. There is no orthopnea, PND or pedal edema. There is no syncope or palpitations. There is no exertional chest pain.    Current Outpatient Prescriptions  Medication Sig Dispense Refill  . alendronate (FOSAMAX) 70 MG tablet TAKE 1 TABLET BY MOUTH EVERY 7 DAYS. TAKE WITH A FULL GLASS OF WATER ON AN EMPTY STOMACH  4 tablet  3  . amiodarone (PACERONE) 200 MG tablet TAKE 1 TABLET (200 MG TOTAL) BY MOUTH DAILY.  30 tablet  3  . amLODipine (NORVASC) 10 MG tablet TAKE 1 TABLET (10 MG TOTAL) BY MOUTH DAILY.  30 tablet  3  . aspirin EC 325 MG tablet Take 325 mg by mouth every evening.       Marland Kitchen atorvastatin (LIPITOR) 40 MG tablet Take 1 tablet (40 mg total) by mouth daily at 6 PM.  30 tablet  3  . donepezil (ARICEPT) 5 MG tablet Take 1 tablet (5 mg total) by mouth at bedtime.  30 tablet  0  . donepezil (ARICEPT) 5 MG tablet Take 5 mg by mouth at bedtime as needed.      . folic acid (FOLVITE) 1 MG tablet TAKE 1 TABLET (1 MG TOTAL) BY MOUTH DAILY.  30 tablet  3  . lisinopril (PRINIVIL,ZESTRIL) 2.5 MG tablet TAKE 1 TABLET (2.5 MG TOTAL) BY MOUTH DAILY.  30 tablet  3  . metoprolol tartrate (LOPRESSOR) 25 MG tablet Take 0.5 tablets (12.5 mg total) by mouth 2 (two) times daily.  60 tablet  3  . Multiple Vitamin (MULTIVITAMIN) tablet Take 1 tablet by mouth daily.      Marland Kitchen omeprazole  (PRILOSEC) 20 MG capsule Take 1 capsule (20 mg total) by mouth 2 (two) times daily.  60 capsule  5  . polyethylene glycol powder (MIRALAX) powder Take 17 g by mouth daily.  255 g  0   No current facility-administered medications for this visit.     Past Medical History  Diagnosis Date  . Diverticulitis   . Hypertension   . Osteoporosis   . CAD (coronary artery disease)     Past Surgical History  Procedure Laterality Date  . Fracture surgery    . Partial colectomy    . Coronary artery bypass graft N/A 08/25/2012    Procedure: CORONARY ARTERY BYPASS GRAFTING ;  Surgeon: Alleen Borne, MD;  Location: [facility] OR;  Service: Open Heart Surgery;  Laterality: N/A;  four bypasses total     History   Social History  . Marital Status: Widowed    Spouse Name: N/A    Number of Children: 2  . Years of Education: 13+   Occupational History  . Retired    Social History Main Topics  . Smoking status: Former Smoker    Types: Cigarettes  . Smokeless tobacco: Never Used  . Alcohol Use: No  Comment: Occasional  . Drug Use: No  . Sexual Activity: No   Other Topics Concern  . Not on file   Social History Narrative   Regular exercise-no   Caffeine Use-yes    ROS: no fevers or chills, productive cough, hemoptysis, dysphasia, odynophagia, melena, hematochezia, dysuria, hematuria, rash, seizure activity, orthopnea, PND, pedal edema, claudication. Remaining systems are negative.  Physical Exam: Well-developed well-nourished in no acute distress.  Skin is warm and dry.  HEENT is normal.  Neck is supple.  Chest is clear to auscultation with normal expansion. Previous sternotomy Cardiovascular exam is regular rate and rhythm. 2/6 systolic murmur left sternal border. Abdominal exam nontender or distended. No masses palpated. Extremities show no edema. neuro grossly intact  ECG Sinus rhythm at a rate of 54. Left ventricular hypertrophy. Incomplete right bundle branch block.

## 2013-01-02 NOTE — Assessment & Plan Note (Signed)
Plan followup echocardiograms in the future. 

## 2013-01-02 NOTE — Patient Instructions (Addendum)
Your physician wants you to follow-up in: 6 MONTHS WITH DR CRENSHAW You will receive a reminder letter in the mail two months in advance. If you don't receive a letter, please call our office to schedule the follow-up appointment.  

## 2013-01-02 NOTE — Assessment & Plan Note (Signed)
Patient apparently with history of SVT and paroxysmal atrial fibrillation in [place]. She remains in sinus rhythm. Continue amiodarone. Check TSH, liver functions and chest x-ray at next office visit. Continue aspirin. Given mild dementia and overall frail status I would be hesitant to begin Coumadin.

## 2013-01-02 NOTE — Assessment & Plan Note (Signed)
Continue aspirin and statin. 

## 2013-01-05 ENCOUNTER — Ambulatory Visit (HOSPITAL_COMMUNITY): Payer: Medicare HMO

## 2013-01-08 ENCOUNTER — Other Ambulatory Visit: Payer: Self-pay | Admitting: Physician Assistant

## 2013-01-10 ENCOUNTER — Other Ambulatory Visit: Payer: Self-pay | Admitting: Family Medicine

## 2013-01-11 NOTE — Telephone Encounter (Signed)
Med filled.  

## 2013-01-12 ENCOUNTER — Ambulatory Visit (HOSPITAL_COMMUNITY)
Admission: RE | Admit: 2013-01-12 | Discharge: 2013-01-12 | Disposition: A | Discharge status: Home / Self Care | Payer: Medicare HMO | Source: Ambulatory Visit | Attending: Neurology | Admitting: Neurology

## 2013-01-12 DIAGNOSIS — I6789 Other cerebrovascular disease: Secondary | ICD-10-CM | POA: Insufficient documentation

## 2013-01-12 DIAGNOSIS — R413 Other amnesia: Secondary | ICD-10-CM | POA: Insufficient documentation

## 2013-01-12 DIAGNOSIS — G319 Degenerative disease of nervous system, unspecified: Secondary | ICD-10-CM | POA: Insufficient documentation

## 2013-01-12 DIAGNOSIS — R4189 Other symptoms and signs involving cognitive functions and awareness: Secondary | ICD-10-CM

## 2013-01-12 DIAGNOSIS — J320 Chronic maxillary sinusitis: Secondary | ICD-10-CM | POA: Insufficient documentation

## 2013-01-13 ENCOUNTER — Telehealth: Payer: Self-pay | Admitting: Neurology

## 2013-01-13 NOTE — Telephone Encounter (Signed)
Message copied by Benay Spice on Fri Jan 13, 2013  4:22 PM ------      Message from: JAFFE, ADAM R      Created: Fri Jan 13, 2013  5:55 AM       I reviewed the MRI of the brain.  There are no acute or worrisome findings, such as tumor. It does show some shrinkage of the brain, as well as spots on the brain, due to age, hypertension and heart disease.  This may be playing a role in memory as well.  The main thing is to continue her aspirin, cholesterol medication and blood pressure mediation as prescribed in order to help prevent strokes.  She should continue the Aricept if tolerated.  I want to see her around March (or sooner if needed).      ----- Message -----         From: Rad Results In Interface         Sent: 01/12/2013   1:22 PM           To: Cira Servant, DO                   ------

## 2013-01-13 NOTE — Telephone Encounter (Signed)
Spoke with the patient. Information given as per Dr. Everlena Cooper below. Patient tolerating the Aricept and does have a f/u appointment in March. Advised her to call the office if needed before then if problems or concerns. She states she will.

## 2013-01-17 ENCOUNTER — Other Ambulatory Visit: Payer: Self-pay | Admitting: Neurology

## 2013-01-17 MED ORDER — DONEPEZIL HCL 10 MG PO TABS: ORAL_TABLET | ORAL | Status: DC

## 2013-02-22 ENCOUNTER — Other Ambulatory Visit: Payer: Self-pay | Admitting: Family Medicine

## 2013-02-22 NOTE — Telephone Encounter (Signed)
Med filled.  

## 2013-04-27 ENCOUNTER — Other Ambulatory Visit: Payer: Self-pay | Admitting: Internal Medicine

## 2013-05-01 ENCOUNTER — Other Ambulatory Visit: Payer: Self-pay | Admitting: Internal Medicine

## 2013-05-03 ENCOUNTER — Other Ambulatory Visit: Payer: Self-pay

## 2013-05-03 MED ORDER — AMIODARONE HCL 200 MG PO TABS: ORAL_TABLET | ORAL | Status: DC

## 2013-05-03 MED ORDER — LISINOPRIL 2.5 MG PO TABS: ORAL_TABLET | ORAL | Status: DC

## 2013-05-03 MED ORDER — FOLIC ACID 1 MG PO TABS: ORAL_TABLET | ORAL | Status: DC

## 2013-05-03 NOTE — Telephone Encounter (Signed)
Patient called to request medication. She said we denied her med's. I made her aware the medications were sent to another provider. Med's sent     KP

## 2013-05-07 ENCOUNTER — Other Ambulatory Visit: Payer: Self-pay | Admitting: Physician Assistant

## 2013-05-09 ENCOUNTER — Other Ambulatory Visit: Payer: Self-pay | Admitting: Physician Assistant

## 2013-05-10 ENCOUNTER — Other Ambulatory Visit: Payer: Self-pay | Admitting: Family Medicine

## 2013-05-11 NOTE — Telephone Encounter (Signed)
Metoprolol refilled per protocol. JG//CMA 

## 2013-05-19 ENCOUNTER — Other Ambulatory Visit: Payer: Self-pay | Admitting: Family Medicine

## 2013-05-19 NOTE — Telephone Encounter (Signed)
Med filled.  

## 2013-05-22 ENCOUNTER — Other Ambulatory Visit: Payer: Self-pay | Admitting: Neurology

## 2013-05-22 ENCOUNTER — Telehealth: Payer: Self-pay | Admitting: Neurology

## 2013-05-22 MED ORDER — DONEPEZIL HCL 10 MG PO TABS: ORAL_TABLET | ORAL | Status: DC

## 2013-06-02 ENCOUNTER — Telehealth: Payer: Self-pay | Admitting: *Deleted

## 2013-06-02 NOTE — Telephone Encounter (Signed)
Spoke with patient who states she needs 90 day supplies of all medications in the future sent to [facility], her local pharamcy

## 2013-06-07 ENCOUNTER — Other Ambulatory Visit: Payer: Self-pay | Admitting: General Practice

## 2013-06-07 MED ORDER — OMEPRAZOLE 20 MG PO CPDR: DELAYED_RELEASE_CAPSULE | ORAL | Status: DC

## 2013-06-16 ENCOUNTER — Other Ambulatory Visit: Payer: Self-pay | Admitting: Internal Medicine

## 2013-06-16 ENCOUNTER — Telehealth: Payer: Self-pay | Admitting: Family Medicine

## 2013-06-16 MED ORDER — AMLODIPINE BESYLATE 10 MG PO TABS: ORAL_TABLET | ORAL | Status: DC

## 2013-06-16 MED ORDER — FOLIC ACID 1 MG PO TABS: ORAL_TABLET | ORAL | Status: DC

## 2013-06-16 MED ORDER — LISINOPRIL 2.5 MG PO TABS: ORAL_TABLET | ORAL | Status: DC

## 2013-06-16 MED ORDER — POLYETHYLENE GLYCOL 3350 17 GM/SCOOP PO POWD: 17.0000 g | Freq: Every day | ORAL | Status: DC

## 2013-06-16 MED ORDER — ALENDRONATE SODIUM 70 MG PO TABS: ORAL_TABLET | ORAL | Status: DC

## 2013-06-16 MED ORDER — DONEPEZIL HCL 10 MG PO TABS: ORAL_TABLET | ORAL | Status: DC

## 2013-06-16 MED ORDER — METOPROLOL TARTRATE 25 MG PO TABS: ORAL_TABLET | ORAL | Status: DC

## 2013-06-16 MED ORDER — ATORVASTATIN CALCIUM 40 MG PO TABS: ORAL_TABLET | ORAL | Status: DC

## 2013-06-16 MED ORDER — OMEPRAZOLE 20 MG PO CPDR: DELAYED_RELEASE_CAPSULE | ORAL | Status: DC

## 2013-06-16 NOTE — Telephone Encounter (Signed)
Please advise if ok for refills of all meds. Pt was last seen 11/2012. She does see neurology and cardiology.

## 2013-06-16 NOTE — Telephone Encounter (Signed)
Pt needs appt.  Ok to fill meds for #90, no refills.  Do NOT fill amiodarone- this is a med from cardiology.

## 2013-06-16 NOTE — Telephone Encounter (Signed)
meds were filled for 90 days. We do not fill amidarone. Please call pt to schedule an appt.

## 2013-06-16 NOTE — Telephone Encounter (Signed)
All meds sent in by JT.

## 2013-06-16 NOTE — Telephone Encounter (Signed)
Patient called and requested to have all of her medications be refilled for a 90 say supply. I asked patient to give me a list of her medications and she stated that its in her chart. Please advise.   Pharmacy [facility] [place]

## 2013-06-16 NOTE — Telephone Encounter (Signed)
Patient is schedule to come in on 08/17/2013 for a phys. thanks

## 2013-06-18 ENCOUNTER — Other Ambulatory Visit: Payer: Self-pay | Admitting: Family Medicine

## 2013-06-19 NOTE — Telephone Encounter (Signed)
med filled

## 2013-06-20 ENCOUNTER — Telehealth: Payer: Self-pay | Admitting: Neurology

## 2013-06-20 NOTE — Telephone Encounter (Signed)
Pt cancelled tomorrow's follow up appt w/ Dr. Everlena CooperJaffe. [facility]

## 2013-06-21 ENCOUNTER — Ambulatory Visit: Payer: Medicare HMO | Admitting: Neurology

## 2013-06-27 ENCOUNTER — Ambulatory Visit: Payer: Medicare HMO | Admitting: Neurology

## 2013-06-29 ENCOUNTER — Encounter: Payer: Self-pay | Admitting: Neurology

## 2013-06-29 ENCOUNTER — Ambulatory Visit (INDEPENDENT_AMBULATORY_CARE_PROVIDER_SITE_OTHER): Payer: Medicare HMO | Admitting: Neurology

## 2013-06-29 VITALS — BP 102/68 | HR 53 | Temp 97.0°F | Resp 18 | Ht 66.0 in | Wt 110.6 lb

## 2013-06-29 DIAGNOSIS — G3184 Mild cognitive impairment, so stated: Secondary | ICD-10-CM

## 2013-06-29 DIAGNOSIS — F0150 Vascular dementia without behavioral disturbance: Secondary | ICD-10-CM

## 2013-06-29 DIAGNOSIS — I679 Cerebrovascular disease, unspecified: Secondary | ICD-10-CM

## 2013-06-29 DIAGNOSIS — R413 Other amnesia: Secondary | ICD-10-CM

## 2013-06-29 NOTE — Progress Notes (Signed)
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  She is accompanied by her son and daughter-in-law.  Records and images were personally reviewed where available.    UPDATE: She was started on Aricept.  Overall she is tolerating it well.  She does have vivid dreams, but would like to continue the medication.  She and her son think she is doing a little better, particularly she is not zoning out as much.   01/12/13 MRI Brain wo reviewed:  moderate atrophy and advanced chronic microvascular ischemic changes.  HISTORY: She underwent coronary artery bypass graft x 5 in May 2014.  She began to have symptoms prior, but has gotten much worse since the procedure.  Her family first started noticing symptoms about two years ago.  She was forgetting how to perform certain tasks, such as using the TV remote.  She often repeats questions.  She needs help administering her medications.  She also says that she has difficulty figuring out what she wants to say.  She has strong history of anxiety and insomnia.  She has taken Ambien in the past but it made her sick.  She will often fall asleep easily during the day but is easily awoken.  She is able to perform daily activities such as bathing and dressing.  She is able to cook and use the stove, but only able to use the stove when somebody else is in the house.  She no longer drives.  There is no noticeable difficulty recalling names or recognizing people.  She has not had any change in behavior or personality.  She is sometimes irritable, but not combative. No hallucinations or delusions.  She has gait issues since a right hip replacement several years ago.  She has a shuffling gait and sometimes trips over things.  There are no abnormal movements noted.  No known family  history of dementia.  She usually sits alone at home.  She avoids social situations due to anxiety.  She worries a lot, particularly with new situations.  She did not get any sleep last night in anticipation for today's visit.  She feels a little sad at times, but not tearful.  No behavioral or personality changes.  11/21/12: TSH 1.74, B12 494, folate >24.8, WBC 8.9, Hgb 13.6, Hct 41.8, PLT 389, MCV 101.7, Na 136, K 4, Cl 102, CO2 28, glucose 103, BUN 10, Cr 0.8, Ca 9.4, AP 134, AST 28, ALT 26, albumin 3.6, amylase 60, lipase 44.  08/22/12 Carotid doppler:  no significant extracranial carotid artery stenosis. 08/21/12 2D Echo:  LVEF 60-65%, no atrial septum defect or PFO 04/28/12 LDL 115  MOCA score 12/21/12 was 18/30 (involving some memory, visuospatial and executive dysfunction)  PAST MEDICAL HISTORY: Past Medical History  Diagnosis Date  . Diverticulitis   . Hypertension   . Osteoporosis   . CAD (coronary artery disease)     MEDICATIONS: Current Outpatient Prescriptions on File Prior to Visit  Medication Sig Dispense Refill  . alendronate (FOSAMAX) 70 MG tablet TAKE 1 TABLET BY MOUTH EVERY 7 DAYS. TAKE WITH A FULL GLASS OF WATER ON AN EMPTY STOMACH  12 tablet  0  . amiodarone (PACERONE) 200 MG tablet TAKE 1 TABLET (200 MG TOTAL) BY MOUTH DAILY.  30 tablet  2  . amLODipine (NORVASC) 10 MG tablet TAKE  1 TABLET (10 MG TOTAL) BY MOUTH DAILY.  90 tablet  0  . aspirin EC 325 MG tablet Take 325 mg by mouth every evening.       Marland Kitchen atorvastatin (LIPITOR) 40 MG tablet TAKE 1 TABLET BY MOUTH DAILY AT 6 PM  30 tablet  0  . donepezil (ARICEPT) 10 MG tablet Take one tablet (10 mg) by mouth daily.  90 tablet  0  . folic acid (FOLVITE) 1 MG tablet TAKE 1 TABLET (1 MG TOTAL) BY MOUTH DAILY.  90 tablet  0  . lisinopril (PRINIVIL,ZESTRIL) 2.5 MG tablet TAKE 1 TABLET (2.5 MG TOTAL) BY MOUTH DAILY.  90 tablet  0  . metoprolol tartrate (LOPRESSOR) 25 MG tablet TAKE 1/2 BY MOUTH TWICE A DAY  90 tablet  0  .  Multiple Vitamin (MULTIVITAMIN) tablet Take 1 tablet by mouth daily.      Marland Kitchen omeprazole (PRILOSEC) 20 MG capsule TAKE 1 CAPSULE (20 MG TOTAL) BY MOUTH 2 (TWO) TIMES DAILY.  180 capsule  0  . polyethylene glycol powder (MIRALAX) powder Take 17 g by mouth daily.  255 g  0   No current facility-administered medications on file prior to visit.    ALLERGIES: No Known Allergies  FAMILY HISTORY: Family History  Problem Relation Age of Onset  . Hypertension Other   . Cancer Mother     stomach  . Diabetes Neg Hx   . Heart disease Neg Hx   . Stroke Neg Hx     SOCIAL HISTORY: History   Social History  . Marital Status: Widowed    Spouse Name: N/A    Number of Children: 2  . Years of Education: 13+   Occupational History  . Retired    Social History Main Topics  . Smoking status: Former Smoker    Types: Cigarettes  . Smokeless tobacco: Never Used  . Alcohol Use: No     Comment: Occasional  . Drug Use: No  . Sexual Activity: No   Other Topics Concern  . Not on file   Social History Narrative   Regular exercise-no   Caffeine Use-yes    REVIEW OF SYSTEMS: Constitutional: No fevers, chills, or sweats, no generalized fatigue, change in appetite Eyes: No visual changes, double vision, eye pain Ear, nose and throat: No hearing loss, ear pain, nasal congestion, sore throat Cardiovascular: No chest pain, palpitations Respiratory:  No shortness of breath at rest or with exertion, wheezes GastrointestinaI: No nausea, vomiting, diarrhea, abdominal pain, fecal incontinence Genitourinary:  No dysuria, urinary retention or frequency Musculoskeletal:  No neck pain, back pain Integumentary: No rash, pruritus, skin lesions Neurological: as above Psychiatric: No depression, insomnia, anxiety Endocrine: No palpitations, fatigue, diaphoresis, mood swings, change in appetite, change in weight, increased thirst Hematologic/Lymphatic:  No anemia, purpura, petechiae. Allergic/Immunologic:  no itchy/runny eyes, nasal congestion, recent allergic reactions, rashes  PHYSICAL EXAM: Filed Vitals:   06/29/13 0803  BP: 102/68  Pulse: 53  Temp: 97 F (36.1 C)  Resp: 18   General: No acute distress Head:  Normocephalic/atraumatic Neck: supple, no paraspinal tenderness, full range of motion Heart:  Regular rate and rhythm Lungs:  Clear to auscultation bilaterally Back: No paraspinal tenderness Neurological Exam: alert and oriented to person, place, and time. Attention span and concentration intact, recalled 2 out of 5 words after 5 minutes, able to name, repeat, follow complex commands across midline, attention fairly intact (did not repeat list of numbers correctly in reverse order), missed on calculation out  of 5 with serial 7 subtraction, reduced naming fluency, poor visuospatial and executive functioning in regards to trail making test, copying a cube and drawing a clock, fund of knowledge intact.  MOCA 21/30.  Speech fluent and not dysarthric.  CN II-XII intact. Fundi not visualized.  Bulk and tone normal, muscle strength 5/5 throughout.  No bradykinesia, resting tremor or rigidity.  Mild head tremor.  Sensation to light touch and vibration intact, reduced temperature in the toes.  Deep tendon reflexes 2+ throughout, except absent in ankles, toes downgoing.  Finger to nose intact.  Gait shuffling and unsteady, Romberg with sway.  IMPRESSION: Mult-domain mild cognitive impairment.  I suspect amnestic-type mild cognitive impairment with possible vascular component (thus involving visuospatial and excecutive functioning).  PLAN: 1.  Continue Aricept 10mg  daily 2.  Continue ASA and Statin.  Will recheck fasting lipid panel (LDL goal should be less than 100). 3.  Mediterranean diet. 4.  Follow up in 6 months.  Shon Millet, DO  CC:  [name], MD

## 2013-06-29 NOTE — Patient Instructions (Signed)
1.  I would continue the aricept 10mg  daily 2.  Work on stroke prevention:  Aspirin, statin, Mediterranean diet 3.  We will check cholesterol level 4.  Follow up in 6 months.

## 2013-07-24 ENCOUNTER — Ambulatory Visit (INDEPENDENT_AMBULATORY_CARE_PROVIDER_SITE_OTHER): Payer: Medicare HMO | Admitting: Cardiology

## 2013-07-24 ENCOUNTER — Encounter: Payer: Self-pay | Admitting: Cardiology

## 2013-07-24 VITALS — BP 185/82 | HR 50 | Ht 66.0 in | Wt 111.0 lb

## 2013-07-24 DIAGNOSIS — I10 Essential (primary) hypertension: Secondary | ICD-10-CM

## 2013-07-24 DIAGNOSIS — I359 Nonrheumatic aortic valve disorder, unspecified: Secondary | ICD-10-CM

## 2013-07-24 DIAGNOSIS — I350 Nonrheumatic aortic (valve) stenosis: Secondary | ICD-10-CM

## 2013-07-24 DIAGNOSIS — I2510 Atherosclerotic heart disease of native coronary artery without angina pectoris: Secondary | ICD-10-CM

## 2013-07-24 MED ORDER — LISINOPRIL 10 MG PO TABS: 10.0000 mg | ORAL_TABLET | Freq: Every day | ORAL | Status: DC

## 2013-07-24 NOTE — Assessment & Plan Note (Signed)
Patient apparently with history of atrial fibrillation and SVT. Continue amiodarone. Check TSH, liver functions and chest x-ray. Continue aspirin. I would be hesitant to anticoagulate given frail body habitus and history of mild dementia.

## 2013-07-24 NOTE — Assessment & Plan Note (Signed)
Blood pressure elevated. Increase lisinopril to 10 mg daily. Check potassium and renal function in one week.

## 2013-07-24 NOTE — Assessment & Plan Note (Signed)
Continue aspirin and statin. Check lipids and liver. 

## 2013-07-24 NOTE — Patient Instructions (Signed)
Your physician recommends that you schedule a follow-up appointment in: 6 WEEKS WITH DR CRENSHAW  INCREASE LISINOPRIL TO 10 MG DAILY  Your physician recommends that you return for lab work in: 1 WEEK 4/20  A chest x-ray takes a picture of the organs and structures inside the chest, including the heart, lungs, and blood vessels. This test can show several things, including, whether the heart is enlarges; whether fluid is building up in the lungs; and whether pacemaker / defibrillator leads are still in place.  1 WEEK 4/20  TRACK YOUR BP AT HOME AND CALL US IF IT IS HIGH

## 2013-07-24 NOTE — Assessment & Plan Note (Signed)
Plan followup echocardiograms in the future. 

## 2013-07-24 NOTE — Progress Notes (Signed)
HPI: Fu CAD. Patient apparently on amiodarone in the past for paroxysmal atrial fibrillation. Also with SVT. Echocardiogram in May 2014 showed normal LV function, mild aortic stenosis with a mean gradient of 11 mm of mercury, and mild LAE. Carotid Dopplers in May of 2014 showed no significant stenosis. Cardiac cath in May of 2014 showed 3 vessel CAD. She subsequently underwent CABG x 5 per Dr. Laneta SimmersBartle with LIMA to LAD, SVG to DX, SVG to 1st and 2nd OM, SVG to RCA. Since she was last seen in Sept 2014, the patient denies any dyspnea on exertion, orthopnea, PND, pedal edema, palpitations, syncope or chest pain.    Current Outpatient Prescriptions  Medication Sig Dispense Refill  . alendronate (FOSAMAX) 70 MG tablet TAKE 1 TABLET BY MOUTH EVERY 7 DAYS. TAKE WITH A FULL GLASS OF WATER ON AN EMPTY STOMACH  12 tablet  0  . amiodarone (PACERONE) 200 MG tablet TAKE 1 TABLET (200 MG TOTAL) BY MOUTH DAILY.  30 tablet  2  . amLODipine (NORVASC) 10 MG tablet TAKE 1 TABLET (10 MG TOTAL) BY MOUTH DAILY.  90 tablet  0  . aspirin EC 325 MG tablet Take 325 mg by mouth every evening.       Marland Kitchen. atorvastatin (LIPITOR) 40 MG tablet TAKE 1 TABLET BY MOUTH DAILY AT 6 PM  30 tablet  0  . donepezil (ARICEPT) 10 MG tablet Take one tablet (10 mg) by mouth daily.  90 tablet  0  . folic acid (FOLVITE) 1 MG tablet TAKE 1 TABLET (1 MG TOTAL) BY MOUTH DAILY.  90 tablet  0  . lisinopril (PRINIVIL,ZESTRIL) 2.5 MG tablet TAKE 1 TABLET (2.5 MG TOTAL) BY MOUTH DAILY.  90 tablet  0  . metoprolol tartrate (LOPRESSOR) 25 MG tablet TAKE 1/2 BY MOUTH TWICE A DAY  90 tablet  0  . Multiple Vitamin (MULTIVITAMIN) tablet Take 1 tablet by mouth daily.      Marland Kitchen. omeprazole (PRILOSEC) 20 MG capsule TAKE 1 CAPSULE (20 MG TOTAL) BY MOUTH 2 (TWO) TIMES DAILY.  180 capsule  0   No current facility-administered medications for this visit.     Past Medical History  Diagnosis Date  . Diverticulitis   . Hypertension   . Osteoporosis   . CAD  (coronary artery disease)     Past Surgical History  Procedure Laterality Date  . Fracture surgery    . Partial colectomy    . Coronary artery bypass graft N/A 08/25/2012    Procedure: CORONARY ARTERY BYPASS GRAFTING ;  Surgeon: Alleen BorneBryan K Bartle, MD;  Location: [facility] OR;  Service: Open Heart Surgery;  Laterality: N/A;  four bypasses total     History   Social History  . Marital Status: Widowed    Spouse Name: N/A    Number of Children: 2  . Years of Education: 13+   Occupational History  . Retired    Social History Main Topics  . Smoking status: Former Smoker    Types: Cigarettes  . Smokeless tobacco: Never Used  . Alcohol Use: No     Comment: Occasional  . Drug Use: No  . Sexual Activity: No   Other Topics Concern  . Not on file   Social History Narrative   Regular exercise-no   Caffeine Use-yes    ROS: no fevers or chills, productive cough, hemoptysis, dysphasia, odynophagia, melena, hematochezia, dysuria, hematuria, rash, seizure activity, orthopnea, PND, pedal edema, claudication. Remaining systems are negative.  Physical Exam:  Well-developed well-nourished in no acute distress.  Skin is warm and dry.  HEENT is normal.  Neck is supple.  Chest is clear to auscultation with normal expansion.  Cardiovascular exam is regular rate and rhythm. 2/6 systolic murmur left sternal border. Abdominal exam nontender or distended. No masses palpated. Extremities show no edema. neuro grossly intact  ECG Sinus bradycardia at a rate of 50. IVCD. No significant ST changes. First degree AV block.

## 2013-07-31 ENCOUNTER — Other Ambulatory Visit: Payer: Medicare HMO

## 2013-07-31 ENCOUNTER — Other Ambulatory Visit: Payer: Self-pay | Admitting: Family Medicine

## 2013-07-31 NOTE — Telephone Encounter (Signed)
Rx sent to the pharmacy by e-script.//AB/CMA 

## 2013-08-01 ENCOUNTER — Other Ambulatory Visit (INDEPENDENT_AMBULATORY_CARE_PROVIDER_SITE_OTHER): Payer: Medicare HMO

## 2013-08-01 ENCOUNTER — Ambulatory Visit (INDEPENDENT_AMBULATORY_CARE_PROVIDER_SITE_OTHER)
Admission: RE | Admit: 2013-08-01 | Discharge: 2013-08-01 | Disposition: A | Discharge status: Home / Self Care | Payer: Medicare HMO | Source: Ambulatory Visit | Attending: Cardiology | Admitting: Cardiology

## 2013-08-01 ENCOUNTER — Other Ambulatory Visit: Payer: Self-pay | Admitting: Family Medicine

## 2013-08-01 DIAGNOSIS — I359 Nonrheumatic aortic valve disorder, unspecified: Secondary | ICD-10-CM

## 2013-08-01 DIAGNOSIS — I350 Nonrheumatic aortic (valve) stenosis: Secondary | ICD-10-CM

## 2013-08-01 DIAGNOSIS — I10 Essential (primary) hypertension: Secondary | ICD-10-CM

## 2013-08-01 DIAGNOSIS — I2510 Atherosclerotic heart disease of native coronary artery without angina pectoris: Secondary | ICD-10-CM

## 2013-08-01 LAB — HEPATIC FUNCTION PANEL
ALBUMIN: 3.5 g/dL (ref 3.5–5.2)
ALT: 32 U/L (ref 0–35)
AST: 35 U/L (ref 0–37)
Alkaline Phosphatase: 65 U/L (ref 39–117)
Bilirubin, Direct: 0.1 mg/dL (ref 0.0–0.3)
Total Bilirubin: 0.7 mg/dL (ref 0.3–1.2)
Total Protein: 6.8 g/dL (ref 6.0–8.3)

## 2013-08-01 LAB — BASIC METABOLIC PANEL
BUN: 16 mg/dL (ref 6–23)
CHLORIDE: 102 meq/L (ref 96–112)
CO2: 25 meq/L (ref 19–32)
Calcium: 9.3 mg/dL (ref 8.4–10.5)
Creatinine, Ser: 0.7 mg/dL (ref 0.4–1.2)
GFR: 82.28 mL/min (ref >60.00)
Glucose, Bld: 114 mg/dL — ABNORMAL HIGH (ref 70–99)
Potassium: 4.1 mEq/L (ref 3.5–5.1)
SODIUM: 137 meq/L (ref 135–145)

## 2013-08-01 LAB — LIPID PANEL
CHOL/HDL RATIO: 2
Cholesterol: 105 mg/dL (ref 0–200)
HDL: 53.7 mg/dL (ref >39.00)
LDL CALC: 39 mg/dL (ref 0–99)
TRIGLYCERIDES: 62 mg/dL (ref 0.0–149.0)
VLDL: 12.4 mg/dL (ref 0.0–40.0)

## 2013-08-01 LAB — TSH: TSH: 2.44 u[IU]/mL (ref 0.35–5.50)

## 2013-08-01 NOTE — Telephone Encounter (Signed)
Med filled due to CPE next month 

## 2013-08-02 ENCOUNTER — Telehealth: Payer: Self-pay | Admitting: Family Medicine

## 2013-08-02 NOTE — Telephone Encounter (Signed)
Relevant patient education assigned to patient using Emmi. ° °

## 2013-08-10 ENCOUNTER — Encounter (HOSPITAL_COMMUNITY): Payer: Self-pay | Admitting: Emergency Medicine

## 2013-08-10 ENCOUNTER — Emergency Department (HOSPITAL_COMMUNITY): Payer: Medicare HMO

## 2013-08-10 ENCOUNTER — Emergency Department (HOSPITAL_COMMUNITY)
Admission: EM | Admit: 2013-08-10 | Discharge: 2013-08-10 | Disposition: A | Discharge status: Home / Self Care | Payer: Medicare HMO | Attending: Emergency Medicine | Admitting: Emergency Medicine

## 2013-08-10 DIAGNOSIS — I2510 Atherosclerotic heart disease of native coronary artery without angina pectoris: Secondary | ICD-10-CM | POA: Insufficient documentation

## 2013-08-10 DIAGNOSIS — T148XXA Other injury of unspecified body region, initial encounter: Secondary | ICD-10-CM

## 2013-08-10 DIAGNOSIS — Z79899 Other long term (current) drug therapy: Secondary | ICD-10-CM | POA: Insufficient documentation

## 2013-08-10 DIAGNOSIS — Z7982 Long term (current) use of aspirin: Secondary | ICD-10-CM | POA: Insufficient documentation

## 2013-08-10 DIAGNOSIS — Z8719 Personal history of other diseases of the digestive system: Secondary | ICD-10-CM | POA: Insufficient documentation

## 2013-08-10 DIAGNOSIS — W010XXA Fall on same level from slipping, tripping and stumbling without subsequent striking against object, initial encounter: Secondary | ICD-10-CM | POA: Insufficient documentation

## 2013-08-10 DIAGNOSIS — Z87891 Personal history of nicotine dependence: Secondary | ICD-10-CM | POA: Insufficient documentation

## 2013-08-10 DIAGNOSIS — S0003XA Contusion of scalp, initial encounter: Secondary | ICD-10-CM | POA: Insufficient documentation

## 2013-08-10 DIAGNOSIS — IMO0002 Reserved for concepts with insufficient information to code with codable children: Secondary | ICD-10-CM | POA: Insufficient documentation

## 2013-08-10 DIAGNOSIS — J309 Allergic rhinitis, unspecified: Secondary | ICD-10-CM | POA: Insufficient documentation

## 2013-08-10 DIAGNOSIS — Z951 Presence of aortocoronary bypass graft: Secondary | ICD-10-CM | POA: Insufficient documentation

## 2013-08-10 DIAGNOSIS — I10 Essential (primary) hypertension: Secondary | ICD-10-CM | POA: Insufficient documentation

## 2013-08-10 DIAGNOSIS — Y939 Activity, unspecified: Secondary | ICD-10-CM | POA: Insufficient documentation

## 2013-08-10 DIAGNOSIS — S1093XA Contusion of unspecified part of neck, initial encounter: Secondary | ICD-10-CM

## 2013-08-10 DIAGNOSIS — Z8739 Personal history of other diseases of the musculoskeletal system and connective tissue: Secondary | ICD-10-CM | POA: Insufficient documentation

## 2013-08-10 DIAGNOSIS — J302 Other seasonal allergic rhinitis: Secondary | ICD-10-CM

## 2013-08-10 DIAGNOSIS — S0083XA Contusion of other part of head, initial encounter: Secondary | ICD-10-CM | POA: Insufficient documentation

## 2013-08-10 DIAGNOSIS — Y92009 Unspecified place in unspecified non-institutional (private) residence as the place of occurrence of the external cause: Secondary | ICD-10-CM | POA: Insufficient documentation

## 2013-08-10 MED ORDER — FLUTICASONE PROPIONATE 50 MCG/ACT NA SUSP: 1.0000 | Freq: Every day | NASAL | Status: DC

## 2013-08-10 MED ORDER — DESLORATADINE 5 MG PO TABS: 5.0000 mg | ORAL_TABLET | Freq: Every day | ORAL | Status: DC

## 2013-08-10 NOTE — ED Notes (Addendum)
Per EMS: Pt reports being in the kitchen and fell after tripping over a vacuum cleaner. Pt reports falling backwards landing on her bottom and hitting the back of her head. Pt reports history of a tailbone fracture approximately 5 years ago. Pt denies LOC or being on blood thinning medications. Pt denies pain. Pt is A/O and in NAD.

## 2013-08-10 NOTE — Discharge Instructions (Signed)
Contusion A contusion is a deep bruise. Contusions happen when an injury causes bleeding under the skin. Signs of bruising include pain, puffiness (swelling), and discolored skin. The contusion may turn blue, purple, or yellow. HOME CARE   Put ice on the injured area.  Put ice in a plastic bag.  Place a towel between your skin and the bag.  Leave the ice on for 15-20 minutes, 03-04 times a day.  Only take medicine as told by your doctor.  Rest the injured area.  If possible, raise (elevate) the injured area to lessen puffiness. GET HELP RIGHT AWAY IF:   You have more bruising or puffiness.  You have pain that is getting worse.  Your puffiness or pain is not helped by medicine. MAKE SURE YOU:   Understand these instructions.  Will watch your condition.  Will get help right away if you are not doing well or get worse. Document Released: 09/16/2007 Document Revised: 06/22/2011 Document Reviewed: 02/02/2011 [facility] Patient Information 2014 [place], [place].  Allergic Rhinitis Allergic rhinitis is when the mucous membranes in the nose respond to allergens. Allergens are particles in the air that cause your body to have an allergic reaction. This causes you to release allergic antibodies. Through a chain of events, these eventually cause you to release histamine into the blood stream. Although meant to protect the body, it is this release of histamine that causes your discomfort, such as frequent sneezing, congestion, and an itchy, runny nose.  CAUSES  Seasonal allergic rhinitis (hay fever) is caused by pollen allergens that may come from grasses, trees, and weeds. Year-round allergic rhinitis (perennial allergic rhinitis) is caused by allergens such as house dust mites, pet dander, and mold spores.  SYMPTOMS   Nasal stuffiness (congestion).  Itchy, runny nose with sneezing and tearing of the eyes. DIAGNOSIS  Your health care provider can help you determine the allergen or  allergens that trigger your symptoms. If you and your health care provider are unable to determine the allergen, skin or blood testing may be used. TREATMENT  Allergic Rhinitis does not have a cure, but it can be controlled by:  Medicines and allergy shots (immunotherapy).  Avoiding the allergen. Hay fever may often be treated with antihistamines in pill or nasal spray forms. Antihistamines block the effects of histamine. There are over-the-counter medicines that may help with nasal congestion and swelling around the eyes. Check with your health care provider before taking or giving this medicine.  If avoiding the allergen or the medicine prescribed do not work, there are many new medicines your health care provider can prescribe. Stronger medicine may be used if initial measures are ineffective. Desensitizing injections can be used if medicine and avoidance does not work. Desensitization is when a patient is given ongoing shots until the body becomes less sensitive to the allergen. Make sure you follow up with your health care provider if problems continue. HOME CARE INSTRUCTIONS It is not possible to completely avoid allergens, but you can reduce your symptoms by taking steps to limit your exposure to them. It helps to know exactly what you are allergic to so that you can avoid your specific triggers. SEEK MEDICAL CARE IF:   You have a fever.  You develop a cough that does not stop easily (persistent).  You have shortness of breath.  You start wheezing.  Symptoms interfere with normal daily activities. Document Released: 12/23/2000 Document Revised: 01/18/2013 Document Reviewed: 12/05/2012 [facility] Patient Information 2014 [place], [place].

## 2013-08-10 NOTE — ED Provider Notes (Signed)
CSN: [id number]     Arrival date & time 08/10/13  1130 History   First MD Initiated Contact with Patient 08/10/13 1147     Chief Complaint  Patient presents with  . Fall     (Consider location/radiation/quality/duration/timing/severity/associated sxs/prior Treatment) HPI Comments: Patient presents to the ER for evaluation after a fall. Patient reports that she slipped and fell in her kitchen earlier today. She fell backwards, landing on her backside. She is complaining of pain in the buttock and coccyx region. She did hit the back of her head, but there was no loss of consciousness. Denies neck and upper back pain.  Patient was unable to get up on her own. EMS did come to the house and was able to get her up onto her feet and she was able to ambulate out of the house. She reports that the pain was moderate initially, but here in the ER her pain has completely resolved.  Patient is a 80 y.o. female presenting with fall.  Fall Associated symptoms include headaches.    Past Medical History  Diagnosis Date  . Diverticulitis   . Hypertension   . Osteoporosis   . CAD (coronary artery disease)    Past Surgical History  Procedure Laterality Date  . Fracture surgery    . Partial colectomy    . Coronary artery bypass graft N/A 08/25/2012    Procedure: CORONARY ARTERY BYPASS GRAFTING ;  Surgeon: Alleen Borne, MD;  Location: [facility] OR;  Service: Open Heart Surgery;  Laterality: N/A;  four bypasses total    Family History  Problem Relation Age of Onset  . Hypertension Other   . Cancer Mother     stomach  . Diabetes Neg Hx   . Heart disease Neg Hx   . Stroke Neg Hx    History  Substance Use Topics  . Smoking status: Former Smoker    Types: Cigarettes  . Smokeless tobacco: Never Used  . Alcohol Use: No     Comment: Occasional   OB History   Grav Para Term Preterm Abortions TAB SAB Ect Mult Living                 Review of Systems  Musculoskeletal: Negative for back pain and neck  pain.  Neurological: Positive for headaches.  All other systems reviewed and are negative.     Allergies  Review of patient's allergies indicates no known allergies.  Home Medications   Prior to Admission medications   Medication Sig Start Date End Date Taking? Authorizing Provider  amiodarone (PACERONE) 200 MG tablet Take 200 mg by mouth daily.   Yes Historical Provider, MD  aspirin EC 325 MG tablet Take 325 mg by mouth every evening.    Yes Historical Provider, MD  bisacodyl (DULCOLAX) 5 MG EC tablet Take 5 mg by mouth at bedtime.   Yes Historical Provider, MD  docusate sodium (COLACE) 100 MG capsule Take 100 mg by mouth 2 (two) times daily.   Yes Historical Provider, MD  donepezil (ARICEPT) 10 MG tablet Take 10 mg by mouth at bedtime.   Yes Historical Provider, MD  folic acid (FOLVITE) 1 MG tablet Take 1 mg by mouth daily.   Yes Historical Provider, MD  lisinopril (PRINIVIL,ZESTRIL) 10 MG tablet Take 10 mg by mouth daily.   Yes Historical Provider, MD  metoprolol tartrate (LOPRESSOR) 25 MG tablet Take 12.5 mg by mouth 2 (two) times daily.   Yes Historical Provider, MD  Multiple Vitamin (MULTIVITAMIN) tablet  Take 1 tablet by mouth daily.   Yes Historical Provider, MD  omeprazole (PRILOSEC) 20 MG capsule Take 20 mg by mouth daily.   Yes Historical Provider, MD   BP 166/67  Pulse 51  Temp(Src) 98.4 F (36.9 C) (Oral)  Resp 12  SpO2 97% Physical Exam  Constitutional: She is oriented to person, place, and time. She appears well-developed and well-nourished. No distress.  HENT:  Head: Normocephalic. Head is with contusion.    Right Ear: Hearing normal.  Left Ear: Hearing normal.  Nose: Nose normal.  Mouth/Throat: Oropharynx is clear and moist and mucous membranes are normal.  Eyes: Conjunctivae and EOM are normal. Pupils are equal, round, and reactive to light.  Neck: Normal range of motion. Neck supple. No spinous process tenderness and no muscular tenderness present.    Cardiovascular: Regular rhythm, S1 normal and S2 normal.  Exam reveals no gallop and no friction rub.   No murmur heard. Pulmonary/Chest: Effort normal and breath sounds normal. No respiratory distress. She exhibits no tenderness.  Abdominal: Soft. Normal appearance and bowel sounds are normal. There is no hepatosplenomegaly. There is no tenderness. There is no rebound, no guarding, no tenderness at McBurney's point and negative Murphy's sign. No hernia.  Musculoskeletal: Normal range of motion.       Cervical back: Normal. She exhibits no tenderness.       Thoracic back: Normal. She exhibits no tenderness.       Lumbar back: Normal. She exhibits no tenderness, no bony tenderness, no swelling and no deformity.  Neurological: She is alert and oriented to person, place, and time. She has normal strength. No cranial nerve deficit or sensory deficit. Coordination normal. GCS eye subscore is 4. GCS verbal subscore is 5. GCS motor subscore is 6.  Skin: Skin is warm, dry and intact. No rash noted. No cyanosis.  Psychiatric: She has a normal mood and affect. Her speech is normal and behavior is normal. Thought content normal.    ED Course  Procedures (including critical care time) Labs Review Labs Reviewed - No data to display  Imaging Review Dg Chest 2 View  08/10/2013   CLINICAL DATA:  Fall.  EXAM: CHEST  2 VIEW  COMPARISON:  08/01/2013.  FINDINGS: Mediastinum hilar structures normal. Lungs are clear. Prior CABG. Heart size borderline enlarged and unchanged. No pulmonary venous congestion. Skin fold over the right chest. No acute bony abnormality identified. Degenerative changes thoracic spine. Stable lower thoracic spine compression fracture. IVC filter noted projected over the upper abdomen on lateral chest x-ray .  IMPRESSION: 1. Prior CABG.  Stable cardiomegaly. 2. No acute abnormality.   Electronically Signed   By: Maisie Fushomas  Register   On: 08/10/2013 13:30   Dg Lumbar Spine Complete  08/10/2013    CLINICAL DATA:  Fall  EXAM: LUMBAR SPINE - COMPLETE 4+ VIEW  COMPARISON:  CT 11/07/2012  FINDINGS: There is a severe compression fracture at T12 unchanged from comparison CT. No acute loss of vertebral body height or disc height. Osteopenia noted. Infrarenal IVC filter noted.  IMPRESSION: 1. No acute findings lumbar spine. 2. Chronic severe compression fracture of T12. 3. Osteopenia.  Again a seroma   Electronically Signed   By: Genevive BiStewart  Edmunds M.D.   On: 08/10/2013 13:36   Dg Hip Bilateral W/pelvis  08/10/2013   CLINICAL DATA:  Fall.  EXAM: BILATERAL HIP WITH PELVIS - 4+ VIEW  COMPARISON:  MRI 11/26/2012, CT 11/07/2012.  FINDINGS: Degenerative changes lumbar spine and both hips. Right  hip replacement. Diffuse osteopenia. Old bilateral inferior pubic ramus fracture present.  IMPRESSION: No acute abnormality. Right hip replacement with good anatomic alignment. Old bilateral inferior pubic rami fractures.   Electronically Signed   By: Maisie Fushomas  Register   On: 08/10/2013 13:33   Ct Head Wo Contrast  08/10/2013   CLINICAL DATA:  Fall.  Head trauma.  EXAM: CT HEAD WITHOUT CONTRAST  TECHNIQUE: Contiguous axial images were obtained from the base of the skull through the vertex without intravenous contrast.  COMPARISON:  None.  FINDINGS: There is no evidence of intracranial hemorrhage, brain edema, or other signs of acute infarction. There is no evidence of intracranial mass lesion or mass effect. No abnormal extraaxial fluid collections are identified.  Mild diffuse cerebral atrophy noted. Extensive chronic small vessel disease is seen as well as and old lacunar infarct involving the right lentiform nucleus. Ventricles are normal in size. No skull fracture identified.  IMPRESSION: No acute intracranial abnormality.  Mild cerebral atrophy, severe chronic small vessel disease, and old right basal ganglia lacune.   Electronically Signed   By: Myles RosenthalJohn  Stahl M.D.   On: 08/10/2013 13:44     EKG Interpretation None       MDM   Final diagnoses:  Contusion    Presented for evaluation after a ground-level fall. Patient reports losing her balance and falling backwards. There was no loss of consciousness. She did hit her head, CT of her head was unremarkable. Examination of the cervical, thoracic, lumbo sacral spine revealed no evidence of tenderness. She had complained of pain initially in the coccyx region, but there was no coccygeal tenderness or deformity. X-ray of lumbar spine was negative. Likewise, bilateral hip and pelvis was performed and no fractures were noted.  The patient did report that she has had some nasal congestion and drainage today, thinks it's allergies. She has had mild cough has been nonproductive. Oxygen saturation is normal, she is afebrile. Chest x-ray did not show any evidence of pneumonia.    Gilda Creasehristopher J. Zerek Litsey, MD 08/12/13 [phone]

## 2013-08-16 ENCOUNTER — Telehealth: Payer: Self-pay

## 2013-08-16 NOTE — Telephone Encounter (Addendum)
Patient provided the following information (she has a hx of confusion, but was oriented for the call):  Patient's son is suppose to bring and accompany patient during her visit.    Medication and allergies:  Reviewed and updated; patient was encouraged to bring in her medications to verify accuracy.  She had a list that she was reading from, but was unsure of the dosage of some of her medications.    90 day supply/mail order: n/a Local pharmacy:  [id number] - [place], [place] - [address]   Immunizations due:  Shingles   A/P: No changes to personal, family history or past surgical hx CCS- 5 years ago per patient- unsure of results [facility]- 01/04/12- Left breast--heterogeneous fibroglandular tissue, scattered calcifications, asymmetric density---Left breast Ultrasound- 2 small hypoechoic lesions BD- 05/08/11- osteoporosis Tdap- 04/28/06 PNA- 04/14/2011 Shingles- has not received  To Discuss with Provider: Nothing at this time.

## 2013-08-17 ENCOUNTER — Ambulatory Visit (HOSPITAL_BASED_OUTPATIENT_CLINIC_OR_DEPARTMENT_OTHER)
Admission: RE | Admit: 2013-08-17 | Discharge: 2013-08-17 | Disposition: A | Discharge status: Home / Self Care | Payer: Medicare HMO | Source: Ambulatory Visit | Attending: Family Medicine | Admitting: Family Medicine

## 2013-08-17 ENCOUNTER — Encounter: Payer: Self-pay | Admitting: Family Medicine

## 2013-08-17 ENCOUNTER — Ambulatory Visit (INDEPENDENT_AMBULATORY_CARE_PROVIDER_SITE_OTHER): Payer: Medicare HMO | Admitting: Family Medicine

## 2013-08-17 VITALS — BP 126/78 | HR 57 | Temp 98.0°F | Resp 18 | Wt 109.4 lb

## 2013-08-17 DIAGNOSIS — R0602 Shortness of breath: Secondary | ICD-10-CM | POA: Insufficient documentation

## 2013-08-17 DIAGNOSIS — I700 Atherosclerosis of aorta: Secondary | ICD-10-CM | POA: Insufficient documentation

## 2013-08-17 DIAGNOSIS — R059 Cough, unspecified: Secondary | ICD-10-CM | POA: Insufficient documentation

## 2013-08-17 DIAGNOSIS — R05 Cough: Secondary | ICD-10-CM | POA: Insufficient documentation

## 2013-08-17 LAB — BASIC METABOLIC PANEL
BUN: 14 mg/dL (ref 6–23)
CALCIUM: 8.8 mg/dL (ref 8.4–10.5)
CO2: 26 meq/L (ref 19–32)
Chloride: 106 mEq/L (ref 96–112)
Creatinine, Ser: 0.8 mg/dL (ref 0.4–1.2)
GFR: 72.85 mL/min (ref >60.00)
Glucose, Bld: 89 mg/dL (ref 70–99)
Potassium: 3.5 mEq/L (ref 3.5–5.1)
Sodium: 138 mEq/L (ref 135–145)

## 2013-08-17 LAB — CBC WITH DIFFERENTIAL/PLATELET
BASOS ABS: 0 10*3/uL (ref 0.0–0.1)
Basophils Relative: 0.3 % (ref 0.0–3.0)
Eosinophils Absolute: 0.1 10*3/uL (ref 0.0–0.7)
Eosinophils Relative: 1.5 % (ref 0.0–5.0)
HCT: 36.4 % (ref 36.0–46.0)
HEMOGLOBIN: 12 g/dL (ref 12.0–15.0)
Lymphocytes Relative: 17.8 % (ref 12.0–46.0)
Lymphs Abs: 1.1 10*3/uL (ref 0.7–4.0)
MCHC: 32.9 g/dL (ref 30.0–36.0)
MCV: 101.1 fl — ABNORMAL HIGH (ref 78.0–100.0)
Monocytes Absolute: 0.4 10*3/uL (ref 0.1–1.0)
Monocytes Relative: 6 % (ref 3.0–12.0)
Neutro Abs: 4.7 10*3/uL (ref 1.4–7.7)
Neutrophils Relative %: 74.4 % (ref 43.0–77.0)
Platelets: 223 10*3/uL (ref 150.0–400.0)
RBC: 3.6 Mil/uL — ABNORMAL LOW (ref 3.87–5.11)
RDW: 15.8 % — AB (ref 11.5–15.5)
WBC: 6.3 10*3/uL (ref 4.0–10.5)

## 2013-08-17 LAB — BRAIN NATRIURETIC PEPTIDE: Pro B Natriuretic peptide (BNP): 152 pg/mL — ABNORMAL HIGH (ref 0.0–100.0)

## 2013-08-17 MED ORDER — AMOXICILLIN-POT CLAVULANATE 875-125 MG PO TABS: 1.0000 | ORAL_TABLET | Freq: Two times a day (BID) | ORAL | Status: DC

## 2013-08-17 MED ORDER — GUAIFENESIN-CODEINE 100-10 MG/5ML PO SYRP: 10.0000 mL | ORAL_SOLUTION | Freq: Three times a day (TID) | ORAL | Status: DC | PRN

## 2013-08-17 NOTE — Patient Instructions (Addendum)
Go to the [facility] and get your chest xray ([address] Come back and see me on Tuesday at 9:45 am Start the Augmentin twice daily Use the cough syrup as needed- will cause drowsiness We'll notify you of your lab results and make any changes if needed If your breathing worsens or you have other concerns- please go to the ER Call with any questions or concerns Hang in there!!

## 2013-08-17 NOTE — Progress Notes (Signed)
Pre visit review using our clinic review tool, if applicable. No additional management support is needed unless otherwise documented below in the visit note. 

## 2013-08-17 NOTE — Assessment & Plan Note (Signed)
New.  Based on pt's distress and crackles on PE suspect PNA.  Pt had fall last week and I wonder if she has been splinting and she developed PNA due to atelectasis.  Get CXR.  Start abx.  Cough meds prn.  Get BNP to r/o CHF but pt w/o signs of volume overload.  Cautioned pt that if breathing worsens or she develops other sxs she should go to ER for evaluation and possible admission.  Pt expressed understanding and is in agreement w/ plan.

## 2013-08-17 NOTE — Progress Notes (Signed)
   Subjective:    Patient ID: Jacqueline [name], female    DOB: [birth year], 80 y.o.   MRN: [id number]  HPI SOB- went to ER on 4/30 after a fall in the kitchen.  Had normal CXR.  Pt reports she started feeling poorly on 5/1.  Decreased appetite.  Wet cough.  No fevers.  + SOB, difficulty speaking in complete sentences w/o wheezing.  No sinus pain/pressure.  No edema or signs of volumes overload.  + nasal congestion.   Review of Systems For ROS see HPI     Objective:   Physical Exam  Vitals reviewed. Constitutional: She is oriented to person, place, and time. She appears distressed (obvious SOB).  Pail, frail elderly woman ambulating w/ cane  HENT:  Head: Normocephalic and atraumatic.  Nose: Nose normal.  Mouth/Throat: Oropharynx is clear and moist. No oropharyngeal exudate.  TMs WNL bilaterally  Neck: Neck supple. No thyromegaly present.  Cardiovascular: Normal rate and regular rhythm.   Murmur (II/VI SEM at USB) heard. Reg S1/S2  Pulmonary/Chest: She is in respiratory distress (difficulty speaking in complete sentences).  Increased WOB Crackles 1/2 way up on R, L CTA Wet, nonproductive cough  Musculoskeletal: She exhibits no edema.  Lymphadenopathy:    She has no cervical adenopathy.  Neurological: She is alert and oriented to person, place, and time. No cranial nerve deficit. Coordination (antalgic gait, ambulating w/ cane) abnormal.          Assessment & Plan:

## 2013-08-22 ENCOUNTER — Encounter: Payer: Self-pay | Admitting: Family Medicine

## 2013-08-22 ENCOUNTER — Ambulatory Visit (INDEPENDENT_AMBULATORY_CARE_PROVIDER_SITE_OTHER): Payer: Commercial Managed Care - HMO | Admitting: Family Medicine

## 2013-08-22 VITALS — BP 122/72 | HR 59 | Temp 98.0°F | Resp 16 | Wt 108.5 lb

## 2013-08-22 DIAGNOSIS — R0602 Shortness of breath: Secondary | ICD-10-CM

## 2013-08-22 NOTE — Progress Notes (Signed)
Pre visit review using our clinic review tool, if applicable. No additional management support is needed unless otherwise documented below in the visit note. 

## 2013-08-22 NOTE — Assessment & Plan Note (Signed)
Pt's SOB and cough are much improved.  Finish abx as directed.  Continue daily allergy medication.  Reviewed supportive care and red flags that should prompt return.  Pt expressed understanding and is in agreement w/ plan.

## 2013-08-22 NOTE — Patient Instructions (Signed)
Schedule your complete physical in 3-4 months I'm SO glad that you're feeling better! Continue the allergy medication daily Call with any questions or concerns Happy [place] Mother's Day!

## 2013-08-22 NOTE — Progress Notes (Signed)
   Subjective:    Patient ID: Jacqueline [name], female    DOB: 09-Jan-1929, 80 y.o.   MRN: [id number]  HPI SOB- pt seen on 5/7 and started on Augmentin for presumed PNA.  No longer having wheezing or SOB unless heavily exerting herself.  Continues to cough but not nearly as severe as previous.  No fevers.  Using Flonase and Claritin.   Review of Systems For ROS see HPI     Objective:   Physical Exam  Vitals reviewed. Constitutional: She appears well-developed and well-nourished. No distress.  HENT:  Head: Normocephalic and atraumatic.  Right Ear: Tympanic membrane normal.  Left Ear: Tympanic membrane normal.  Nose: Mucosal edema and rhinorrhea present. Right sinus exhibits no maxillary sinus tenderness and no frontal sinus tenderness. Left sinus exhibits no maxillary sinus tenderness and no frontal sinus tenderness.  Mouth/Throat: Mucous membranes are normal. Posterior oropharyngeal erythema (w/ PND) present.  Eyes: Conjunctivae and EOM are normal. Pupils are equal, round, and reactive to light.  Neck: Normal range of motion. Neck supple.  Cardiovascular: Normal rate, regular rhythm and normal heart sounds.   Pulmonary/Chest: Effort normal and breath sounds normal. No respiratory distress. She has no wheezes. She has no rales.  Lymphadenopathy:    She has no cervical adenopathy.          Assessment & Plan:

## 2013-08-28 ENCOUNTER — Other Ambulatory Visit: Payer: Self-pay | Admitting: General Practice

## 2013-08-28 ENCOUNTER — Telehealth: Payer: Self-pay | Admitting: Family Medicine

## 2013-08-28 NOTE — Telephone Encounter (Signed)
Medication removed from pt med list.

## 2013-08-28 NOTE — Telephone Encounter (Signed)
Caller name: Katherina RightJacqueline Gilkison Relation to VW:[id number]:patient Call back number: [phone] Pharmacy:   Reason for call:  Patient called stating that she does not take bisacodyl (DULCOLAX) 5 MG EC tablet and would like for us to remove it from her med list. Please advise.

## 2013-09-11 ENCOUNTER — Ambulatory Visit (INDEPENDENT_AMBULATORY_CARE_PROVIDER_SITE_OTHER): Payer: Commercial Managed Care - HMO | Admitting: Cardiology

## 2013-09-11 ENCOUNTER — Encounter: Payer: Self-pay | Admitting: Cardiology

## 2013-09-11 VITALS — BP 160/80 | HR 52 | Ht 66.0 in | Wt 109.0 lb

## 2013-09-11 DIAGNOSIS — I10 Essential (primary) hypertension: Secondary | ICD-10-CM

## 2013-09-11 DIAGNOSIS — I359 Nonrheumatic aortic valve disorder, unspecified: Secondary | ICD-10-CM

## 2013-09-11 DIAGNOSIS — I350 Nonrheumatic aortic (valve) stenosis: Secondary | ICD-10-CM

## 2013-09-11 DIAGNOSIS — I2510 Atherosclerotic heart disease of native coronary artery without angina pectoris: Secondary | ICD-10-CM

## 2013-09-11 MED ORDER — LISINOPRIL 20 MG PO TABS: 20.0000 mg | ORAL_TABLET | Freq: Every day | ORAL | Status: DC

## 2013-09-11 NOTE — Assessment & Plan Note (Signed)
Continue aspirin and statin. 

## 2013-09-11 NOTE — Patient Instructions (Signed)
Your physician wants you to follow-up in: ONE YEAR WITH DR Shelda Pal will receive a reminder letter in the mail two months in advance. If you don't receive a letter, please call our office to schedule the follow-up appointment.   INCREASE LISINOPRIL TO 20 MG ONCE DAILY  Your physician recommends that you return for LAB WORK IN ONE WEEK

## 2013-09-11 NOTE — Assessment & Plan Note (Signed)
Patient apparently with history of atrial fibrillation and SVT. Continue amiodarone. Continue aspirin. I would be hesitant to anticoagulate given frail body habitus and history of mild dementia. Also with history of fall.

## 2013-09-11 NOTE — Assessment & Plan Note (Signed)
Blood pressure elevated. Increase lisinopril to 20 mg daily. Check potassium and renal function in one week. 

## 2013-09-11 NOTE — Assessment & Plan Note (Signed)
Followup echocardiogram when she returns in one year.

## 2013-09-11 NOTE — Progress Notes (Signed)
HPI: Fu CAD. Patient apparently on amiodarone in the past for paroxysmal atrial fibrillation. Also with SVT. Echocardiogram in May 2014 showed normal LV function, mild aortic stenosis with a mean gradient of 11 mm of mercury, and mild LAE. Carotid Dopplers in May of 2014 showed no significant stenosis. Cardiac cath in May of 2014 showed 3 vessel CAD. She subsequently underwent CABG x 5 per Dr. Laneta SimmersBartle with LIMA to LAD, SVG to DX, SVG to 1st and 2nd OM, SVG to RCA. Since she was last seen in April 2015, treated for bronchitis. She notes some dyspnea on exertion but no orthopnea, PND, pedal edema, chest pain or syncope.  Current Outpatient Prescriptions  Medication Sig Dispense Refill  . amiodarone (PACERONE) 200 MG tablet Take 200 mg by mouth daily.      Marland Kitchen. aspirin EC 325 MG tablet Take 325 mg by mouth every evening.       . desloratadine (CLARINEX) 5 MG tablet Take 1 tablet (5 mg total) by mouth daily.  15 tablet  0  . donepezil (ARICEPT) 10 MG tablet Take 10 mg by mouth at bedtime.      . fluticasone (FLONASE) 50 MCG/ACT nasal spray Place 1 spray into both nostrils daily.  16 g  0  . folic acid (FOLVITE) 1 MG tablet Take 1 mg by mouth daily.      Marland Kitchen. lisinopril (PRINIVIL,ZESTRIL) 10 MG tablet Take 10 mg by mouth daily.      . metoprolol tartrate (LOPRESSOR) 25 MG tablet Take 12.5 mg by mouth 2 (two) times daily.      . Multiple Vitamin (MULTIVITAMIN) tablet Take 1 tablet by mouth daily.      Marland Kitchen. omeprazole (PRILOSEC) 20 MG capsule Take 20 mg by mouth daily.       No current facility-administered medications for this visit.     Past Medical History  Diagnosis Date  . Diverticulitis   . Hypertension   . Osteoporosis   . CAD (coronary artery disease)     Past Surgical History  Procedure Laterality Date  . Fracture surgery    . Partial colectomy    . Coronary artery bypass graft N/A 08/25/2012    Procedure: CORONARY ARTERY BYPASS GRAFTING ;  Surgeon: Alleen BorneBryan K Bartle, MD;  Location: [facility]  OR;  Service: Open Heart Surgery;  Laterality: N/A;  four bypasses total     History   Social History  . Marital Status: Widowed    Spouse Name: N/A    Number of Children: 2  . Years of Education: 13+   Occupational History  . Retired    Social History Main Topics  . Smoking status: Former Smoker    Types: Cigarettes  . Smokeless tobacco: Never Used  . Alcohol Use: No     Comment: Occasional  . Drug Use: No  . Sexual Activity: No   Other Topics Concern  . Not on file   Social History Narrative   Regular exercise-no   Caffeine Use-yes    ROS: no fevers or chills, productive cough, hemoptysis, dysphasia, odynophagia, melena, hematochezia, dysuria, hematuria, rash, seizure activity, orthopnea, PND, pedal edema, claudication. Remaining systems are negative.  Physical Exam: Well-developed well-nourished in no acute distress.  Skin is warm and dry.  HEENT is normal.  Neck is supple.  Chest is clear to auscultation with normal expansion.  Cardiovascular exam is regular rate and rhythm. 2/6 systolic murmur left sternal border. Abdominal exam nontender or distended. No masses palpated.  Extremities show no edema. neuro grossly intact

## 2013-09-18 ENCOUNTER — Other Ambulatory Visit: Payer: Self-pay | Admitting: Family Medicine

## 2013-09-18 NOTE — Telephone Encounter (Signed)
Med filled and letter mailed to pt.  

## 2013-09-19 ENCOUNTER — Other Ambulatory Visit (INDEPENDENT_AMBULATORY_CARE_PROVIDER_SITE_OTHER): Payer: Commercial Managed Care - HMO

## 2013-09-19 DIAGNOSIS — I359 Nonrheumatic aortic valve disorder, unspecified: Secondary | ICD-10-CM

## 2013-09-19 DIAGNOSIS — I350 Nonrheumatic aortic (valve) stenosis: Secondary | ICD-10-CM

## 2013-09-19 DIAGNOSIS — I2510 Atherosclerotic heart disease of native coronary artery without angina pectoris: Secondary | ICD-10-CM

## 2013-09-19 DIAGNOSIS — I10 Essential (primary) hypertension: Secondary | ICD-10-CM

## 2013-09-19 LAB — BASIC METABOLIC PANEL
BUN: 15 mg/dL (ref 6–23)
CALCIUM: 9 mg/dL (ref 8.4–10.5)
CO2: 26 mEq/L (ref 19–32)
CREATININE: 0.7 mg/dL (ref 0.4–1.2)
Chloride: 104 mEq/L (ref 96–112)
GFR: 87.86 mL/min (ref >60.00)
GLUCOSE: 90 mg/dL (ref 70–99)
Potassium: 3.9 mEq/L (ref 3.5–5.1)
SODIUM: 138 meq/L (ref 135–145)

## 2013-09-25 ENCOUNTER — Other Ambulatory Visit: Payer: Self-pay | Admitting: Family Medicine

## 2013-09-25 NOTE — Telephone Encounter (Signed)
Med filled.  

## 2013-09-26 ENCOUNTER — Other Ambulatory Visit: Payer: Self-pay | Admitting: Family Medicine

## 2013-09-26 NOTE — Telephone Encounter (Signed)
Med filled.  

## 2013-10-05 ENCOUNTER — Ambulatory Visit (INDEPENDENT_AMBULATORY_CARE_PROVIDER_SITE_OTHER): Payer: Commercial Managed Care - HMO | Admitting: Family Medicine

## 2013-10-05 ENCOUNTER — Encounter: Payer: Self-pay | Admitting: Family Medicine

## 2013-10-05 VITALS — BP 110/80 | HR 55 | Temp 97.6°F | Wt 107.6 lb

## 2013-10-05 DIAGNOSIS — I10 Essential (primary) hypertension: Secondary | ICD-10-CM

## 2013-10-05 DIAGNOSIS — E46 Unspecified protein-calorie malnutrition: Secondary | ICD-10-CM | POA: Insufficient documentation

## 2013-10-05 DIAGNOSIS — R634 Abnormal weight loss: Secondary | ICD-10-CM

## 2013-10-05 DIAGNOSIS — J309 Allergic rhinitis, unspecified: Secondary | ICD-10-CM | POA: Insufficient documentation

## 2013-10-05 DIAGNOSIS — J301 Allergic rhinitis due to pollen: Secondary | ICD-10-CM

## 2013-10-05 MED ORDER — FLUTICASONE PROPIONATE 50 MCG/ACT NA SUSP: 2.0000 | Freq: Every day | NASAL | Status: DC

## 2013-10-05 NOTE — Assessment & Plan Note (Signed)
Chronic problem.  Pt ran out of flonase and sxs recurred.  Refill provided.

## 2013-10-05 NOTE — Patient Instructions (Signed)
Schedule your complete physical in 3-4 months (30 min visit) Restart the Flonase daily Make sure that as you are grazing throughout the day that you are getting enough protein in your diet- greek yogurt, string cheese, eggs, peanut butter, protein shakes (Ensure, Special K, etc) Call with any questions or concerns Have a great summer!

## 2013-10-05 NOTE — Assessment & Plan Note (Signed)
Chronic problem.  Better BP control since Dr Jens Somrenshaw increased Lisinopril to 20mg  daily.  Will continue to follow.

## 2013-10-05 NOTE — Progress Notes (Signed)
   Subjective:    Patient ID: Jacqueline [name], female    DOB: 1928-09-23, 80 y.o.   MRN: [id number]  HPI Allergic rhinitis- pt continue to have difficulty w/ nasal congestion and drainage.  Is out of Flonase.  No trouble w/ cough.  No eye irritation or drainage.  No SOB or wheezing.  HTN- chronic problem, on Metoprolol, Lisinopril.  Cardiology recently increased Lisinopril to 20mg .  No CP, SOB, HAs, visual changes.  Weight loss- pt is not eating meals, tends to graze throughout the day.  Limited protein intake.  Pt has lost nearly 10 lbs since 11/2012.     Review of Systems For ROS see HPI     Objective:   Physical Exam  Vitals reviewed. Constitutional: She is oriented to person, place, and time. She appears well-developed. No distress.  thin  HENT:  Head: Normocephalic and atraumatic.  Right Ear: Tympanic membrane normal.  Left Ear: Tympanic membrane normal.  Nose: Mucosal edema and rhinorrhea present. Right sinus exhibits no maxillary sinus tenderness and no frontal sinus tenderness. Left sinus exhibits no maxillary sinus tenderness and no frontal sinus tenderness.  Mouth/Throat: Mucous membranes are normal. Posterior oropharyngeal erythema (w/ PND) present.  Eyes: Conjunctivae and EOM are normal. Pupils are equal, round, and reactive to light.  Neck: Normal range of motion. Neck supple.  Cardiovascular: Normal rate and regular rhythm.   Murmur (II/VI SEM over LUSB) heard. Pulmonary/Chest: Effort normal and breath sounds normal. No respiratory distress. She has no wheezes. She has no rales.  Musculoskeletal: She exhibits no edema.  Lymphadenopathy:    She has no cervical adenopathy.  Neurological: She is alert and oriented to person, place, and time. No cranial nerve deficit. Coordination normal.  Skin: Skin is warm and dry.  Psychiatric: She has a normal mood and affect. Her behavior is normal. Thought content normal.          Assessment & Plan:

## 2013-10-05 NOTE — Assessment & Plan Note (Signed)
New.  Reviewed pt's weight loss and eating patterns w/ pt and family.  Discussed need to incorporate protein into diet daily.  Pt and family in agreement.  Will follow.

## 2013-10-05 NOTE — Assessment & Plan Note (Signed)
Pt has lost ~10 lbs in 10 months.  Pt reports she is eating but not meals, will graze throughout the day.  Pt's grazing is primarily carbohydrates and very limited protein.  Reviewed the need for pt to have readily available protein in the form of cheese, shakes, greek yogurt, etc.  Family in agreement and will make sure she has this available.  Will follow.

## 2013-10-05 NOTE — Progress Notes (Signed)
Pre visit review using our clinic review tool, if applicable. No additional management support is needed unless otherwise documented below in the visit note. 

## 2013-10-13 ENCOUNTER — Other Ambulatory Visit: Payer: Self-pay | Admitting: Family Medicine

## 2013-10-13 DIAGNOSIS — G309 Alzheimer's disease, unspecified: Principal | ICD-10-CM

## 2013-10-13 DIAGNOSIS — F0280 Dementia in other diseases classified elsewhere without behavioral disturbance: Secondary | ICD-10-CM

## 2013-10-16 NOTE — Telephone Encounter (Signed)
[facility] [place]

## 2013-11-21 ENCOUNTER — Telehealth: Payer: Self-pay

## 2013-11-21 NOTE — Telephone Encounter (Signed)
Attempted to call patient with no answer and no voicemail.

## 2013-11-21 NOTE — Telephone Encounter (Signed)
Message copied by Jessee Avers on Tue Nov 21, 2013 11:26 AM ------      Message from: Jessee Avers      Created: Mon Nov 20, 2013  8:16 AM                   ----- Message -----         From: Jessee Avers, CMA         Sent: 11/20/2013           To: Jessee Avers, CMA            Need to schedule yearly pancreatic MRI f/u.                  Meryl Dare, MD More Detail >>                     Meryl Dare, MD                     Sent: Caleen Essex December 16, 2012  1:21 PM         To: Jessee Avers, CMA         Cc: Rachael Fee, MD                            [name]         MRN: [id number] DOB: [birth year]               Pt Home: [phone]                                             Message         [name],                    Please inform the patient and family of Dr. Christella Hartigan' recommendation and schedule pancreatic MRI for 1 year from her recent MRI. Thanks.                   MS                   ----- Message -----            From: Rachael Fee, MD            Sent: 12/16/2012   1:13 PM              To: Meryl Dare, MD                   Judie Petit,         A newer AGA algorithm recommends follow up MRI in 1 year (for cysts < 3cm without any concerning features (dilated main PD or associated solid nodules).  I'm starting to adopt this more and more, certainly at age 9 it is a reasonable plan.                   I'll get you a copy of the algorithm.                   dj                                       -----  Message -----            From: Meryl DareMalcolm T Stark, MD            Sent: 12/16/2012   1:07 PM              To: Rachael Feeaniel P Jacobs, MD                   Jesusita Okaan, Would you please review my office note from today and her recent abdominal MRI showing a 1.7 cm pancreatic tail cystic lesion for consideration of EUS. Thanks. Judie PetitMalcolm        ------

## 2013-11-24 NOTE — Telephone Encounter (Addendum)
Attempted to call patient with no answer and no voicemail.

## 2013-11-27 ENCOUNTER — Other Ambulatory Visit: Payer: Self-pay | Admitting: Gastroenterology

## 2013-11-27 DIAGNOSIS — R933 Abnormal findings on diagnostic imaging of other parts of digestive tract: Secondary | ICD-10-CM

## 2013-11-27 NOTE — Telephone Encounter (Signed)
Scheduled patient for 12/06/13 at 8:00am arriving at 7:45am at [facility]. Informed patient of date and time of appt and nothing to eat or drink after midnight. Pt agreed and verbalized understanding.

## 2013-12-06 ENCOUNTER — Ambulatory Visit (HOSPITAL_COMMUNITY)
Admission: RE | Admit: 2013-12-06 | Discharge: 2013-12-06 | Disposition: A | Discharge status: Home / Self Care | Payer: Medicare HMO | Source: Ambulatory Visit | Attending: Gastroenterology | Admitting: Gastroenterology

## 2013-12-06 DIAGNOSIS — K862 Cyst of pancreas: Secondary | ICD-10-CM | POA: Insufficient documentation

## 2013-12-06 DIAGNOSIS — Q619 Cystic kidney disease, unspecified: Secondary | ICD-10-CM | POA: Insufficient documentation

## 2013-12-06 DIAGNOSIS — R933 Abnormal findings on diagnostic imaging of other parts of digestive tract: Secondary | ICD-10-CM | POA: Diagnosis present

## 2013-12-06 DIAGNOSIS — K863 Pseudocyst of pancreas: Secondary | ICD-10-CM | POA: Diagnosis not present

## 2013-12-06 LAB — POCT I-STAT CREATININE: Creatinine, Ser: 0.9 mg/dL (ref 0.50–1.10)

## 2013-12-06 MED ORDER — GADOBENATE DIMEGLUMINE 529 MG/ML IV SOLN
9.0000 mL | Freq: Once | INTRAVENOUS | Status: AC | PRN
  Administered 2013-12-06: 9 mL via INTRAVENOUS

## 2013-12-08 ENCOUNTER — Telehealth: Payer: Self-pay | Admitting: Gastroenterology

## 2013-12-08 ENCOUNTER — Other Ambulatory Visit: Payer: Self-pay | Admitting: Family Medicine

## 2013-12-08 NOTE — Telephone Encounter (Signed)
All questions answered about MRI results.  See results for additional details

## 2013-12-08 NOTE — Telephone Encounter (Signed)
Med filled.  

## 2013-12-18 ENCOUNTER — Other Ambulatory Visit: Payer: Self-pay | Admitting: Family Medicine

## 2013-12-20 ENCOUNTER — Other Ambulatory Visit: Payer: Self-pay | Admitting: Family Medicine

## 2013-12-20 NOTE — Telephone Encounter (Signed)
Med filled.  

## 2013-12-25 ENCOUNTER — Other Ambulatory Visit: Payer: Self-pay | Admitting: Family Medicine

## 2013-12-25 NOTE — Telephone Encounter (Signed)
Med filled.  

## 2014-01-02 ENCOUNTER — Encounter: Payer: Self-pay | Admitting: Neurology

## 2014-01-02 ENCOUNTER — Ambulatory Visit (INDEPENDENT_AMBULATORY_CARE_PROVIDER_SITE_OTHER): Payer: Commercial Managed Care - HMO | Admitting: Neurology

## 2014-01-02 VITALS — BP 128/72 | HR 78 | Resp 16 | Wt 109.0 lb

## 2014-01-02 DIAGNOSIS — I679 Cerebrovascular disease, unspecified: Secondary | ICD-10-CM

## 2014-01-02 DIAGNOSIS — F0150 Vascular dementia without behavioral disturbance: Secondary | ICD-10-CM

## 2014-01-02 DIAGNOSIS — F0390 Unspecified dementia without behavioral disturbance: Secondary | ICD-10-CM

## 2014-01-02 DIAGNOSIS — F0280 Dementia in other diseases classified elsewhere without behavioral disturbance: Secondary | ICD-10-CM

## 2014-01-02 MED ORDER — RIVASTIGMINE TARTRATE 1.5 MG PO CAPS: 1.5000 mg | ORAL_CAPSULE | Freq: Two times a day (BID) | ORAL | Status: DC

## 2014-01-02 NOTE — Patient Instructions (Signed)
1.  Stop Aricept. 2.  Instead, start rivastigmine 1.5mg  capsules, take 1 capsule twice daily.  Call in 4 weeks with update and for refill. 3.  Follow up in 6 months.

## 2014-01-02 NOTE — Progress Notes (Signed)
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  She is accompanied by her daughter-in-law.    UPDATE: She was started on Aricept but continues to have vivid dreams.  There hasn't been much change since last visit.  Memory is unchanged.  Sometimes, her actions don't make sense.  For example, she found a used Kleenex in the container of [employer].  She took the Kleenex out and threw it away.  However, she then proceeded to eat the [employer].  No change in personality or behavior.  She is very unsteady on her feet.  She requires use of a walker.    HISTORY: She underwent coronary artery bypass graft x 5 in May 2014.  She began to have symptoms prior, but has gotten much worse since the procedure.  Her family first started noticing symptoms about two years ago.  She was forgetting how to perform certain tasks, such as using the TV remote.  She often repeats questions.  She needs help administering her medications.  She also says that she has difficulty figuring out what she wants to say.  She has strong history of anxiety and insomnia.  She has taken Ambien in the past but it made her sick.  She will often fall asleep easily during the day but is easily awoken.  She is able to perform daily activities such as bathing and dressing.  She is able to cook and use the stove, but only able to use the stove when somebody else is in the house.  She no longer drives.  There is no noticeable difficulty recalling names or recognizing people.  She has not had any change in behavior or personality.  She is sometimes irritable, but not combative. No hallucinations or delusions.  She has gait issues since a right hip replacement several years ago.  She has a shuffling gait and sometimes trips over things.  There are no  abnormal movements noted.  No known family history of dementia.  She usually sits alone at home.  She avoids social situations due to anxiety.  She worries a lot, particularly with new situations.  She did not get any sleep last night in anticipation for today's visit.  She feels a little sad at times, but not tearful.  No behavioral or personality changes.  01/12/13 MRI Brain wo reviewed:  moderate atrophy and advanced chronic microvascular ischemic changes. 11/21/12: TSH 1.74, B12 494, folate >24.8 08/22/12 Carotid doppler:  no significant extracranial carotid artery stenosis. 08/21/12 2D Echo:  LVEF 60-65%, no atrial septum defect or PFO 04/28/12 LDL 115  MOCA score 06/29/13 was 21/30 (involving some memory, visuospatial and executive dysfunction)  PAST MEDICAL HISTORY: Past Medical History  Diagnosis Date  . Diverticulitis   . Hypertension   . Osteoporosis   . CAD (coronary artery disease)     MEDICATIONS: Current Outpatient Prescriptions on File Prior to Visit  Medication Sig Dispense Refill  . alendronate (FOSAMAX) 70 MG tablet TAKE 1 TABLET BY MOUTH EVERY 7 DAYS. TAKE WITH A FULL GLASS OF WATER ON AN EMPTY STOMACH  12 tablet  0  . amiodarone (PACERONE) 200 MG tablet Take 200 mg by mouth daily.      Marland Kitchen aspirin EC 325 MG tablet Take 325 mg by mouth every evening.       Marland Kitchen  atorvastatin (LIPITOR) 40 MG tablet TAKE 1 TABLET BY MOUTH DAILY AT 6 PM  90 tablet  0  . desloratadine (CLARINEX) 5 MG tablet Take 1 tablet (5 mg total) by mouth daily.  15 tablet  0  . fluticasone (FLONASE) 50 MCG/ACT nasal spray Place 2 sprays into both nostrils daily.  16 g  6  . folic acid (FOLVITE) 1 MG tablet TAKE 1 TABLET BY MOUTH EVERY DAY  90 tablet  0  . lisinopril (PRINIVIL,ZESTRIL) 20 MG tablet Take 1 tablet (20 mg total) by mouth daily.  30 tablet  12  . metoprolol tartrate (LOPRESSOR) 25 MG tablet TAKE 1/2 TABLET BY MOUTH TWICE A DAY  90 tablet  0  . Multiple Vitamin (MULTIVITAMIN) tablet Take 1 tablet by  mouth daily.      Marland Kitchen omeprazole (PRILOSEC) 20 MG capsule Take 20 mg by mouth daily.       No current facility-administered medications on file prior to visit.    ALLERGIES: No Known Allergies  FAMILY HISTORY: Family History  Problem Relation Age of Onset  . Hypertension Other   . Cancer Mother     stomach  . Diabetes Neg Hx   . Heart disease Neg Hx   . Stroke Neg Hx     SOCIAL HISTORY: History   Social History  . Marital Status: Widowed    Spouse Name: N/A    Number of Children: 2  . Years of Education: 13+   Occupational History  . Retired    Social History Main Topics  . Smoking status: Former Smoker    Types: Cigarettes  . Smokeless tobacco: Never Used  . Alcohol Use: No     Comment: Occasional  . Drug Use: No  . Sexual Activity: No   Other Topics Concern  . Not on file   Social History Narrative   Regular exercise-no   Caffeine Use-yes    REVIEW OF SYSTEMS: Constitutional: No fevers, chills, or sweats, no generalized fatigue, change in appetite Eyes: No visual changes, double vision, eye pain Ear, nose and throat: No hearing loss, ear pain, nasal congestion, sore throat Cardiovascular: No chest pain, palpitations Respiratory:  No shortness of breath at rest or with exertion, wheezes GastrointestinaI: No nausea, vomiting, diarrhea, abdominal pain, fecal incontinence Genitourinary:  No dysuria, urinary retention or frequency Musculoskeletal:  No neck pain, back pain Integumentary: No rash, pruritus, skin lesions Neurological: as above Psychiatric: No depression, insomnia, anxiety Endocrine: No palpitations, fatigue, diaphoresis, mood swings, change in appetite, change in weight, increased thirst Hematologic/Lymphatic:  No anemia, purpura, petechiae. Allergic/Immunologic: no itchy/runny eyes, nasal congestion, recent allergic reactions, rashes  PHYSICAL EXAM: Filed Vitals:   01/02/14 0950  BP: 128/72  Pulse: 78  Resp: 16   General: No acute  distress Head:  Normocephalic/atraumatic Neck: supple, no paraspinal tenderness, full range of motion Heart:  Regular rate and rhythm Lungs:  Clear to auscultation bilaterally Back: No paraspinal tenderness Neurological Exam: alert and oriented to person, place, and time. Attention span and concentration intact, delayed recall poor, remote memory intact, fund of knowledge intact.   Montreal Cognitive Assessment  01/02/2014  Visuospatial/ Executive (0/5) 1  Naming (0/3) 3  Attention: Read list of digits (0/2) 2  Attention: Read list of letters (0/1) 1  Attention: Serial 7 subtraction starting at 100 (0/3) 3  Language: Repeat phrase (0/2) 2  Language : Fluency (0/1) 1  Abstraction (0/2) 2  Delayed Recall (0/5) 0  Orientation (0/6) 6  Total 21  Adjusted Score (based on education) 21   Speech fluent and not dysarthric, language intact.  CN II-XII intact. Fundi not visualized.  Bulk and tone normal, muscle strength 5/5 throughout.  Deep tendon reflexes 2+ throughout, toes downgoing.  Finger to nose intact.  Gait cautious and unsteady with short stride.  IMPRESSION: Mult-domain dementia.  I suspect possible early Alzheimer's with possible vascular component (thus involving visuospatial and excecutive functioning).  PLAN: 1.  We will discontinue Aricept and instead start rivastigmine 1.5mg  twice daily.   2.  Continue ASA and Statin. Will recheck fasting lipid panel (LDL goal should be less than 100).  3. Mediterranean diet.  4. Walker for ambulation 5.  Follow up in 6 months.   Shon Millet, DO  CC:  [name], MD

## 2014-01-25 ENCOUNTER — Other Ambulatory Visit: Payer: Self-pay | Admitting: General Practice

## 2014-01-25 MED ORDER — AMIODARONE HCL 200 MG PO TABS: ORAL_TABLET | ORAL | Status: DC

## 2014-01-30 ENCOUNTER — Telehealth: Payer: Self-pay | Admitting: Neurology

## 2014-01-30 NOTE — Telephone Encounter (Signed)
Patient calling with update on Exelon she states it is really causing mood swings she does not like please advise

## 2014-01-30 NOTE — Telephone Encounter (Signed)
She can stop Exelon.  Instead, we can try galantamine.  It is another medication which may be better tolerated than Exelon.    Start 4mg  twice daily for 7 days,  then 4mg  in morning and 8mg  at bedtime for 7 days,  then 8mg  twice daily

## 2014-01-30 NOTE — Telephone Encounter (Signed)
Pt wants to talk to someone about medication please call [phone]

## 2014-01-31 ENCOUNTER — Telehealth: Payer: Self-pay | Admitting: *Deleted

## 2014-01-31 NOTE — Telephone Encounter (Signed)
She can stop Exelon.  Instead, we can try galantamine.  It is another medication which may be better tolerated than Exelon.     Start 4mg  twice daily for 7 days,   then 4mg  in morning and 8mg  at bedtime for 7 days,   then 8mg  twice daily   patient is aware

## 2014-02-01 ENCOUNTER — Telehealth: Payer: Self-pay | Admitting: Neurology

## 2014-02-01 NOTE — Telephone Encounter (Signed)
Pt called stating that she could not pay for the meds that Dr. Everlena CooperJaffe wanted her to have for her MS due to the cost. Pt wants to know if there is a generic kind. C/B [phone]

## 2014-02-02 NOTE — Telephone Encounter (Signed)
Patient called stating that her galantamine was a bit costly she is not sure she can afford it but she will wait and see if she can when it comes she hopes it helps

## 2014-02-05 ENCOUNTER — Ambulatory Visit (INDEPENDENT_AMBULATORY_CARE_PROVIDER_SITE_OTHER): Payer: Commercial Managed Care - HMO | Admitting: Family Medicine

## 2014-02-05 ENCOUNTER — Encounter: Payer: Self-pay | Admitting: General Practice

## 2014-02-05 ENCOUNTER — Encounter: Payer: Self-pay | Admitting: Family Medicine

## 2014-02-05 ENCOUNTER — Telehealth: Payer: Self-pay | Admitting: Neurology

## 2014-02-05 VITALS — BP 122/80 | HR 57 | Temp 97.9°F | Resp 16 | Ht 64.0 in | Wt 108.2 lb

## 2014-02-05 DIAGNOSIS — E785 Hyperlipidemia, unspecified: Secondary | ICD-10-CM | POA: Insufficient documentation

## 2014-02-05 DIAGNOSIS — Z23 Encounter for immunization: Secondary | ICD-10-CM

## 2014-02-05 DIAGNOSIS — I10 Essential (primary) hypertension: Secondary | ICD-10-CM

## 2014-02-05 DIAGNOSIS — M8580 Other specified disorders of bone density and structure, unspecified site: Secondary | ICD-10-CM

## 2014-02-05 DIAGNOSIS — Z0000 Encounter for general adult medical examination without abnormal findings: Secondary | ICD-10-CM

## 2014-02-05 DIAGNOSIS — I480 Paroxysmal atrial fibrillation: Secondary | ICD-10-CM

## 2014-02-05 DIAGNOSIS — Z1231 Encounter for screening mammogram for malignant neoplasm of breast: Secondary | ICD-10-CM

## 2014-02-05 DIAGNOSIS — M810 Age-related osteoporosis without current pathological fracture: Secondary | ICD-10-CM

## 2014-02-05 DIAGNOSIS — I25719 Atherosclerosis of autologous vein coronary artery bypass graft(s) with unspecified angina pectoris: Secondary | ICD-10-CM

## 2014-02-05 LAB — CBC WITH DIFFERENTIAL/PLATELET
Basophils Absolute: 0 10*3/uL (ref 0.0–0.1)
Basophils Relative: 0.2 % (ref 0.0–3.0)
EOS ABS: 0 10*3/uL (ref 0.0–0.7)
Eosinophils Relative: 0.7 % (ref 0.0–5.0)
HCT: 39.6 % (ref 36.0–46.0)
HEMOGLOBIN: 13.1 g/dL (ref 12.0–15.0)
LYMPHS PCT: 17 % (ref 12.0–46.0)
Lymphs Abs: 1.1 10*3/uL (ref 0.7–4.0)
MCHC: 33.1 g/dL (ref 30.0–36.0)
MCV: 99.8 fl (ref 78.0–100.0)
MONOS PCT: 6.9 % (ref 3.0–12.0)
Monocytes Absolute: 0.5 10*3/uL (ref 0.1–1.0)
NEUTROS PCT: 75.2 % (ref 43.0–77.0)
Neutro Abs: 5 10*3/uL (ref 1.4–7.7)
Platelets: 257 10*3/uL (ref 150.0–400.0)
RBC: 3.97 Mil/uL (ref 3.87–5.11)
RDW: 15.7 % — ABNORMAL HIGH (ref 11.5–15.5)
WBC: 6.7 10*3/uL (ref 4.0–10.5)

## 2014-02-05 LAB — LIPID PANEL
CHOLESTEROL: 123 mg/dL (ref 0–200)
HDL: 60.2 mg/dL (ref >39.00)
LDL Cholesterol: 47 mg/dL (ref 0–99)
NONHDL: 62.8
Total CHOL/HDL Ratio: 2
Triglycerides: 78 mg/dL (ref 0.0–149.0)
VLDL: 15.6 mg/dL (ref 0.0–40.0)

## 2014-02-05 LAB — BASIC METABOLIC PANEL
BUN: 14 mg/dL (ref 6–23)
CO2: 29 meq/L (ref 19–32)
CREATININE: 0.9 mg/dL (ref 0.4–1.2)
Calcium: 9.5 mg/dL (ref 8.4–10.5)
Chloride: 101 mEq/L (ref 96–112)
GFR: 66.05 mL/min (ref >60.00)
GLUCOSE: 96 mg/dL (ref 70–99)
Potassium: 4 mEq/L (ref 3.5–5.1)
Sodium: 137 mEq/L (ref 135–145)

## 2014-02-05 LAB — HEPATIC FUNCTION PANEL
ALT: 34 U/L (ref 0–35)
AST: 37 U/L (ref 0–37)
Albumin: 3.6 g/dL (ref 3.5–5.2)
Alkaline Phosphatase: 67 U/L (ref 39–117)
Bilirubin, Direct: 0.1 mg/dL (ref 0.0–0.3)
TOTAL PROTEIN: 7.4 g/dL (ref 6.0–8.3)
Total Bilirubin: 0.9 mg/dL (ref 0.2–1.2)

## 2014-02-05 LAB — VITAMIN D 25 HYDROXY (VIT D DEFICIENCY, FRACTURES): VITD: 50.5 ng/mL (ref 30.00–100.00)

## 2014-02-05 LAB — TSH: TSH: 2.35 u[IU]/mL (ref 0.35–4.50)

## 2014-02-05 NOTE — Progress Notes (Signed)
Pre visit review using our clinic review tool, if applicable. No additional management support is needed unless otherwise documented below in the visit note. 

## 2014-02-05 NOTE — Telephone Encounter (Signed)
Patient is unable to afford galantamine  please advise

## 2014-02-05 NOTE — Patient Instructions (Signed)
Follow up in 6 months to recheck BP and cholesterol We'll notify you of your lab results and make any changes if needed We'll call you with your mammo and DEXA appts Keep up the good work!  You're doing better than you realize! Call with any questions or concerns Happy Fall!!!

## 2014-02-05 NOTE — Assessment & Plan Note (Signed)
Chronic problem.  Well controlled.  Asymptomatic.  Check labs.  No anticipated med changes. 

## 2014-02-05 NOTE — Addendum Note (Signed)
Addended by: Jackson LatinoYLER, Lunetta Marina L on: 02/05/2014 11:20 AM   Modules accepted: Orders

## 2014-02-05 NOTE — Assessment & Plan Note (Signed)
Chronic problem, s/p bypass surgery.  Following w/ cards.  On beta blocker, ACE, statin.  Currently asymptomatic.

## 2014-02-05 NOTE — Telephone Encounter (Signed)
Jacqueline [name] is calling back wanting to speak with someone please call [phone]

## 2014-02-05 NOTE — Assessment & Plan Note (Signed)
Chronic problem.  On Amiodarone.  Following w/ cards.  Not on anticoagulation.  Currently asymptomatic.

## 2014-02-05 NOTE — Progress Notes (Signed)
   Subjective:    Patient ID: Jacqueline [name], female    DOB: 06/18/1928, 80 y.o.   MRN: [id number]  HPI Here today for CPE.  Risk Factors: HTN- chronic problem, well controlled on Lisinopril, Metoprolol. CAD- chronic problem, s/p bypass surgery.  Following w/ cards. Hyperlipidemia- chronic problem, on Lipitor Osteoporosis- chronic problem, on Fosamax Afib- chronic problem, on Amiodarone.  Following w/ Dr Jens Somrenshaw Physical Activity: walking regularly Fall Risk: elevated due to use of cane Depression: denies Hearing: decreased to conversational tones and whispered voice at 6 ft ADL's: independent Cognitive: normal linear thought process, some memory loss Home Safety: feeling safe at home, lives w/ daughter.  Pt is no longer driving Height, Weight, BMI, Visual Acuity: see vitals, vision corrected to 20/20 w/ glasses Counseling: UTD on colonoscopy, due for DEXA and mammo. Labs Ordered: See A&P Care Plan: See A&P    Review of Systems Patient reports no vision/ hearing changes, adenopathy,fever, weight change,  persistant/recurrent hoarseness, chest pain, palpitations, edema, persistant/recurrent cough, hemoptysis, gastrointestinal bleeding (melena, rectal bleeding), abdominal pain, significant heartburn, bowel changes, GU symptoms (dysuria, hematuria, incontinence), Gyn symptoms (abnormal  bleeding, pain),  syncope, focal weakness, numbness & tingling, skin/hair/nail changes, abnormal bruising or bleeding, anxiety, or depression.   + dysphagia w/ both solids and liquids- pt not interested in GI referral at this time + SOB w/ exertion    Objective:   Physical Exam General Appearance:    Alert, cooperative, no distress, appears stated age  Head:    Normocephalic, without obvious abnormality, atraumatic  Eyes:    PERRL, conjunctiva/corneas clear, EOM's intact, fundi    benign, both eyes  Ears:    Normal TM's and external ear canals, both ears  Nose:   Nares normal, septum  midline, mucosa normal, no drainage    or sinus tenderness  Throat:   Lips, mucosa, and tongue normal; teeth and gums normal  Neck:   Supple, symmetrical, trachea midline, no adenopathy;    Thyroid: no enlargement/tenderness/nodules  Back:     Symmetric, no curvature, ROM normal, no CVA tenderness  Lungs:     Clear to auscultation bilaterally, respirations unlabored  Chest Wall:    No tenderness or deformity   Heart:    Regular rate and rhythm, S1 and S2 normal, II-III/VI SEM consistent w/ aortic stenosis, no rub or gallop  Breast Exam:    Deferred to mammo  Abdomen:     Soft, non-tender, bowel sounds active all four quadrants,    no masses, no organomegaly  Genitalia:    Deferred  Rectal:    Extremities:   Extremities normal, atraumatic, no cyanosis or edema  Pulses:   2+ and symmetric all extremities  Skin:   Skin color, texture, turgor normal, no rashes or lesions  Lymph nodes:   Cervical, supraclavicular, and axillary nodes normal  Neurologic:   CNII-XII intact, normal strength, sensation and reflexes    throughout          Assessment & Plan:

## 2014-02-05 NOTE — Assessment & Plan Note (Signed)
Ongoing issue.  Due for DEXA.  On Fosamax w/o difficulty.  Check Vit D level- replete prn.

## 2014-02-05 NOTE — Telephone Encounter (Signed)
Edwyna Perfectngela Stanger wants to talk to you about her mother in law medication please call [phone]

## 2014-02-05 NOTE — Assessment & Plan Note (Signed)
Chronic problem.  Tolerating statin w/o difficulty.  Check labs.  Adjust meds prn  

## 2014-02-05 NOTE — Assessment & Plan Note (Signed)
Pt's PE unchanged from previous- known AS mumur.  Pt UTD on colonoscopy.  Due for mammo and DEXA- orders entered.  Written screening schedule updated and given to pt.  Check labs.  Anticipatory guidance provided.

## 2014-02-06 ENCOUNTER — Telehealth: Payer: Self-pay | Admitting: *Deleted

## 2014-02-06 NOTE — Telephone Encounter (Signed)
Then we can try Namenda.  Start Namenda (memantine) 10mg  tablets.  Take 1/2 tablet at bedtime for 7 days, then 1/2 tablet twice daily for 7 days, then 1/2 tablet in morning and 1 tablet at bedtime for 7 days, then 1 tablet twice daily.   Side effects include dizziness, headache, diarrhea or constipation.  Call with any questions or concerns.

## 2014-02-08 ENCOUNTER — Other Ambulatory Visit: Payer: Self-pay | Admitting: Family Medicine

## 2014-02-08 DIAGNOSIS — E2839 Other primary ovarian failure: Secondary | ICD-10-CM

## 2014-02-08 DIAGNOSIS — N63 Unspecified lump in unspecified breast: Secondary | ICD-10-CM

## 2014-02-08 NOTE — Telephone Encounter (Signed)
-   new rx discussed with daughter and was called to [facility] namenda 10 mg direction was disculled with daughter and advised to call before running out with update on how patient tolerated before refills    By Pamalee LeydenSusan E Van Der Karl ItoGlas, LPN

## 2014-02-09 ENCOUNTER — Other Ambulatory Visit: Payer: Self-pay

## 2014-02-09 ENCOUNTER — Other Ambulatory Visit: Payer: Self-pay | Admitting: Family Medicine

## 2014-02-09 DIAGNOSIS — N63 Unspecified lump in unspecified breast: Secondary | ICD-10-CM

## 2014-02-27 ENCOUNTER — Other Ambulatory Visit: Payer: Self-pay | Admitting: General Practice

## 2014-02-27 MED ORDER — ALENDRONATE SODIUM 70 MG PO TABS: ORAL_TABLET | ORAL | Status: DC

## 2014-02-28 ENCOUNTER — Other Ambulatory Visit: Payer: Commercial Managed Care - HMO

## 2014-03-02 ENCOUNTER — Other Ambulatory Visit: Payer: Self-pay | Admitting: Neurology

## 2014-03-05 ENCOUNTER — Other Ambulatory Visit: Payer: Self-pay | Admitting: Neurology

## 2014-03-22 ENCOUNTER — Encounter (HOSPITAL_COMMUNITY): Payer: Self-pay | Admitting: Cardiology

## 2014-03-27 ENCOUNTER — Other Ambulatory Visit: Payer: Self-pay | Admitting: General Practice

## 2014-03-27 MED ORDER — METOPROLOL TARTRATE 25 MG PO TABS: 12.5000 mg | ORAL_TABLET | Freq: Two times a day (BID) | ORAL | Status: DC

## 2014-03-27 MED ORDER — OMEPRAZOLE 20 MG PO CPDR: 20.0000 mg | DELAYED_RELEASE_CAPSULE | Freq: Every day | ORAL | Status: DC

## 2014-03-27 MED ORDER — FOLIC ACID 1 MG PO TABS: 1.0000 mg | ORAL_TABLET | Freq: Every day | ORAL | Status: DC

## 2014-04-08 ENCOUNTER — Other Ambulatory Visit: Payer: Self-pay | Admitting: Neurology

## 2014-04-10 ENCOUNTER — Ambulatory Visit
Admission: RE | Admit: 2014-04-10 | Discharge: 2014-04-10 | Disposition: A | Discharge status: Home / Self Care | Payer: Commercial Managed Care - HMO | Source: Ambulatory Visit | Attending: Family Medicine | Admitting: Family Medicine

## 2014-04-10 DIAGNOSIS — E2839 Other primary ovarian failure: Secondary | ICD-10-CM

## 2014-04-10 DIAGNOSIS — N63 Unspecified lump in unspecified breast: Secondary | ICD-10-CM

## 2014-04-11 ENCOUNTER — Encounter: Payer: Self-pay | Admitting: General Practice

## 2014-04-24 ENCOUNTER — Telehealth: Payer: Self-pay | Admitting: Family Medicine

## 2014-04-24 NOTE — Telephone Encounter (Signed)
Caller name:Buehner, Nehemie Relation to OZ:HYQMpt:self Call back [phone] Pharmacy:  Reason for call: pt states she never received her letter with her results from her bone density. Would like for you to send her another letter in the mail

## 2014-04-25 NOTE — Telephone Encounter (Signed)
Called and gave pt results, also remailed the letter.

## 2014-05-01 ENCOUNTER — Telehealth: Payer: Self-pay | Admitting: Neurology

## 2014-05-01 ENCOUNTER — Other Ambulatory Visit: Payer: Self-pay | Admitting: *Deleted

## 2014-05-01 MED ORDER — MEMANTINE HCL 10 MG PO TABS: 10.0000 mg | ORAL_TABLET | Freq: Two times a day (BID) | ORAL | Status: DC

## 2014-05-01 NOTE — Telephone Encounter (Signed)
Patient Daughter Jacqueline [name] called and wants to talk to someone about mother medication please call [phone]

## 2014-05-11 ENCOUNTER — Telehealth: Payer: Self-pay | Admitting: Family Medicine

## 2014-05-11 MED ORDER — AMIODARONE HCL 200 MG PO TABS: ORAL_TABLET | ORAL | Status: DC

## 2014-05-11 NOTE — Telephone Encounter (Signed)
Caller name: Katherina Righturnbull, Feleshia A Relation to pt: self  Call back number: [phone] Pharmacy:  Reason for call:  Pt is completely out amiodarone (PACERONE) 200 MG tablet

## 2014-05-11 NOTE — Telephone Encounter (Signed)
Med filled.  

## 2014-05-23 NOTE — Telephone Encounter (Signed)
Error

## 2014-06-06 ENCOUNTER — Other Ambulatory Visit: Payer: Self-pay | Admitting: General Practice

## 2014-06-06 MED ORDER — ATORVASTATIN CALCIUM 40 MG PO TABS: ORAL_TABLET | ORAL | Status: DC

## 2014-06-20 ENCOUNTER — Other Ambulatory Visit: Payer: Self-pay | Admitting: General Practice

## 2014-06-20 MED ORDER — FOLIC ACID 1 MG PO TABS: 1.0000 mg | ORAL_TABLET | Freq: Every day | ORAL | Status: DC

## 2014-07-03 ENCOUNTER — Ambulatory Visit (INDEPENDENT_AMBULATORY_CARE_PROVIDER_SITE_OTHER): Payer: Commercial Managed Care - HMO | Admitting: Neurology

## 2014-07-03 ENCOUNTER — Encounter: Payer: Self-pay | Admitting: Neurology

## 2014-07-03 VITALS — BP 102/60 | HR 68 | Temp 97.8°F | Resp 16 | Ht 64.0 in | Wt 105.0 lb

## 2014-07-03 DIAGNOSIS — F0391 Unspecified dementia with behavioral disturbance: Secondary | ICD-10-CM

## 2014-07-03 DIAGNOSIS — F0150 Vascular dementia without behavioral disturbance: Secondary | ICD-10-CM

## 2014-07-03 DIAGNOSIS — F0390 Unspecified dementia without behavioral disturbance: Secondary | ICD-10-CM

## 2014-07-03 NOTE — Patient Instructions (Signed)
You are doing well. 1.  Continue the Namenda 10mg  twice daily 2.  Continue aspirin and cholesterol medication 3.  Try to go for walks with somebody now that the weather is getting better 4.  Follow up in 6 months

## 2014-07-03 NOTE — Progress Notes (Signed)
NEUROLOGY FOLLOW UP OFFICE NOTE  AZYIAH BO [id number]  HISTORY OF PRESENT ILLNESS: Jacqueline [name] is an 81 year old woman with anxiety, hypertension and hearing loss who follows up for mixed vascular and Alzheimer's dementia.  She is accompanied by her daughter-in-law.  Records and labs reviewed.  UPDATE: She currently takes Namenda  twice daily.  She is tolerating it well and no longer having nightmares.  She has good days and bad days.  Sometimes she is very lucid while other days she is more confused.  She sometimes has difficulty following conversations.  Lipid panel from 02/05/14 showed cholesterol 123, HDL 60.20 and LDL 47.     HISTORY: She underwent coronary artery bypass graft x 5 in May 2014.  She began to have symptoms prior, but has gotten much worse since the procedure.  Her family first started noticing symptoms about two years ago.  She was forgetting how to perform certain tasks, such as using the TV remote.  She often repeats questions.  She needs help administering her medications.  She also says that she has difficulty figuring out what she wants to say.  She has strong history of anxiety and insomnia.  She will often fall asleep easily during the day but is easily awoken.  She lives with her son and daughter-in-law.  She is able to perform daily activities such as bathing and dressing.  She is able to cook and use the stove, but only able to use the stove when somebody else is in the house.  She no longer drives.  There is no noticeable difficulty recalling names or recognizing people.  She has not had any change in behavior or personality.  She is sometimes irritable, but not combative. No hallucinations or delusions.  She has gait issues since a right hip replacement several years ago.  She has a shuffling gait and sometimes trips over things.  There are no abnormal movements noted.  No known family history of dementia.  She usually sits alone at home.  She  avoids social situations due to anxiety.  She worries a lot, particularly with new situations.  She did not get any sleep last night in anticipation for today's visit.  She feels a little sad at times, but not tearful.  No behavioral or personality changes.  Aricept was discontinued due to vivid dreams.  Rivastigmine caused mood swings.  Galantamine was too expensive.  01/12/13 MRI Brain wo reviewed:  moderate atrophy and advanced chronic microvascular ischemic changes.  PAST MEDICAL HISTORY: Past Medical History  Diagnosis Date  . Diverticulitis   . Hypertension   . Osteoporosis   . CAD (coronary artery disease)     MEDICATIONS: Current Outpatient Prescriptions on File Prior to Visit  Medication Sig Dispense Refill  . alendronate (FOSAMAX) 70 MG tablet TAKE 1 TABLET BY MOUTH EVERY 7 DAYS. TAKE WITH A FULL GLASS OF WATER ON AN EMPTY STOMACH 12 tablet 1  . amiodarone (PACERONE) 200 MG tablet TAKE 1 TABLET (200 MG TOTAL) BY MOUTH DAILY. 30 tablet 3  . aspirin EC 325 MG tablet Take 325 mg by mouth every evening.     Marland Kitchen atorvastatin (LIPITOR) 40 MG tablet TAKE 1 TABLET BY MOUTH DAILY AT 6 PM 90 tablet 0  . desloratadine (CLARINEX) 5 MG tablet Take 1 tablet (5 mg total) by mouth daily. 15 tablet 0  . fluticasone (FLONASE) 50 MCG/ACT nasal spray Place 2 sprays into both nostrils daily. 16 g 6  . folic  acid (FOLVITE) 1 MG tablet Take 1 tablet (1 mg total) by mouth daily. 90 tablet 0  . lisinopril (PRINIVIL,ZESTRIL) 20 MG tablet Take 1 tablet (20 mg total) by mouth daily. 30 tablet 12  . memantine (NAMENDA) 10 MG tablet TAKE 1/2 TAB BEDTIME X7 DAYS 1/2 TAB TWICE DAILY X7 DAYS 1/2 IN MORN & 1 BEDTIME X7 THEN TWICE DAILY 60 tablet 0  . memantine (NAMENDA) 10 MG tablet Take 1 tablet (10 mg total) by mouth 2 (two) times daily. 60 tablet 3  . metoprolol tartrate (LOPRESSOR) 25 MG tablet Take 0.5 tablets (12.5 mg total) by mouth 2 (two) times daily. 90 tablet 1  . Multiple Vitamin (MULTIVITAMIN) tablet  Take 1 tablet by mouth daily.    Marland Kitchen omeprazole (PRILOSEC) 20 MG capsule Take 1 capsule (20 mg total) by mouth daily. (Patient not taking: Reported on 07/03/2014) 90 capsule 1   No current facility-administered medications on file prior to visit.    ALLERGIES: No Known Allergies  FAMILY HISTORY: Family History  Problem Relation Age of Onset  . Hypertension Other   . Cancer Mother     stomach  . Diabetes Neg Hx   . Heart disease Neg Hx   . Stroke Neg Hx     SOCIAL HISTORY: History   Social History  . Marital Status: Widowed    Spouse Name: N/A  . Number of Children: 2  . Years of Education: 13+   Occupational History  . Retired    Social History Main Topics  . Smoking status: Former Smoker    Types: Cigarettes  . Smokeless tobacco: Never Used  . Alcohol Use: No     Comment: Occasional  . Drug Use: No  . Sexual Activity: No   Other Topics Concern  . Not on file   Social History Narrative   Regular exercise-no   Caffeine Use-yes    REVIEW OF SYSTEMS: Constitutional: No fevers, chills, or sweats, no generalized fatigue, change in appetite Eyes: No visual changes, double vision, eye pain Ear, nose and throat: No hearing loss, ear pain, nasal congestion, sore throat Cardiovascular: No chest pain, palpitations Respiratory:  No shortness of breath at rest or with exertion, wheezes GastrointestinaI: No nausea, vomiting, diarrhea, abdominal pain, fecal incontinence Genitourinary:  No dysuria, urinary retention or frequency Musculoskeletal:  No neck pain, back pain Integumentary: No rash, pruritus, skin lesions Neurological: as above Psychiatric: No depression, insomnia, anxiety Endocrine: No palpitations, fatigue, diaphoresis, mood swings, change in appetite, change in weight, increased thirst Hematologic/Lymphatic:  No anemia, purpura, petechiae. Allergic/Immunologic: no itchy/runny eyes, nasal congestion, recent allergic reactions, rashes  PHYSICAL EXAM: Filed  Vitals:   07/03/14 1023  BP: 102/60  Pulse: 68  Temp: 97.8 F (36.6 C)  Resp: 16   General: No acute distress Head:  Normocephalic/atraumatic Eyes:  Fundoscopic exam unremarkable without vessel changes, exudates, hemorrhages or papilledema. Neck: supple, no paraspinal tenderness, full range of motion Heart:  Regular rate and rhythm Lungs:  Clear to auscultation bilaterally Back: No paraspinal tenderness Neurological Exam: alert and oriented to person, place, and time. Attention span and concentration intact, delayed recall with some impairment, remote memory intact, fund of knowledge intact. Speech fluent and not dysarthric, language intact.  MMSE - Mini Mental State Exam 07/03/2014  Orientation to time 5  Orientation to Place 5  Registration 3  Attention/ Calculation 5  Recall 2  Language- name 2 objects 2  Language- repeat 1  Language- follow 3 step command 3  Language-  read & follow direction 1  Write a sentence 1  Copy design 1  Total score 29  CN II-XII intact. Fundi not visualized.  Bulk and tone normal, muscle strength 5/5 throughout.  Deep tendon reflexes 1+ throughout, toes downgoing.  Finger to nose intact.  Gait cautious and unsteady with short stride.  IMPRESSION: Mixed vascular and Alzheimer's dementia, stable  PLAN: Namenda 10mg  twice daily ASA and statin (LDL at goal of less than 100) Follow up in 6 months  30 minutes spent with patient, over 50% spent discussing management.   Jacqueline MilletAdam Hattie Pine, DO  CC:  Neena RhymesKatherine Tabori, MD

## 2014-07-18 ENCOUNTER — Telehealth: Payer: Self-pay | Admitting: Family Medicine

## 2014-07-18 ENCOUNTER — Ambulatory Visit: Payer: Self-pay | Admitting: Physician Assistant

## 2014-07-18 NOTE — Telephone Encounter (Signed)
Called and spoke with patient's daughter due to appointment with Malva Coganody Martin being cancelled.  Patient's daughter states that her mother's feet are not hurting and the discoloration is a chronic issue, but she has noticed the veins in her legs more recently and the problem is worsening.  Notified daughter that she should be seen in the office and to try to make appointment.  Patient's daughter states it is impossible to get her to office today and that she will take her to Urgent Care tomorrow.  Notified daughter that this was not our recommendation, daughter stated understanding.

## 2014-07-18 NOTE — Telephone Encounter (Signed)
Patient Name: Jacqueline [name] DOB: 1929/01/03 Initial Comment caller states her feet are purple and her toe is bleeding Nurse Assessment Nurse: Yetta BarreJones, RN, Miranda Date/Time (Eastern Time): 07/18/2014 10:30:36 AM Confirm and document reason for call. If symptomatic, describe symptoms. ---Caller states the veins on the tops of her feet are swollen and purple. Her ankles are swollen. The big toes nails on both feet look like they are going to fall off. She noticed blood coming from her right little toe. Has the patient traveled out of the country within the last 30 days? ---Not Applicable Does the patient require triage? ---Yes Related visit to physician within the last 2 weeks? ---No Does the PT have any chronic conditions? (i.e. diabetes, asthma, etc.) ---Yes List chronic conditions. ---hx of heart problems, High Cholesterol, Alzheimer's Guidelines Guideline Title Affirmed Question Affirmed Notes Leg Swelling and Edema [1] MILD swelling of both ankles (i.e., pedal edema) AND [2] new onset or worsening Final Disposition User See PCP When Office is Open (within 3 days) Yetta BarreJones, RN, [employer] Comments Spoke with pt's daughter in Social workerlaw, Jacqueline [name]. She states pt's feet are purple. She states the pt denies any pain. Pt is only able to be seen today, cannot come in later this week. Appt scheduled today at 415 with Malva Coganody Martin PA

## 2014-07-19 ENCOUNTER — Emergency Department (HOSPITAL_COMMUNITY)
Admission: EM | Admit: 2014-07-19 | Discharge: 2014-07-19 | Disposition: A | Discharge status: Home / Self Care | Payer: Commercial Managed Care - HMO | Attending: Emergency Medicine | Admitting: Emergency Medicine

## 2014-07-19 ENCOUNTER — Encounter (HOSPITAL_COMMUNITY): Payer: Self-pay | Admitting: Emergency Medicine

## 2014-07-19 DIAGNOSIS — Z951 Presence of aortocoronary bypass graft: Secondary | ICD-10-CM | POA: Insufficient documentation

## 2014-07-19 DIAGNOSIS — Z7982 Long term (current) use of aspirin: Secondary | ICD-10-CM | POA: Diagnosis not present

## 2014-07-19 DIAGNOSIS — Z8719 Personal history of other diseases of the digestive system: Secondary | ICD-10-CM | POA: Insufficient documentation

## 2014-07-19 DIAGNOSIS — I2510 Atherosclerotic heart disease of native coronary artery without angina pectoris: Secondary | ICD-10-CM | POA: Insufficient documentation

## 2014-07-19 DIAGNOSIS — M1990 Unspecified osteoarthritis, unspecified site: Secondary | ICD-10-CM | POA: Diagnosis not present

## 2014-07-19 DIAGNOSIS — I10 Essential (primary) hypertension: Secondary | ICD-10-CM | POA: Diagnosis present

## 2014-07-19 DIAGNOSIS — Z87891 Personal history of nicotine dependence: Secondary | ICD-10-CM | POA: Insufficient documentation

## 2014-07-19 DIAGNOSIS — Z7952 Long term (current) use of systemic steroids: Secondary | ICD-10-CM | POA: Diagnosis not present

## 2014-07-19 DIAGNOSIS — IMO0001 Reserved for inherently not codable concepts without codable children: Secondary | ICD-10-CM

## 2014-07-19 DIAGNOSIS — R030 Elevated blood-pressure reading, without diagnosis of hypertension: Secondary | ICD-10-CM

## 2014-07-19 LAB — CBC WITH DIFFERENTIAL/PLATELET
BASOS ABS: 0 10*3/uL (ref 0.0–0.1)
Basophils Relative: 0 % (ref 0–1)
Eosinophils Absolute: 0.1 10*3/uL (ref 0.0–0.7)
Eosinophils Relative: 1 % (ref 0–5)
HCT: 38.5 % (ref 36.0–46.0)
HEMOGLOBIN: 12.6 g/dL (ref 12.0–15.0)
LYMPHS ABS: 1.2 10*3/uL (ref 0.7–4.0)
LYMPHS PCT: 18 % (ref 12–46)
MCH: 33.8 pg (ref 26.0–34.0)
MCHC: 32.7 g/dL (ref 30.0–36.0)
MCV: 103.2 fL — ABNORMAL HIGH (ref 78.0–100.0)
MONOS PCT: 9 % (ref 3–12)
Monocytes Absolute: 0.6 10*3/uL (ref 0.1–1.0)
NEUTROS ABS: 4.8 10*3/uL (ref 1.7–7.7)
NEUTROS PCT: 72 % (ref 43–77)
PLATELETS: 234 10*3/uL (ref 150–400)
RBC: 3.73 MIL/uL — ABNORMAL LOW (ref 3.87–5.11)
RDW: 15 % (ref 11.5–15.5)
WBC: 6.6 10*3/uL (ref 4.0–10.5)

## 2014-07-19 LAB — I-STAT CHEM 8, ED
BUN: 17 mg/dL (ref 6–23)
Calcium, Ion: 1.22 mmol/L (ref 1.13–1.30)
Chloride: 101 mmol/L (ref 96–112)
Creatinine, Ser: 0.7 mg/dL (ref 0.50–1.10)
Glucose, Bld: 98 mg/dL (ref 70–99)
HEMATOCRIT: 42 % (ref 36.0–46.0)
Hemoglobin: 14.3 g/dL (ref 12.0–15.0)
Potassium: 4.2 mmol/L (ref 3.5–5.1)
Sodium: 139 mmol/L (ref 135–145)
TCO2: 23 mmol/L (ref 0–100)

## 2014-07-19 LAB — BRAIN NATRIURETIC PEPTIDE: B NATRIURETIC PEPTIDE 5: 191 pg/mL — AB (ref 0.0–100.0)

## 2014-07-19 NOTE — Discharge Instructions (Signed)
Continue taking your regular home blood pressure medications.  Recommend to keep a log of your blood pressure for the next few days. Follow-up with your primary care physician. Return to the ED for new concerns.

## 2014-07-19 NOTE — ED Provider Notes (Signed)
CSN: [id number]     Arrival date & time 07/19/14  1740 History   First MD Initiated Contact with Patient 07/19/14 1857     Chief Complaint  Patient presents with  . Hypertension     (Consider location/radiation/quality/duration/timing/severity/associated sxs/prior Treatment) Patient is a 81 y.o. female presenting with hypertension. The history is provided by the patient and medical records.  Hypertension  This is an 81 y.o. F with PMH significant for HTN, osteoporosis, CAD, presenting to the ED from urgent care for further evaluation of elevated BP.  Patient states she has been feeling well, initially went to urgent care for a right toe injury that occurred 2 days ago.  States she opened the door on top of her right 5th toe and thinks she damaged the toenail.  She had a small amount of bleeding but states otherwise normal.  Additionally she notes for the past few days her legs have appeared more swollen than normal and have been turning purple.  States her feet are very cold, L > R, but this is not necessarily out of the ordinary for her.  Denies any chest pain or SOB.  No dizziness, lightheadedness, visual disturbance, numbness, weakness, or headaches.  States she has been compliant with all her meds, took her BP meds earlier this morning.  Past Medical History  Diagnosis Date  . Diverticulitis   . Hypertension   . Osteoporosis   . CAD (coronary artery disease)    Past Surgical History  Procedure Laterality Date  . Fracture surgery    . Partial colectomy    . Coronary artery bypass graft N/A 08/25/2012    Procedure: CORONARY ARTERY BYPASS GRAFTING ;  Surgeon: Alleen Borne, MD;  Location: [facility] OR;  Service: Open Heart Surgery;  Laterality: N/A;  four bypasses total   . Left heart catheterization with coronary angiogram N/A 08/22/2012    Procedure: LEFT HEART CATHETERIZATION WITH CORONARY ANGIOGRAM;  Surgeon: Peter M Swaziland, MD;  Location: [facility] CATH LAB;  Service: Cardiovascular;  Laterality:  N/A;   Family History  Problem Relation Age of Onset  . Hypertension Other   . Cancer Mother     stomach  . Diabetes Neg Hx   . Heart disease Neg Hx   . Stroke Neg Hx    History  Substance Use Topics  . Smoking status: Former Smoker    Types: Cigarettes  . Smokeless tobacco: Never Used  . Alcohol Use: No     Comment: Occasional   OB History    No data available     Review of Systems  Cardiovascular: Positive for leg swelling.  All other systems reviewed and are negative.     Allergies  Review of patient's allergies indicates no known allergies.  Home Medications   Prior to Admission medications   Medication Sig Start Date End Date Taking? Authorizing Provider  alendronate (FOSAMAX) 70 MG tablet TAKE 1 TABLET BY MOUTH EVERY 7 DAYS. TAKE WITH A FULL GLASS OF WATER ON AN EMPTY STOMACH 02/27/14   Sheliah Hatch, MD  amiodarone (PACERONE) 200 MG tablet TAKE 1 TABLET (200 MG TOTAL) BY MOUTH DAILY. 05/11/14   Sheliah Hatch, MD  aspirin EC 325 MG tablet Take 325 mg by mouth every evening.     Historical Provider, MD  atorvastatin (LIPITOR) 40 MG tablet TAKE 1 TABLET BY MOUTH DAILY AT 6 PM 06/06/14   Sheliah Hatch, MD  desloratadine (CLARINEX) 5 MG tablet Take 1 tablet (5 mg total)  by mouth daily. 08/10/13   Gilda Creasehristopher J Pollina, MD  fluticasone (FLONASE) 50 MCG/ACT nasal spray Place 2 sprays into both nostrils daily. 10/05/13   Sheliah HatchKatherine E Tabori, MD  folic acid (FOLVITE) 1 MG tablet Take 1 tablet (1 mg total) by mouth daily. 06/20/14   Sheliah HatchKatherine E Tabori, MD  lisinopril (PRINIVIL,ZESTRIL) 20 MG tablet Take 1 tablet (20 mg total) by mouth daily. 09/11/13   Lewayne BuntingBrian S Crenshaw, MD  memantine (NAMENDA) 10 MG tablet TAKE 1/2 TAB BEDTIME X7 DAYS 1/2 TAB TWICE DAILY X7 DAYS 1/2 IN [place] & 1 BEDTIME X7 THEN TWICE DAILY 04/09/14   Drema DallasAdam R Jaffe, DO  memantine (NAMENDA) 10 MG tablet Take 1 tablet (10 mg total) by mouth 2 (two) times daily. 05/01/14   Drema DallasAdam R Jaffe, DO  metoprolol  tartrate (LOPRESSOR) 25 MG tablet Take 0.5 tablets (12.5 mg total) by mouth 2 (two) times daily. 03/27/14   Sheliah HatchKatherine E Tabori, MD  Multiple Vitamin (MULTIVITAMIN) tablet Take 1 tablet by mouth daily.    Historical Provider, MD  omeprazole (PRILOSEC) 20 MG capsule Take 1 capsule (20 mg total) by mouth daily. Patient not taking: Reported on 07/03/2014 03/27/14   Sheliah HatchKatherine E Tabori, MD   BP 185/81 mmHg  Pulse 56  Temp(Src) 97.9 F (36.6 C) (Oral)  Resp 24  Ht 5\' 4"  (1.626 m)  Wt 107 lb (48.535 kg)  BMI 18.36 kg/m2  SpO2 100%   Physical Exam  Constitutional: She is oriented to person, place, and time. She appears well-developed and well-nourished. No distress.  HENT:  Head: Normocephalic and atraumatic.  Mouth/Throat: Oropharynx is clear and moist.  Eyes: Conjunctivae and EOM are normal. Pupils are equal, round, and reactive to light.  Neck: Normal range of motion. Neck supple.  Cardiovascular: Normal rate, regular rhythm and normal heart sounds.   Palpable right DP pulse Left DP pulse audible with doppler  Pulmonary/Chest: Effort normal and breath sounds normal. No respiratory distress. She has no wheezes.  Abdominal: Soft. Bowel sounds are normal. There is no tenderness. There is no guarding.  Musculoskeletal: Normal range of motion.  variscosities of bilateral ankles and feet; feet are cool to touch but with normal cap refill Dried blood noted to right 5th toenail, nail remains intact; no gross deformity or swelling about the toe; full ROM No calf asymmetry, tenderness, or palpable cords; no overlying erythema or warmth to touch  Neurological: She is alert and oriented to person, place, and time.  Skin: Skin is warm and dry. She is not diaphoretic.  Psychiatric: She has a normal mood and affect.  Nursing note and vitals reviewed.   ED Course  Procedures (including critical care time) Labs Review Labs Reviewed  CBC WITH DIFFERENTIAL/PLATELET - Abnormal; Notable for the  following:    RBC 3.73 (*)    MCV 103.2 (*)    All other components within normal limits  BRAIN NATRIURETIC PEPTIDE - Abnormal; Notable for the following:    B Natriuretic Peptide 191.0 (*)    All other components within normal limits  I-STAT CHEM 8, ED    Imaging Review No results found.   EKG Interpretation None      MDM   Final diagnoses:  Elevated blood pressure   81 year old female here with elevated blood pressure. She was initially being seen at urgent care for right fifth toe injury and some discoloration of her feet.  On exam patient does have varicosities of bilateral ankles and feet. Her feet are cool to  touch but she has normal cap refill. Pulses present in both lower extremities. No clinical signs of DVT.  There is dried blood noted to the right fifth toenail, but no gross deformities or swelling about the toe.  Patient's BP has improved from time of arrival. She remains without any complaint of headache, dizziness, chest pain, or shortness of breath. Her lab work is reassuring, no signs of end organ damage. Her current BP is 165/78 without intervention while in the ED. Feel the patient may be discharged home. I have recommended that she keep a BP log for the next few days.  Encouraged close FU with PCP.  Discussed plan with patient, he/she acknowledged understanding and agreed with plan of care.  Return precautions given for new or worsening symptoms.  Case discussed with attending physician, Dr. Patria Mane, who evaluated patient and agrees with assessment and plan of care.  Garlon Hatchet, PA-C 07/19/14 2203  Azalia Bilis, MD 07/20/14 [phone]

## 2014-07-19 NOTE — ED Notes (Signed)
Dr Patria Maneampos and Misty StanleyLisa PA in room with this RN to assess peripheral vascularity of left foot with doppler

## 2014-07-19 NOTE — ED Notes (Signed)
[facility]. Hypertension and bil lower leg swelling.  Pt denies any pain

## 2014-07-19 NOTE — ED Notes (Signed)
Pt verbalized understanding of d.c instructions, no further questions 

## 2014-08-06 ENCOUNTER — Other Ambulatory Visit: Payer: Self-pay | Admitting: General Practice

## 2014-08-06 MED ORDER — ALENDRONATE SODIUM 70 MG PO TABS: ORAL_TABLET | ORAL | Status: DC

## 2014-08-20 ENCOUNTER — Ambulatory Visit (HOSPITAL_BASED_OUTPATIENT_CLINIC_OR_DEPARTMENT_OTHER)
Admission: RE | Admit: 2014-08-20 | Discharge: 2014-08-20 | Disposition: A | Discharge status: Home / Self Care | Payer: Commercial Managed Care - HMO | Source: Ambulatory Visit | Attending: Family Medicine | Admitting: Family Medicine

## 2014-08-20 ENCOUNTER — Ambulatory Visit (INDEPENDENT_AMBULATORY_CARE_PROVIDER_SITE_OTHER): Payer: Commercial Managed Care - HMO | Admitting: Family Medicine

## 2014-08-20 ENCOUNTER — Encounter: Payer: Self-pay | Admitting: Family Medicine

## 2014-08-20 VITALS — BP 118/78 | HR 51 | Temp 97.9°F | Resp 16

## 2014-08-20 DIAGNOSIS — M11261 Other chondrocalcinosis, right knee: Secondary | ICD-10-CM | POA: Insufficient documentation

## 2014-08-20 DIAGNOSIS — M8580 Other specified disorders of bone density and structure, unspecified site: Secondary | ICD-10-CM | POA: Diagnosis not present

## 2014-08-20 DIAGNOSIS — M25561 Pain in right knee: Secondary | ICD-10-CM | POA: Insufficient documentation

## 2014-08-20 DIAGNOSIS — M25461 Effusion, right knee: Secondary | ICD-10-CM | POA: Insufficient documentation

## 2014-08-20 MED ORDER — DICLOFENAC SODIUM 1 % TD GEL
4.0000 g | Freq: Four times a day (QID) | TRANSDERMAL | Status: DC
Start: 2014-08-20 — End: 2015-01-16

## 2014-08-20 NOTE — Progress Notes (Signed)
Pre visit review using our clinic review tool, if applicable. No additional management support is needed unless otherwise documented below in the visit note. 

## 2014-08-20 NOTE — Progress Notes (Signed)
   Subjective:    Patient ID: Jacqueline [name], female    DOB: 17-Aug-1928, 81 y.o.   MRN: [id number]  HPI R knee pain- sxs started after long car ride to [place].  No swelling.  Stopped the car every few hrs to get out and walk around.  Pt was sitting w/ knees at odd angle due to luggage placement.  Painful to bear weight.  Pain for 1 week.  Pain is anteromedial and radiates into lower leg.   Review of Systems For ROS see HPI     Objective:   Physical Exam  Constitutional: She appears well-developed and well-nourished. No distress.  Sitting in wheel chair  Cardiovascular: Intact distal pulses.   Musculoskeletal: She exhibits edema (mild swelling of superior R knee ). She exhibits no tenderness.  + crepitus w/ R knee flexion/extension but no pain Pain w/ weight bearing on R knee Bone on bone along medial and lateral joint but no TTP No LE edema, no palpable cord  Skin: Skin is warm and dry. No erythema.  Vitals reviewed.         Assessment & Plan:

## 2014-08-20 NOTE — Patient Instructions (Signed)
Follow up as needed Stop downstairs and get xrays of knee Start the Voltaren gel on the knee for pain Add Tylenol for pain relief ICE! Call with any questions or concerns Hang in there!!!

## 2014-08-21 NOTE — Assessment & Plan Note (Signed)
New.  No evidence of DVT based on hx and PE.  Pt's pain and knee exam consistent w/ degenerative changes.  Due to hx of osteoporosis, will get xray to r/o fx.  Start topical voltaren gel, tylenol, ice.  Reviewed supportive care and red flags that should prompt return.  Pt expressed understanding and is in agreement w/ plan.

## 2014-09-01 ENCOUNTER — Other Ambulatory Visit: Payer: Self-pay | Admitting: Family Medicine

## 2014-09-03 NOTE — Telephone Encounter (Signed)
Med filled.  

## 2014-09-04 ENCOUNTER — Other Ambulatory Visit: Payer: Self-pay | Admitting: General Practice

## 2014-09-04 MED ORDER — ATORVASTATIN CALCIUM 40 MG PO TABS: ORAL_TABLET | ORAL | Status: DC

## 2014-09-06 NOTE — Progress Notes (Signed)
HPI: Fu CAD. Patient apparently on amiodarone in the past for paroxysmal atrial fibrillation. Also with SVT. Echocardiogram in May 2014 showed normal LV function, mild aortic stenosis with a mean gradient of 11 mm of mercury, and mild LAE. Carotid Dopplers in May of 2014 showed no significant stenosis. Cardiac cath in May of 2014 showed 3 vessel CAD. She subsequently underwent CABG x 5 per Dr. Laneta SimmersBartle with LIMA to LAD, SVG to DX, SVG to 1st and 2nd OM, SVG to RCA. Since she was last seen she denies dyspnea, chest pain, palpitations or syncope.  Current Outpatient Prescriptions  Medication Sig Dispense Refill  . alendronate (FOSAMAX) 70 MG tablet TAKE 1 TABLET BY MOUTH EVERY 7 DAYS. TAKE WITH A FULL GLASS OF WATER ON AN EMPTY STOMACH 12 tablet 1  . amiodarone (PACERONE) 200 MG tablet TAKE 1 TABLET (200 MG TOTAL) BY MOUTH DAILY. 30 tablet 3  . aspirin EC 325 MG tablet Take 325 mg by mouth every evening.     Marland Kitchen. atorvastatin (LIPITOR) 40 MG tablet TAKE 1 TABLET BY MOUTH DAILY AT 6 PM 90 tablet 0  . desloratadine (CLARINEX) 5 MG tablet Take 1 tablet (5 mg total) by mouth daily. 15 tablet 0  . diclofenac sodium (VOLTAREN) 1 % GEL Apply 4 g topically 4 (four) times daily. 100 g 3  . fluticasone (FLONASE) 50 MCG/ACT nasal spray Place 2 sprays into both nostrils daily. 16 g 6  . folic acid (FOLVITE) 1 MG tablet Take 1 tablet (1 mg total) by mouth daily. 90 tablet 0  . lisinopril (PRINIVIL,ZESTRIL) 20 MG tablet Take 1 tablet (20 mg total) by mouth daily. 30 tablet 12  . memantine (NAMENDA) 10 MG tablet TAKE 1/2 TAB BEDTIME X7 DAYS 1/2 TAB TWICE DAILY X7 DAYS 1/2 IN MORN & 1 BEDTIME X7 THEN TWICE DAILY 60 tablet 0  . memantine (NAMENDA) 10 MG tablet Take 1 tablet (10 mg total) by mouth 2 (two) times daily. 60 tablet 3  . metoprolol tartrate (LOPRESSOR) 25 MG tablet Take 0.5 tablets (12.5 mg total) by mouth 2 (two) times daily. 90 tablet 1  . Multiple Vitamin (MULTIVITAMIN) tablet Take 1 tablet by mouth  daily.     No current facility-administered medications for this visit.     Past Medical History  Diagnosis Date  . Diverticulitis   . Hypertension   . Osteoporosis   . CAD (coronary artery disease)     Past Surgical History  Procedure Laterality Date  . Fracture surgery    . Partial colectomy    . Coronary artery bypass graft N/A 08/25/2012    Procedure: CORONARY ARTERY BYPASS GRAFTING ;  Surgeon: Alleen BorneBryan K Bartle, MD;  Location: [facility] OR;  Service: Open Heart Surgery;  Laterality: N/A;  four bypasses total   . Left heart catheterization with coronary angiogram N/A 08/22/2012    Procedure: LEFT HEART CATHETERIZATION WITH CORONARY ANGIOGRAM;  Surgeon: Peter M SwazilandJordan, MD;  [facility];  Service: Cardiovascular;  Laterality: N/A;    History   Social History  . Marital Status: Widowed    Spouse Name: N/A  . Number of Children: 2  . Years of Education: 13+   Occupational History  . Retired    Social History Main Topics  . Smoking status: Former Smoker    Types: Cigarettes  . Smokeless tobacco: Never Used  . Alcohol Use: No     Comment: Occasional  . Drug Use: No  . Sexual  Activity: No   Other Topics Concern  . Not on file   Social History Narrative   Regular exercise-no   Caffeine Use-yes    ROS: no fevers or chills, productive cough, hemoptysis, dysphasia, odynophagia, melena, hematochezia, dysuria, hematuria, rash, seizure activity, orthopnea, PND, pedal edema, claudication. Remaining systems are negative.  Physical Exam: Well-developed well-nourished in no acute distress.  Skin is warm and dry.  HEENT is normal.  Neck is supple.  Chest is clear to auscultation with normal expansion.  Cardiovascular exam is regular rate and rhythm. 2/6 systolic murmur left sternal border. S2 is not diminished. Abdominal exam nontender or distended. No masses palpated. Extremities show no edema. neuro grossly intact  ECG July 19 2014-sinus rhythm, incomplete right  bundle branch block.

## 2014-09-11 ENCOUNTER — Encounter: Payer: Self-pay | Admitting: Cardiology

## 2014-09-11 ENCOUNTER — Ambulatory Visit (INDEPENDENT_AMBULATORY_CARE_PROVIDER_SITE_OTHER): Payer: Commercial Managed Care - HMO | Admitting: Cardiology

## 2014-09-11 VITALS — BP 218/108 | HR 61 | Ht 66.0 in | Wt 110.0 lb

## 2014-09-11 DIAGNOSIS — I10 Essential (primary) hypertension: Secondary | ICD-10-CM

## 2014-09-11 DIAGNOSIS — I4891 Unspecified atrial fibrillation: Secondary | ICD-10-CM | POA: Diagnosis not present

## 2014-09-11 MED ORDER — HYDROCHLOROTHIAZIDE 12.5 MG PO CAPS: 12.5000 mg | ORAL_CAPSULE | Freq: Every day | ORAL | Status: DC

## 2014-09-11 MED ORDER — LISINOPRIL 40 MG PO TABS: 40.0000 mg | ORAL_TABLET | Freq: Every day | ORAL | Status: DC

## 2014-09-11 NOTE — Patient Instructions (Signed)
Your physician recommends that you schedule a follow-up appointment in: 6-8 WEEKS WITH APP FOR BLOOD PRESSURE CHECK  Your physician wants you to follow-up in: ONE YEAR WITH DR Shelda PalRENSHAW You will receive a reminder letter in the mail two months in advance. If you don't receive a letter, please call our office to schedule the follow-up appointment.  INCREASE LISINOPRIL TO 40 MG ONCE DAILY= 2 OF THE 20 MG TABLETS ONCE DAILY  START HCTZ 12.5 MG ONCE DAILY  Your physician recommends that you return for lab work in: ONE WEEK-[facility] TO THE BASEMENT  A chest x-ray takes a picture of the organs and structures inside the chest, including the heart, lungs, and blood vessels. This test can show several things, including, whether the heart is enlarges; whether fluid is building up in the lungs; and whether pacemaker / defibrillator leads are still in place. IN ONE WEEK- [facility]-[address]

## 2014-09-11 NOTE — Assessment & Plan Note (Signed)
Continue aspirin and statin. 

## 2014-09-11 NOTE — Assessment & Plan Note (Signed)
Still sounds mild on examination.

## 2014-09-11 NOTE — Assessment & Plan Note (Signed)
Continue statin. 

## 2014-09-11 NOTE — Assessment & Plan Note (Signed)
Patient apparently with history of atrial fibrillation and SVT. Continue amiodarone. Continue aspirin. I would be hesitant to anticoagulate given frail body habitus and history of mild dementia. Also with history of fall. Check TSH, LFTs and chest x-ray.

## 2014-09-11 NOTE — Assessment & Plan Note (Signed)
Blood pressure is elevated. Increase lisinopril to 40 mg daily and add HCTZ 12.5 mg daily. Check potassium and renal function in 1 week. I have asked them to check her blood pressure at home and keep recordings. She will bring them at next office visit and we will adjust her regimen further as needed.

## 2014-09-14 ENCOUNTER — Other Ambulatory Visit: Payer: Self-pay | Admitting: General Practice

## 2014-09-14 MED ORDER — OMEPRAZOLE 20 MG PO CPDR: 20.0000 mg | DELAYED_RELEASE_CAPSULE | Freq: Every day | ORAL | Status: DC

## 2014-09-17 ENCOUNTER — Telehealth: Payer: Self-pay | Admitting: Neurology

## 2014-09-17 NOTE — Telephone Encounter (Signed)
Daug in law, Marylene Landngela called, pt needs refill on Namenda 10mg . Please call in at [facility] [name]'s # [phone] / Sherri S.

## 2014-09-19 ENCOUNTER — Other Ambulatory Visit (INDEPENDENT_AMBULATORY_CARE_PROVIDER_SITE_OTHER): Payer: Commercial Managed Care - HMO

## 2014-09-19 ENCOUNTER — Other Ambulatory Visit: Payer: Self-pay | Admitting: *Deleted

## 2014-09-19 ENCOUNTER — Ambulatory Visit (INDEPENDENT_AMBULATORY_CARE_PROVIDER_SITE_OTHER)
Admission: RE | Admit: 2014-09-19 | Discharge: 2014-09-19 | Disposition: A | Discharge status: Home / Self Care | Payer: Commercial Managed Care - HMO | Source: Ambulatory Visit | Attending: Cardiology | Admitting: Cardiology

## 2014-09-19 DIAGNOSIS — I4891 Unspecified atrial fibrillation: Secondary | ICD-10-CM

## 2014-09-19 DIAGNOSIS — I10 Essential (primary) hypertension: Secondary | ICD-10-CM | POA: Diagnosis not present

## 2014-09-19 LAB — BASIC METABOLIC PANEL
BUN: 13 mg/dL (ref 6–23)
CALCIUM: 9.2 mg/dL (ref 8.4–10.5)
CHLORIDE: 100 meq/L (ref 96–112)
CO2: 30 mEq/L (ref 19–32)
CREATININE: 0.76 mg/dL (ref 0.40–1.20)
GFR: 77.09 mL/min (ref >60.00)
GLUCOSE: 118 mg/dL — AB (ref 70–99)
POTASSIUM: 4.2 meq/L (ref 3.5–5.1)
Sodium: 133 mEq/L — ABNORMAL LOW (ref 135–145)

## 2014-09-19 LAB — HEPATIC FUNCTION PANEL
ALBUMIN: 3.6 g/dL (ref 3.5–5.2)
ALT: 33 U/L (ref 0–35)
AST: 32 U/L (ref 0–37)
Alkaline Phosphatase: 85 U/L (ref 39–117)
BILIRUBIN DIRECT: 0.2 mg/dL (ref 0.0–0.3)
Total Bilirubin: 0.6 mg/dL (ref 0.2–1.2)
Total Protein: 6.8 g/dL (ref 6.0–8.3)

## 2014-09-19 LAB — TSH: TSH: 1.9 u[IU]/mL (ref 0.35–4.50)

## 2014-09-19 MED ORDER — MEMANTINE HCL 10 MG PO TABS: 10.0000 mg | ORAL_TABLET | Freq: Two times a day (BID) | ORAL | Status: DC

## 2014-09-19 NOTE — Telephone Encounter (Signed)
Rx sent to pharmacy   

## 2014-09-20 ENCOUNTER — Ambulatory Visit (INDEPENDENT_AMBULATORY_CARE_PROVIDER_SITE_OTHER): Payer: Commercial Managed Care - HMO | Admitting: Internal Medicine

## 2014-09-20 ENCOUNTER — Encounter: Payer: Self-pay | Admitting: Internal Medicine

## 2014-09-20 VITALS — BP 168/92 | HR 54 | Temp 98.1°F | Resp 14 | Wt 108.0 lb

## 2014-09-20 DIAGNOSIS — M25562 Pain in left knee: Secondary | ICD-10-CM | POA: Diagnosis not present

## 2014-09-20 DIAGNOSIS — M810 Age-related osteoporosis without current pathological fracture: Secondary | ICD-10-CM

## 2014-09-20 MED ORDER — TRAMADOL HCL 50 MG PO TABS: 50.0000 mg | ORAL_TABLET | Freq: Three times a day (TID) | ORAL | Status: DC | PRN

## 2014-09-20 NOTE — Progress Notes (Signed)
   Subjective:    Patient ID: Jacqueline [name], female    DOB: 16-Jan-1929, 81 y.o.   MRN: [id number]  HPI  She was getting into a car 09/11/14 when she heard a "pop" in the left knee. The pain was below the knee and in the anterior shin radiating to the ankle. She was not able to ambulate without pain. She felt the leg would not support her. She did not take any medication for this but simply rested with subsequent improvement over several days.  Yesterday she stumbled on the stairway  But was caught by a relative and landed on her buttocks.. The pain in the knee has exacerbated. When she tries to stand it is a 10. She is unable to ambulate. She did take 2 aspirin last night.  There was no cardio neuro prodrome prior to the stumble event yesterday or the event 5/31.She has no associated neuromuscular symptoms.    She was seen 08/20/14 for knee pain in the context of prolonged travel with the leg flexed. The films revealed tricompartment degenerative changes with findings of a chondrocalcinosis. No fracture was noted. Bones are osteopenic. She did have a small suprapatellar joint effusion. Significant is a history of osteoporosis ;she is on alendronate weekly.  Last bone density was 04/10/14; T score in the spine was -2.6 and at the left femoral neck -4.    Review of Systems  Denied were any change in heart rhythm or rate prior to the events. There was no associated chest pain or shortness of breath . Also specifically denied prior to the episode were headache, limb weakness, tingling, or numbness. No seizure activity noted. Fever, chills, sweats, or unexplained weight loss not present. Vertigo, near syncope or imbalance denied. There is no numbnessor tingling in extremities.   No loss of control of bladder or bowels. Radicular type pain absent.      Objective:   Physical Exam Pertinent or positive findings include: She is thin but appears adequately nourished and in no distress.  She has pattern alopecia. There is a grade 1.5-2 systolic murmur at the base. She has varicose veins at the ankle area. Pedal pulses are decreased especially dorsalis pedis pulses. She has 1/2+ edema of the left lower extremity. There is dependent rubor of the skin of the lower extremities. Fusiform changes are noted in the knees. She has pain with any range of motion of the left knee. I cannot clinically appreciate effusion.   Eyes: No conjunctival inflammation or scleral icterus is present. Oral exam:  Lips and gums are healthy appearing.There is no oropharyngeal erythema or exudate noted. Dental hygiene is good. Heart:  Normal rate and regular rhythm. S1 and S2 normal without gallop, click, rub or other extra sounds   Lungs:Chest clear to auscultation; no wheezes, rhonchi,rales ,or rubs present.No increased work of breathing.  Abdomen: bowel sounds normal, soft and non-tender without masses, organomegaly or hernias noted.  No guarding or rebound.  Vascular : all pulses equal ; no bruits present. Skin:Warm & dry.  Intact without suspicious lesions or rashes ; no tenting or jaundice  Lymphatic: No lymphadenopathy is noted about the head, neck, axilla Neuro: Strength, tonedecreased.          Assessment & Plan:  #1 knee pain most likely from soft tissue injury rather than fracture  #2 history of osteoporosis  Plan: See orders and recommendations

## 2014-09-20 NOTE — Progress Notes (Signed)
Pre visit review using our clinic review tool, if applicable. No additional management support is needed unless otherwise documented below in the visit note. 

## 2014-09-20 NOTE — Patient Instructions (Addendum)
The Orthopedic referral will be scheduled and you'll be notified of the time.Please call the Referral Co-Ordinator @ [phone] if you have not been notified of appointment time within 24 hours. Use an anti-inflammatory cream such as  Voltaren ,Aspercreme or Zostrix cream twice a day to the affected area as needed. In lieu of this warm moist compresses or  hot water bottle can be used. Do not apply ice .

## 2014-09-21 ENCOUNTER — Other Ambulatory Visit: Payer: Self-pay | Admitting: General Practice

## 2014-09-21 MED ORDER — METOPROLOL TARTRATE 25 MG PO TABS: 12.5000 mg | ORAL_TABLET | Freq: Two times a day (BID) | ORAL | Status: DC

## 2014-09-22 ENCOUNTER — Other Ambulatory Visit: Payer: Self-pay | Admitting: Family Medicine

## 2014-09-24 NOTE — Telephone Encounter (Signed)
Med filled.  

## 2014-10-16 ENCOUNTER — Ambulatory Visit (INDEPENDENT_AMBULATORY_CARE_PROVIDER_SITE_OTHER): Payer: Commercial Managed Care - HMO | Admitting: Physician Assistant

## 2014-10-16 ENCOUNTER — Encounter: Payer: Self-pay | Admitting: Physician Assistant

## 2014-10-16 VITALS — BP 108/94 | HR 52 | Temp 98.3°F | Ht 66.0 in | Wt 106.4 lb

## 2014-10-16 DIAGNOSIS — R29898 Other symptoms and signs involving the musculoskeletal system: Secondary | ICD-10-CM

## 2014-10-16 DIAGNOSIS — F32A Depression, unspecified: Secondary | ICD-10-CM

## 2014-10-16 DIAGNOSIS — F329 Major depressive disorder, single episode, unspecified: Secondary | ICD-10-CM | POA: Diagnosis not present

## 2014-10-16 LAB — BASIC METABOLIC PANEL
BUN: 13 mg/dL (ref 6–23)
CALCIUM: 9 mg/dL (ref 8.4–10.5)
CHLORIDE: 92 meq/L — AB (ref 96–112)
CO2: 26 mEq/L (ref 19–32)
CREATININE: 0.69 mg/dL (ref 0.40–1.20)
GFR: 86.16 mL/min (ref >60.00)
Glucose, Bld: 108 mg/dL — ABNORMAL HIGH (ref 70–99)
Potassium: 3.8 mEq/L (ref 3.5–5.1)
Sodium: 126 mEq/L — ABNORMAL LOW (ref 135–145)

## 2014-10-16 LAB — CBC
HCT: 37.3 % (ref 36.0–46.0)
Hemoglobin: 12.6 g/dL (ref 12.0–15.0)
MCHC: 33.6 g/dL (ref 30.0–36.0)
MCV: 100.9 fl — AB (ref 78.0–100.0)
Platelets: 248 10*3/uL (ref 150.0–400.0)
RBC: 3.7 Mil/uL — ABNORMAL LOW (ref 3.87–5.11)
RDW: 15.8 % — ABNORMAL HIGH (ref 11.5–15.5)
WBC: 7.4 10*3/uL (ref 4.0–10.5)

## 2014-10-16 MED ORDER — ESCITALOPRAM OXALATE 10 MG PO TABS: 10.0000 mg | ORAL_TABLET | Freq: Every day | ORAL | Status: DC

## 2014-10-16 NOTE — Progress Notes (Signed)
Pre visit review using our clinic review tool, if applicable. No additional management support is needed unless otherwise documented below in the visit note. 

## 2014-10-16 NOTE — Patient Instructions (Signed)
Please go to the lab for blood work. I will call you with your results. Please stay well hydrated and continue well-balanced diet. Please start the Lexapro daily as directed. You will be contacted regarding the wheelchair.  Follow-up with Dr. Beverely Lowabori in 3 weeks.

## 2014-10-16 NOTE — Progress Notes (Signed)
Patient presents to clinic today c/o generalized anxiety that has been worsening over the past few months.  Patient is worried about her worsening dementia and worried about falling at home despite using her walker regularly.  Denies panic attack or shortness of breath but notes her chest gets tight when she is really worried. Endorses long-standing history of anxiety that family confirms. Denies depressed mood but family disagrees. Denies SI/HI. Is taking medications as directed.  Patient also requesting LW wheelchair for mother due to mobility issues and instability with her walker. Patient significantly deconditioning so operating a regular weight wheelchair will be too difficult for her and for some of her caretakers.   Past Medical History  Diagnosis Date  . Diverticulitis   . Hypertension   . Osteoporosis   . CAD (coronary artery disease)     Current Outpatient Prescriptions on File Prior to Visit  Medication Sig Dispense Refill  . alendronate (FOSAMAX) 70 MG tablet TAKE 1 TABLET BY MOUTH EVERY 7 DAYS. TAKE WITH A FULL GLASS OF WATER ON AN EMPTY STOMACH 12 tablet 1  . amiodarone (PACERONE) 200 MG tablet TAKE 1 TABLET (200 MG TOTAL) BY MOUTH DAILY. 30 tablet 3  . aspirin EC 325 MG tablet Take 325 mg by mouth every evening.     Marland Kitchen atorvastatin (LIPITOR) 40 MG tablet TAKE 1 TABLET BY MOUTH DAILY AT 6 PM 90 tablet 0  . desloratadine (CLARINEX) 5 MG tablet Take 1 tablet (5 mg total) by mouth daily. 15 tablet 0  . diclofenac sodium (VOLTAREN) 1 % GEL Apply 4 g topically 4 (four) times daily. 100 g 3  . fluticasone (FLONASE) 50 MCG/ACT nasal spray Place 2 sprays into both nostrils daily. 16 g 6  . folic acid (FOLVITE) 1 MG tablet TAKE 1 TABLET (1 MG TOTAL) BY MOUTH DAILY. 90 tablet 1  . hydrochlorothiazide (MICROZIDE) 12.5 MG capsule Take 1 capsule (12.5 mg total) by mouth daily. 90 capsule 3  . lisinopril (PRINIVIL,ZESTRIL) 40 MG tablet Take 1 tablet (40 mg total) by mouth daily. 90 tablet  3  . memantine (NAMENDA) 10 MG tablet Take 1 tablet (10 mg total) by mouth 2 (two) times daily. 60 tablet 3  . metoprolol tartrate (LOPRESSOR) 25 MG tablet Take 0.5 tablets (12.5 mg total) by mouth 2 (two) times daily. 90 tablet 1  . Multiple Vitamin (MULTIVITAMIN) tablet Take 1 tablet by mouth daily.     No current facility-administered medications on file prior to visit.    No Known Allergies  Family History  Problem Relation Age of Onset  . Hypertension Other   . Cancer Mother     stomach  . Diabetes Neg Hx   . Heart disease Neg Hx   . Stroke Neg Hx     History   Social History  . Marital Status: Widowed    Spouse Name: N/A  . Number of Children: 2  . Years of Education: 13+   Occupational History  . Retired    Social History Main Topics  . Smoking status: Former Smoker    Types: Cigarettes  . Smokeless tobacco: Never Used  . Alcohol Use: No     Comment: Occasional  . Drug Use: No  . Sexual Activity: No   Other Topics Concern  . None   Social History Narrative   Regular exercise-no   Caffeine Use-yes   Review of Systems - See HPI.  All other ROS are negative.  BP 108/94 mmHg  Pulse 52  Temp(Src) 98.3 F (36.8 C) (Oral)  Ht 5\' 6"  (1.676 m)  Wt 106 lb 6.4 oz (48.263 kg)  BMI 17.18 kg/m2  SpO2 100%  Physical Exam  Constitutional: She is well-developed, well-nourished, and in no distress.  HENT:  Head: Normocephalic and atraumatic.  Eyes: Conjunctivae are normal.  Neck: Neck supple.  Cardiovascular: Normal rate, regular rhythm, normal heart sounds and intact distal pulses.   Pulmonary/Chest: Effort normal and breath sounds normal. No respiratory distress. She has no wheezes. She has no rales. She exhibits no tenderness.  Neurological: She is alert.  Psychiatric: Her mood appears anxious. She is not agitated. She does not express impulsivity. She expresses no homicidal and no suicidal ideation. She expresses no homicidal plans. She exhibits abnormal  recent memory and abnormal remote memory. She exhibits ordered thought content.  Vitals reviewed.   Recent Results (from the past 2160 hour(s))  Basic Metabolic Panel (BMET)     Status: Abnormal   Collection Time: 09/19/14 11:19 AM  Result Value Ref Range   Sodium 133 (L) 135 - 145 mEq/L   Potassium 4.2 3.5 - 5.1 mEq/L   Chloride 100 96 - 112 mEq/L   CO2 30 19 - 32 mEq/L   Glucose, Bld 118 (H) 70 - 99 mg/dL   BUN 13 6 - 23 mg/dL   Creatinine, Ser 1.91 0.40 - 1.20 mg/dL   Calcium 9.2 8.4 - 47.8 mg/dL   GFR 29.56 >21.30 mL/min  TSH     Status: None   Collection Time: 09/19/14 11:19 AM  Result Value Ref Range   TSH 1.90 0.35 - 4.50 uIU/mL  Hepatic function panel     Status: None   Collection Time: 09/19/14 11:19 AM  Result Value Ref Range   Total Bilirubin 0.6 0.2 - 1.2 mg/dL   Bilirubin, Direct 0.2 0.0 - 0.3 mg/dL   Alkaline Phosphatase 85 39 - 117 U/L   AST 32 0 - 37 U/L   ALT 33 0 - 35 U/L   Total Protein 6.8 6.0 - 8.3 g/dL   Albumin 3.6 3.5 - 5.2 g/dL  CBC     Status: Abnormal   Collection Time: 10/16/14 11:05 AM  Result Value Ref Range   WBC 7.4 4.0 - 10.5 K/uL   RBC 3.70 (L) 3.87 - 5.11 Mil/uL   Platelets 248.0 150.0 - 400.0 K/uL   Hemoglobin 12.6 12.0 - 15.0 g/dL   HCT 86.5 78.4 - 69.6 %   MCV 100.9 (H) 78.0 - 100.0 fl   MCHC 33.6 30.0 - 36.0 g/dL   RDW 29.5 (H) 28.4 - 13.2 %  Basic Metabolic Panel (BMET)     Status: Abnormal   Collection Time: 10/16/14 11:05 AM  Result Value Ref Range   Sodium 126 (L) 135 - 145 mEq/L   Potassium 3.8 3.5 - 5.1 mEq/L   Chloride 92 (L) 96 - 112 mEq/L   CO2 26 19 - 32 mEq/L   Glucose, Bld 108 (H) 70 - 99 mg/dL   BUN 13 6 - 23 mg/dL   Creatinine, Ser 4.40 0.40 - 1.20 mg/dL   Calcium 9.0 8.4 - 10.2 mg/dL   GFR 72.53 >66.44 mL/min    Assessment/Plan: Depression With anxiety stemming from dementia and loss of control in her life. Discussed treatment options with patient and her family. Will begin small dose of Lexapro 10 mg  daily to help [facility] as a good outlet for patient and family. Will give caregivers a small break  and will allow patient to interact with others as social interactions are good for cognition. Will also hopefully help with mood. [facility]. Will follow-up in 3 weeks to assess.    Severe muscle deconditioning Multifactorial. Will order LW wheelchair as patient's mobility needs presently cannot be aided with cane or walker. Patient is too weak to help propel a regular weight wheelchair. Wheelchair will need a cushioned seat to help reduce pressure to the sacrum. Will also place referral to home health for skilled RN assessment periodically, as well as to train family in safety and to remove fall risks.

## 2014-10-17 ENCOUNTER — Telehealth: Payer: Self-pay | Admitting: *Deleted

## 2014-10-17 NOTE — Telephone Encounter (Signed)
Spoke with the pt's son and informed him of the pt's recent lab results and note.  The son verbalized understanding and agreed.  Pt was scheduled an appt for Friday 10/19/14 @ 10:30am.//AB/CMA

## 2014-10-17 NOTE — Telephone Encounter (Signed)
-----   Message from Waldon MerlWilliam C Martin, PA-C sent at 10/16/2014  2:44 PM EDT ----- Labs good overall. Urine is pending. Her sodium is low -- want her to increase salt intake. Hold hydrochlorothiazide for the next 2 days. Follow-up Friday for repeat BP and repeat BMP.

## 2014-10-18 DIAGNOSIS — F329 Major depressive disorder, single episode, unspecified: Secondary | ICD-10-CM | POA: Insufficient documentation

## 2014-10-18 DIAGNOSIS — R29898 Other symptoms and signs involving the musculoskeletal system: Secondary | ICD-10-CM | POA: Insufficient documentation

## 2014-10-18 DIAGNOSIS — F32A Depression, unspecified: Secondary | ICD-10-CM | POA: Insufficient documentation

## 2014-10-18 NOTE — Assessment & Plan Note (Signed)
Multifactorial. Will order LW wheelchair as patient's mobility needs presently cannot be aided with cane or walker. Patient is too weak to help propel a regular weight wheelchair. Wheelchair will need a cushioned seat to help reduce pressure to the sacrum. Will also place referral to home health for skilled RN assessment periodically, as well as to train family in safety and to remove fall risks.

## 2014-10-18 NOTE — Assessment & Plan Note (Addendum)
With anxiety stemming from dementia and loss of control in her life. Discussed treatment options with patient and her family. Will begin small dose of Lexapro 10 mg daily to help [facility] as a good outlet for patient and family. Will give caregivers a small break and will allow patient to interact with others as social interactions are good for cognition. Will also hopefully help with mood. [facility]. Will follow-up in 3 weeks to assess.

## 2014-10-19 ENCOUNTER — Telehealth: Payer: Self-pay | Admitting: Physician Assistant

## 2014-10-19 ENCOUNTER — Ambulatory Visit: Payer: Commercial Managed Care - HMO | Admitting: Physician Assistant

## 2014-10-19 NOTE — Telephone Encounter (Signed)
Please call patient's son -- has scheduled follow-up for Blood pressure and hyponatremia. Son called in to cancel appointment as patient not feeling well. Will you please make sure she is ok?  Also they need to be seen ASAP next week to recheck sodium and BP.

## 2014-10-19 NOTE — Telephone Encounter (Signed)
We have done all we can. Will see Monday. ER if worsening.
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
## 2014-10-20 ENCOUNTER — Inpatient Hospital Stay (HOSPITAL_COMMUNITY): Payer: Commercial Managed Care - HMO

## 2014-10-20 ENCOUNTER — Encounter (HOSPITAL_COMMUNITY): Payer: Self-pay

## 2014-10-20 ENCOUNTER — Inpatient Hospital Stay (HOSPITAL_COMMUNITY)
Admission: EM | Admit: 2014-10-20 | Discharge: 2014-10-23 | DRG: 640 | Disposition: A | Discharge status: Skilled Nursing Facility | Payer: Commercial Managed Care - HMO | Attending: Internal Medicine | Admitting: Internal Medicine

## 2014-10-20 DIAGNOSIS — K5900 Constipation, unspecified: Secondary | ICD-10-CM

## 2014-10-20 DIAGNOSIS — I480 Paroxysmal atrial fibrillation: Secondary | ICD-10-CM | POA: Diagnosis present

## 2014-10-20 DIAGNOSIS — F0390 Unspecified dementia without behavioral disturbance: Secondary | ICD-10-CM | POA: Diagnosis present

## 2014-10-20 DIAGNOSIS — E876 Hypokalemia: Secondary | ICD-10-CM | POA: Diagnosis present

## 2014-10-20 DIAGNOSIS — Z681 Body mass index (BMI) 19 or less, adult: Secondary | ICD-10-CM | POA: Diagnosis not present

## 2014-10-20 DIAGNOSIS — Z8249 Family history of ischemic heart disease and other diseases of the circulatory system: Secondary | ICD-10-CM | POA: Diagnosis not present

## 2014-10-20 DIAGNOSIS — F32A Depression, unspecified: Secondary | ICD-10-CM | POA: Diagnosis present

## 2014-10-20 DIAGNOSIS — I10 Essential (primary) hypertension: Secondary | ICD-10-CM | POA: Diagnosis present

## 2014-10-20 DIAGNOSIS — R627 Adult failure to thrive: Secondary | ICD-10-CM | POA: Diagnosis present

## 2014-10-20 DIAGNOSIS — E860 Dehydration: Secondary | ICD-10-CM | POA: Diagnosis present

## 2014-10-20 DIAGNOSIS — Z66 Do not resuscitate: Secondary | ICD-10-CM | POA: Diagnosis present

## 2014-10-20 DIAGNOSIS — E43 Unspecified severe protein-calorie malnutrition: Secondary | ICD-10-CM | POA: Diagnosis present

## 2014-10-20 DIAGNOSIS — F0391 Unspecified dementia with behavioral disturbance: Secondary | ICD-10-CM

## 2014-10-20 DIAGNOSIS — R531 Weakness: Secondary | ICD-10-CM | POA: Diagnosis present

## 2014-10-20 DIAGNOSIS — F0150 Vascular dementia without behavioral disturbance: Secondary | ICD-10-CM | POA: Diagnosis not present

## 2014-10-20 DIAGNOSIS — Z87891 Personal history of nicotine dependence: Secondary | ICD-10-CM | POA: Diagnosis not present

## 2014-10-20 DIAGNOSIS — L8990 Pressure ulcer of unspecified site, unspecified stage: Secondary | ICD-10-CM | POA: Insufficient documentation

## 2014-10-20 DIAGNOSIS — E46 Unspecified protein-calorie malnutrition: Secondary | ICD-10-CM | POA: Diagnosis present

## 2014-10-20 DIAGNOSIS — R0600 Dyspnea, unspecified: Secondary | ICD-10-CM

## 2014-10-20 DIAGNOSIS — R0602 Shortness of breath: Secondary | ICD-10-CM | POA: Diagnosis present

## 2014-10-20 DIAGNOSIS — I482 Chronic atrial fibrillation: Secondary | ICD-10-CM | POA: Diagnosis not present

## 2014-10-20 DIAGNOSIS — F411 Generalized anxiety disorder: Secondary | ICD-10-CM | POA: Diagnosis present

## 2014-10-20 DIAGNOSIS — Z951 Presence of aortocoronary bypass graft: Secondary | ICD-10-CM

## 2014-10-20 DIAGNOSIS — F329 Major depressive disorder, single episode, unspecified: Secondary | ICD-10-CM | POA: Diagnosis present

## 2014-10-20 DIAGNOSIS — E871 Hypo-osmolality and hyponatremia: Secondary | ICD-10-CM | POA: Diagnosis present

## 2014-10-20 DIAGNOSIS — I2510 Atherosclerotic heart disease of native coronary artery without angina pectoris: Secondary | ICD-10-CM | POA: Diagnosis present

## 2014-10-20 DIAGNOSIS — M810 Age-related osteoporosis without current pathological fracture: Secondary | ICD-10-CM | POA: Diagnosis present

## 2014-10-20 LAB — CBC WITH DIFFERENTIAL/PLATELET
BASOS ABS: 0 10*3/uL (ref 0.0–0.1)
BASOS PCT: 0 % (ref 0–1)
EOS PCT: 0 % (ref 0–5)
Eosinophils Absolute: 0 10*3/uL (ref 0.0–0.7)
HEMATOCRIT: 37.7 % (ref 36.0–46.0)
Hemoglobin: 13 g/dL (ref 12.0–15.0)
Lymphocytes Relative: 9 % — ABNORMAL LOW (ref 12–46)
Lymphs Abs: 0.5 10*3/uL — ABNORMAL LOW (ref 0.7–4.0)
MCH: 33.2 pg (ref 26.0–34.0)
MCHC: 34.5 g/dL (ref 30.0–36.0)
MCV: 96.4 fL (ref 78.0–100.0)
MONO ABS: 0.5 10*3/uL (ref 0.1–1.0)
Monocytes Relative: 8 % (ref 3–12)
Neutro Abs: 4.9 10*3/uL (ref 1.7–7.7)
Neutrophils Relative %: 83 % — ABNORMAL HIGH (ref 43–77)
Platelets: 265 10*3/uL (ref 150–400)
RBC: 3.91 MIL/uL (ref 3.87–5.11)
RDW: 14.2 % (ref 11.5–15.5)
WBC: 6 10*3/uL (ref 4.0–10.5)

## 2014-10-20 LAB — COMPREHENSIVE METABOLIC PANEL
ALBUMIN: 3.8 g/dL (ref 3.5–5.0)
ALT: 81 U/L — ABNORMAL HIGH (ref 14–54)
ANION GAP: 10 (ref 5–15)
AST: 62 U/L — ABNORMAL HIGH (ref 15–41)
Alkaline Phosphatase: 93 U/L (ref 38–126)
BILIRUBIN TOTAL: 0.9 mg/dL (ref 0.3–1.2)
BUN: 13 mg/dL (ref 6–20)
CO2: 24 mmol/L (ref 22–32)
CREATININE: 0.59 mg/dL (ref 0.44–1.00)
Calcium: 9.1 mg/dL (ref 8.9–10.3)
Chloride: 87 mmol/L — ABNORMAL LOW (ref 101–111)
GFR calc Af Amer: >60 mL/min (ref >60)
GFR calc non Af Amer: >60 mL/min (ref >60)
Glucose, Bld: 128 mg/dL — ABNORMAL HIGH (ref 65–99)
Potassium: 3.2 mmol/L — ABNORMAL LOW (ref 3.5–5.1)
Sodium: 121 mmol/L — ABNORMAL LOW (ref 135–145)
TOTAL PROTEIN: 6.9 g/dL (ref 6.5–8.1)

## 2014-10-20 LAB — BASIC METABOLIC PANEL
Anion gap: 9 (ref 5–15)
Anion gap: 6 (ref 5–15)
BUN: 10 mg/dL (ref 6–20)
BUN: 10 mg/dL (ref 6–20)
CALCIUM: 8.2 mg/dL — AB (ref 8.9–10.3)
CHLORIDE: 93 mmol/L — AB (ref 101–111)
CO2: 25 mmol/L (ref 22–32)
CO2: 24 mmol/L (ref 22–32)
Calcium: 8.6 mg/dL — ABNORMAL LOW (ref 8.9–10.3)
Chloride: 97 mmol/L — ABNORMAL LOW (ref 101–111)
Creatinine, Ser: 0.47 mg/dL (ref 0.44–1.00)
Creatinine, Ser: 0.49 mg/dL (ref 0.44–1.00)
GFR calc Af Amer: >60 mL/min (ref >60)
GFR calc non Af Amer: >60 mL/min (ref >60)
GLUCOSE: 107 mg/dL — AB (ref 65–99)
GLUCOSE: 96 mg/dL (ref 65–99)
Potassium: 3.3 mmol/L — ABNORMAL LOW (ref 3.5–5.1)
Potassium: 3.2 mmol/L — ABNORMAL LOW (ref 3.5–5.1)
Sodium: 127 mmol/L — ABNORMAL LOW (ref 135–145)
Sodium: 127 mmol/L — ABNORMAL LOW (ref 135–145)

## 2014-10-20 LAB — URINALYSIS, ROUTINE W REFLEX MICROSCOPIC
BILIRUBIN URINE: NEGATIVE
Glucose, UA: NEGATIVE mg/dL
Hgb urine dipstick: NEGATIVE
Ketones, ur: NEGATIVE mg/dL
Leukocytes, UA: NEGATIVE
Nitrite: NEGATIVE
PROTEIN: NEGATIVE mg/dL
Specific Gravity, Urine: 1.007 (ref 1.005–1.030)
UROBILINOGEN UA: 0.2 mg/dL (ref 0.0–1.0)
pH: 7 (ref 5.0–8.0)

## 2014-10-20 LAB — TSH: TSH: 1.265 u[IU]/mL (ref 0.350–4.500)

## 2014-10-20 LAB — CORTISOL-AM, BLOOD: CORTISOL - AM: 16.4 ug/dL (ref 6.7–22.6)

## 2014-10-20 LAB — LIPASE, BLOOD: LIPASE: 57 U/L — AB (ref 22–51)

## 2014-10-20 LAB — SODIUM, URINE, RANDOM: Sodium, Ur: 65 mmol/L

## 2014-10-20 MED ORDER — SODIUM CHLORIDE 0.9 % IJ SOLN
3.0000 mL | Freq: Two times a day (BID) | INTRAMUSCULAR | Status: DC
  Administered 2014-10-21: 3 mL via INTRAVENOUS

## 2014-10-20 MED ORDER — ENOXAPARIN SODIUM 40 MG/0.4ML ~~LOC~~ SOLN
40.0000 mg | SUBCUTANEOUS | Status: DC
  Administered 2014-10-20 – 2014-10-22 (×3): 40 mg via SUBCUTANEOUS
  Filled 2014-10-20 (×4): qty 0.4

## 2014-10-20 MED ORDER — AMIODARONE HCL 200 MG PO TABS
200.0000 mg | ORAL_TABLET | Freq: Every day | ORAL | Status: DC
  Administered 2014-10-21 – 2014-10-23 (×3): 200 mg via ORAL
  Filled 2014-10-20 (×3): qty 1

## 2014-10-20 MED ORDER — ASPIRIN EC 325 MG PO TBEC
325.0000 mg | DELAYED_RELEASE_TABLET | Freq: Every evening | ORAL | Status: DC
  Administered 2014-10-21 – 2014-10-22 (×2): 325 mg via ORAL
  Filled 2014-10-20 (×4): qty 1

## 2014-10-20 MED ORDER — ONE-DAILY MULTI VITAMINS PO TABS
0.5000 | ORAL_TABLET | Freq: Two times a day (BID) | ORAL | Status: DC
  Administered 2014-10-20 – 2014-10-23 (×6): 0.5 via ORAL
  Filled 2014-10-20 (×8): qty 1

## 2014-10-20 MED ORDER — POTASSIUM CHLORIDE CRYS ER 20 MEQ PO TBCR
40.0000 meq | EXTENDED_RELEASE_TABLET | Freq: Once | ORAL | Status: AC
  Administered 2014-10-20: 40 meq via ORAL
  Filled 2014-10-20: qty 2

## 2014-10-20 MED ORDER — ALPRAZOLAM 0.25 MG PO TABS
0.1250 mg | ORAL_TABLET | Freq: Two times a day (BID) | ORAL | Status: DC | PRN
  Administered 2014-10-20 – 2014-10-23 (×3): 0.125 mg via ORAL
  Filled 2014-10-20 (×3): qty 1

## 2014-10-20 MED ORDER — TRAMADOL HCL 50 MG PO TABS: 50.0000 mg | ORAL_TABLET | Freq: Three times a day (TID) | ORAL | Status: DC | PRN

## 2014-10-20 MED ORDER — DICLOFENAC SODIUM 1 % TD GEL
4.0000 g | Freq: Four times a day (QID) | TRANSDERMAL | Status: DC
  Administered 2014-10-22: 4 g via TOPICAL
  Filled 2014-10-20: qty 100

## 2014-10-20 MED ORDER — LISINOPRIL 40 MG PO TABS
40.0000 mg | ORAL_TABLET | Freq: Every day | ORAL | Status: DC
  Administered 2014-10-21 – 2014-10-23 (×3): 40 mg via ORAL
  Filled 2014-10-20 (×3): qty 1

## 2014-10-20 MED ORDER — LORATADINE 10 MG PO TABS
10.0000 mg | ORAL_TABLET | Freq: Every day | ORAL | Status: DC
  Administered 2014-10-21 – 2014-10-23 (×3): 10 mg via ORAL
  Filled 2014-10-20 (×3): qty 1

## 2014-10-20 MED ORDER — MEMANTINE HCL 10 MG PO TABS
10.0000 mg | ORAL_TABLET | Freq: Two times a day (BID) | ORAL | Status: DC
  Administered 2014-10-20 – 2014-10-23 (×6): 10 mg via ORAL
  Filled 2014-10-20 (×7): qty 1

## 2014-10-20 MED ORDER — ENSURE ENLIVE PO LIQD
237.0000 mL | Freq: Two times a day (BID) | ORAL | Status: DC
  Administered 2014-10-20 – 2014-10-23 (×5): 237 mL via ORAL

## 2014-10-20 MED ORDER — SODIUM CHLORIDE 0.9 % IV BOLUS (SEPSIS)
1000.0000 mL | Freq: Once | INTRAVENOUS | Status: AC
  Administered 2014-10-20: 1000 mL via INTRAVENOUS

## 2014-10-20 MED ORDER — ESCITALOPRAM OXALATE 10 MG PO TABS
10.0000 mg | ORAL_TABLET | Freq: Every day | ORAL | Status: DC
  Administered 2014-10-21 – 2014-10-22 (×2): 10 mg via ORAL
  Filled 2014-10-20 (×3): qty 1

## 2014-10-20 MED ORDER — METOPROLOL TARTRATE 25 MG PO TABS
12.5000 mg | ORAL_TABLET | Freq: Two times a day (BID) | ORAL | Status: DC
  Administered 2014-10-20 – 2014-10-23 (×6): 12.5 mg via ORAL
  Filled 2014-10-20 (×6): qty 1

## 2014-10-20 MED ORDER — LATANOPROST 0.005 % OP SOLN
1.0000 [drp] | Freq: Every day | OPHTHALMIC | Status: DC
  Administered 2014-10-20 – 2014-10-22 (×3): 1 [drp] via OPHTHALMIC
  Filled 2014-10-20: qty 2.5

## 2014-10-20 MED ORDER — FOLIC ACID 1 MG PO TABS
1.0000 mg | ORAL_TABLET | Freq: Every day | ORAL | Status: DC
  Administered 2014-10-21 – 2014-10-23 (×3): 1 mg via ORAL
  Filled 2014-10-20 (×3): qty 1

## 2014-10-20 MED ORDER — SODIUM CHLORIDE 0.9 % IV SOLN
INTRAVENOUS | Status: DC
  Administered 2014-10-20 – 2014-10-22 (×4): via INTRAVENOUS

## 2014-10-20 MED ORDER — ATORVASTATIN CALCIUM 40 MG PO TABS
40.0000 mg | ORAL_TABLET | Freq: Every day | ORAL | Status: DC
  Administered 2014-10-20 – 2014-10-22 (×3): 40 mg via ORAL
  Filled 2014-10-20 (×4): qty 1

## 2014-10-20 MED ORDER — HYDRALAZINE HCL 20 MG/ML IJ SOLN
2.0000 mg | Freq: Four times a day (QID) | INTRAMUSCULAR | Status: DC | PRN
Start: 2014-10-20 — End: 2014-10-23

## 2014-10-20 MED ORDER — FLUTICASONE PROPIONATE 50 MCG/ACT NA SUSP
2.0000 | Freq: Every day | NASAL | Status: DC
  Administered 2014-10-21 – 2014-10-23 (×3): 2 via NASAL
  Filled 2014-10-20: qty 16

## 2014-10-20 NOTE — ED Notes (Signed)
unable to collect urine specimen

## 2014-10-20 NOTE — ED Notes (Signed)
Nurse is about to put IV in patient.

## 2014-10-20 NOTE — H&P (Addendum)
History and Physical    [name] GNF:[id number] DOB: October 06, 1928 DOA: 10/20/2014  Referring physician: Dr. Littie Deeds PCP: [name], MD  Specialists: none  Chief Complaint: anxiety, weakness  HPI: Jacqueline [name] is a 81 y.o. female has a past medical history significant for mild dementia, coronary artery disease status post CABG 5, remote paroxysmal A. fib followed by cardiology on no anticoagulation given her frail body habitus and history of dementia, presents to the emergency room with a chief complaint of anxiety as well as weakness. She was recently evaluated in her primary care's office for generalized anxiety worsening over the past few months and was started on Lexapro. She has started the Lexapro 2 days ago. Her son is in the room, patient lives with her son and his wife and provides most of the story. He states that she has been complaining of being "cold" for the past week, she has been progressively weaker and has been more and more worried about everything that may go wrong with her body. She's been having a poor appetite and is barely eating or drinking anything. Son feels that she may be more confused in the past week. He has been checking her vitals signs regularly at home, and there are no documented fevers, her blood pressure has been well-controlled, her heart rate has been within normal limits. Patient denies any abdominal pain, nausea, vomiting. She had one episode of loose stools 2 days ago, resolved since. She endorses being very anxious, and also complains of intermittent dyspnea. She denies any chest pain or palpitations. She denies any lightheadedness or dizziness. At baseline, she does not ambulate much, and in the last week wasn't able to do any of that. In the emergency room, patient is afebrile, her blood pressures in the 150s to 180s systolic, her heart rate is in the upper 50s, she is not tachypnea, and she is satting 100% on room air. Blood work  reveals a sodium 121, worsened from 125 that was done 4 days ago in her PCPs office. She has mild hypokalemia and hypochloremia. TRH was asked for admission for hyponatremia.  Review of Systems: As per history of present illness, otherwise 10 point review of systems negative  Past Medical History  Diagnosis Date  . Diverticulitis   . Hypertension   . Osteoporosis   . CAD (coronary artery disease)    Past Surgical History  Procedure Laterality Date  . Fracture surgery    . Partial colectomy    . Coronary artery bypass graft N/A 08/25/2012    Procedure: CORONARY ARTERY BYPASS GRAFTING ;  Surgeon: Alleen Borne, MD;  Location: [facility] OR;  Service: Open Heart Surgery;  Laterality: N/A;  four bypasses total   . Left heart catheterization with coronary angiogram N/A 08/22/2012    Procedure: LEFT HEART CATHETERIZATION WITH CORONARY ANGIOGRAM;  Surgeon: Peter M Swaziland, MD;  Location: [facility] CATH LAB;  Service: Cardiovascular;  Laterality: N/A;   Social History:  reports that she has quit smoking. Her smoking use included Cigarettes. She has never used smokeless tobacco. She reports that she does not drink alcohol or use illicit drugs.  No Known Allergies  Family History  Problem Relation Age of Onset  . Hypertension Other   . Cancer Mother     stomach  . Diabetes Neg Hx   . Heart disease Neg Hx   . Stroke Neg Hx     Prior to Admission medications   Medication Sig Start Date End Date  Taking? Authorizing Provider  alendronate (FOSAMAX) 70 MG tablet TAKE 1 TABLET BY MOUTH EVERY 7 DAYS. TAKE WITH A FULL GLASS OF WATER ON AN EMPTY STOMACH 08/06/14  Yes Sheliah HatchKatherine E Tabori, MD  amiodarone (PACERONE) 200 MG tablet TAKE 1 TABLET (200 MG TOTAL) BY MOUTH DAILY. 09/03/14  Yes Sheliah HatchKatherine E Tabori, MD  aspirin EC 325 MG tablet Take 325 mg by mouth every evening.    Yes Historical Provider, MD  atorvastatin (LIPITOR) 40 MG tablet TAKE 1 TABLET BY MOUTH DAILY AT 6 PM 09/04/14  Yes Sheliah HatchKatherine E Tabori, MD    desloratadine (CLARINEX) 5 MG tablet Take 1 tablet (5 mg total) by mouth daily. 08/10/13  Yes Gilda Creasehristopher J Pollina, MD  diclofenac sodium (VOLTAREN) 1 % GEL Apply 4 g topically 4 (four) times daily. 08/20/14  Yes Sheliah HatchKatherine E Tabori, MD  escitalopram (LEXAPRO) 10 MG tablet Take 1 tablet (10 mg total) by mouth daily. 10/16/14  Yes Waldon MerlWilliam C Martin, PA-C  fluticasone (FLONASE) 50 MCG/ACT nasal spray Place 2 sprays into both nostrils daily. 10/05/13  Yes Sheliah HatchKatherine E Tabori, MD  folic acid (FOLVITE) 1 MG tablet TAKE 1 TABLET (1 MG TOTAL) BY MOUTH DAILY. 09/24/14  Yes Sheliah HatchKatherine E Tabori, MD  hydrochlorothiazide (MICROZIDE) 12.5 MG capsule Take 1 capsule (12.5 mg total) by mouth daily. 09/11/14  Yes Lewayne BuntingBrian S Crenshaw, MD  lisinopril (PRINIVIL,ZESTRIL) 40 MG tablet Take 1 tablet (40 mg total) by mouth daily. 09/11/14  Yes Lewayne BuntingBrian S Crenshaw, MD  LUMIGAN 0.01 % SOLN PLACE 1 DROP INTO BOTH EYES AT BEDTIME. 08/29/14  Yes Historical Provider, MD  memantine (NAMENDA) 10 MG tablet Take 1 tablet (10 mg total) by mouth 2 (two) times daily. 09/19/14  Yes Drema DallasAdam R Jaffe, DO  metoprolol tartrate (LOPRESSOR) 25 MG tablet Take 0.5 tablets (12.5 mg total) by mouth 2 (two) times daily. 09/21/14  Yes Sheliah HatchKatherine E Tabori, MD  Multiple Vitamin (MULTIVITAMIN) tablet Take 0.5 tablets by mouth 2 (two) times daily.    Yes Historical Provider, MD  traMADol (ULTRAM) 50 MG tablet Take 50 mg by mouth every 8 (eight) hours as needed for moderate pain.  09/20/14  Yes Historical Provider, MD   Physical Exam: Filed Vitals:   10/20/14 1102 10/20/14 1119 10/20/14 1350  BP: 159/65  188/80  Pulse: 59  55  Temp: 98.8 F (37.1 C)  98.8 F (37.1 C)  TempSrc: Oral  Oral  Resp: 18  18  Height:  5\' 6"  (1.676 m)   Weight:  48.081 kg (106 lb)   SpO2: 99%  100%     General:  No apparent distress, anxious appearing  Eyes: PERRL, EOMI, no scleral icterus  ENT: dry oropharynx  Neck: supple, no lymphadenopathy  Cardiovascular: regular rate without  MRG; 2+ peripheral pulses, no JVD, no peripheral edema  Respiratory: CTA biL, good air movement without wheezing, rhonchi or crackled  Abdomen: soft, non tender to palpation, positive bowel sounds, no guarding, no rebound  Skin: no rashes  Musculoskeletal: decreased bulk, normal tone, no joint swelling  Psychiatric: very anxious  Neurologic: CN 2-12 grossly intact, MS 5/5 in all 4  Labs on Admission:  Basic Metabolic Panel:  Recent Labs Lab 10/16/14 1105 10/20/14 1145  NA 126* 121*  K 3.8 3.2*  CL 92* 87*  CO2 26 24  GLUCOSE 108* 128*  BUN 13 13  CREATININE 0.69 0.59  CALCIUM 9.0 9.1   Liver Function Tests:  Recent Labs Lab 10/20/14 1145  AST 62*  ALT 81*  ALKPHOS 93  BILITOT 0.9  PROT 6.9  ALBUMIN 3.8    Recent Labs Lab 10/20/14 1145  LIPASE 57*   CBC:  Recent Labs Lab 10/16/14 1105 10/20/14 1145  WBC 7.4 6.0  NEUTROABS  --  4.9  HGB 12.6 13.0  HCT 37.3 37.7  MCV 100.9* 96.4  PLT 248.0 265   BNP (last 3 results)  Recent Labs  07/19/14 1818  BNP 191.0*   EKG: Independently reviewed. SINUS RHYTHM, BORDERLINE RIGHT BUNDLE BRANCH BLOCK   Assessment/Plan Principal Problem:   Hyponatremia Active Problems:   HTN (hypertension)   CAD (coronary artery disease)   Atrial fibrillation   SOB (shortness of breath)   Protein calorie malnutrition   Mixed vascular and neurodegenerative dementia   Depression    Hyponatremia - This is likely in the setting of dehydration. Patient is on Lexapro, which can cause hyponatremia, however she was beginning to be hyponatremic before she started the Lexapro, and has only been taking this for 2 days, which is unlikely to contribute. Plan to continue the Lexapro for now, if her sodium is not correcting with hydration or SIADH is suspected based on labs this will probably need to be stopped  - Obtain serum osmolality, urine osmolality, urine sodium  - Gentle hydration with normal saline, repeat BMP every 6  hours for one day  - We'll check TSH and a.m. cortisol   Generalized anxiety - Patient visibly anxious in the ED - Continue Lexapro - Xanax as needed. Son tells me that when she got Xanax in the past she has been lethargic and "loopy" after that, however patient and son want to try again very low dose. We'll provide 0.125 twice a day as needed - Patient reports dyspnea as well, we'll start with a 2 view chest x-ray to investigate that further for organic causes  Hypertension - To resume her home medications - IV hydralazine as needed for systolic blood pressure greater than 180 - She tends to be hypertensive as an outpatient, when she saw cardiology in May she was 218/108 in office  Mild dementia - Resume her home medication  Coronary artery disease - Patient without chest pain, palpitations - Monitor on telemetry  Protein calorie malnutrition - nutrition consult  - Nutritional supplements   Hypokalemia - Likely in the setting of poor by mouth intake  - Replete and repeat BMP in the morning   Mild LFT elevation - Her AST and ALT are borderline elevated, as well as her lipase. She has no GI symptoms and abdominal exam is fairly benign. - closely monitor, if they get worse may need further imaging  PAF - followed by cardiology as an outpatient, felt not to be an anticoagulation candidate, continue her home medications - CHA2DS2-VASc Score 4 - currently in sinus rhythm, monitor on telemetry    Diet: regular Fluids: NS DVT Prophylaxis: Lovenox  Code Status: DNR  Family Communication: d/w son bedside  Disposition Plan: admit to telemetry   Time spent: 86  Correll Denbow M. Elvera Lennox, MD [facility] Pager [phone]  If 7PM-7AM, please contact night-coverage [email or web address] Password [email or web address] 10/20/2014, 2:01 PM

## 2014-10-20 NOTE — ED Notes (Signed)
MD at bedside. 

## 2014-10-20 NOTE — ED Notes (Signed)
Spoke with Darl PikesSusan re: time to floor @ [phone]

## 2014-10-20 NOTE — ED Notes (Signed)
MD at bedside. ADMITTING MD PRESENT 

## 2014-10-20 NOTE — ED Notes (Signed)
NOT SUCCESS WITH ATTEMPT MADE FOR URINE SAMPLE

## 2014-10-20 NOTE — ED Provider Notes (Signed)
CSN: [id number]     Arrival date & time 10/20/14  1048 History   First MD Initiated Contact with Patient 10/20/14 1057     Chief Complaint  Patient presents with  . Panic Attack  . Weakness  . Nausea  . Emesis     (Consider location/radiation/quality/duration/timing/severity/associated sxs/prior Treatment) Patient is a 81 y.o. female presenting with general illness.  Illness Location:  Generalized Quality:  Weakness, feeling of helplessness, nausea, anorexia Severity:  Moderate Onset quality:  Gradual Timing:  Constant Progression:  Worsening Chronicity:  Chronic Context:  Seen by PCP for same 4 days ago, wu with hyponatremia, also dx with anxiety Relieved by:  Nothing Worsened by:  Nothing Associated symptoms: diarrhea (in the past)   Associated symptoms: no abdominal pain     Past Medical History  Diagnosis Date  . Diverticulitis   . Hypertension   . Osteoporosis   . CAD (coronary artery disease)    Past Surgical History  Procedure Laterality Date  . Fracture surgery    . Partial colectomy    . Coronary artery bypass graft N/A 08/25/2012    Procedure: CORONARY ARTERY BYPASS GRAFTING ;  Surgeon: Alleen Borne, MD;  Location: [facility] OR;  Service: Open Heart Surgery;  Laterality: N/A;  four bypasses total   . Left heart catheterization with coronary angiogram N/A 08/22/2012    Procedure: LEFT HEART CATHETERIZATION WITH CORONARY ANGIOGRAM;  Surgeon: Peter M Swaziland, MD;  Location: [facility] CATH LAB;  Service: Cardiovascular;  Laterality: N/A;   Family History  Problem Relation Age of Onset  . Hypertension Other   . Cancer Mother     stomach  . Diabetes Neg Hx   . Heart disease Neg Hx   . Stroke Neg Hx    History  Substance Use Topics  . Smoking status: Former Smoker    Types: Cigarettes  . Smokeless tobacco: Never Used  . Alcohol Use: No     Comment: Occasional   OB History    No data available     Review of Systems  Gastrointestinal: Positive for diarrhea (in the  past). Negative for abdominal pain.  All other systems reviewed and are negative.     Allergies  Review of patient's allergies indicates no known allergies.  Home Medications   Prior to Admission medications   Medication Sig Start Date End Date Taking? Authorizing Provider  alendronate (FOSAMAX) 70 MG tablet TAKE 1 TABLET BY MOUTH EVERY 7 DAYS. TAKE WITH A FULL GLASS OF WATER ON AN EMPTY STOMACH 08/06/14  Yes Sheliah Hatch, MD  amiodarone (PACERONE) 200 MG tablet TAKE 1 TABLET (200 MG TOTAL) BY MOUTH DAILY. 09/03/14  Yes Sheliah Hatch, MD  aspirin EC 325 MG tablet Take 325 mg by mouth every evening.    Yes Historical Provider, MD  atorvastatin (LIPITOR) 40 MG tablet TAKE 1 TABLET BY MOUTH DAILY AT 6 PM 09/04/14  Yes Sheliah Hatch, MD  desloratadine (CLARINEX) 5 MG tablet Take 1 tablet (5 mg total) by mouth daily. 08/10/13  Yes Gilda Crease, MD  diclofenac sodium (VOLTAREN) 1 % GEL Apply 4 g topically 4 (four) times daily. 08/20/14  Yes Sheliah Hatch, MD  escitalopram (LEXAPRO) 10 MG tablet Take 1 tablet (10 mg total) by mouth daily. 10/16/14  Yes Waldon Merl, PA-C  fluticasone (FLONASE) 50 MCG/ACT nasal spray Place 2 sprays into both nostrils daily. 10/05/13  Yes Sheliah Hatch, MD  folic acid (FOLVITE) 1 MG tablet TAKE  1 TABLET (1 MG TOTAL) BY MOUTH DAILY. 09/24/14  Yes Sheliah HatchKatherine E Tabori, MD  hydrochlorothiazide (MICROZIDE) 12.5 MG capsule Take 1 capsule (12.5 mg total) by mouth daily. 09/11/14  Yes Lewayne BuntingBrian S Crenshaw, MD  lisinopril (PRINIVIL,ZESTRIL) 40 MG tablet Take 1 tablet (40 mg total) by mouth daily. 09/11/14  Yes Lewayne BuntingBrian S Crenshaw, MD  LUMIGAN 0.01 % SOLN PLACE 1 DROP INTO BOTH EYES AT BEDTIME. 08/29/14  Yes Historical Provider, MD  memantine (NAMENDA) 10 MG tablet Take 1 tablet (10 mg total) by mouth 2 (two) times daily. 09/19/14  Yes Drema DallasAdam R Jaffe, DO  metoprolol tartrate (LOPRESSOR) 25 MG tablet Take 0.5 tablets (12.5 mg total) by mouth 2 (two) times daily.  09/21/14  Yes Sheliah HatchKatherine E Tabori, MD  Multiple Vitamin (MULTIVITAMIN) tablet Take 0.5 tablets by mouth 2 (two) times daily.    Yes Historical Provider, MD  traMADol (ULTRAM) 50 MG tablet Take 50 mg by mouth every 8 (eight) hours as needed for moderate pain.  09/20/14  Yes Historical Provider, MD   BP 103/51 mmHg  Pulse 63  Temp(Src) 98.6 F (37 C) (Oral)  Resp 18  Ht 5\' 6"  (1.676 m)  Wt 109 lb 2 oz (49.5 kg)  BMI 17.62 kg/m2  SpO2 100% Physical Exam  Constitutional: She is oriented to person, place, and time. She appears well-developed and well-nourished.  HENT:  Head: Normocephalic and atraumatic.  Right Ear: External ear normal.  Left Ear: External ear normal.  Eyes: Conjunctivae and EOM are normal. Pupils are equal, round, and reactive to light.  Neck: Normal range of motion. Neck supple.  Cardiovascular: Normal rate, regular rhythm, normal heart sounds and intact distal pulses.   Pulmonary/Chest: Effort normal and breath sounds normal.  Abdominal: Soft. Bowel sounds are normal. There is no tenderness.  Musculoskeletal: Normal range of motion.  Neurological: She is alert and oriented to person, place, and time.  Skin: Skin is warm and dry.  Vitals reviewed.   ED Course  Procedures (including critical care time) Labs Review Labs Reviewed  COMPREHENSIVE METABOLIC PANEL - Abnormal; Notable for the following:    Sodium 121 (*)    Potassium 3.2 (*)    Chloride 87 (*)    Glucose, Bld 128 (*)    AST 62 (*)    ALT 81 (*)    All other components within normal limits  LIPASE, BLOOD - Abnormal; Notable for the following:    Lipase 57 (*)    All other components within normal limits  CBC WITH DIFFERENTIAL/PLATELET - Abnormal; Notable for the following:    Neutrophils Relative % 83 (*)    Lymphocytes Relative 9 (*)    Lymphs Abs 0.5 (*)    All other components within normal limits  BASIC METABOLIC PANEL - Abnormal; Notable for the following:    Sodium 127 (*)    Potassium 3.3  (*)    Chloride 93 (*)    Glucose, Bld 107 (*)    Calcium 8.6 (*)    All other components within normal limits  BASIC METABOLIC PANEL - Abnormal; Notable for the following:    Sodium 127 (*)    Potassium 3.2 (*)    Chloride 97 (*)    Calcium 8.2 (*)    All other components within normal limits  OSMOLALITY - Abnormal; Notable for the following:    Osmolality 263 (*)    All other components within normal limits  OSMOLALITY, URINE - Abnormal; Notable for the following:  Osmolality, Ur 245 (*)    All other components within normal limits  CBC - Abnormal; Notable for the following:    RBC 3.58 (*)    HCT 34.9 (*)    All other components within normal limits  BASIC METABOLIC PANEL - Abnormal; Notable for the following:    Sodium 127 (*)    Potassium 3.3 (*)    Chloride 97 (*)    Calcium 8.0 (*)    All other components within normal limits  HEPATIC FUNCTION PANEL - Abnormal; Notable for the following:    Total Protein 6.0 (*)    Albumin 3.3 (*)    AST 58 (*)    ALT 76 (*)    All other components within normal limits  BASIC METABOLIC PANEL - Abnormal; Notable for the following:    Sodium 127 (*)    Potassium 3.3 (*)    Chloride 97 (*)    Glucose, Bld 125 (*)    Calcium 8.5 (*)    All other components within normal limits  URINALYSIS, ROUTINE W REFLEX MICROSCOPIC (NOT AT [facility])  TSH  CORTISOL-AM, BLOOD  SODIUM, URINE, RANDOM    Imaging Review Dg Chest 2 View  10/20/2014   CLINICAL DATA:  Increasing weakness and shortness of breath for 1 month.  EXAM: CHEST  2 VIEW  COMPARISON:  09/19/2014  FINDINGS: The cardiac silhouette, mediastinal and hilar contours are within normal limits and stable. There is mild tortuosity, ectasia and calcification of the thoracic aorta. Stable surgical changes from bypass surgery. All The lungs demonstrate chronic emphysematous changes and pulmonary scarring but no acute overlying pulmonary process. No pleural effusion. The bony thorax is intact.   IMPRESSION: Chronic emphysematous changes and pulmonary scarring but no acute pulmonary findings.   Electronically Signed   By: Rudie Meyer M.D.   On: 10/20/2014 16:06     EKG Interpretation None       CRITICAL CARE Performed by: Mirian Mo   Total critical care time: 35 min  Critical care time was exclusive of separately billable procedures and treating other patients.  Critical care was necessary to treat or prevent imminent or life-threatening deterioration.  Critical care was time spent personally by me on the following activities: development of treatment plan with patient and/or surrogate as well as nursing, discussions with consultants, evaluation of patient's response to treatment, examination of patient, obtaining history from patient or surrogate, ordering and performing treatments and interventions, ordering and review of laboratory studies, ordering and review of radiographic studies, pulse oximetry and re-evaluation of patient's condition.   MDM   Final diagnoses:  Dyspnea    81 y.o. female with pertinent PMH of anxiety, depression, recent diverticulitis presents with chronic symptoms of fatigue, malaise, and intermittent nausea and diarrhea.  ? Worsening over last week.  Seen by PCP for same, had unremarkable wu with exception of mild hyponatremia.  On arrival vitals and physical exam as above.  Dorna Bloom revealed significant hyponatremia, likely 2/2 FTT.  Admitted in stable condition after NS bolus  I have reviewed all laboratory and imaging studies if ordered as above  1. Dyspnea   2. Hyponatremia      Mirian Mo, MD 10/21/14 1536

## 2014-10-20 NOTE — ED Notes (Signed)
Per son. Pt not feeling well x 1 week. Seen by PCP on Tuesday. Results given Low Na. Stopped Hydrocholorothiazide for two days. Pt continues to feel weak, unable to eat, anxiety, N/V. Here for evaluation.

## 2014-10-21 DIAGNOSIS — E46 Unspecified protein-calorie malnutrition: Secondary | ICD-10-CM

## 2014-10-21 DIAGNOSIS — F329 Major depressive disorder, single episode, unspecified: Secondary | ICD-10-CM

## 2014-10-21 DIAGNOSIS — L8990 Pressure ulcer of unspecified site, unspecified stage: Secondary | ICD-10-CM

## 2014-10-21 DIAGNOSIS — I482 Chronic atrial fibrillation: Secondary | ICD-10-CM

## 2014-10-21 DIAGNOSIS — E871 Hypo-osmolality and hyponatremia: Principal | ICD-10-CM

## 2014-10-21 LAB — BASIC METABOLIC PANEL
ANION GAP: 6 (ref 5–15)
Anion gap: 6 (ref 5–15)
BUN: 11 mg/dL (ref 6–20)
BUN: 9 mg/dL (ref 6–20)
CALCIUM: 8.5 mg/dL — AB (ref 8.9–10.3)
CO2: 24 mmol/L (ref 22–32)
CO2: 24 mmol/L (ref 22–32)
CREATININE: 0.75 mg/dL (ref 0.44–1.00)
Calcium: 8 mg/dL — ABNORMAL LOW (ref 8.9–10.3)
Chloride: 97 mmol/L — ABNORMAL LOW (ref 101–111)
Chloride: 97 mmol/L — ABNORMAL LOW (ref 101–111)
Creatinine, Ser: 0.5 mg/dL (ref 0.44–1.00)
GFR calc Af Amer: >60 mL/min (ref >60)
GFR calc non Af Amer: >60 mL/min (ref >60)
GFR calc non Af Amer: >60 mL/min (ref >60)
Glucose, Bld: 94 mg/dL (ref 65–99)
Glucose, Bld: 125 mg/dL — ABNORMAL HIGH (ref 65–99)
Potassium: 3.3 mmol/L — ABNORMAL LOW (ref 3.5–5.1)
Potassium: 3.3 mmol/L — ABNORMAL LOW (ref 3.5–5.1)
SODIUM: 127 mmol/L — AB (ref 135–145)
Sodium: 127 mmol/L — ABNORMAL LOW (ref 135–145)

## 2014-10-21 LAB — CBC
HCT: 34.9 % — ABNORMAL LOW (ref 36.0–46.0)
Hemoglobin: 12 g/dL (ref 12.0–15.0)
MCH: 33.5 pg (ref 26.0–34.0)
MCHC: 34.4 g/dL (ref 30.0–36.0)
MCV: 97.5 fL (ref 78.0–100.0)
Platelets: 242 10*3/uL (ref 150–400)
RBC: 3.58 MIL/uL — ABNORMAL LOW (ref 3.87–5.11)
RDW: 14.5 % (ref 11.5–15.5)
WBC: 6.1 10*3/uL (ref 4.0–10.5)

## 2014-10-21 LAB — OSMOLALITY: OSMOLALITY: 263 mosm/kg — AB (ref 275–300)

## 2014-10-21 LAB — HEPATIC FUNCTION PANEL
ALT: 76 U/L — ABNORMAL HIGH (ref 14–54)
AST: 58 U/L — AB (ref 15–41)
Albumin: 3.3 g/dL — ABNORMAL LOW (ref 3.5–5.0)
Alkaline Phosphatase: 85 U/L (ref 38–126)
BILIRUBIN DIRECT: 0.2 mg/dL (ref 0.1–0.5)
Indirect Bilirubin: 0.6 mg/dL (ref 0.3–0.9)
TOTAL PROTEIN: 6 g/dL — AB (ref 6.5–8.1)
Total Bilirubin: 0.8 mg/dL (ref 0.3–1.2)

## 2014-10-21 LAB — OSMOLALITY, URINE: OSMOLALITY UR: 245 mosm/kg — AB (ref 390–1090)

## 2014-10-21 MED ORDER — COSYNTROPIN 0.25 MG IJ SOLR: 0.2500 mg | Freq: Once | INTRAMUSCULAR | Status: DC

## 2014-10-21 NOTE — Progress Notes (Signed)
Physical Therapy Evaluation Patient Details Name: Jacqueline [name] MRN: [id number] DOB: August 01, 1928 Today's Date: 10/21/2014   History of Present Illness  81 y.o. female with h/o mild dementia, CAD, CABGx5 admitted with anxiety, confusion, decreased oral intake. Dx of hyponatremia, hypokalemia.  Clinical Impression  Pt admitted with above diagnosis. Pt currently with functional limitations due to the deficits listed below (see PT Problem List).  Mod assist for pivoting to BSC x 2. Pt walked 2' x 2 with shuffling gait and assist for balance. ST-SNF recommended.  Pt will benefit from skilled PT to increase their independence and safety with mobility to allow discharge to the venue listed below.       Follow Up Recommendations SNF    Equipment Recommendations  None recommended by PT    Recommendations for Other Services       Precautions / Restrictions Precautions Precautions: Fall Restrictions Weight Bearing Restrictions: No      Mobility  Bed Mobility Overal bed mobility: Needs Assistance Bed Mobility: Supine to Sit     Supine to sit: Mod assist        Transfers Overall transfer level: Needs assistance Equipment used: Rolling walker (2 wheeled) Transfers: Sit to/from Stand;Stand Pivot Transfers Sit to Stand: +2 physical assistance;+2 safety/equipment;Mod assist Stand pivot transfers: Mod assist       General transfer comment: Mod A to rise, pt 70%, assist to steady, cues for hand placement  Ambulation/Gait Ambulation/Gait assistance: Min assist Ambulation Distance (Feet): 4 Feet (2' x 2) Assistive device: Rolling walker (2 wheeled) Gait Pattern/deviations: Shuffle;Decreased stride length;Step-to pattern   Gait velocity interpretation: Below normal speed for age/gender General Gait Details: assist to steady, cues for hand placment  Stairs            Wheelchair Mobility    Modified Rankin (Stroke Patients Only)       Balance Overall balance  assessment: Needs assistance   Sitting balance-Leahy Scale: Fair       Standing balance-Leahy Scale: Poor                               Pertinent Vitals/Pain Pain Assessment: No/denies pain    Home Living Family/patient expects to be discharged to:: Private residence Living Arrangements: Children Available Help at Discharge: Family;Available PRN/intermittently (son and DIL work) Type of Home: House Home Access: Stairs to enter Entrance Stairs-Rails: Doctor, general practice of Steps: 5 Home Layout: One level Home Equipment: Environmental consultant - 4 wheels;Bedside commode;Shower seat      Prior Function Level of Independence: Needs assistance   Gait / Transfers Assistance Needed: uses rollator  ADL's / Homemaking Assistance Needed: assist for bathing and dressing        Hand Dominance        Extremity/Trunk Assessment   Upper Extremity Assessment: Overall WFL for tasks assessed           Lower Extremity Assessment: Generalized weakness (knee ext -4/5)      Cervical / Trunk Assessment: Kyphotic  Communication   Communication: No difficulties  Cognition Arousal/Alertness: Awake/alert Behavior During Therapy: WFL for tasks assessed/performed Overall Cognitive Status: Within Functional Limits for tasks assessed                      General Comments      Exercises        Assessment/Plan    PT Assessment Patient needs continued PT services  PT Diagnosis Difficulty  walking;Generalized weakness   PT Problem List Decreased strength;Decreased activity tolerance;Decreased mobility  PT Treatment Interventions Gait training;Functional mobility training;Therapeutic activities;Patient/family education;Therapeutic exercise   PT Goals (Current goals can be found in the Care Plan section) Acute Rehab PT Goals Patient Stated Goal: to get stronger PT Goal Formulation: With patient Time For Goal Achievement: 11/04/14 Potential to Achieve Goals:  Fair    Frequency Min 3X/week   Barriers to discharge Decreased caregiver support family works     Co-evaluation               End of Session Equipment Utilized During Treatment: Gait belt Activity Tolerance: Patient tolerated treatment well Patient left: in chair;with call bell/phone within reach;with chair alarm set Nurse Communication: Mobility status         Time: [phone] PT Time Calculation (min) (ACUTE ONLY): 30 min   Charges:   PT Evaluation $Initial PT Evaluation Tier I: 1 Procedure PT Treatments $Therapeutic Activity: 8-22 mins   PT G Codes:        Tamala SerUhlenberg, Millicent Blazejewski Kistler 10/21/2014, 9:34 AM [phone]

## 2014-10-21 NOTE — Discharge Instructions (Signed)

## 2014-10-21 NOTE — Progress Notes (Signed)
Initial Nutrition Assessment  DOCUMENTATION CODES:  Severe malnutrition in context of chronic illness, Underweight  INTERVENTION:  Ensure Enlive (each supplement provides 350kcal and 20 grams of protein) BID Magic cup BID with meals, each supplement provides 290 kcal and 9 grams of protein Encourage PO intake RD to continue to monitor  NUTRITION DIAGNOSIS:  Malnutrition related to chronic illness as evidenced by severe depletion of body fat, severe depletion of muscle mass.  GOAL:  Patient will meet greater than or equal to 90% of their needs  MONITOR:  PO intake, Supplement acceptance, Labs, Weight trends, Skin, I & O's  REASON FOR ASSESSMENT:  Consult Assessment of nutrition requirement/status  ASSESSMENT: 81 y.o. female has a past medical history significant for mild dementia, coronary artery disease status post CABG 5, remote paroxysmal A. Fib... and history of dementia, presents to the emergency room with a chief complaint of anxiety as well as weakness.   Pt reports poor appetite this past week d/t poor appetite and constipation. Pt was drinking prune juice during visit. Pt's son reports UBW of 110 lb, so weight is stable.  PO intake: 15% of eggs and biscuit this AM.  Pt reports drinking Ensure supplements provided. Pt would like to try Magic cups with her lunch/dinner meals. RD to order.  Nutrition-Focused physical exam completed. Findings are severe fat depletion, severe muscle depletion, and no edema.   Labs reviewed: Low Na, K  Height:  Ht Readings from Last 1 Encounters:  10/20/14 5\' 6"  (1.676 m)    Weight:  Wt Readings from Last 1 Encounters:  10/21/14 109 lb 2 oz (49.5 kg)    Ideal Body Weight:  59.1 kg  Wt Readings from Last 10 Encounters:  10/21/14 109 lb 2 oz (49.5 kg)  10/16/14 106 lb 6.4 oz (48.263 kg)  09/20/14 108 lb (48.988 kg)  09/11/14 110 lb (49.896 kg)  07/19/14 107 lb (48.535 kg)  07/03/14 105 lb (47.628 kg)  02/05/14 108 lb 4  oz (49.102 kg)  01/02/14 109 lb (49.442 kg)  10/05/13 107 lb 9.6 oz (48.807 kg)  09/11/13 109 lb (49.442 kg)    BMI:  Body mass index is 17.62 kg/(m^2).  Estimated Nutritional Needs:  Kcal:  1300-1500  Protein:  70-80g  Fluid:  1.5L/day    Skin:  Wound (see comment) (Stage 1 sacral ulcer)  Diet Order:  Diet regular Room service appropriate?: Yes; Fluid consistency:: Thin  EDUCATION NEEDS:  No education needs identified at this time   Intake/Output Summary (Last 24 hours) at 10/21/14 1203 Last data filed at 10/21/14 0700  Gross per 24 hour  Intake 1799.58 ml  Output      0 ml  Net 1799.58 ml    Last BM:  PTA -constipated  Tilda FrancoLindsey Lakshya Mcgillicuddy, MS, RD, LDN Pager: [phone] After Hours Pager: [phone]

## 2014-10-21 NOTE — Progress Notes (Signed)
[facility] PROGRESS NOTE  Jacqueline [name] WUJ:[id number] DOB: Jun 15, 1928 DOA: 10/20/2014 PCP: [name], MD  Assessment/Plan: Hyponatremia - This is likely in the setting of dehydration. Patient is on Lexapro, which can cause hyponatremia, however she was beginning to be hyponatremic before she started the Lexapro, and has only been taking this for 2 days, which is unlikely to contribute.  - Will continue Lexapro for now - Serum osmolality 263, urine osmolality 245, urine sodium 65 - Gentle hydration with normal saline with improvement in sodium  - TSH normal and a.m. cortisol unremarkable at 16.4  Generalized anxiety - Patient visibly anxious in the ED - Continued Lexapro - Pt on Xanax as needed.  Hypertension - To resume her home medications - IV hydralazine as needed for systolic blood pressure greater than 180 - She tends to be hypertensive as an outpatient, when she saw cardiology in May she was 218/108 in office  Mild dementia - Resume her home medication  Coronary artery disease - Patient without chest pain, palpitations - Monitor on telemetry  Protein calorie malnutrition - nutrition consult  - Nutritional supplements   Hypokalemia - Likely in the setting of poor by mouth intake  - Replete and repeat BMP in the morning   Mild LFT elevation - Her AST and ALT are borderline elevated, as well as her lipase. She remains without GI symptoms and abdominal exam is fairly benign. - Cont to monitor  PAF - followed by cardiology as an outpatient, felt not to be an anticoagulation candidate, continue her home medications - CHA2DS2-VASc Score 4 - currently in sinus rhythm, monitor on telemetry   Code Status: DNR Family Communication: Pt in room  Disposition Plan: Pending   Consultants:    Procedures:    Antibiotics:  none  HPI/Subjective: Feels better.   Objective: Filed Vitals:   10/20/14 1435 10/20/14 2125 10/21/14 0522 10/21/14  1326  BP: 159/89 154/80 158/74 103/51  Pulse: 56 56 58 63  Temp: 98.5 F (36.9 C) 98.4 F (36.9 C) 98.5 F (36.9 C) 98.6 F (37 C)  TempSrc: Oral Oral Oral Oral  Resp: Height:      Weight:   49.5 kg (109 lb 2 oz)   SpO2: 100% 100% 100% 100%    Intake/Output Summary (Last 24 hours) at 10/21/14 1440 Last data filed at 10/21/14 0815  Gross per 24 hour  Intake 1159.58 ml  Output      0 ml  Net 1159.58 ml   Filed Weights   10/20/14 1119 10/21/14 0522  Weight: 48.081 kg (106 lb) 49.5 kg (109 lb 2 oz)    Exam:   General:  Awake, in nad  Cardiovascular: regular, s1, s2  Respiratory: normal resp effort, no wheezing  Abdomen: soft,nondistended  Musculoskeletal: perfused, no clubbing   Data Reviewed: Basic Metabolic Panel:  Recent Labs Lab 10/20/14 1145 10/20/14 1704 10/20/14 2300 10/21/14 0510 10/21/14 1118  NA 121* 127* 127* 127* 127*  K 3.2* 3.3* 3.2* 3.3* 3.3*  CL 87* 93* 97* 97* 97*  CO2 GLUCOSE 128* 107* 96 94 125*  BUN CREATININE 0.59 0.47 0.49 0.50 0.75  CALCIUM 9.1 8.6* 8.2* 8.0* 8.5*   Liver Function Tests:  Recent Labs Lab 10/20/14 1145 10/21/14 0510  AST 62* 58*  ALT 81* 76*  ALKPHOS 93 85  BILITOT 0.9 0.8  PROT 6.9 6.0*  ALBUMIN 3.8 3.3*  Recent Labs Lab 10/20/14 1145  LIPASE 57*   No results for input(s): AMMONIA in the last 168 hours. CBC:  Recent Labs Lab 10/16/14 1105 10/20/14 1145 10/21/14 0510  WBC 7.4 6.0 6.1  NEUTROABS  --  4.9  --   HGB 12.6 13.0 12.0  HCT 37.3 37.7 34.9*  MCV 100.9* 96.4 97.5  PLT 248.0 265 242   Cardiac Enzymes: No results for input(s): CKTOTAL, CKMB, CKMBINDEX, TROPONINI in the last 168 hours. BNP (last 3 results)  Recent Labs  07/19/14 1818  BNP 191.0*    ProBNP (last 3 results) No results for input(s): PROBNP in the last 8760 hours.  CBG: No results for input(s): GLUCAP in the last 168 hours.  No results found for this or any  previous visit (from the past 240 hour(s)).   Studies: Dg Chest 2 View  10/20/2014   CLINICAL DATA:  Increasing weakness and shortness of breath for 1 month.  EXAM: CHEST  2 VIEW  COMPARISON:  09/19/2014  FINDINGS: The cardiac silhouette, mediastinal and hilar contours are within normal limits and stable. There is mild tortuosity, ectasia and calcification of the thoracic aorta. Stable surgical changes from bypass surgery. All The lungs demonstrate chronic emphysematous changes and pulmonary scarring but no acute overlying pulmonary process. No pleural effusion. The bony thorax is intact.  IMPRESSION: Chronic emphysematous changes and pulmonary scarring but no acute pulmonary findings.   Electronically Signed   By: Rudie MeyerP.  Gallerani M.D.   On: 10/20/2014 16:06    Scheduled Meds: . amiodarone  200 mg Oral Daily  . aspirin EC  325 mg Oral QPM  . atorvastatin  40 mg Oral q1800  . diclofenac sodium  4 g Topical QID  . enoxaparin (LOVENOX) injection  40 mg Subcutaneous Q24H  . escitalopram  10 mg Oral QHS  . feeding supplement (ENSURE ENLIVE)  237 mL Oral BID BM  . fluticasone  2 spray Each Nare Daily  . folic acid  1 mg Oral Daily  . latanoprost  1 drop Both Eyes QHS  . lisinopril  40 mg Oral Daily  . loratadine  10 mg Oral Daily  . memantine  10 mg Oral BID  . metoprolol tartrate  12.5 mg Oral BID  . multivitamin  0.5 tablet Oral BID  . sodium chloride  3 mL Intravenous Q12H   Continuous Infusions: . sodium chloride 50 mL/hr at 10/21/14 0440    Principal Problem:   Hyponatremia Active Problems:   HTN (hypertension)   CAD (coronary artery disease)   Atrial fibrillation   SOB (shortness of breath)   Protein calorie malnutrition   Mixed vascular and neurodegenerative dementia   Depression   Pressure ulcer   [name], [name]  [facility] Pager [phone]. If 7PM-7AM, please contact night-coverage at [email or web address], password [email or web address] 10/21/2014, 2:40 PM  LOS: 1 day

## 2014-10-22 ENCOUNTER — Inpatient Hospital Stay (HOSPITAL_COMMUNITY): Payer: Commercial Managed Care - HMO

## 2014-10-22 ENCOUNTER — Telehealth: Payer: Self-pay | Admitting: Physician Assistant

## 2014-10-22 DIAGNOSIS — R0602 Shortness of breath: Secondary | ICD-10-CM

## 2014-10-22 DIAGNOSIS — I10 Essential (primary) hypertension: Secondary | ICD-10-CM

## 2014-10-22 LAB — BASIC METABOLIC PANEL
Anion gap: 5 (ref 5–15)
BUN: 16 mg/dL (ref 6–20)
CHLORIDE: 98 mmol/L — AB (ref 101–111)
CO2: 24 mmol/L (ref 22–32)
Calcium: 8.3 mg/dL — ABNORMAL LOW (ref 8.9–10.3)
Creatinine, Ser: 0.57 mg/dL (ref 0.44–1.00)
GFR calc non Af Amer: >60 mL/min (ref >60)
Glucose, Bld: 100 mg/dL — ABNORMAL HIGH (ref 65–99)
POTASSIUM: 3.5 mmol/L (ref 3.5–5.1)
Sodium: 127 mmol/L — ABNORMAL LOW (ref 135–145)

## 2014-10-22 LAB — CREATININE, URINE, RANDOM: CREATININE, URINE: 25.69 mg/dL

## 2014-10-22 MED ORDER — ONDANSETRON HCL 4 MG/2ML IJ SOLN
4.0000 mg | Freq: Four times a day (QID) | INTRAMUSCULAR | Status: DC | PRN
  Administered 2014-10-22: 4 mg via INTRAVENOUS
  Filled 2014-10-22: qty 2

## 2014-10-22 MED ORDER — MAGNESIUM CITRATE PO SOLN
1.0000 | Freq: Once | ORAL | Status: AC
  Administered 2014-10-22: 1 via ORAL

## 2014-10-22 NOTE — Care Management (Signed)
Important Message  Patient Details  Name: Jacqueline [name] MRN: [id number] Date of Birth: Jun 10, 1928   Medicare Important Message Given:  Yes-second notification given    Haskell FlirtJamison, Cleon Thoma 10/22/2014, 12:42 PM

## 2014-10-22 NOTE — Telephone Encounter (Signed)
No charge. 

## 2014-10-22 NOTE — Telephone Encounter (Signed)
Pt was no show 10/19/14 10:30am, acute appt, son called in today to cancel 10/24/14 appt, he stated that pt was admitted to the hospital over the weekend. Charge for no show?

## 2014-10-22 NOTE — Clinical Social Work Placement (Signed)
   CLINICAL SOCIAL WORK PLACEMENT  NOTE  Date:  10/22/2014  Patient Details  Name: Jacqueline [name] MRN: [id number] Date of Birth: [birth year]  Clinical Social Work is seeking post-discharge placement for this patient at the Skilled  Nursing Facility level of care (*CSW will initial, date and re-position this form in  chart as items are completed):  Yes   Patient/family provided with [facility] Clinical Social Work Department's list of facilities offering this level of care within the geographic area requested by the patient (or if unable, by the patient's family).  Yes   Patient/family informed of their freedom to choose among providers that offer the needed level of care, that participate in Medicare, Medicaid or managed care program needed by the patient, have an available bed and are willing to accept the patient.  Yes   Patient/family informed of [facility] ownership interest in [facility] and [facility], as well as of the fact that they are under no obligation to receive care at these facilities.  PASRR submitted to EDS on 10/22/14     PASRR number received on 10/22/14     Existing PASRR number confirmed on       FL2 transmitted to all facilities in geographic area requested by pt/family on 10/22/14     FL2 transmitted to all facilities within larger geographic area on       Patient informed that his/her managed care company has contracts with or will negotiate with certain facilities, including the following:        Yes   Patient/family informed of bed offers received.  [facility]     Physician recommends and patient chooses bed at      Patient to be transferred to   on  .  Patient to be transferred to facility by       Patient family notified on   of transfer.  Name of family member notified:        PHYSICIAN       Additional Comment:    _______________________________________________ Arlyss RepressHarrison, Jenna Routzahn F,  LCSW 10/22/2014, 4:28 PM

## 2014-10-22 NOTE — Clinical Social Work Note (Signed)
Clinical Social Work Assessment  Patient Details  Name: Jacqueline [name] MRN: [id number] Date of Birth: [birth year]  Date of referral:  10/22/14               Reason for consult:  Facility Placement                Permission sought to share information with:  Ocean[facility]ility Contact Representative Permission granted to share information::  Yes, Verbal Permission Granted  Name::        Agency::     Relationship::     Contact Information:     Housing/Transportation Living arrangements for the past 2 months:  Single Family Home Source of Information:  Patient, Adult Children Patient Interpreter Needed:  None Criminal Activity/Legal Involvement Pertinent to Current Situation/Hospitalization:  No - Comment as needed Significant Relationships:  Adult Children Lives with:  Adult Children Do you feel safe going back to the place where you live?  No Need for family participation in patient care:  Yes (Comment)  Care giving concerns:  CSW reviewed PT evaluation recommending SNF at discharge.    Social Worker assessment / plan:  CSW spoke with patient & daughter-in-law, Jacqueline [name] to confirm that they are agreeable with plan for SNF at discharge.   Employment status:  Retired Database administratornsurance information:  Managed Medicare PT Recommendations:  Skilled Nursing Facility Information / Referral to community resources:  Skilled Nursing Facility  Patient/Family's Response to care:  Patient informed CSW that she had been to a rehab facility in the past, [place]). Patient's daughter informed CSW that [place] [facility], CSW spoke with Jacqueline [name] at [facility] but they are not in-network with her insurance ([employer] Medicare), CSW confirmed with [name] at [facility] that they can offer a bed. Patient's daughter-in-law accepted that bed offer stating that they've driven by that facility and were impressed.   Patient/Family's Understanding of and Emotional  Response to Diagnosis, Current Treatment, and Prognosis:  Patient's daughter-in-law seemed ok with her going to rehab, CSW relayed from Dr. Rhona Leavenshiu that if her sodium is above 130 tomorrow (127 today) that she would be ok to go. Patient & family agreeable.   Emotional Assessment Appearance:  Appears stated age Attitude/Demeanor/Rapport:    Affect (typically observed):  Pleasant, Quiet Orientation:  Oriented to Self, Oriented to Place, Oriented to  Time, Oriented to Situation Alcohol / Substance use:    Psych involvement (Current and /or in the community):     Discharge Needs  Concerns to be addressed:    Readmission within the last 30 days:    Current discharge risk:    Barriers to Discharge:      Arlyss RepressHarrison, Yeriel Mineo F, LCSW 10/22/2014, 4:16 PM

## 2014-10-22 NOTE — Progress Notes (Signed)
[facility] PROGRESS NOTE  Otelia LimesJacqueline A Advincula ZOX:[id number] DOB: [birth year] DOA: 10/20/2014 PCP: Neena RhymesKatherine Tabori, MD  Assessment/Plan: Hyponatremia - This is likely in the setting of dehydration. Patient is on Lexapro, which can cause hyponatremia, however she was beginning to be hyponatremic before she started the Lexapro, and has only been taking this for 2 days, which is unlikely to contribute. However, given continued hyponatremia, will hold Lexapro - Will continue Lexapro for now - Serum osmolality 263, urine osmolality 245, urine sodium 65 - Check urine Cr in order to calculate FENa - Gentle hydration with normal saline with initial improvement in sodium  - TSH normal and a.m. cortisol unremarkable at 16.4  Generalized anxiety - Patient visibly anxious in the ED - Initially continued Lexapro - Jacqueline [name] on Xanax as needed. - Given persistent hyponatremia, will hold Lexapro for now  Hypertension - To resume her home medications - IV hydralazine as needed for systolic blood pressure greater than 180 - She tends to be hypertensive as an outpatient, when she saw cardiology in May she was 218/108 in office  Mild dementia - Resume her home medication  Coronary artery disease - Patient without chest pain, palpitations - Monitor on telemetry  Protein calorie malnutrition - nutrition consult  - Nutritional supplements   Hypokalemia - Likely in the setting of poor by mouth intake  - Replete and repeat BMP in the morning   Mild LFT elevation - Her AST and ALT are borderline elevated, as well as her lipase. She remains without GI symptoms and abdominal exam is fairly benign. - Cont to monitor  PAF - followed by cardiology as an outpatient, felt not to be an anticoagulation candidate, continue her home medications - CHA2DS2-VASc Score 4 - currently in sinus rhythm, monitor on telemetry   Code Status: DNR Family Communication: Jacqueline [name] in room  Disposition Plan:  Pending   Consultants:    Procedures:    Antibiotics:  none  HPI/Subjective: Feels cold. Denies abd pain  Objective: Filed Vitals:   10/21/14 2120 10/22/14 0420 10/22/14 1040 10/22/14 1325  BP: 104/58 185/72 130/62 146/66  Pulse: 55 44 50 55  Temp: 98.4 F (36.9 C) 97.5 F (36.4 C)  98.5 F (36.9 C)  TempSrc: Oral Oral  Oral  Resp: 20 20  20   Height:      Weight:      SpO2: 99% 100%  100%    Intake/Output Summary (Last 24 hours) at 10/22/14 1524 Last data filed at 10/22/14 1500  Gross per 24 hour  Intake   2080 ml  Output      0 ml  Net   2080 ml   Filed Weights   10/20/14 1119 10/21/14 0522  Weight: 48.081 kg (106 lb) 49.5 kg (109 lb 2 oz)    Exam:   General:  Awake, in nad  Cardiovascular: regular, s1, s2  Respiratory: normal resp effort, no wheezing  Abdomen: soft,nondistended  Musculoskeletal: perfused, no clubbing   Data Reviewed: Basic Metabolic Panel:  Recent Labs Lab 10/20/14 1704 10/20/14 2300 10/21/14 0510 10/21/14 1118 10/22/14 0415  NA 127* 127* 127* 127* 127*  [name] 3.3* 3.2* 3.3* 3.3* 3.5  CL 93* 97* 97* 97* 98*  CO2 25 24 24 24 24   GLUCOSE 107* 96 94 125* 100*  BUN 10 10 11 9 16   CREATININE 0.47 0.49 0.50 0.75 0.57  CALCIUM 8.6* 8.2* 8.0* 8.5* 8.3*   Liver Function Tests:  Recent Labs Lab 10/20/14 1145 10/21/14 0510  AST 62*  58*  ALT 81* 76*  ALKPHOS 93 85  BILITOT 0.9 0.8  PROT 6.9 6.0*  ALBUMIN 3.8 3.3*    Recent Labs Lab 10/20/14 1145  LIPASE 57*   No results for input(s): AMMONIA in the last 168 hours. CBC:  Recent Labs Lab 10/16/14 1105 10/20/14 1145 10/21/14 0510  WBC 7.4 6.0 6.1  NEUTROABS  --  4.9  --   HGB 12.6 13.0 12.0  HCT 37.3 37.7 34.9*  MCV 100.9* 96.4 97.5  PLT 248.0 265 242   Cardiac Enzymes: No results for input(s): CKTOTAL, CKMB, CKMBINDEX, TROPONINI in the last 168 hours. BNP (last 3 results)  Recent Labs  07/19/14 1818  BNP 191.0*    ProBNP (last 3 results) No  results for input(s): PROBNP in the last 8760 hours.  CBG: No results for input(s): GLUCAP in the last 168 hours.  No results found for this or any previous visit (from the past 240 hour(s)).   Studies: Dg Chest 2 View  10/20/2014   CLINICAL DATA:  Increasing weakness and shortness of breath for 1 month.  EXAM: CHEST  2 VIEW  COMPARISON:  09/19/2014  FINDINGS: The cardiac silhouette, mediastinal and hilar contours are within normal limits and stable. There is mild tortuosity, ectasia and calcification of the thoracic aorta. Stable surgical changes from bypass surgery. All The lungs demonstrate chronic emphysematous changes and pulmonary scarring but no acute overlying pulmonary process. No pleural effusion. The bony thorax is intact.  IMPRESSION: Chronic emphysematous changes and pulmonary scarring but no acute pulmonary findings.   Electronically Signed   By: Rudie Meyer M.D.   On: 10/20/2014 16:06    Scheduled Meds: . amiodarone  200 mg Oral Daily  . aspirin EC  325 mg Oral QPM  . atorvastatin  40 mg Oral q1800  . diclofenac sodium  4 g Topical QID  . enoxaparin (LOVENOX) injection  40 mg Subcutaneous Q24H  . escitalopram  10 mg Oral QHS  . feeding supplement (ENSURE ENLIVE)  237 mL Oral BID BM  . fluticasone  2 spray Each Nare Daily  . folic acid  1 mg Oral Daily  . latanoprost  1 drop Both Eyes QHS  . lisinopril  40 mg Oral Daily  . loratadine  10 mg Oral Daily  . memantine  10 mg Oral BID  . metoprolol tartrate  12.5 mg Oral BID  . multivitamin  0.5 tablet Oral BID  . sodium chloride  3 mL Intravenous Q12H   Continuous Infusions: . sodium chloride 50 mL/hr at 10/22/14 0938    Principal Problem:   Hyponatremia Active Problems:   HTN (hypertension)   CAD (coronary artery disease)   Atrial fibrillation   SOB (shortness of breath)   Protein calorie malnutrition   Mixed vascular and neurodegenerative dementia   Depression   Pressure ulcer   Jacqueline [name]  [facility] Pager [phone]. If 7PM-7AM, please contact night-coverage at [email or web address], password [facility] 10/22/2014, 3:24 PM  LOS: 2 days

## 2014-10-23 ENCOUNTER — Ambulatory Visit: Payer: Commercial Managed Care - HMO | Admitting: Nurse Practitioner

## 2014-10-23 LAB — BASIC METABOLIC PANEL
Anion gap: 6 (ref 5–15)
BUN: 12 mg/dL (ref 6–20)
CALCIUM: 8.4 mg/dL — AB (ref 8.9–10.3)
CHLORIDE: 99 mmol/L — AB (ref 101–111)
CO2: 28 mmol/L (ref 22–32)
Creatinine, Ser: 0.55 mg/dL (ref 0.44–1.00)
Glucose, Bld: 98 mg/dL (ref 65–99)
Potassium: 3 mmol/L — ABNORMAL LOW (ref 3.5–5.1)
Sodium: 133 mmol/L — ABNORMAL LOW (ref 135–145)

## 2014-10-23 MED ORDER — TRAMADOL HCL 50 MG PO TABS: 50.0000 mg | ORAL_TABLET | Freq: Three times a day (TID) | ORAL | Status: DC | PRN

## 2014-10-23 MED ORDER — POTASSIUM CHLORIDE CRYS ER 20 MEQ PO TBCR
40.0000 meq | EXTENDED_RELEASE_TABLET | Freq: Two times a day (BID) | ORAL | Status: DC
  Administered 2014-10-23: 40 meq via ORAL
  Filled 2014-10-23: qty 2

## 2014-10-23 MED ORDER — POLYETHYLENE GLYCOL 3350 17 G PO PACK: 17.0000 g | PACK | Freq: Every day | ORAL | Status: DC

## 2014-10-23 NOTE — Progress Notes (Signed)
Physical Therapy Treatment Patient Details Name: Jacqueline [name] MRN: [id number] DOB: 08/09/1928 Today's Date: 10/23/2014    History of Present Illness 81 y.o. female with h/o mild dementia, CAD, CABGx5 admitted with anxiety, confusion, decreased oral intake. Dx of hyponatremia, hypokalemia.    PT Comments    [facility] then to recliner with RW. Pt fatigues quickly. ST-SNF recommended for rehab.    Follow Up Recommendations  SNF     Equipment Recommendations  None recommended by PT    Recommendations for Other Services       Precautions / Restrictions Precautions Precautions: Fall Restrictions Weight Bearing Restrictions: No    Mobility  Bed Mobility Overal bed mobility: Needs Assistance Bed Mobility: Supine to Sit     Supine to sit: Mod assist     General bed mobility comments: assist to raise trunk  Transfers Overall transfer level: Needs assistance Equipment used: Rolling walker (2 wheeled) Transfers: Sit to/from [employer];Stand Pivot Transfers Sit to Stand: Mod assist Stand pivot transfers: Mod assist       General transfer comment: Mod A to rise, pt 70%, assist to steady, cues for hand placement; incontinent of urine in standing; [facility], then took a few pivotal steps to recliner  Ambulation/Gait Ambulation/Gait assistance: Min assist Ambulation Distance (Feet): 4 Feet Assistive device: Rolling walker (2 wheeled) Gait Pattern/deviations: Step-to pattern;Shuffle     General Gait Details: assist to steady, cues for hand placment   Stairs            Wheelchair Mobility    Modified Rankin (Stroke Patients Only)       Balance     Sitting balance-Leahy Scale: Fair       Standing balance-Leahy Scale: Poor                      Cognition Arousal/Alertness: Awake/alert Behavior During Therapy: WFL for tasks assessed/performed Overall Cognitive Status: Within Functional Limits for tasks assessed                       Exercises      General Comments        Pertinent Vitals/Pain Pain Assessment: No/denies pain    Home Living                      Prior Function            PT Goals (current goals can now be found in the care plan section) Acute Rehab PT Goals Patient Stated Goal: to get stronger PT Goal Formulation: With patient Time For Goal Achievement: 11/04/14 Potential to Achieve Goals: Fair Progress towards PT goals: Progressing toward goals    Frequency  Min 3X/week    PT Plan Current plan remains appropriate    Co-evaluation             End of Session Equipment Utilized During Treatment: Gait belt Activity Tolerance: Patient tolerated treatment well Patient left: in chair;with call bell/phone within reach;with chair alarm set     Time: 1210-1223 PT Time Calculation (min) (ACUTE ONLY): 13 min  Charges:  $Therapeutic Activity: 8-22 mins                    G Codes:      Tamala SerUhlenberg, Crayton Savarese Kistler 10/23/2014, 12:30 PM  [phone]

## 2014-10-23 NOTE — Clinical Social Work Placement (Signed)
Patient is set to discharge to [facility] today. Patient & son, Jacqueline [name] aware. Discharge packet given to RN, Amil AmenJulia. Patient's son to transport to SNF.     Lincoln MaxinKelly Karys Meckley, LCSW [facility] Clinical Social Worker cell #: [phone]    CLINICAL SOCIAL WORK PLACEMENT  NOTE  Date:  10/23/2014  Patient Details  Name: Otelia LimesJacqueline A Alsteen MRN: [id number] Date of Birth: [birth year]  Clinical Social Work is seeking post-discharge placement for this patient at the Skilled  Nursing Facility level of care (*CSW will initial, date and re-position this form in  chart as items are completed):  Yes   Patient/family provided with [facility] Clinical Social Work Department's list of facilities offering this level of care within the geographic area requested by the patient (or if unable, by the patient's family).  Yes   Patient/family informed of their freedom to choose among providers that offer the needed level of care, that participate in Medicare, Medicaid or managed care program needed by the patient, have an available bed and are willing to accept the patient.  Yes   Patient/family informed of [place]'s ownership interest in [facility] and [facility], as well as of the fact that they are under no obligation to receive care at these facilities.  PASRR submitted to EDS on 10/22/14     PASRR number received on 10/22/14     Existing PASRR number confirmed on       FL2 transmitted to all facilities in geographic area requested by pt/family on 10/22/14     FL2 transmitted to all facilities within larger geographic area on       Patient informed that his/her managed care company has contracts with or will negotiate with certain facilities, including the following:        Yes   Patient/family informed of bed offers received.  [facility]     Physician recommends and patient chooses bed at      Patient to be [facility] on 10/23/14.  Patient to be transferred to facility by patient's son, Jacqueline [name]     Patient family notified on 10/23/14 of transfer.  Name of family member notified:  patient's son, Jacqueline [name] & daughter-in-law at bedside     PHYSICIAN       Additional Comment:    _______________________________________________ Arlyss RepressHarrison, Jaidah Lomax F, LCSW 10/23/2014, 1:05 PM

## 2014-10-23 NOTE — Care Management Note (Signed)
Case Management Note  Patient Details  Name: Jacqueline [name] MRN: [id number] Date of Birth: 1929-01-25  Subjective/Objective:                    Action/Plan:d/c snf.   Expected Discharge Date:                  Expected Discharge Plan:  Skilled Nursing Facility  In-House Referral:     Discharge planning Services     Post Acute Care Choice:    Choice offered to:     DME Arranged:    DME Agency:     HH Arranged:    HH Agency:     Status of Service:  Completed, signed off  Medicare Important Message Given:  Yes-second notification given Date Medicare IM Given:    Medicare IM give by:    Date Additional Medicare IM Given:    Additional Medicare Important Message give by:     If discussed at Long Length of Stay Meetings, dates discussed:  10/23/14  Additional Comments:  Lanier ClamMahabir, Ancel Easler, RN 10/23/2014, 12:18 PM

## 2014-10-23 NOTE — Discharge Summary (Addendum)
Physician Discharge Summary  Jacqueline [name] ZOX:[id number] DOB: [birth year] DOA: 10/20/2014  PCP: [name], MD  Admit date: 10/20/2014 Discharge date: 10/23/2014  Time spent: 20 minutes  Recommendations for Outpatient Follow-up:  1. Follow up with PCP in 1-2 weeks 2. Please re-check renal panel and magnesium level within one week 3. Please ensure daily bowel movements  Discharge Diagnoses:  Principal Problem:   Hyponatremia Active Problems:   HTN (hypertension)   CAD (coronary artery disease)   Atrial fibrillation   SOB (shortness of breath)   Protein calorie malnutrition   Mixed vascular and neurodegenerative dementia   Depression   Pressure ulcer   Discharge Condition: Improved  Diet recommendation: Heart healthy  Filed Weights   10/20/14 1119 10/21/14 0522  Weight: 48.081 kg (106 lb) 49.5 kg (109 lb 2 oz)    History of present illness:  Please see admit h and p from 7/9 for details. Briefly, pt presented with generalized weakness, found to have severely low sodium of 121. The patient was admitted for further work up.  Hospital Course:  Hyponatremia - This is likely in the setting of dehydration. Also, patient was on Lexapro, which can cause hyponatremia, however she was beginning to be hyponatremic before she started the Lexapro, and has only been taking this for 2 days, which was unlikely to contribute. However, given continued hyponatremia, held Lexapro with improvement in sodium by the following day - Serum osmolality 263, urine osmolality 245, urine sodium 65 - Gentle hydration with normal saline with initial gradual improvement in sodium  - TSH normal and a.m. cortisol unremarkable at 16.4 - Na on day of d/c of 133  Generalized anxiety - Patient was visibly anxious in the ED - Initially continued Lexapro - Pt was on Xanax as needed while in the hospital - Given persistent hyponatremia, will held Lexapro  Hypertension - To resume her home  medications - IV hydralazine as needed for systolic blood pressure greater than 180 - She tends to be hypertensive as an outpatient, when she saw cardiology in May she was 218/108 in office  Mild dementia - Resumed her home medication  Coronary artery disease - Patient without chest pain, palpitations - Monitor on telemetry  Protein calorie malnutrition, severe - nutrition consulted - Nutritional supplements   Hypokalemia - Likely in the setting of poor by mouth intake  - Replaced - Would repeat renal panel within one week   Mild LFT elevation - Her AST and ALT are borderline elevated, as well as her lipase. She remains without GI symptoms and abdominal exam is fairly benign. - Cont to monitor  PAF - followed by cardiology as an outpatient, felt not to be an anticoagulation candidate, continue her home medications - CHA2DS2-VASc Score 4 - currently in sinus rhythm, monitor on telemetry   Discharge Exam: Filed Vitals:   10/22/14 1040 10/22/14 1325 10/22/14 2029 10/23/14 0442  BP: 130/62 146/66 139/106 183/73  Pulse: 50 55 60 48  Temp:  98.5 F (36.9 C) 97.8 F (36.6 C) 97.8 F (36.6 C)  TempSrc:  Oral Oral Oral  Resp:  20 20 16   Height:      Weight:      SpO2:  100% 100% 98%    General: Awake, in nad Cardiovascular: regular, s1, s2 Respiratory: normal resp effort, no wheezing  Discharge Instructions     Medication List    STOP taking these medications        escitalopram 10 MG tablet  Commonly known  as:  LEXAPRO     hydrochlorothiazide 12.5 MG capsule  Commonly known as:  MICROZIDE      TAKE these medications        alendronate 70 MG tablet  Commonly known as:  FOSAMAX  TAKE 1 TABLET BY MOUTH EVERY 7 DAYS. TAKE WITH A FULL GLASS OF WATER ON AN EMPTY STOMACH     amiodarone 200 MG tablet  Commonly known as:  PACERONE  TAKE 1 TABLET (200 MG TOTAL) BY MOUTH DAILY.     aspirin EC 325 MG tablet  Take 325 mg by mouth every evening.      atorvastatin 40 MG tablet  Commonly known as:  LIPITOR  TAKE 1 TABLET BY MOUTH DAILY AT 6 PM     desloratadine 5 MG tablet  Commonly known as:  CLARINEX  Take 1 tablet (5 mg total) by mouth daily.     diclofenac sodium 1 % Gel  Commonly known as:  VOLTAREN  Apply 4 g topically 4 (four) times daily.     fluticasone 50 MCG/ACT nasal spray  Commonly known as:  FLONASE  Place 2 sprays into both nostrils daily.     folic acid 1 MG tablet  Commonly known as:  FOLVITE  TAKE 1 TABLET (1 MG TOTAL) BY MOUTH DAILY.     lisinopril 40 MG tablet  Commonly known as:  PRINIVIL,ZESTRIL  Take 1 tablet (40 mg total) by mouth daily.     LUMIGAN 0.01 % Soln  Generic drug:  bimatoprost  PLACE 1 DROP INTO BOTH EYES AT BEDTIME.     memantine 10 MG tablet  Commonly known as:  NAMENDA  Take 1 tablet (10 mg total) by mouth 2 (two) times daily.     metoprolol tartrate 25 MG tablet  Commonly known as:  LOPRESSOR  Take 0.5 tablets (12.5 mg total) by mouth 2 (two) times daily.     multivitamin tablet  Take 0.5 tablets by mouth 2 (two) times daily.     polyethylene glycol packet  Commonly known as:  MIRALAX / GLYCOLAX  Take 17 g by mouth daily.     traMADol 50 MG tablet  Commonly known as:  ULTRAM  Take 50 mg by mouth every 8 (eight) hours as needed for moderate pain.     traMADol 50 MG tablet  Commonly known as:  ULTRAM  Take 1 tablet (50 mg total) by mouth every 8 (eight) hours as needed for moderate pain.       No Known Allergies Follow-up Information    Follow up with Neena RhymesKatherine Tabori, MD. Schedule an appointment as soon as possible for a visit in 1 week.   Specialty:  Family Medicine   Why:  Hospital follow up   Contact information:   [address] [place] [id number] [phone]        The results of significant diagnostics from this hospitalization (including imaging, microbiology, ancillary and laboratory) are listed below for reference.    Significant  Diagnostic Studies: Dg Chest 2 View  10/20/2014   CLINICAL DATA:  Increasing weakness and shortness of breath for 1 month.  EXAM: CHEST  2 VIEW  COMPARISON:  09/19/2014  FINDINGS: The cardiac silhouette, mediastinal and hilar contours are within normal limits and stable. There is mild tortuosity, ectasia and calcification of the thoracic aorta. Stable surgical changes from bypass surgery. All The lungs demonstrate chronic emphysematous changes and pulmonary scarring but no acute overlying pulmonary process. No pleural  effusion. The bony thorax is intact.  IMPRESSION: Chronic emphysematous changes and pulmonary scarring but no acute pulmonary findings.   Electronically Signed   By: Rudie Meyer M.D.   On: 10/20/2014 16:06   Dg Abd Portable 1v  10/22/2014   CLINICAL DATA:  Two day history of generalized abdominal pain and constipation  EXAM: PORTABLE ABDOMEN - 1 VIEW  COMPARISON:  CT abdomen and pelvis November 07, 2012  FINDINGS: There is moderate stool throughout the colon. There is no bowel dilatation or air-fluid level suggesting obstruction. No free air is seen on this supine examination. There is a filter in the inferior vena cava with the apex at the level of L2. There is a total hip prosthesis the right. Bones are osteoporotic.  IMPRESSION: Moderate stool throughout colon. Overall bowel gas pattern unremarkable.   Electronically Signed   By: Bretta Bang III M.D.   On: 10/22/2014 15:33    Microbiology: No results found for this or any previous visit (from the past 240 hour(s)).   Labs: Basic Metabolic Panel:  Recent Labs Lab 10/20/14 2300 10/21/14 0510 10/21/14 1118 10/22/14 0415 10/23/14 0421  NA 127* 127* 127* 127* 133*  K 3.2* 3.3* 3.3* 3.5 3.0*  CL 97* 97* 97* 98* 99*  CO2 GLUCOSE 96 94 125* 100* 98  BUN CREATININE 0.49 0.50 0.75 0.57 0.55  CALCIUM 8.2* 8.0* 8.5* 8.3* 8.4*   Liver Function Tests:  Recent Labs Lab 10/20/14 1145 10/21/14 0510   AST 62* 58*  ALT 81* 76*  ALKPHOS 93 85  BILITOT 0.9 0.8  PROT 6.9 6.0*  ALBUMIN 3.8 3.3*    Recent Labs Lab 10/20/14 1145  LIPASE 57*   No results for input(s): AMMONIA in the last 168 hours. CBC:  Recent Labs Lab 10/20/14 1145 10/21/14 0510  WBC 6.0 6.1  NEUTROABS 4.9  --   HGB 13.0 12.0  HCT 37.7 34.9*  MCV 96.4 97.5  PLT 265 242   Cardiac Enzymes: No results for input(s): CKTOTAL, CKMB, CKMBINDEX, TROPONINI in the last 168 hours. BNP: BNP (last 3 results)  Recent Labs  07/19/14 1818  BNP 191.0*    ProBNP (last 3 results) No results for input(s): PROBNP in the last 8760 hours.  CBG: No results for input(s): GLUCAP in the last 168 hours.   Signed:  [name], Scheryl Marten  [facility] 10/23/2014, 12:21 PM

## 2014-10-24 ENCOUNTER — Telehealth: Payer: Self-pay | Admitting: *Deleted

## 2014-10-24 ENCOUNTER — Ambulatory Visit: Payer: Commercial Managed Care - HMO | Admitting: Physician Assistant

## 2014-10-24 ENCOUNTER — Non-Acute Institutional Stay (SKILLED_NURSING_FACILITY): Payer: Commercial Managed Care - HMO | Admitting: Adult Health

## 2014-10-24 ENCOUNTER — Encounter: Payer: Self-pay | Admitting: Adult Health

## 2014-10-24 DIAGNOSIS — K5900 Constipation, unspecified: Secondary | ICD-10-CM

## 2014-10-24 DIAGNOSIS — E785 Hyperlipidemia, unspecified: Secondary | ICD-10-CM

## 2014-10-24 DIAGNOSIS — E876 Hypokalemia: Secondary | ICD-10-CM

## 2014-10-24 DIAGNOSIS — E871 Hypo-osmolality and hyponatremia: Secondary | ICD-10-CM | POA: Diagnosis not present

## 2014-10-24 DIAGNOSIS — I480 Paroxysmal atrial fibrillation: Secondary | ICD-10-CM | POA: Diagnosis not present

## 2014-10-24 DIAGNOSIS — I2510 Atherosclerotic heart disease of native coronary artery without angina pectoris: Secondary | ICD-10-CM | POA: Diagnosis not present

## 2014-10-24 DIAGNOSIS — E46 Unspecified protein-calorie malnutrition: Secondary | ICD-10-CM | POA: Diagnosis not present

## 2014-10-24 DIAGNOSIS — M810 Age-related osteoporosis without current pathological fracture: Secondary | ICD-10-CM

## 2014-10-24 DIAGNOSIS — F0390 Unspecified dementia without behavioral disturbance: Secondary | ICD-10-CM

## 2014-10-24 DIAGNOSIS — J309 Allergic rhinitis, unspecified: Secondary | ICD-10-CM | POA: Diagnosis not present

## 2014-10-24 DIAGNOSIS — I10 Essential (primary) hypertension: Secondary | ICD-10-CM

## 2014-10-24 DIAGNOSIS — R5381 Other malaise: Secondary | ICD-10-CM

## 2014-10-24 DIAGNOSIS — I2583 Coronary atherosclerosis due to lipid rich plaque: Secondary | ICD-10-CM

## 2014-10-24 NOTE — Progress Notes (Signed)
Patient ID: Jacqueline LimesJacqueline A Dilger, female   DOB: 1928/08/14, 81 y.o.   MRN: [id number]   10/24/2014  Facility:  Nursing Home Location:  [facility] Room Number: 802-1 LEVEL OF CARE:  SNF (31)   Chief Complaint  Patient presents with  . Hospitalization Follow-up    Physical deconditioning, hyponatremia, hypertension, dementia, CAD, protein calorie malnutrition, hypokalemia, PAF, constipation, osteoporosis, allergic rhinitis and hyperlipidemia    HISTORY OF PRESENT ILLNESS:  This is an 81 year old female who has been [facility] 10[facility]. She has PMH of mild dementia, CAD S/P CABG 5, remote PAF on no anticoagulation given her frail habitus and history of dementia. She presented to the ED with anxiety and weakness. She was diagnosed with severely low sodium 121 and was likely due to dehydration. Lexapro was held and gentle hydration with normal saline. Sodium has improved, 133 upon discharge.  She has been admitted for a short-term rehabilitation.  PAST MEDICAL HISTORY:  Past Medical History  Diagnosis Date  . Diverticulitis   . Hypertension   . Osteoporosis   . CAD (coronary artery disease)     CURRENT MEDICATIONS: Reviewed per MAR/see medication list  No Known Allergies   REVIEW OF SYSTEMS:  GENERAL: no change in appetite, no fatigue, no weight changes, no fever, chills or weakness RESPIRATORY: no cough, SOB, DOE, wheezing, hemoptysis CARDIAC: no chest pain, edema or palpitations GI: no abdominal pain, diarrhea, constipation, heart burn, nausea or vomiting  PHYSICAL EXAMINATION  GENERAL: no acute distress, normal body habitus EYES: conjunctivae normal, sclerae normal, normal eye lids NECK: supple, trachea midline, no neck masses, no thyroid tenderness, no thyromegaly LYMPHATICS: no LAN in the neck, no supraclavicular LAN RESPIRATORY: breathing is even & unlabored, BS CTAB CARDIAC: RRR, no murmur,no  extra heart sounds, no edema GI: abdomen soft, normal BS, no masses, no tenderness, no hepatomegaly, no splenomegaly EXTREMITIES:  able to move 4 extremities  PSYCHIATRIC: the patient is alert & oriented to person, affect & behavior appropriate  LABS/RADIOLOGY: Labs reviewed: Basic Metabolic Panel:  Recent Labs  40/98/1106/01/26 1118 10/22/14 0415 10/23/14 0421  NA 127* 127* 133*  K 3.3* 3.5 3.0*  CL 97* 98* 99*  CO2 24 24 28   GLUCOSE 125* 100* 98  BUN 9 16 12   CREATININE 0.75 0.57 0.55  CALCIUM 8.5* 8.3* 8.4*   Liver Function Tests:  Recent Labs  09/19/14 1119 10/20/14 1145 10/21/14 0510  AST 32 62* 58*  ALT 33 81* 76*  ALKPHOS 85 93 85  BILITOT 0.6 0.9 0.8  PROT 6.8 6.9 6.0*  ALBUMIN 3.6 3.8 3.3*    Recent Labs  10/20/14 1145  LIPASE 57*   CBC:  Recent Labs  02/05/14 0900 07/19/14 1818  10/16/14 1105 10/20/14 1145 10/21/14 0510  WBC 6.7 6.6  --  7.4 6.0 6.1  NEUTROABS 5.0 4.8  --   --  4.9  --   HGB 13.1 12.6  < > 12.6 13.0 12.0  HCT 39.6 38.5  < > 37.3 37.7 34.9*  MCV 99.8 103.2*  --  100.9* 96.4 97.5  PLT 257.0 234  --  248.0 265 242  < > = values in this interval not displayed.  Lipid Panel:  Recent Labs  02/05/14 0900  HDL 60.20    Dg Chest 2 View  10/20/2014   CLINICAL DATA:  Increasing weakness and shortness of breath for 1 month.  EXAM: CHEST  2 VIEW  COMPARISON:  09/19/2014  FINDINGS: The cardiac silhouette, mediastinal and hilar contours are within normal limits and stable. There is mild tortuosity, ectasia and calcification of the thoracic aorta. Stable surgical changes from bypass surgery. All The lungs demonstrate chronic emphysematous changes and pulmonary scarring but no acute overlying pulmonary process. No pleural effusion. The bony thorax is intact.  IMPRESSION: Chronic emphysematous changes and pulmonary scarring but no acute pulmonary findings.   Electronically Signed   By: Rudie Meyer M.D.   On: 10/20/2014 16:06   Dg Abd Portable  1v  10/22/2014   CLINICAL DATA:  Two day history of generalized abdominal pain and constipation  EXAM: PORTABLE ABDOMEN - 1 VIEW  COMPARISON:  CT abdomen and pelvis November 07, 2012  FINDINGS: There is moderate stool throughout the colon. There is no bowel dilatation or air-fluid level suggesting obstruction. No free air is seen on this supine examination. There is a filter in the inferior vena cava with the apex at the level of L2. There is a total hip prosthesis the right. Bones are osteoporotic.  IMPRESSION: Moderate stool throughout colon. Overall bowel gas pattern unremarkable.   Electronically Signed   By: Bretta Bang III M.D.   On: 10/22/2014 15:33    ASSESSMENT/PLAN:  Physical deconditioning - for rehabilitation  Hyponatremia - sodium 133; check BMP  Hypertension  - continue Lopressor 12.5 mg by mouth twice a day and lisinopril 40 mg 1 tab by mouth daily; check CBC  Dementia - continue Namenda 10 mg by mouth twice a day  CAD - stable; continue aspirin 325 mg by mouth daily  Protein calorie malnutrition - albumen 3.3; continue supplementation  Hypokalemia - K3.0; will monitor  PAF - rate controlled; continue aspirin 325 mg by mouth daily, amiodarone 200 mg 1 tab by mouth daily and Lopressor 12.5 mg by mouth twice a day  Constipation - continue MiraLAX 17 g +4-6 ounces liquid by mouth daily  Osteoporosis - continue Fosamax 70 mg 1 tab by mouth every week  Allergic rhinitis - continue Clarinex 5 mg 1 tab by mouth daily and Flonase 50 g/ACT 2 nasal sprays into nares daily  Hyperlipidemia - continue Lipitor 40 mg 1 tab by mouth daily     Goals of care:  Short-term rehabilitation    [facility], NP [facility] [phone]

## 2014-10-24 NOTE — Telephone Encounter (Signed)
Spoke with patient's son who stated that [facility].  She was having issues with leg weakness and balance issues while she was in the hospital.  Notified provider.  Unable to do TCM call.

## 2014-10-26 ENCOUNTER — Non-Acute Institutional Stay (SKILLED_NURSING_FACILITY): Payer: Commercial Managed Care - HMO | Admitting: Internal Medicine

## 2014-10-26 DIAGNOSIS — M179 Osteoarthritis of knee, unspecified: Secondary | ICD-10-CM

## 2014-10-26 DIAGNOSIS — I2510 Atherosclerotic heart disease of native coronary artery without angina pectoris: Secondary | ICD-10-CM | POA: Diagnosis not present

## 2014-10-26 DIAGNOSIS — K5901 Slow transit constipation: Secondary | ICD-10-CM

## 2014-10-26 DIAGNOSIS — I2583 Coronary atherosclerosis due to lipid rich plaque: Secondary | ICD-10-CM

## 2014-10-26 DIAGNOSIS — E871 Hypo-osmolality and hyponatremia: Secondary | ICD-10-CM | POA: Diagnosis not present

## 2014-10-26 DIAGNOSIS — F0150 Vascular dementia without behavioral disturbance: Secondary | ICD-10-CM

## 2014-10-26 DIAGNOSIS — M810 Age-related osteoporosis without current pathological fracture: Secondary | ICD-10-CM

## 2014-10-26 DIAGNOSIS — F0391 Unspecified dementia with behavioral disturbance: Secondary | ICD-10-CM | POA: Diagnosis not present

## 2014-10-26 DIAGNOSIS — R531 Weakness: Secondary | ICD-10-CM

## 2014-10-26 DIAGNOSIS — I480 Paroxysmal atrial fibrillation: Secondary | ICD-10-CM | POA: Diagnosis not present

## 2014-10-26 DIAGNOSIS — F0390 Unspecified dementia without behavioral disturbance: Secondary | ICD-10-CM | POA: Diagnosis not present

## 2014-10-26 DIAGNOSIS — I10 Essential (primary) hypertension: Secondary | ICD-10-CM

## 2014-10-26 DIAGNOSIS — M1710 Unilateral primary osteoarthritis, unspecified knee: Secondary | ICD-10-CM

## 2014-10-26 DIAGNOSIS — IMO0002 Reserved for concepts with insufficient information to code with codable children: Secondary | ICD-10-CM

## 2014-10-26 DIAGNOSIS — E46 Unspecified protein-calorie malnutrition: Secondary | ICD-10-CM

## 2014-10-26 NOTE — Progress Notes (Signed)
Patient ID: Jacqueline [name], female   DOB: [birth year], 81 y.o.   MRN: [id number]      [facility]   PCP: [name], MD  Code Status: DNR  No Known Allergies  Chief Complaint  Patient presents with  . New Admit To SNF     HPI:  81 y.o. patient is here for short term rehabilitation post hospital admission from 10/20/14-10/23/14 with generalized weakness, hyponatremia and anxiety disorder. She responded well to iv fluids and is here for rehabilitation. She is seen in her room today. She worked with therapy team this am. She complaints of being tired. She also has been constipated. Has been passing flatus. Denies abdominal pain, nausea or vomiting. She has PMH of mild dementia, afib not on anticoagulation with fall risk, CAD s/p CABG.   Review of Systems:  Constitutional: Negative for fever, chills, diaphoresis.  HENT: Negative for headache, congestion, nasal discharge Eyes: Negative for double vision and discharge.  Respiratory: Negative for cough, wheezing.  has shortness of breath with exertion. Cardiovascular: Negative for chest pain, palpitations, leg swelling.  Gastrointestinal: Negative for heartburn, nausea, vomiting, abdominal pain.  Genitourinary: Negative for dysuria, flank pain.  Musculoskeletal: Negative for back pain, falls Skin: Negative for itching, rash.  Neurological: Negative for dizziness, tingling, focal weakness Psychiatric/Behavioral: Negative for depression.    Past Medical History  Diagnosis Date  . Diverticulitis   . Hypertension   . Osteoporosis   . CAD (coronary artery disease)    Past Surgical History  Procedure Laterality Date  . Fracture surgery    . Partial colectomy    . Coronary artery bypass graft N/A 08/25/2012    Procedure: CORONARY ARTERY BYPASS GRAFTING ;  Surgeon: Alleen Borne, MD;  Location: [facility] OR;  Service: Open Heart Surgery;  Laterality: N/A;  four bypasses total   . Left heart  catheterization with coronary angiogram N/A 08/22/2012    Procedure: LEFT HEART CATHETERIZATION WITH CORONARY ANGIOGRAM;  Surgeon: Peter M Swaziland, MD;  Location: [facility] CATH LAB;  Service: Cardiovascular;  Laterality: N/A;   Social History:   reports that she has quit smoking. Her smoking use included Cigarettes. She has never used smokeless tobacco. She reports that she does not drink alcohol or use illicit drugs.  Family History  Problem Relation Age of Onset  . Hypertension Other   . Cancer Mother     stomach  . Diabetes Neg Hx   . Heart disease Neg Hx   . Stroke Neg Hx     Medications:   Medication List       This list is accurate as of: 10/26/14 12:38 PM.  Always use your most recent med list.               alendronate 70 MG tablet  Commonly known as:  FOSAMAX  TAKE 1 TABLET BY MOUTH EVERY 7 DAYS. TAKE WITH A FULL GLASS OF WATER ON AN EMPTY STOMACH     amiodarone 200 MG tablet  Commonly known as:  PACERONE  TAKE 1 TABLET (200 MG TOTAL) BY MOUTH DAILY.     aspirin EC 325 MG tablet  Take 325 mg by mouth every evening.     atorvastatin 40 MG tablet  Commonly known as:  LIPITOR  TAKE 1 TABLET BY MOUTH DAILY AT 6 PM     desloratadine 5 MG tablet  Commonly known as:  CLARINEX  Take 1 tablet (5 mg total) by mouth daily.  diclofenac sodium 1 % Gel  Commonly known as:  VOLTAREN  Apply 4 g topically 4 (four) times daily.     fluticasone 50 MCG/ACT nasal spray  Commonly known as:  FLONASE  Place 2 sprays into both nostrils daily.     folic acid 1 MG tablet  Commonly known as:  FOLVITE  TAKE 1 TABLET (1 MG TOTAL) BY MOUTH DAILY.     lisinopril 40 MG tablet  Commonly known as:  PRINIVIL,ZESTRIL  Take 1 tablet (40 mg total) by mouth daily.     LUMIGAN 0.01 % Soln  Generic drug:  bimatoprost  PLACE 1 DROP INTO BOTH EYES AT BEDTIME.     memantine 10 MG tablet  Commonly known as:  NAMENDA  Take 1 tablet (10 mg total) by mouth 2 (two) times daily.     metoprolol  tartrate 25 MG tablet  Commonly known as:  LOPRESSOR  Take 0.5 tablets (12.5 mg total) by mouth 2 (two) times daily.     multivitamin tablet  Take 0.5 tablets by mouth 2 (two) times daily.     polyethylene glycol packet  Commonly known as:  MIRALAX / GLYCOLAX  Take 17 g by mouth daily.     traMADol 50 MG tablet  Commonly known as:  ULTRAM  Take 50 mg by mouth every 8 (eight) hours as needed for moderate pain.     traMADol 50 MG tablet  Commonly known as:  ULTRAM  Take 1 tablet (50 mg total) by mouth every 8 (eight) hours as needed for moderate pain.         Physical Exam: Filed Vitals:   10/26/14 1238  BP: 136/40  Pulse: 66  Temp: 97.8 F (36.6 C)  Resp: 18  SpO2: 97%    General- elderly female, thin built, in no acute distress Head- normocephalic, atraumatic Throat- moist mucus membrane  Eyes- no pallor, no icterus, no discharge, normal conjunctiva, normal sclera Neck- no cervical lymphadenopathy, no jugular vein distension Cardiovascular- normal s1,s2, systolic murmurs present, no leg edema  Respiratory- bilateral clear to auscultation, no wheeze, no rhonchi, no crackles, no use of accessory muscles Abdomen- bowel sounds present, soft, non tender Musculoskeletal- able to move all 4 extremities, on wheelchair, generalized weakness Neurological- no focal deficit, alert and oriented to person and place Skin- warm and dry Psychiatry- normal mood and affect   Labs reviewed: Basic Metabolic Panel:  Recent Labs  57/84/69 1118 10/22/14 0415 10/23/14 0421  NA 127* 127* 133*  K 3.3* 3.5 3.0*  CL 97* 98* 99*  CO2 24 24 28   GLUCOSE 125* 100* 98  BUN 9 16 12   CREATININE 0.75 0.57 0.55  CALCIUM 8.5* 8.3* 8.4*   Liver Function Tests:  Recent Labs  09/19/14 1119 10/20/14 1145 10/21/14 0510  AST 32 62* 58*  ALT 33 81* 76*  ALKPHOS 85 93 85  BILITOT 0.6 0.9 0.8  PROT 6.8 6.9 6.0*  ALBUMIN 3.6 3.8 3.3*    Recent Labs  10/20/14 1145  LIPASE 57*   No  results for input(s): AMMONIA in the last 8760 hours. CBC:  Recent Labs  02/05/14 0900 07/19/14 1818  10/16/14 1105 10/20/14 1145 10/21/14 0510  WBC 6.7 6.6  --  7.4 6.0 6.1  NEUTROABS 5.0 4.8  --   --  4.9  --   HGB 13.1 12.6  < > 12.6 13.0 12.0  HCT 39.6 38.5  < > 37.3 37.7 34.9*  MCV 99.8 103.2*  --  100.9*  96.4 97.5  PLT 257.0 234  --  248.0 265 242  < > = values in this interval not displayed.   Radiological Exams: Dg Chest 2 View  10/20/2014   CLINICAL DATA:  Increasing weakness and shortness of breath for 1 month.  EXAM: CHEST  2 VIEW  COMPARISON:  09/19/2014  FINDINGS: The cardiac silhouette, mediastinal and hilar contours are within normal limits and stable. There is mild tortuosity, ectasia and calcification of the thoracic aorta. Stable surgical changes from bypass surgery. All The lungs demonstrate chronic emphysematous changes and pulmonary scarring but no acute overlying pulmonary process. No pleural effusion. The bony thorax is intact.  IMPRESSION: Chronic emphysematous changes and pulmonary scarring but no acute pulmonary findings.   Electronically Signed   By: Rudie MeyerP.  Gallerani M.D.   On: 10/20/2014 16:06    Dg Abd Portable 1v  10/22/2014   CLINICAL DATA:  Two day history of generalized abdominal pain and constipation  EXAM: PORTABLE ABDOMEN - 1 VIEW  COMPARISON:  CT abdomen and pelvis November 07, 2012  FINDINGS: There is moderate stool throughout the colon. There is no bowel dilatation or air-fluid level suggesting obstruction. No free air is seen on this supine examination. There is a filter in the inferior vena cava with the apex at the level of L2. There is a total hip prosthesis the right. Bones are osteoporotic.  IMPRESSION: Moderate stool throughout colon. Overall bowel gas pattern unremarkable.   Electronically Signed   By: Bretta BangWilliam  Woodruff III M.D.   On: 10/22/2014 15:33     Assessment/Plan   Physical deconditioning Will have her work with physical therapy and  occupational therapy team to help with gait training and muscle strengthening exercises.fall precautions. Skin care. Encourage to be out of bed.   Hyponatremia  sodium 133. Alert and oriented and at baseline, check bmp  Protein calorie malnutrition Continue MVI and folivite. Will need dietary supplements, pending dietary consult  Constipation Currently on miralax daily, change this to bid with senna s bid for now and change back to miralax daily once her normal bowel routine is obtained  Dementia At baseline, no behavioral disturbance reported, continue namenda 10 mg bid  Hypertension Stable bp reading, continue Lopressor 12.5 mg bid and lisinopril 40 mg daily. Monitor bp  afib Rate controlled. Continue amiodarone 200 mg daily and lopressor 12.5 mg bid. Continue aspirin 325 mg daily.  CAD  Remains chest pain free. Continue lopressor, lisinopril and aspirin as above with lipitor 40 mg daily  Osteoporosis continue Fosamax 70 mg weekly  OA Continue tramadol prn for pain with voltaren gel  Goals of care: short term rehabilitation   Labs/tests ordered: BMP  Family/ staff Communication: reviewed care plan with patient and nursing supervisor    Oneal GroutMAHIMA Mckenlee Mangham, MD  [facility] [phone] (Monday-Friday 8 am - 5 pm) [phone] (afterhours)

## 2014-11-14 ENCOUNTER — Ambulatory Visit: Payer: Commercial Managed Care - HMO | Admitting: Family Medicine

## 2014-11-15 ENCOUNTER — Non-Acute Institutional Stay (SKILLED_NURSING_FACILITY): Payer: Commercial Managed Care - HMO | Admitting: Adult Health

## 2014-11-15 DIAGNOSIS — E876 Hypokalemia: Secondary | ICD-10-CM

## 2014-11-15 DIAGNOSIS — E871 Hypo-osmolality and hyponatremia: Secondary | ICD-10-CM | POA: Diagnosis not present

## 2014-11-15 DIAGNOSIS — E785 Hyperlipidemia, unspecified: Secondary | ICD-10-CM | POA: Diagnosis not present

## 2014-11-15 DIAGNOSIS — R5381 Other malaise: Secondary | ICD-10-CM | POA: Diagnosis not present

## 2014-11-15 DIAGNOSIS — I10 Essential (primary) hypertension: Secondary | ICD-10-CM | POA: Diagnosis not present

## 2014-11-15 DIAGNOSIS — I2510 Atherosclerotic heart disease of native coronary artery without angina pectoris: Secondary | ICD-10-CM

## 2014-11-15 DIAGNOSIS — K5900 Constipation, unspecified: Secondary | ICD-10-CM | POA: Diagnosis not present

## 2014-11-15 DIAGNOSIS — E46 Unspecified protein-calorie malnutrition: Secondary | ICD-10-CM

## 2014-11-15 DIAGNOSIS — F0390 Unspecified dementia without behavioral disturbance: Secondary | ICD-10-CM

## 2014-11-15 DIAGNOSIS — J309 Allergic rhinitis, unspecified: Secondary | ICD-10-CM

## 2014-11-15 DIAGNOSIS — I2583 Coronary atherosclerosis due to lipid rich plaque: Secondary | ICD-10-CM

## 2014-11-15 DIAGNOSIS — M810 Age-related osteoporosis without current pathological fracture: Secondary | ICD-10-CM | POA: Diagnosis not present

## 2014-11-19 ENCOUNTER — Telehealth: Payer: Self-pay | Admitting: Family Medicine

## 2014-11-19 NOTE — Telephone Encounter (Signed)
Caller name:[facility] Relation to pt: Call back number:[phone] Pharmacy:  Reason for call: pt is needing orders for speech therapy and occupational therapy for evaluation. Verbal order please.

## 2014-11-19 NOTE — Telephone Encounter (Signed)
Ok for verbal orders to continue?

## 2014-11-19 NOTE — Telephone Encounter (Signed)
Ok to continue

## 2014-11-19 NOTE — Telephone Encounter (Signed)
Verbal orders given, pt dismissed from [facility] on Friday. Orders from [facility] are Speech Therapy, Occupational Therapy, and Home health aid.

## 2014-11-23 ENCOUNTER — Telehealth: Payer: Self-pay | Admitting: Family Medicine

## 2014-11-23 ENCOUNTER — Encounter: Payer: Self-pay | Admitting: Adult Health

## 2014-11-23 NOTE — Telephone Encounter (Signed)
Caller name: Group,Steven Relation to pt:son Call back number: [phone]   Reason for call:  Son has some concerns regarding medication prescribed while patient was being seen at [facility]

## 2014-11-23 NOTE — Telephone Encounter (Signed)
This all came in late on Friday. Could you please call her son first thing on Monday?

## 2014-11-26 NOTE — Telephone Encounter (Signed)
Ok to leave appt for 8/19 unless pt is symptomatic.  She has been seeing provider at [facility] while there and I have no record of Clonidine nor dose or how many times it was given.  It is usually best to let the doctor who is able to assess her handle her care (in this case, the nursing home doctor).  No changes at this time.

## 2014-11-26 NOTE — Telephone Encounter (Signed)
Caren Griffins at [phone] [facility]) *Preferred* and left message to return call when available.

## 2014-11-26 NOTE — Telephone Encounter (Signed)
FYI-  Patient's daughter states that she was given Clonidine at [facility] for episodes of elevated BP but when they took to pharmacy, the Rx did not include dosage, instructions, therefore pharmacist would not fill.  Daughter states she has a home BP monitor and is checking periodically and BP has not been high so far.  She has already scheduled an appointment on Friday 11/30/14.  Daughter states that she is not able to get in touch with [facility] to correct the Rx.    Notified daughter to please let us know if her BP increases or has headaches, chest pain, shortness of breath.  She stated understanding and agreed.    OK to leave apt for 11/30/14?

## 2014-11-26 NOTE — Telephone Encounter (Signed)
Caller name: Nieman,[name] Relation to pt: daughter in law  Call back number: [phone] #[phone]   Reason for call:  Returning your call

## 2014-11-28 ENCOUNTER — Ambulatory Visit: Payer: Commercial Managed Care - HMO | Admitting: Family Medicine

## 2014-11-30 ENCOUNTER — Encounter: Payer: Self-pay | Admitting: Family Medicine

## 2014-11-30 ENCOUNTER — Encounter: Payer: Self-pay | Admitting: General Practice

## 2014-11-30 ENCOUNTER — Ambulatory Visit (INDEPENDENT_AMBULATORY_CARE_PROVIDER_SITE_OTHER): Payer: Commercial Managed Care - HMO | Admitting: Family Medicine

## 2014-11-30 VITALS — BP 156/82 | HR 98 | Temp 97.9°F | Resp 16 | Ht 65.0 in | Wt 109.2 lb

## 2014-11-30 DIAGNOSIS — E871 Hypo-osmolality and hyponatremia: Secondary | ICD-10-CM

## 2014-11-30 DIAGNOSIS — I10 Essential (primary) hypertension: Secondary | ICD-10-CM | POA: Diagnosis not present

## 2014-11-30 DIAGNOSIS — K5901 Slow transit constipation: Secondary | ICD-10-CM

## 2014-11-30 LAB — BASIC METABOLIC PANEL
BUN: 13 mg/dL (ref 6–23)
CALCIUM: 9.3 mg/dL (ref 8.4–10.5)
CHLORIDE: 102 meq/L (ref 96–112)
CO2: 31 mEq/L (ref 19–32)
CREATININE: 0.7 mg/dL (ref 0.40–1.20)
GFR: 84.72 mL/min (ref >60.00)
Glucose, Bld: 102 mg/dL — ABNORMAL HIGH (ref 70–99)
Potassium: 4 mEq/L (ref 3.5–5.1)
SODIUM: 139 meq/L (ref 135–145)

## 2014-11-30 MED ORDER — METOPROLOL TARTRATE 25 MG PO TABS: 25.0000 mg | ORAL_TABLET | Freq: Two times a day (BID) | ORAL | Status: DC

## 2014-11-30 NOTE — Assessment & Plan Note (Signed)
Noted during recent hospitalization.  Pt's HCTZ and Lexapro were held.  Recheck labs today to determine if Na has normalized.

## 2014-11-30 NOTE — Progress Notes (Signed)
   Subjective:    Patient ID: Jacqueline [name], female    DOB: 21-Nov-1928, 81 y.o.   MRN: [id number]  HPI Rehab f/u- pt was hospitalized for weakness and hyponatremia.  After he hospitalization, she went to [facility] for rehab.  Pt is now back home as of 8/5.  Living w/ son and daughter in law.  At time of hospital d/c, Lexapro and HCTZ were stopped due to low Na.  Pt's BP was elevated after stopping HCTZ and pt was started on Clonidine PRN for BPs>200.  Home BPs range from 143-176/73-101.  No CP, SOB, palpitations, HAs, visual changes, edema.  Pt now getting home PT/OT.  Constipation- pt reports that a capful of Miralax is 'too much', 1/2 a cap was 'too much'.  Pt and daughter-in-law want to know how to dose it.   Review of Systems For ROS see HPI     Objective:   Physical Exam  Constitutional: She is oriented to person, place, and time. She appears well-developed and well-nourished. No distress.  HENT:  Head: Normocephalic and atraumatic.  Eyes: Conjunctivae and EOM are normal. Pupils are equal, round, and reactive to light.  Neck: Normal range of motion. Neck supple. No thyromegaly present.  Cardiovascular: Normal rate, regular rhythm and intact distal pulses.   Murmur (III-IV/VI SEM heard best over RUSB) heard. Pulmonary/Chest: Effort normal and breath sounds normal. No respiratory distress.  Abdominal: Soft. She exhibits no distension. There is no tenderness.  Musculoskeletal: She exhibits edema. Tenderness: trace bilateral LE edema.  Lymphadenopathy:    She has no cervical adenopathy.  Neurological: She is alert and oriented to person, place, and time.  Skin: Skin is warm and dry.  Psychiatric: She has a normal mood and affect. Her behavior is normal.  Vitals reviewed.         Assessment & Plan:

## 2014-11-30 NOTE — Patient Instructions (Signed)
Follow up in 1 month to recheck BP We'll notify you of your lab results and make any changes if needed Increase the Metoprolol to 1 tab twice daily Take a teaspoon of Miralax every Monday/Wed/Fri Drink plenty of fluids Call with any questions or concerns Hang in there!!!

## 2014-11-30 NOTE — Assessment & Plan Note (Signed)
Chronic problem.  BP is not at goal today but I don't feel that Clonidine is the next step for pt.  Will increase Metoprolol to  BID rather than add a new medication and continue the Lisinopril daily.  Check labs.  Pt expressed understanding and is in agreement w/ plan.

## 2014-11-30 NOTE — Progress Notes (Signed)
Pre visit review using our clinic review tool, if applicable. No additional management support is needed unless otherwise documented below in the visit note. 

## 2014-11-30 NOTE — Assessment & Plan Note (Signed)
Ongoing issue for pt.  Daily miralax causes diarrhea.  But pt is not aware enough to take as needed and will end up impacted.  Based on this, will do a teaspoon or Miralax every Monday/Wed/Fri.  Pt expressed understanding and is in agreement w/ plan.

## 2014-12-03 ENCOUNTER — Telehealth: Payer: Self-pay | Admitting: Family Medicine

## 2014-12-03 ENCOUNTER — Telehealth: Payer: Self-pay | Admitting: Behavioral Health

## 2014-12-03 NOTE — Telephone Encounter (Signed)
I would appreciate if PT would check BP and HR at each visit.  If pt's HR again drops below 50 and she is dizzy, we will decrease Metoprolol back to 1/2 tab twice daily

## 2014-12-03 NOTE — Telephone Encounter (Signed)
Caller: Beth at [facility], Physical Therapy Assistant  Reason for call: Low heart rate  Beth voiced that during the patient's physical therapy session she became dizzy, which is not normal for her. Patient's heart rate was 47 and sitting blood pressure 150/80 when taken. Per Waynetta Sandy, the patient is  not symptomatic at this time and she is aware that the patient's medication, Metoprolol was recently changed to a different dose on 11/30/14 to Metoprolol 25 mg bid and her symptoms started today.  Please advise.

## 2014-12-03 NOTE — Telephone Encounter (Signed)
error:[id number] ° °

## 2014-12-06 ENCOUNTER — Telehealth: Payer: Self-pay | Admitting: *Deleted

## 2014-12-06 DIAGNOSIS — M6281 Muscle weakness (generalized): Secondary | ICD-10-CM | POA: Diagnosis not present

## 2014-12-06 NOTE — Telephone Encounter (Signed)
Received home health certification and plan of care via fax from [facility] for certification period 11/19/14 - 01/17/15. Forwarded to Dr. Beverely Low. JG//CMA

## 2014-12-07 NOTE — Telephone Encounter (Signed)
Spoke with Clydie Braun (Physical Therapist Supervisor) at [facility] and informed her of the provider's recommendations below. She understood the instructions and voiced that she will provide Beth, the physical therapy assistant with this information as well.

## 2014-12-07 NOTE — Telephone Encounter (Signed)
Forms faxed successfully and sent for scanning. JG//CMA

## 2014-12-10 ENCOUNTER — Telehealth: Payer: Self-pay

## 2014-12-10 DIAGNOSIS — K862 Cyst of pancreas: Secondary | ICD-10-CM

## 2014-12-10 NOTE — Telephone Encounter (Signed)
Patient notified and she agrees to schedule for follow up pancreatic cyst She is scheduled for 12/21/14 5pm .  She is asked to arrive at 4:45 and be 4 hours NPO at [facility]

## 2014-12-10 NOTE — Telephone Encounter (Signed)
-----   Message from Rossie Muskrat, RN sent at 12/07/2013 11:28 AM EDT ----- Needs MRI in 12 months see results from 12/06/13

## 2014-12-21 ENCOUNTER — Ambulatory Visit (HOSPITAL_COMMUNITY)
Admission: RE | Admit: 2014-12-21 | Discharge: 2014-12-21 | Disposition: A | Discharge status: Home / Self Care | Payer: Commercial Managed Care - HMO | Source: Ambulatory Visit | Attending: Gastroenterology | Admitting: Gastroenterology

## 2014-12-21 DIAGNOSIS — K862 Cyst of pancreas: Secondary | ICD-10-CM | POA: Diagnosis present

## 2014-12-21 DIAGNOSIS — K760 Fatty (change of) liver, not elsewhere classified: Secondary | ICD-10-CM | POA: Diagnosis not present

## 2014-12-21 MED ORDER — GADOBENATE DIMEGLUMINE 529 MG/ML IV SOLN
10.0000 mL | Freq: Once | INTRAVENOUS | Status: AC | PRN
  Administered 2014-12-21: 10 mL via INTRAVENOUS

## 2014-12-25 ENCOUNTER — Telehealth: Payer: Self-pay | Admitting: Family Medicine

## 2014-12-25 NOTE — Telephone Encounter (Signed)
Have you seen these?

## 2014-12-25 NOTE — Telephone Encounter (Signed)
Caller name: Dzik,Steven Relation to pt: son Call back number: [phone]   Reason for call:  Son inquiring about MR/MRA CHEST/ABD/PEL results. Please advise

## 2014-12-26 NOTE — Telephone Encounter (Signed)
Called pt son and left a detailed message to inform that he would need to contact the GI office to get the MRI results. We cannot comment on imaging completed somewhere else.

## 2014-12-26 NOTE — Telephone Encounter (Signed)
This was done by GI.  They have left message for pt/family to return their call

## 2014-12-31 ENCOUNTER — Telehealth: Payer: Self-pay | Admitting: Family Medicine

## 2014-12-31 DIAGNOSIS — R410 Disorientation, unspecified: Secondary | ICD-10-CM

## 2014-12-31 DIAGNOSIS — F0151 Vascular dementia with behavioral disturbance: Secondary | ICD-10-CM

## 2014-12-31 DIAGNOSIS — F01518 Vascular dementia, unspecified severity, with other behavioral disturbance: Secondary | ICD-10-CM

## 2014-12-31 NOTE — Telephone Encounter (Signed)
Referral placed today

## 2014-12-31 NOTE — Telephone Encounter (Signed)
Caller name: Felecia at [facility] Can be reached: [phone]  Reason for call: Pt has appt tomorrow 01/01/15 at 10:45am with [facility] in [place], Dr. Shon Millet. Due to insurance they need referral/auth for services. Please follow up as needed.

## 2015-01-01 ENCOUNTER — Encounter: Payer: Self-pay | Admitting: Neurology

## 2015-01-01 ENCOUNTER — Ambulatory Visit (INDEPENDENT_AMBULATORY_CARE_PROVIDER_SITE_OTHER): Payer: Commercial Managed Care - HMO | Admitting: Neurology

## 2015-01-01 VITALS — BP 122/76 | HR 50 | Temp 97.9°F | Resp 16 | Ht 65.0 in | Wt 110.5 lb

## 2015-01-01 DIAGNOSIS — F0150 Vascular dementia without behavioral disturbance: Secondary | ICD-10-CM

## 2015-01-01 DIAGNOSIS — F0390 Unspecified dementia without behavioral disturbance: Secondary | ICD-10-CM

## 2015-01-01 DIAGNOSIS — F0391 Unspecified dementia with behavioral disturbance: Secondary | ICD-10-CM

## 2015-01-01 DIAGNOSIS — E785 Hyperlipidemia, unspecified: Secondary | ICD-10-CM

## 2015-01-01 DIAGNOSIS — I10 Essential (primary) hypertension: Secondary | ICD-10-CM | POA: Diagnosis not present

## 2015-01-01 NOTE — Progress Notes (Signed)
NEUROLOGY FOLLOW UP OFFICE NOTE  Jacqueline [name] [id number]  HISTORY OF PRESENT ILLNESS: [name] is an 81 year old woman with anxiety, hypertension and hearing loss who follows up for mixed vascular and Alzheimer's dementia.  She is accompanied by her son who provides some history.  hospital records and labs reviewed.  UPDATE: She currently takes Namenda  twice daily, aspirin  daily and Lipitor.  She resides at [facility].  She dresses, bathes and uses the toilet herself.  Her daughter-in-law helps her set up her medications.  She remains active and participates in exercise.  In July, she was hospitalized for hyponatremia.  She had a Na level of 121.  She was treated and Lexapro was discontinued.  At this time, depression isn't too much of an issue.  She notes some diarrhea.  Appetite is good.  She denies hallucinations.     HISTORY: She underwent coronary artery bypass graft x 5 in May 2014.  She began to have symptoms prior, but has gotten much worse since the procedure.  Her family first started noticing symptoms about two years ago.  She was forgetting how to perform certain tasks, such as using the TV remote.  She often repeats questions.  She needs help administering her medications.  She also says that she has difficulty figuring out what she wants to say.  She has strong history of anxiety and insomnia.  She will often fall asleep easily during the day but is easily awoken.  She lives with her son and daughter-in-law.  She is able to perform daily activities such as bathing and dressing.  She is able to cook and use the stove, but only able to use the stove when somebody else is in the house.  She no longer drives.  There is no noticeable difficulty recalling names or recognizing people.  She has not had any change in behavior or personality.  She is sometimes irritable, but not combative. No hallucinations or delusions.  She has gait issues since a right  hip replacement several years ago.  She has a shuffling gait and sometimes trips over things.  There are no abnormal movements noted.  No known family history of dementia.  She usually sits alone at home.  She avoids social situations due to anxiety.  She worries a lot, particularly with new situations.  She did not get any sleep last night in anticipation for today's visit.  She feels a little sad at times, but not tearful.  No behavioral or personality changes.  Aricept was discontinued due to vivid dreams.  Rivastigmine caused mood swings.  Galantamine was too expensive.  01/12/13 MRI Brain wo reviewed:  moderate atrophy and advanced chronic microvascular ischemic changes.  PAST MEDICAL HISTORY: Past Medical History  Diagnosis Date  . Diverticulitis   . Hypertension   . Osteoporosis   . CAD (coronary artery disease)     MEDICATIONS: Current Outpatient Prescriptions on File Prior to Visit  Medication Sig Dispense Refill  . alendronate (FOSAMAX) 70 MG tablet TAKE 1 TABLET BY MOUTH EVERY 7 DAYS. TAKE WITH A FULL GLASS OF WATER ON AN EMPTY STOMACH 12 tablet 1  . amiodarone (PACERONE) 200 MG tablet TAKE 1 TABLET (200 MG TOTAL) BY MOUTH DAILY. 30 tablet 3  . aspirin EC 325 MG tablet Take 325 mg by mouth every evening.     Marland Kitchen atorvastatin (LIPITOR) 40 MG tablet TAKE 1 TABLET BY MOUTH DAILY AT 6 PM 90 tablet 0  .  desloratadine (CLARINEX) 5 MG tablet Take 1 tablet (5 mg total) by mouth daily. 15 tablet 0  . fluticasone (FLONASE) 50 MCG/ACT nasal spray Place 2 sprays into both nostrils daily. 16 g 6  . folic acid (FOLVITE) 1 MG tablet TAKE 1 TABLET (1 MG TOTAL) BY MOUTH DAILY. 90 tablet 1  . lisinopril (PRINIVIL,ZESTRIL) 40 MG tablet Take 1 tablet (40 mg total) by mouth daily. 90 tablet 3  . LUMIGAN 0.01 % SOLN PLACE 1 DROP INTO BOTH EYES AT BEDTIME.  0  . memantine (NAMENDA) 10 MG tablet Take 1 tablet (10 mg total) by mouth 2 (two) times daily. 60 tablet 3  . metoprolol tartrate (LOPRESSOR) 25 MG  tablet Take 1 tablet (25 mg total) by mouth 2 (two) times daily. 180 tablet 1  . Multiple Vitamin (MULTIVITAMIN) tablet Take 0.5 tablets by mouth 2 (two) times daily.     . polyethylene glycol (MIRALAX / GLYCOLAX) packet Take 17 g by mouth daily. 14 each 0  . diclofenac sodium (VOLTAREN) 1 % GEL Apply 4 g topically 4 (four) times daily. (Patient not taking: Reported on 01/01/2015) 100 g 3  . traMADol (ULTRAM) 50 MG tablet Take 50 mg by mouth every 8 (eight) hours as needed for moderate pain.   0  . traMADol (ULTRAM) 50 MG tablet Take 1 tablet (50 mg total) by mouth every 8 (eight) hours as needed for moderate pain. (Patient not taking: Reported on 01/01/2015) 30 tablet 0   No current facility-administered medications on file prior to visit.    ALLERGIES: No Known Allergies  FAMILY HISTORY: Family History  Problem Relation Age of Onset  . Hypertension Other   . Cancer Mother     stomach  . Diabetes Neg Hx   . Heart disease Neg Hx   . Stroke Neg Hx     SOCIAL HISTORY: Social History   Social History  . Marital Status: Widowed    Spouse Name: N/A  . Number of Children: 2  . Years of Education: 13+   Occupational History  . Retired    Social History Main Topics  . Smoking status: Former Smoker    Types: Cigarettes  . Smokeless tobacco: Never Used  . Alcohol Use: No     Comment: Occasional  . Drug Use: No  . Sexual Activity: No   Other Topics Concern  . Not on file   Social History Narrative   Regular exercise-no   Caffeine Use-yes    REVIEW OF SYSTEMS: Constitutional: No fevers, chills, or sweats, no generalized fatigue, change in appetite Eyes: No visual changes, double vision, eye pain Ear, nose and throat: No hearing loss, ear pain, nasal congestion, sore throat Cardiovascular: No chest pain, palpitations Respiratory:  No shortness of breath at rest or with exertion, wheezes GastrointestinaI: No nausea, vomiting, diarrhea, abdominal pain, fecal  incontinence Genitourinary:  No dysuria, urinary retention or frequency Musculoskeletal:  No neck pain, back pain Integumentary: No rash, pruritus, skin lesions Neurological: as above Psychiatric: No depression, insomnia, anxiety Endocrine: No palpitations, fatigue, diaphoresis, mood swings, change in appetite, change in weight, increased thirst Hematologic/Lymphatic:  No anemia, purpura, petechiae. Allergic/Immunologic: no itchy/runny eyes, nasal congestion, recent allergic reactions, rashes  PHYSICAL EXAM: Filed Vitals:   01/01/15 1047  BP: 122/76  Pulse: 50  Temp: 97.9 F (36.6 C)  Resp: 16   General: No acute distress.   Head:  Normocephalic/atraumatic Eyes:  Fundoscopic exam unremarkable without vessel changes, exudates, hemorrhages or papilledema. Neck: supple, no  paraspinal tenderness, full range of motion Heart:  Regular rate and rhythm Lungs:  Clear to auscultation bilaterally Back: No paraspinal tenderness Neurological Exam: alert and oriented to person, place, and time. Attention span and concentration intact, recent memory mildly impaired, remote memory intact, fund of knowledge intact.  Visuospatial dysfunction as demonstrated on the Trail Making Test, copying a cube and drawing a clock.  Speech fluent and not dysarthric, language intact.   Montreal Cognitive Assessment  01/01/2015 01/02/2014  Visuospatial/ Executive (0/5) 2 1  Naming (0/3) 3 3  Attention: Read list of digits (0/2) 2 2  Attention: Read list of letters (0/1) 1 1  Attention: Serial 7 subtraction starting at 100 (0/3) 2 3  Language: Repeat phrase (0/2) 2 2  Language : Fluency (0/1) 0 1  Abstraction (0/2) 2 2  Delayed Recall (0/5) 3 0  Orientation (0/6) 6 6  Total 23 21  Adjusted Score (based on education) 23 21   CN II-XII intact. Fundi not visualized.  Bulk and tone normal, muscle strength 5/5 throughout.  Deep tendon reflexes 1+ throughout, toes downgoing.  Finger to nose intact.  Gait cautious and  unsteady with short stride.  IMPRESSION: Mixed vascular and Alzheimer's dementia Hypertension Hyperlipidemia  PLAN: Namenda  twice daily ASA and Lipitor (LDL goal less than 100) Follow up in one year.  Shon Millet, DO  CC:  [name], MD

## 2015-01-01 NOTE — Patient Instructions (Signed)
Continue aspirin, Namenda and Lipitor Follow up in one year

## 2015-01-01 NOTE — Telephone Encounter (Signed)
Firefighter # [id number]

## 2015-01-02 ENCOUNTER — Other Ambulatory Visit: Payer: Self-pay | Admitting: Family Medicine

## 2015-01-02 NOTE — Telephone Encounter (Signed)
Medication filled to pharmacy as requested.   

## 2015-01-04 ENCOUNTER — Ambulatory Visit: Payer: Commercial Managed Care - HMO | Admitting: Family Medicine

## 2015-01-09 ENCOUNTER — Other Ambulatory Visit: Payer: Self-pay | Admitting: Family Medicine

## 2015-01-09 NOTE — Telephone Encounter (Signed)
Medication filled to pharmacy as requested.   

## 2015-01-12 ENCOUNTER — Emergency Department (HOSPITAL_COMMUNITY)
Admission: EM | Admit: 2015-01-12 | Discharge: 2015-01-12 | Disposition: A | Discharge status: Home / Self Care | Payer: Commercial Managed Care - HMO | Attending: Emergency Medicine | Admitting: Emergency Medicine

## 2015-01-12 ENCOUNTER — Emergency Department (HOSPITAL_COMMUNITY): Payer: Commercial Managed Care - HMO

## 2015-01-12 ENCOUNTER — Encounter (HOSPITAL_COMMUNITY): Payer: Self-pay | Admitting: Emergency Medicine

## 2015-01-12 DIAGNOSIS — Z87891 Personal history of nicotine dependence: Secondary | ICD-10-CM | POA: Insufficient documentation

## 2015-01-12 DIAGNOSIS — M810 Age-related osteoporosis without current pathological fracture: Secondary | ICD-10-CM | POA: Insufficient documentation

## 2015-01-12 DIAGNOSIS — Z7951 Long term (current) use of inhaled steroids: Secondary | ICD-10-CM | POA: Insufficient documentation

## 2015-01-12 DIAGNOSIS — N390 Urinary tract infection, site not specified: Secondary | ICD-10-CM | POA: Insufficient documentation

## 2015-01-12 DIAGNOSIS — I10 Essential (primary) hypertension: Secondary | ICD-10-CM | POA: Diagnosis not present

## 2015-01-12 DIAGNOSIS — Z79899 Other long term (current) drug therapy: Secondary | ICD-10-CM | POA: Diagnosis not present

## 2015-01-12 DIAGNOSIS — R197 Diarrhea, unspecified: Secondary | ICD-10-CM | POA: Diagnosis not present

## 2015-01-12 DIAGNOSIS — R079 Chest pain, unspecified: Secondary | ICD-10-CM | POA: Insufficient documentation

## 2015-01-12 DIAGNOSIS — Z7982 Long term (current) use of aspirin: Secondary | ICD-10-CM | POA: Insufficient documentation

## 2015-01-12 DIAGNOSIS — Z9889 Other specified postprocedural states: Secondary | ICD-10-CM | POA: Diagnosis not present

## 2015-01-12 DIAGNOSIS — Z8781 Personal history of (healed) traumatic fracture: Secondary | ICD-10-CM | POA: Insufficient documentation

## 2015-01-12 DIAGNOSIS — R109 Unspecified abdominal pain: Secondary | ICD-10-CM | POA: Diagnosis present

## 2015-01-12 DIAGNOSIS — I2510 Atherosclerotic heart disease of native coronary artery without angina pectoris: Secondary | ICD-10-CM | POA: Insufficient documentation

## 2015-01-12 DIAGNOSIS — R630 Anorexia: Secondary | ICD-10-CM | POA: Insufficient documentation

## 2015-01-12 DIAGNOSIS — Z8719 Personal history of other diseases of the digestive system: Secondary | ICD-10-CM | POA: Insufficient documentation

## 2015-01-12 DIAGNOSIS — Z951 Presence of aortocoronary bypass graft: Secondary | ICD-10-CM | POA: Diagnosis not present

## 2015-01-12 LAB — HEPATIC FUNCTION PANEL
ALBUMIN: 3.6 g/dL (ref 3.5–5.0)
ALK PHOS: 79 U/L (ref 38–126)
ALT: 33 U/L (ref 14–54)
AST: 37 U/L (ref 15–41)
BILIRUBIN INDIRECT: 0.8 mg/dL (ref 0.3–0.9)
BILIRUBIN TOTAL: 1.1 mg/dL (ref 0.3–1.2)
Bilirubin, Direct: 0.3 mg/dL (ref 0.1–0.5)
Total Protein: 7.2 g/dL (ref 6.5–8.1)

## 2015-01-12 LAB — URINALYSIS, ROUTINE W REFLEX MICROSCOPIC
BILIRUBIN URINE: NEGATIVE
GLUCOSE, UA: NEGATIVE mg/dL
KETONES UR: NEGATIVE mg/dL
Nitrite: POSITIVE — AB
PH: 7.5 (ref 5.0–8.0)
PROTEIN: 30 mg/dL — AB
Specific Gravity, Urine: 1.011 (ref 1.005–1.030)
Urobilinogen, UA: 1 mg/dL (ref 0.0–1.0)

## 2015-01-12 LAB — LIPASE, BLOOD: LIPASE: 38 U/L (ref 22–51)

## 2015-01-12 LAB — BASIC METABOLIC PANEL
ANION GAP: 9 (ref 5–15)
BUN: 13 mg/dL (ref 6–20)
CALCIUM: 10 mg/dL (ref 8.9–10.3)
CO2: 26 mmol/L (ref 22–32)
Chloride: 100 mmol/L — ABNORMAL LOW (ref 101–111)
Creatinine, Ser: 0.73 mg/dL (ref 0.44–1.00)
GFR calc Af Amer: >60 mL/min (ref >60)
GLUCOSE: 120 mg/dL — AB (ref 65–99)
POTASSIUM: 4.1 mmol/L (ref 3.5–5.1)
Sodium: 135 mmol/L (ref 135–145)

## 2015-01-12 LAB — CBC
HEMATOCRIT: 42 % (ref 36.0–46.0)
HEMOGLOBIN: 13.7 g/dL (ref 12.0–15.0)
MCH: 34 pg (ref 26.0–34.0)
MCHC: 32.6 g/dL (ref 30.0–36.0)
MCV: 104.2 fL — ABNORMAL HIGH (ref 78.0–100.0)
Platelets: 231 10*3/uL (ref 150–400)
RBC: 4.03 MIL/uL (ref 3.87–5.11)
RDW: 15 % (ref 11.5–15.5)
WBC: 8.3 10*3/uL (ref 4.0–10.5)

## 2015-01-12 LAB — I-STAT TROPONIN, ED: Troponin i, poc: 0.01 ng/mL (ref 0.00–0.08)

## 2015-01-12 LAB — URINE MICROSCOPIC-ADD ON

## 2015-01-12 MED ORDER — AMIODARONE HCL 200 MG PO TABS
200.0000 mg | ORAL_TABLET | Freq: Every day | ORAL | Status: DC
  Filled 2015-01-12 (×2): qty 1

## 2015-01-12 MED ORDER — LISINOPRIL 20 MG PO TABS
40.0000 mg | ORAL_TABLET | Freq: Every day | ORAL | Status: DC
  Administered 2015-01-12: 40 mg via ORAL
  Filled 2015-01-12: qty 2

## 2015-01-12 MED ORDER — IOHEXOL 300 MG/ML  SOLN: 100.0000 mL | Freq: Once | INTRAMUSCULAR | Status: DC | PRN

## 2015-01-12 MED ORDER — OMEPRAZOLE 20 MG PO CPDR: 20.0000 mg | DELAYED_RELEASE_CAPSULE | Freq: Every day | ORAL | Status: DC

## 2015-01-12 MED ORDER — FENTANYL CITRATE (PF) 100 MCG/2ML IJ SOLN
25.0000 ug | Freq: Once | INTRAMUSCULAR | Status: AC
  Administered 2015-01-12: 25 ug via INTRAVENOUS
  Filled 2015-01-12: qty 2

## 2015-01-12 MED ORDER — CEPHALEXIN 500 MG PO CAPS: 500.0000 mg | ORAL_CAPSULE | Freq: Two times a day (BID) | ORAL | Status: DC

## 2015-01-12 MED ORDER — MEMANTINE HCL 10 MG PO TABS
10.0000 mg | ORAL_TABLET | Freq: Two times a day (BID) | ORAL | Status: DC
  Filled 2015-01-12 (×3): qty 1

## 2015-01-12 NOTE — ED Notes (Signed)
Called pharmacy to ask them to verify and send meds X2.

## 2015-01-12 NOTE — Discharge Instructions (Signed)

## 2015-01-12 NOTE — ED Notes (Addendum)
Pt c/o upper abdominal pain that radiates to back onset yesterday upon waking up. Pt also reports shortness of breath. Pt has history of Bypass and gallbladder issues

## 2015-01-12 NOTE — ED Provider Notes (Signed)
CSN: [id number]     Arrival date & time 01/12/15  0728 History   First MD Initiated Contact with Patient 01/12/15 [phone]     Chief Complaint  Patient presents with  . Chest Pain  . Abdominal Pain     (Consider location/radiation/quality/duration/timing/severity/associated sxs/prior Treatment) Patient is a 81 y.o. female presenting with chest pain and abdominal pain. The history is provided by the patient.  Chest Pain Associated symptoms: abdominal pain and back pain   Associated symptoms: no diaphoresis, no fever, no nausea, no numbness, no shortness of breath, not vomiting and no weakness   Abdominal Pain Associated symptoms: chest pain and diarrhea   Associated symptoms: no constipation, no fever, no nausea, no shortness of breath and no vomiting    patient presents with abdominal pain. Began around 24 hours ago. Worse after eating. Some relief with Tums.she's had some occasional diarrhea. No fevers. The pain is dull. Is worse with breathing and worse with certain movements. Does go to her back. No fevers or chills. Has a "shadow" on her pancreas that is being followed. She's not had pain like this before. She has a decreased appetite. No shortness of breath.   Past Medical History  Diagnosis Date  . Diverticulitis   . Hypertension   . Osteoporosis   . CAD (coronary artery disease)    Past Surgical History  Procedure Laterality Date  . Fracture surgery    . Partial colectomy    . Coronary artery bypass graft N/A 08/25/2012    Procedure: CORONARY ARTERY BYPASS GRAFTING ;  Surgeon: Alleen Borne, MD;  Location: [facility] OR;  Service: Open Heart Surgery;  Laterality: N/A;  four bypasses total   . Left heart catheterization with coronary angiogram N/A 08/22/2012    Procedure: LEFT HEART CATHETERIZATION WITH CORONARY ANGIOGRAM;  Surgeon: Peter M Swaziland, MD;  Location: [facility] CATH LAB;  Service: Cardiovascular;  Laterality: N/A;   Family History  Problem Relation Age of Onset  . Hypertension  Other   . Cancer Mother     stomach  . Diabetes Neg Hx   . Heart disease Neg Hx   . Stroke Neg Hx    Social History  Substance Use Topics  . Smoking status: Former Smoker    Types: Cigarettes  . Smokeless tobacco: Never Used  . Alcohol Use: No     Comment: Occasional   OB History    No data available     Review of Systems  Constitutional: Positive for appetite change. Negative for fever and diaphoresis.  Respiratory: Negative for shortness of breath.   Cardiovascular: Positive for chest pain.  Gastrointestinal: Positive for abdominal pain and diarrhea. Negative for nausea, vomiting, constipation and anal bleeding.  Musculoskeletal: Positive for back pain.  Skin: Negative for wound.  Neurological: Negative for weakness and numbness.      Allergies  Review of patient's allergies indicates no known allergies.  Home Medications   Prior to Admission medications   Medication Sig Start Date End Date Taking? Authorizing Provider  alendronate (FOSAMAX) 70 MG tablet TAKE 1 TABLET BY MOUTH EVERY 7 DAYS. TAKE WITH A FULL GLASS OF WATER ON AN EMPTY STOMACH 08/06/14  Yes Sheliah Hatch, MD  amiodarone (PACERONE) 200 MG tablet TAKE 1 TABLET BY MOUTH DAILY. 01/02/15  Yes Sheliah Hatch, MD  aspirin EC 325 MG tablet Take 325 mg by mouth every evening.    Yes Historical Provider, MD  atorvastatin (LIPITOR) 40 MG tablet TAKE 1 TABLET BY MOUTH  DAILY AT 6 PM 01/09/15  Yes Sheliah Hatch, MD  desloratadine (CLARINEX) 5 MG tablet Take 1 tablet (5 mg total) by mouth daily. 08/10/13  Yes Gilda Crease, MD  fluticasone (FLONASE) 50 MCG/ACT nasal spray Place 2 sprays into both nostrils daily. 10/05/13  Yes Sheliah Hatch, MD  folic acid (FOLVITE) 1 MG tablet TAKE 1 TABLET (1 MG TOTAL) BY MOUTH DAILY. 09/24/14  Yes Sheliah Hatch, MD  lisinopril (PRINIVIL,ZESTRIL) 40 MG tablet Take 1 tablet (40 mg total) by mouth daily. 09/11/14  Yes Lewayne Bunting, MD  LUMIGAN 0.01 % SOLN  PLACE 1 DROP INTO BOTH EYES AT BEDTIME. 08/29/14  Yes Historical Provider, MD  memantine (NAMENDA) 10 MG tablet Take 1 tablet (10 mg total) by mouth 2 (two) times daily. 09/19/14  Yes Drema Dallas, DO  metoprolol tartrate (LOPRESSOR) 25 MG tablet Take 1 tablet (25 mg total) by mouth 2 (two) times daily. 11/30/14  Yes Sheliah Hatch, MD  Multiple Vitamin (MULTIVITAMIN) tablet Take 0.5 tablets by mouth 2 (two) times daily.    Yes Historical Provider, MD  polyethylene glycol (MIRALAX / GLYCOLAX) packet Take 17 g by mouth daily. 10/23/14  Yes Jerald Kief, MD  cephALEXin (KEFLEX) 500 MG capsule Take 1 capsule (500 mg total) by mouth 2 (two) times daily. 01/12/15   Benjiman Core, MD  diclofenac sodium (VOLTAREN) 1 % GEL Apply 4 g topically 4 (four) times daily. Patient not taking: Reported on 01/01/2015 08/20/14   Sheliah Hatch, MD  omeprazole (PRILOSEC) 20 MG capsule Take 1 capsule (20 mg total) by mouth daily. 01/12/15   Benjiman Core, MD  traMADol (ULTRAM) 50 MG tablet Take 1 tablet (50 mg total) by mouth every 8 (eight) hours as needed for moderate pain. Patient not taking: Reported on 01/01/2015 10/23/14   Jerald Kief, MD   BP 155/74 mmHg  Pulse 50  Temp(Src) 97.6 F (36.4 C) (Oral)  Resp 18  Ht  (1.676 m)  Wt 110 lb (49.896 kg)  BMI 17.76 kg/m2  SpO2 95% Physical Exam  Constitutional: She appears well-developed.  Neck: Neck supple.  Cardiovascular: Regular rhythm.   Pulmonary/Chest: Effort normal.  Abdominal: Soft.  Mild epigastric tenderness without rebound or guarding.  Musculoskeletal: Normal range of motion. She exhibits no edema.  Neurological: She is alert.  Skin: Skin is warm.  Psychiatric: She has a normal mood and affect.    ED Course  Procedures (including critical care time) Labs Review Labs Reviewed  BASIC METABOLIC PANEL - Abnormal; Notable for the following:    Chloride 100 (*)    Glucose, Bld 120 (*)    All other components within normal limits   CBC - Abnormal; Notable for the following:    MCV 104.2 (*)    All other components within normal limits  URINALYSIS, ROUTINE W REFLEX MICROSCOPIC (NOT AT [facility]) - Abnormal; Notable for the following:    APPearance TURBID (*)    Hgb urine dipstick SMALL (*)    Protein, ur 30 (*)    Nitrite POSITIVE (*)    Leukocytes, UA LARGE (*)    All other components within normal limits  URINE MICROSCOPIC-ADD ON - Abnormal; Notable for the following:    Bacteria, UA FEW (*)    All other components within normal limits  URINE CULTURE  HEPATIC FUNCTION PANEL  LIPASE, BLOOD  I-STAT TROPOININ, ED    Imaging Review Dg Chest 2 View  01/12/2015   CLINICAL  DATA:  Upper abdominal and chest pain, hypertension, coronary disease  EXAM: CHEST - 2 VIEW  COMPARISON:  10/20/2014  FINDINGS: Previous CABG. Mild cardiomegaly. Ectatic atheromatous thoracic aorta. Patchy infiltrate or subsegmental atelectasis at the left lung base, partially obscuring the left diaphragmatic leaflet. Linear scar or atelectasis in the right suprahilar region. Lungs otherwise clear. No effusion. Severe compression deformity in the lower thoracic spine as before. Greenfield filter.  IMPRESSION: 1. Mild cardiomegaly. 2. Patchy left lower lobe infiltrate or atelectasis.   Electronically Signed   By: Corlis Leak M.D.   On: 01/12/2015 08:56   Ct Abdomen Pelvis W Contrast  01/12/2015   CLINICAL DATA:  Abdominal pain.  EXAM: CT ABDOMEN AND PELVIS WITH CONTRAST  TECHNIQUE: Multidetector CT imaging of the abdomen and pelvis was performed using the standard protocol following bolus administration of intravenous contrast.  CONTRAST:  100 mL Omnipaque 300 IV  COMPARISON:  MR abdomen 12/21/2014.  CT abdomen 11/07/2012  FINDINGS: Lower chest: Mild bibasilar atelectasis. No infiltrate or effusion. Cardiac enlargement. Coronary calcification. Mitral annulus calcification.  Hepatobiliary: Liver is homogeneous without mass lesion. Small gallstones. Gallbladder  wall not thickened. Bile ducts nondilated.  Pancreas: Multilocular cystic lesion tail of pancreas is stable from the prior MRI. This measures 12 x 18 mm with several small clustered cysts. Remainder of the pancreas is normal.  Spleen: Negative  Adrenals/Urinary Tract: Large 7 cm left renal cyst. Small right renal cyst and small right renal cortical scar. Negative for hydronephrosis. No renal calculi. Urinary bladder is negative.  Stomach/Bowel: Negative for bowel obstruction.  No bowel edema  Vascular/Lymphatic: IVC filter in good position. Atherosclerotic aorta and iliac arteries without aneurysm.  Reproductive: Small uterus without pelvic mass lesion. No adnexal mass.  Other: Negative for ascites.  No free fluid.  Musculoskeletal: Severe compression fracture T12. This fracture has progressed since 2014. There is retropulsion of bone into the canal causing mild to moderate spinal stenosis. Right hip prosthesis in satisfactory position and alignment.  IMPRESSION: Cholelithiasis.  Multi cystic mass in the tail of pancreas is stable.  Severe compression fracture T12 appears chronic but has progressed since 2014. This is causing mild to moderate spinal stenosis.   Electronically Signed   By: Marlan Palau M.D.   On: 01/12/2015 12:39   I have personally reviewed and evaluated these images and lab results as part of my medical decision-making.   EKG Interpretation   Date/Time:  Saturday January 12 2015 07:35:36 EDT Ventricular Rate:  50 PR Interval:  198 QRS Duration: 130 QT Interval:  476 QTC Calculation: 433 R Axis:   30 Text Interpretation:  Sinus bradycardia Right bundle branch block Abnormal  ECG Confirmed by Samie Reasons  MD, Prudy Candy [phone]) on 01/12/2015 7:50:52 AM      MDM   Final diagnoses:  UTI (lower urinary tract infection)    Patient with chest and abdominal pain. Has been feeling bad all over also. Has chronic compression fracture T12. Will need to follow with primary care doctor on  this. Also has gallstones but LFTs are normal and no real right upper quadrant tenderness. Has apparent UTI. Culture sent and will treat with antibiotics. Also short course of Protonix since and acids made pain improved.    Benjiman Core, MD 01/12/15 1326

## 2015-01-14 ENCOUNTER — Telehealth: Payer: Self-pay | Admitting: *Deleted

## 2015-01-14 LAB — URINE CULTURE

## 2015-01-14 NOTE — Telephone Encounter (Signed)
Pt is needing clearance for cataract extraction with intraocular lens implantation of the left eye followed by the right eye. Will forward for dr Jens Som review

## 2015-01-15 ENCOUNTER — Inpatient Hospital Stay (HOSPITAL_COMMUNITY)
Admission: EM | Admit: 2015-01-15 | Discharge: 2015-01-18 | DRG: 641 | Disposition: A | Discharge status: Home Health | Payer: Commercial Managed Care - HMO | Attending: Internal Medicine | Admitting: Internal Medicine

## 2015-01-15 ENCOUNTER — Emergency Department (HOSPITAL_COMMUNITY): Payer: Commercial Managed Care - HMO

## 2015-01-15 ENCOUNTER — Encounter (HOSPITAL_COMMUNITY): Payer: Self-pay | Admitting: Emergency Medicine

## 2015-01-15 DIAGNOSIS — Z951 Presence of aortocoronary bypass graft: Secondary | ICD-10-CM

## 2015-01-15 DIAGNOSIS — S22080S Wedge compression fracture of T11-T12 vertebra, sequela: Secondary | ICD-10-CM

## 2015-01-15 DIAGNOSIS — Z681 Body mass index (BMI) 19 or less, adult: Secondary | ICD-10-CM

## 2015-01-15 DIAGNOSIS — Z9181 History of falling: Secondary | ICD-10-CM

## 2015-01-15 DIAGNOSIS — Z87891 Personal history of nicotine dependence: Secondary | ICD-10-CM

## 2015-01-15 DIAGNOSIS — E785 Hyperlipidemia, unspecified: Secondary | ICD-10-CM | POA: Diagnosis present

## 2015-01-15 DIAGNOSIS — Z7982 Long term (current) use of aspirin: Secondary | ICD-10-CM

## 2015-01-15 DIAGNOSIS — I350 Nonrheumatic aortic (valve) stenosis: Secondary | ICD-10-CM

## 2015-01-15 DIAGNOSIS — K869 Disease of pancreas, unspecified: Secondary | ICD-10-CM | POA: Diagnosis present

## 2015-01-15 DIAGNOSIS — F0390 Unspecified dementia without behavioral disturbance: Secondary | ICD-10-CM | POA: Diagnosis present

## 2015-01-15 DIAGNOSIS — E871 Hypo-osmolality and hyponatremia: Secondary | ICD-10-CM | POA: Diagnosis not present

## 2015-01-15 DIAGNOSIS — R630 Anorexia: Secondary | ICD-10-CM

## 2015-01-15 DIAGNOSIS — K5900 Constipation, unspecified: Secondary | ICD-10-CM | POA: Diagnosis present

## 2015-01-15 DIAGNOSIS — R001 Bradycardia, unspecified: Secondary | ICD-10-CM | POA: Diagnosis present

## 2015-01-15 DIAGNOSIS — I10 Essential (primary) hypertension: Secondary | ICD-10-CM | POA: Diagnosis present

## 2015-01-15 DIAGNOSIS — M4854XA Collapsed vertebra, not elsewhere classified, thoracic region, initial encounter for fracture: Secondary | ICD-10-CM | POA: Diagnosis present

## 2015-01-15 DIAGNOSIS — R011 Cardiac murmur, unspecified: Secondary | ICD-10-CM | POA: Diagnosis present

## 2015-01-15 DIAGNOSIS — E861 Hypovolemia: Secondary | ICD-10-CM | POA: Diagnosis present

## 2015-01-15 DIAGNOSIS — N390 Urinary tract infection, site not specified: Secondary | ICD-10-CM | POA: Diagnosis not present

## 2015-01-15 DIAGNOSIS — Z79899 Other long term (current) drug therapy: Secondary | ICD-10-CM

## 2015-01-15 DIAGNOSIS — Z7983 Long term (current) use of bisphosphonates: Secondary | ICD-10-CM

## 2015-01-15 DIAGNOSIS — R531 Weakness: Secondary | ICD-10-CM

## 2015-01-15 DIAGNOSIS — I480 Paroxysmal atrial fibrillation: Secondary | ICD-10-CM | POA: Diagnosis present

## 2015-01-15 DIAGNOSIS — S22080A Wedge compression fracture of T11-T12 vertebra, initial encounter for closed fracture: Secondary | ICD-10-CM | POA: Diagnosis present

## 2015-01-15 DIAGNOSIS — E876 Hypokalemia: Secondary | ICD-10-CM | POA: Diagnosis present

## 2015-01-15 DIAGNOSIS — I2510 Atherosclerotic heart disease of native coronary artery without angina pectoris: Secondary | ICD-10-CM | POA: Diagnosis present

## 2015-01-15 DIAGNOSIS — M810 Age-related osteoporosis without current pathological fracture: Secondary | ICD-10-CM | POA: Diagnosis present

## 2015-01-15 DIAGNOSIS — Z800 Family history of malignant neoplasm of digestive organs: Secondary | ICD-10-CM

## 2015-01-15 HISTORY — DX: Paroxysmal atrial fibrillation: I48.0

## 2015-01-15 HISTORY — DX: Unspecified dementia, unspecified severity, without behavioral disturbance, psychotic disturbance, mood disturbance, and anxiety: F03.90

## 2015-01-15 HISTORY — DX: Gastro-esophageal reflux disease without esophagitis: K21.9

## 2015-01-15 HISTORY — DX: Hyperlipidemia, unspecified: E78.5

## 2015-01-15 LAB — URINALYSIS, ROUTINE W REFLEX MICROSCOPIC
Bilirubin Urine: NEGATIVE
Glucose, UA: NEGATIVE mg/dL
Ketones, ur: NEGATIVE mg/dL
Nitrite: POSITIVE — AB
Protein, ur: NEGATIVE mg/dL
Specific Gravity, Urine: 1.007 (ref 1.005–1.030)
Urobilinogen, UA: 1 mg/dL (ref 0.0–1.0)
pH: 7 (ref 5.0–8.0)

## 2015-01-15 LAB — CBC WITH DIFFERENTIAL/PLATELET
BASOS ABS: 0 10*3/uL (ref 0.0–0.1)
BASOS PCT: 0 %
EOS ABS: 0.1 10*3/uL (ref 0.0–0.7)
Eosinophils Relative: 1 %
HEMATOCRIT: 37.1 % (ref 36.0–46.0)
Hemoglobin: 13.2 g/dL (ref 12.0–15.0)
Lymphocytes Relative: 9 %
Lymphs Abs: 0.9 10*3/uL (ref 0.7–4.0)
MCH: 35 pg — ABNORMAL HIGH (ref 26.0–34.0)
MCHC: 35.6 g/dL (ref 30.0–36.0)
MCV: 98.4 fL (ref 78.0–100.0)
MONO ABS: 1 10*3/uL (ref 0.1–1.0)
Monocytes Relative: 10 %
NEUTROS ABS: 8.4 10*3/uL — AB (ref 1.7–7.7)
NEUTROS PCT: 80 %
Platelets: 175 10*3/uL (ref 150–400)
RBC: 3.77 MIL/uL — ABNORMAL LOW (ref 3.87–5.11)
RDW: 14.2 % (ref 11.5–15.5)
WBC: 10.3 10*3/uL (ref 4.0–10.5)

## 2015-01-15 LAB — COMPREHENSIVE METABOLIC PANEL
ALBUMIN: 3.3 g/dL — AB (ref 3.5–5.0)
ALT: 32 U/L (ref 14–54)
ANION GAP: 12 (ref 5–15)
AST: 38 U/L (ref 15–41)
Alkaline Phosphatase: 73 U/L (ref 38–126)
BILIRUBIN TOTAL: 1 mg/dL (ref 0.3–1.2)
BUN: 8 mg/dL (ref 6–20)
CHLORIDE: 86 mmol/L — AB (ref 101–111)
CO2: 23 mmol/L (ref 22–32)
Calcium: 8.4 mg/dL — ABNORMAL LOW (ref 8.9–10.3)
Creatinine, Ser: 0.54 mg/dL (ref 0.44–1.00)
GFR calc Af Amer: >60 mL/min (ref >60)
GFR calc non Af Amer: >60 mL/min (ref >60)
GLUCOSE: 104 mg/dL — AB (ref 65–99)
POTASSIUM: 3.7 mmol/L (ref 3.5–5.1)
SODIUM: 121 mmol/L — AB (ref 135–145)
TOTAL PROTEIN: 6.3 g/dL — AB (ref 6.5–8.1)

## 2015-01-15 LAB — I-STAT TROPONIN, ED: Troponin i, poc: 0.01 ng/mL (ref 0.00–0.08)

## 2015-01-15 LAB — URINE MICROSCOPIC-ADD ON

## 2015-01-15 MED ORDER — IOHEXOL 300 MG/ML  SOLN: 25.0000 mL | INTRAMUSCULAR | Status: AC

## 2015-01-15 MED ORDER — DEXTROSE 5 % IV SOLN
1.0000 g | Freq: Once | INTRAVENOUS | Status: AC
  Administered 2015-01-15: 1 g via INTRAVENOUS
  Filled 2015-01-15: qty 10

## 2015-01-15 MED ORDER — SODIUM CHLORIDE 0.9 % IV BOLUS (SEPSIS)
500.0000 mL | Freq: Once | INTRAVENOUS | Status: AC
  Administered 2015-01-15: 500 mL via INTRAVENOUS

## 2015-01-15 MED ORDER — CIPROFLOXACIN HCL 500 MG PO TABS: 500.0000 mg | ORAL_TABLET | Freq: Two times a day (BID) | ORAL | Status: DC

## 2015-01-15 MED ORDER — IOHEXOL 300 MG/ML  SOLN: 100.0000 mL | Freq: Once | INTRAMUSCULAR | Status: DC | PRN

## 2015-01-15 MED ORDER — MORPHINE SULFATE (PF) 2 MG/ML IV SOLN
2.0000 mg | Freq: Once | INTRAVENOUS | Status: AC
  Administered 2015-01-15: 2 mg via INTRAVENOUS
  Filled 2015-01-15: qty 1

## 2015-01-15 NOTE — Discharge Instructions (Signed)
You appear to have had a worsening compression fracture of your 12 thoracic vertebrae. You also appear to have a urinary tract infection. An additional antibiotics is being added. Your sodium is low at 121 which has been before. Please have this rechecked with your primary care doctor in the next 1-2 days.

## 2015-01-15 NOTE — ED Provider Notes (Addendum)
CSN: [id number]     Arrival date & time 01/15/15  1942 History   First MD Initiated Contact with Patient 01/15/15 1954     Chief Complaint  Patient presents with  . Weakness     (Consider location/radiation/quality/duration/timing/severity/associated sxs/prior Treatment) HPI 81 y.o. Female complaining of weakness and abdominal pain. She was seen here 3 days ago and intact diagnosed with a urinary tract infection. She has been taking Keflex. She states she continues to have symptoms. She feels like she needs to urinate and feels like she cannot empty her bladder. She denies fever or chills. I have checked the urine cultures and multiple species were present likely representing contaminated specimen. She states that today when she was walking she is not able to get up onto the side of her bed which was fairly high. Her daughter-in-law had to help her down to the floor she is not able to lift her up. She has been eating and drinking although not as well as usual. She has not had any vomiting. Past Medical History  Diagnosis Date  . Diverticulitis   . Hypertension   . Osteoporosis   . CAD (coronary artery disease)    Past Surgical History  Procedure Laterality Date  . Fracture surgery    . Partial colectomy    . Coronary artery bypass graft N/A 08/25/2012    Procedure: CORONARY ARTERY BYPASS GRAFTING ;  Surgeon: Alleen Borne, MD;  Location: [facility] OR;  Service: Open Heart Surgery;  Laterality: N/A;  four bypasses total   . Left heart catheterization with coronary angiogram N/A 08/22/2012    Procedure: LEFT HEART CATHETERIZATION WITH CORONARY ANGIOGRAM;  Surgeon: Peter M Swaziland, MD;  Location: [facility] CATH LAB;  Service: Cardiovascular;  Laterality: N/A;   Family History  Problem Relation Age of Onset  . Hypertension Other   . Cancer Mother     stomach  . Diabetes Neg Hx   . Heart disease Neg Hx   . Stroke Neg Hx    Social History  Substance Use Topics  . Smoking status: Former Smoker   Types: Cigarettes  . Smokeless tobacco: Never Used  . Alcohol Use: No     Comment: Occasional   OB History    No data available     Review of Systems  All other systems reviewed and are negative.     Allergies  Review of patient's allergies indicates no known allergies.  Home Medications   Prior to Admission medications   Medication Sig Start Date End Date Taking? Authorizing Provider  alendronate (FOSAMAX) 70 MG tablet TAKE 1 TABLET BY MOUTH EVERY 7 DAYS. TAKE WITH A FULL GLASS OF WATER ON AN EMPTY STOMACH 08/06/14   Sheliah Hatch, MD  amiodarone (PACERONE) 200 MG tablet TAKE 1 TABLET BY MOUTH DAILY. 01/02/15   Sheliah Hatch, MD  aspirin EC 325 MG tablet Take 325 mg by mouth every evening.     Historical Provider, MD  atorvastatin (LIPITOR) 40 MG tablet TAKE 1 TABLET BY MOUTH DAILY AT 6 PM 01/09/15   Sheliah Hatch, MD  cephALEXin (KEFLEX) 500 MG capsule Take 1 capsule (500 mg total) by mouth 2 (two) times daily. 01/12/15   Benjiman Core, MD  desloratadine (CLARINEX) 5 MG tablet Take 1 tablet (5 mg total) by mouth daily. 08/10/13   Gilda Crease, MD  diclofenac sodium (VOLTAREN) 1 % GEL Apply 4 g topically 4 (four) times daily. Patient not taking: Reported on 01/01/2015 08/20/14  Sheliah Hatch, MD  fluticasone (FLONASE) 50 MCG/ACT nasal spray Place 2 sprays into both nostrils daily. 10/05/13   Sheliah Hatch, MD  folic acid (FOLVITE) 1 MG tablet TAKE 1 TABLET (1 MG TOTAL) BY MOUTH DAILY. 09/24/14   Sheliah Hatch, MD  lisinopril (PRINIVIL,ZESTRIL) 40 MG tablet Take 1 tablet (40 mg total) by mouth daily. 09/11/14   Lewayne Bunting, MD  LUMIGAN 0.01 % SOLN PLACE 1 DROP INTO BOTH EYES AT BEDTIME. 08/29/14   Historical Provider, MD  memantine (NAMENDA) 10 MG tablet Take 1 tablet (10 mg total) by mouth 2 (two) times daily. 09/19/14   Drema Dallas, DO  metoprolol tartrate (LOPRESSOR) 25 MG tablet Take 1 tablet (25 mg total) by mouth 2 (two) times daily. 11/30/14    Sheliah Hatch, MD  Multiple Vitamin (MULTIVITAMIN) tablet Take 0.5 tablets by mouth 2 (two) times daily.     Historical Provider, MD  omeprazole (PRILOSEC) 20 MG capsule Take 1 capsule (20 mg total) by mouth daily. 01/12/15   Benjiman Core, MD  polyethylene glycol [facility] / GLYCOLAX) packet Take 17 g by mouth daily. 10/23/14   Jerald Kief, MD  traMADol (ULTRAM) 50 MG tablet Take 1 tablet (50 mg total) by mouth every 8 (eight) hours as needed for moderate pain. Patient not taking: Reported on 01/01/2015 10/23/14   Jerald Kief, MD   BP 191/77 mmHg  Pulse 54  Temp(Src) 98 F (36.7 C) (Oral)  Resp 14  SpO2 98% Physical Exam  Constitutional: She appears well-developed and well-nourished. No distress.  HENT:  Head: Normocephalic and atraumatic.  Nose: Nose normal.  Mouth/Throat: Oropharynx is clear and moist.  Eyes: Conjunctivae and EOM are normal. Pupils are equal, round, and reactive to light.  Neck: Normal range of motion.  Cardiovascular: Normal rate, normal heart sounds and intact distal pulses.   Pulmonary/Chest: Effort normal and breath sounds normal.  Abdominal: Soft. Bowel sounds are normal. She exhibits distension.  Musculoskeletal: Normal range of motion.  Neurological: She is alert.  Skin: Skin is warm and dry.  Psychiatric: She has a normal mood and affect.  Nursing note and vitals reviewed.   ED Course  Procedures (including critical care time) Labs Review Labs Reviewed  CBC WITH DIFFERENTIAL/PLATELET - Abnormal; Notable for the following:    RBC 3.77 (*)    MCH 35.0 (*)    Neutro Abs 8.4 (*)    All other components within normal limits  COMPREHENSIVE METABOLIC PANEL - Abnormal; Notable for the following:    Sodium 121 (*)    Chloride 86 (*)    Glucose, Bld 104 (*)    Calcium 8.4 (*)    Total Protein 6.3 (*)    Albumin 3.3 (*)    All other components within normal limits  URINALYSIS, ROUTINE W REFLEX MICROSCOPIC (NOT AT [facility]) - Abnormal; Notable  for the following:    APPearance CLOUDY (*)    Hgb urine dipstick SMALL (*)    Nitrite POSITIVE (*)    Leukocytes, UA LARGE (*)    All other components within normal limits  URINE MICROSCOPIC-ADD ON - Abnormal; Notable for the following:    Squamous Epithelial / LPF FEW (*)    Bacteria, UA MANY (*)    All other components within normal limits  LIPASE, BLOOD  I-STAT TROPOININ, ED    Imaging Review Dg Chest 1 View  01/15/2015   CLINICAL DATA:  Chest pain for 1 day.  EXAM: CHEST  1 VIEW  COMPARISON:  January 12, 2015, August 01, 2013  FINDINGS: The heart size and mediastinal contours are stable. Patient status post prior median sternotomy and CABG. Both lungs are clear. The visualized skeletal structures are stable.  IMPRESSION: No active cardiopulmonary disease.   Electronically Signed   By: Sherian Rein M.D.   On: 01/15/2015 21:49   I have personally reviewed and evaluated these images and lab results as part of my medical decision-making.   EKG Interpretation   Date/Time:  Tuesday January 15 2015 21:58:32 EDT Ventricular Rate:  53 PR Interval:  128 QRS Duration: 140 QT Interval:  496 QTC Calculation: 466 R Axis:   3 Text Interpretation:  Sinus rhythm Probable left atrial enlargement Right  bundle branch block No significant change since last tracing Confirmed by  Annelle Behrendt MD, Duwayne Heck ([id number]) on 01/15/2015 10:13:57 PM      MDM   Final diagnoses:  Compression fracture of T12 vertebra, sequela  UTI (lower urinary tract infection)   1- weakness- likely secondary to hyponatremia 2-uti/urinary retuention treated with rocephin.  Culture sent. 3- t12 compression fractures   Margarita Grizzle, MD 01/15/15 2325  Margarita Grizzle, MD 01/15/15 2345  Discussed with Dr. Clyde Lundborg and will place temporary admit orders for telemetry.  Margarita Grizzle, MD 01/16/15 [phone]

## 2015-01-15 NOTE — ED Notes (Signed)
Phlebotomy at bedside.

## 2015-01-15 NOTE — ED Notes (Signed)
Dr. Rosalia Hammers at bedside with patient/family.

## 2015-01-15 NOTE — ED Notes (Signed)
MD at bedside. 

## 2015-01-15 NOTE — Telephone Encounter (Signed)
Ok for cataract surgery Jacqueline [name]  

## 2015-01-15 NOTE — ED Notes (Signed)
Patient arrived via [facility]. EMS reports: Patient lives at home with son/daughter-in-law. Seen in ED on Saturday for UTI. Prescribed antibiotic. Family reports patient has become progressively weaker. Unable to transfer her today. Patient c/o generalized pain & has chronic back pain. Also, c/o L upper abdominal pain. BP 220/110, Pulse 60, Resp 20, CBG 113.

## 2015-01-16 ENCOUNTER — Encounter (HOSPITAL_COMMUNITY): Payer: Self-pay | Admitting: Internal Medicine

## 2015-01-16 DIAGNOSIS — M4854XA Collapsed vertebra, not elsewhere classified, thoracic region, initial encounter for fracture: Secondary | ICD-10-CM | POA: Diagnosis present

## 2015-01-16 DIAGNOSIS — M810 Age-related osteoporosis without current pathological fracture: Secondary | ICD-10-CM | POA: Diagnosis present

## 2015-01-16 DIAGNOSIS — R011 Cardiac murmur, unspecified: Secondary | ICD-10-CM

## 2015-01-16 DIAGNOSIS — E861 Hypovolemia: Secondary | ICD-10-CM | POA: Diagnosis present

## 2015-01-16 DIAGNOSIS — R001 Bradycardia, unspecified: Secondary | ICD-10-CM | POA: Diagnosis present

## 2015-01-16 DIAGNOSIS — Z87891 Personal history of nicotine dependence: Secondary | ICD-10-CM | POA: Diagnosis not present

## 2015-01-16 DIAGNOSIS — Z7982 Long term (current) use of aspirin: Secondary | ICD-10-CM | POA: Diagnosis not present

## 2015-01-16 DIAGNOSIS — E876 Hypokalemia: Secondary | ICD-10-CM | POA: Diagnosis present

## 2015-01-16 DIAGNOSIS — I480 Paroxysmal atrial fibrillation: Secondary | ICD-10-CM

## 2015-01-16 DIAGNOSIS — I350 Nonrheumatic aortic (valve) stenosis: Secondary | ICD-10-CM | POA: Diagnosis present

## 2015-01-16 DIAGNOSIS — R630 Anorexia: Secondary | ICD-10-CM | POA: Diagnosis present

## 2015-01-16 DIAGNOSIS — I10 Essential (primary) hypertension: Secondary | ICD-10-CM | POA: Diagnosis present

## 2015-01-16 DIAGNOSIS — Z800 Family history of malignant neoplasm of digestive organs: Secondary | ICD-10-CM | POA: Diagnosis not present

## 2015-01-16 DIAGNOSIS — R531 Weakness: Secondary | ICD-10-CM

## 2015-01-16 DIAGNOSIS — N390 Urinary tract infection, site not specified: Secondary | ICD-10-CM | POA: Diagnosis present

## 2015-01-16 DIAGNOSIS — E785 Hyperlipidemia, unspecified: Secondary | ICD-10-CM | POA: Diagnosis present

## 2015-01-16 DIAGNOSIS — I2510 Atherosclerotic heart disease of native coronary artery without angina pectoris: Secondary | ICD-10-CM | POA: Diagnosis present

## 2015-01-16 DIAGNOSIS — K5900 Constipation, unspecified: Secondary | ICD-10-CM | POA: Diagnosis present

## 2015-01-16 DIAGNOSIS — Z951 Presence of aortocoronary bypass graft: Secondary | ICD-10-CM | POA: Diagnosis not present

## 2015-01-16 DIAGNOSIS — Z681 Body mass index (BMI) 19 or less, adult: Secondary | ICD-10-CM | POA: Diagnosis not present

## 2015-01-16 DIAGNOSIS — S22080A Wedge compression fracture of T11-T12 vertebra, initial encounter for closed fracture: Secondary | ICD-10-CM | POA: Diagnosis present

## 2015-01-16 DIAGNOSIS — E871 Hypo-osmolality and hyponatremia: Secondary | ICD-10-CM | POA: Diagnosis present

## 2015-01-16 DIAGNOSIS — M4854XS Collapsed vertebra, not elsewhere classified, thoracic region, sequela of fracture: Secondary | ICD-10-CM | POA: Diagnosis not present

## 2015-01-16 DIAGNOSIS — F0390 Unspecified dementia without behavioral disturbance: Secondary | ICD-10-CM | POA: Diagnosis present

## 2015-01-16 DIAGNOSIS — Z7983 Long term (current) use of bisphosphonates: Secondary | ICD-10-CM | POA: Diagnosis not present

## 2015-01-16 DIAGNOSIS — Z79899 Other long term (current) drug therapy: Secondary | ICD-10-CM | POA: Diagnosis not present

## 2015-01-16 DIAGNOSIS — Z9181 History of falling: Secondary | ICD-10-CM | POA: Diagnosis not present

## 2015-01-16 DIAGNOSIS — I4891 Unspecified atrial fibrillation: Secondary | ICD-10-CM | POA: Diagnosis not present

## 2015-01-16 LAB — TROPONIN I

## 2015-01-16 LAB — BASIC METABOLIC PANEL
ANION GAP: 11 (ref 5–15)
Anion gap: 10 (ref 5–15)
BUN: 6 mg/dL (ref 6–20)
BUN: 7 mg/dL (ref 6–20)
CALCIUM: 8.5 mg/dL — AB (ref 8.9–10.3)
CO2: 28 mmol/L (ref 22–32)
CO2: 21 mmol/L — ABNORMAL LOW (ref 22–32)
CREATININE: 0.63 mg/dL (ref 0.44–1.00)
Calcium: 8.1 mg/dL — ABNORMAL LOW (ref 8.9–10.3)
Chloride: 87 mmol/L — ABNORMAL LOW (ref 101–111)
Chloride: 94 mmol/L — ABNORMAL LOW (ref 101–111)
Creatinine, Ser: 0.56 mg/dL (ref 0.44–1.00)
GFR calc Af Amer: >60 mL/min (ref >60)
Glucose, Bld: 139 mg/dL — ABNORMAL HIGH (ref 65–99)
Glucose, Bld: 149 mg/dL — ABNORMAL HIGH (ref 65–99)
Potassium: 3.1 mmol/L — ABNORMAL LOW (ref 3.5–5.1)
Potassium: 4 mmol/L (ref 3.5–5.1)
SODIUM: 125 mmol/L — AB (ref 135–145)
Sodium: 126 mmol/L — ABNORMAL LOW (ref 135–145)

## 2015-01-16 LAB — CBC
HCT: 39.3 % (ref 36.0–46.0)
HEMOGLOBIN: 13.3 g/dL (ref 12.0–15.0)
MCH: 33.8 pg (ref 26.0–34.0)
MCHC: 33.8 g/dL (ref 30.0–36.0)
MCV: 100 fL (ref 78.0–100.0)
Platelets: 224 10*3/uL (ref 150–400)
RBC: 3.93 MIL/uL (ref 3.87–5.11)
RDW: 14.2 % (ref 11.5–15.5)
WBC: 9.1 10*3/uL (ref 4.0–10.5)

## 2015-01-16 LAB — SODIUM, URINE, RANDOM: Sodium, Ur: 30 mmol/L

## 2015-01-16 LAB — OSMOLALITY, URINE: Osmolality, Ur: 552 mOsm/kg (ref 390–1090)

## 2015-01-16 LAB — PROTIME-INR
INR: 1.12 (ref 0.00–1.49)
Prothrombin Time: 14.6 seconds (ref 11.6–15.2)

## 2015-01-16 LAB — PROCALCITONIN: Procalcitonin: <0.1 ng/mL

## 2015-01-16 LAB — TSH: TSH: 2.323 u[IU]/mL (ref 0.350–4.500)

## 2015-01-16 LAB — LIPASE, BLOOD: LIPASE: 35 U/L (ref 22–51)

## 2015-01-16 LAB — APTT: APTT: 26 s (ref 24–37)

## 2015-01-16 LAB — LACTIC ACID, PLASMA
LACTIC ACID, VENOUS: 1.1 mmol/L (ref 0.5–2.0)
Lactic Acid, Venous: 1.5 mmol/L (ref 0.5–2.0)

## 2015-01-16 MED ORDER — AMIODARONE HCL 200 MG PO TABS
200.0000 mg | ORAL_TABLET | Freq: Every day | ORAL | Status: DC
  Administered 2015-01-16 – 2015-01-18 (×3): 200 mg via ORAL
  Filled 2015-01-16 (×3): qty 1

## 2015-01-16 MED ORDER — HYDRALAZINE HCL 20 MG/ML IJ SOLN
5.0000 mg | INTRAMUSCULAR | Status: DC | PRN
  Administered 2015-01-18: 5 mg via INTRAVENOUS
  Filled 2015-01-16: qty 1

## 2015-01-16 MED ORDER — POLYETHYLENE GLYCOL 3350 17 G PO PACK: 17.0000 g | PACK | Freq: Every day | ORAL | Status: DC | PRN

## 2015-01-16 MED ORDER — LORATADINE 10 MG PO TABS: 10.0000 mg | ORAL_TABLET | Freq: Every day | ORAL | Status: DC | PRN

## 2015-01-16 MED ORDER — LISINOPRIL 40 MG PO TABS
40.0000 mg | ORAL_TABLET | Freq: Every day | ORAL | Status: DC
  Administered 2015-01-16 – 2015-01-18 (×3): 40 mg via ORAL
  Filled 2015-01-16 (×3): qty 1

## 2015-01-16 MED ORDER — MEMANTINE HCL 10 MG PO TABS
10.0000 mg | ORAL_TABLET | Freq: Two times a day (BID) | ORAL | Status: DC
  Administered 2015-01-16 – 2015-01-18 (×5): 10 mg via ORAL
  Filled 2015-01-16 (×5): qty 1

## 2015-01-16 MED ORDER — ENSURE ENLIVE PO LIQD
237.0000 mL | Freq: Three times a day (TID) | ORAL | Status: DC
  Administered 2015-01-16 – 2015-01-18 (×5): 237 mL via ORAL

## 2015-01-16 MED ORDER — METOPROLOL TARTRATE 25 MG PO TABS
25.0000 mg | ORAL_TABLET | Freq: Two times a day (BID) | ORAL | Status: DC
  Administered 2015-01-16 – 2015-01-18 (×5): 25 mg via ORAL
  Filled 2015-01-16 (×6): qty 1

## 2015-01-16 MED ORDER — FLUTICASONE PROPIONATE 50 MCG/ACT NA SUSP
2.0000 | Freq: Every day | NASAL | Status: DC
  Administered 2015-01-17: 2 via NASAL
  Filled 2015-01-16: qty 16

## 2015-01-16 MED ORDER — SODIUM CHLORIDE 0.9 % IJ SOLN
3.0000 mL | Freq: Two times a day (BID) | INTRAMUSCULAR | Status: DC
  Administered 2015-01-16 – 2015-01-18 (×3): 3 mL via INTRAVENOUS

## 2015-01-16 MED ORDER — ACETAMINOPHEN 650 MG RE SUPP: 650.0000 mg | Freq: Four times a day (QID) | RECTAL | Status: DC | PRN

## 2015-01-16 MED ORDER — ACETAMINOPHEN 325 MG PO TABS: 650.0000 mg | ORAL_TABLET | Freq: Four times a day (QID) | ORAL | Status: DC | PRN

## 2015-01-16 MED ORDER — ZOLPIDEM TARTRATE 5 MG PO TABS
5.0000 mg | ORAL_TABLET | Freq: Once | ORAL | Status: AC
  Administered 2015-01-16: 5 mg via ORAL
  Filled 2015-01-16 (×2): qty 1

## 2015-01-16 MED ORDER — ONDANSETRON HCL 4 MG PO TABS: 4.0000 mg | ORAL_TABLET | Freq: Four times a day (QID) | ORAL | Status: DC | PRN

## 2015-01-16 MED ORDER — HEPARIN SODIUM (PORCINE) 5000 UNIT/ML IJ SOLN
5000.0000 [IU] | Freq: Three times a day (TID) | INTRAMUSCULAR | Status: DC
  Administered 2015-01-16 – 2015-01-18 (×6): 5000 [IU] via SUBCUTANEOUS
  Filled 2015-01-16 (×5): qty 1

## 2015-01-16 MED ORDER — FOLIC ACID 1 MG PO TABS
1.0000 mg | ORAL_TABLET | Freq: Every day | ORAL | Status: DC
  Administered 2015-01-16 – 2015-01-18 (×3): 1 mg via ORAL
  Filled 2015-01-16 (×3): qty 1

## 2015-01-16 MED ORDER — DEXTROSE 5 % IV SOLN
1.0000 g | INTRAVENOUS | Status: DC
  Administered 2015-01-16 – 2015-01-18 (×2): 1 g via INTRAVENOUS
  Filled 2015-01-16 (×2): qty 10

## 2015-01-16 MED ORDER — ASPIRIN EC 325 MG PO TBEC
325.0000 mg | DELAYED_RELEASE_TABLET | Freq: Every evening | ORAL | Status: DC
  Administered 2015-01-16 – 2015-01-17 (×2): 325 mg via ORAL
  Filled 2015-01-16 (×2): qty 1

## 2015-01-16 MED ORDER — POTASSIUM CHLORIDE CRYS ER 20 MEQ PO TBCR
40.0000 meq | EXTENDED_RELEASE_TABLET | Freq: Once | ORAL | Status: AC
  Administered 2015-01-16: 40 meq via ORAL
  Filled 2015-01-16: qty 2

## 2015-01-16 MED ORDER — SODIUM CHLORIDE 0.9 % IV SOLN
INTRAVENOUS | Status: DC
  Administered 2015-01-16 (×2): via INTRAVENOUS

## 2015-01-16 MED ORDER — ADULT MULTIVITAMIN W/MINERALS CH
0.5000 | ORAL_TABLET | Freq: Two times a day (BID) | ORAL | Status: DC
  Administered 2015-01-16 – 2015-01-18 (×5): 0.5 via ORAL
  Filled 2015-01-16 (×5): qty 1

## 2015-01-16 MED ORDER — PANTOPRAZOLE SODIUM 40 MG PO TBEC
40.0000 mg | DELAYED_RELEASE_TABLET | Freq: Every day | ORAL | Status: DC
  Administered 2015-01-16 – 2015-01-18 (×3): 40 mg via ORAL
  Filled 2015-01-16 (×3): qty 1

## 2015-01-16 MED ORDER — ONDANSETRON HCL 4 MG/2ML IJ SOLN
4.0000 mg | Freq: Four times a day (QID) | INTRAMUSCULAR | Status: DC | PRN
  Filled 2015-01-16: qty 2

## 2015-01-16 MED ORDER — ATORVASTATIN CALCIUM 40 MG PO TABS
40.0000 mg | ORAL_TABLET | Freq: Every day | ORAL | Status: DC
  Administered 2015-01-16 – 2015-01-17 (×2): 40 mg via ORAL
  Filled 2015-01-16 (×2): qty 1

## 2015-01-16 MED ORDER — ONE-DAILY MULTI VITAMINS PO TABS: 0.5000 | ORAL_TABLET | Freq: Two times a day (BID) | ORAL | Status: DC

## 2015-01-16 MED ORDER — TRAMADOL HCL 50 MG PO TABS
50.0000 mg | ORAL_TABLET | Freq: Three times a day (TID) | ORAL | Status: DC | PRN
  Administered 2015-01-16 – 2015-01-18 (×4): 50 mg via ORAL
  Filled 2015-01-16 (×4): qty 1

## 2015-01-16 MED ORDER — LATANOPROST 0.005 % OP SOLN
1.0000 [drp] | Freq: Every day | OPHTHALMIC | Status: DC
  Administered 2015-01-16 – 2015-01-17 (×2): 1 [drp] via OPHTHALMIC
  Filled 2015-01-16: qty 2.5

## 2015-01-16 NOTE — ED Notes (Signed)
Hospitalist at bedside 

## 2015-01-16 NOTE — Care Management Note (Addendum)
Case Management Note  Patient Details  Name: Jacqueline [name] MRN: [id number] Date of Birth: September 15, 1928  Subjective/Objective:      Date: 01/16/15 Spoke with patient at the bedside . Introduced self as Sports coach and explained role in discharge planning and how to be reached. Verified patient lives in town, alone with son and daughter n law, son works and daughter law goes to school, has DME rolling walker and shower stool . Expressed no  potential need for no other DME. Verified patient anticipates to go home with family,  at time of discharge and will have part-time supervision by family  at this time to best of their knowledge, she states if she has to go to a facility she has been at [facility] before which would be ok for her . Patient  denied needing help with their medication. Patient  is driven by her family to MD appointments. Verified patient has PCP [facility].   NCM also spoke with son and confirmed this information.   Plan: CM will continue to follow for discharge planning and [facility] resources.               Action/Plan:   Expected Discharge Date:                  Expected Discharge Plan:  Home w Home Health Services  In-House Referral:     Discharge planning Services  CM Consult  Post Acute Care Choice:    Choice offered to:     DME Arranged:    DME Agency:     HH Arranged:    HH Agency:     Status of Service:  In process, will continue to follow  Medicare Important Message Given:    Date Medicare IM Given:    Medicare IM give by:    Date Additional Medicare IM Given:    Additional Medicare Important Message give by:     If discussed at Long Length of Stay Meetings, dates discussed:    Additional Comments:  Leone Haven, RN 01/16/2015, 12:43 PM

## 2015-01-16 NOTE — Telephone Encounter (Signed)
Will forward this note

## 2015-01-16 NOTE — H&P (Signed)
[facility] History and Physical  Jacqueline [name] ZOX:[id number] DOB: 07-04-1928 DOA: 01/15/2015  Referring physician: ED physician PCP: [name], MD  Specialists:   Chief Complaint: Generalized weakness, and increased urinary frequency  HPI: Jacqueline [name] is a 81 y.o. female with PMH of dementia, coronary artery disease status post CABG 5, remote paroxysmal A. fib followed by cardiology on no anticoagulation, hypertension, hyperlipidemia, GERD, osteoporosis, diverticulitis, compression fracture T12, who presents with a generalized weakness and increased urinary frequency.  Patient has dementia and is unable to provide accurate medical history, therefore, most of the history is obtained by discussing the case with EDP and with her daughter in-law. It seems that patient was diagnosed as UTI on Saturday, and given prescription of Keflex. Patient has been taking this medication, but she still has increased urinary frequency. She does not have dysuria or burning. She has generalized weakness which has been progressively getting worse. She reports having mild pain over epigastric area, no nausea, vomiting. Per her daughter-in-law, patient has alternative constipation and diarrhea, currently she is in constipation. Patient does not have chest pain, shortness of breath, cough, rashes, unilateral weakness.   In ED, patient was found to have positive urinalysis with a large amount of leukocytes and positive nitrite, negative troponin, WBC 10.3, temperature normal, bradycardia, elevated blood pressure at 212/88, sodium 121, renal function okay, negative chest x-ray. 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 This is causing mild to moderate spinal stenosis. Patient is admitted to inpatient for further evaluation and  treatment.  Where does patient live?   At home  Can patient participate in ADLs?  Yes     Review of Systems: Cannot be reviewed due to dementia.  Allergy: No Known Allergies  Past Medical History  Diagnosis Date  . Diverticulitis   . Hypertension   . Osteoporosis   . CAD (coronary artery disease)   . Dementia   . PAF (paroxysmal atrial fibrillation) (HCC)   . HLD (hyperlipidemia)   . GERD (gastroesophageal reflux disease)   . Osteoporosis     Past Surgical History  Procedure Laterality Date  . Fracture surgery    . Partial colectomy    . Coronary artery bypass graft N/A 08/25/2012    Procedure: CORONARY ARTERY BYPASS GRAFTING ;  Surgeon: Alleen Borne, MD;  Location: [facility] OR;  Service: Open Heart Surgery;  Laterality: N/A;  four bypasses total   . Left heart catheterization with coronary angiogram N/A 08/22/2012    Procedure: LEFT HEART CATHETERIZATION WITH CORONARY ANGIOGRAM;  Surgeon: Peter M Swaziland, MD;  Location: [facility] CATH LAB;  Service: Cardiovascular;  Laterality: N/A;    Social History:  reports that she has quit smoking. Her smoking use included Cigarettes. She has never used smokeless tobacco. She reports that she does not drink alcohol or use illicit drugs.  Family History:  Family History  Problem Relation Age of Onset  . Hypertension Other   . Cancer Mother     stomach  . Diabetes Neg Hx   . Heart disease Neg Hx   . Stroke Neg Hx      Prior to Admission medications   Medication Sig Start Date End Date Taking? Authorizing Provider  alendronate (FOSAMAX) 70 MG tablet TAKE 1 TABLET BY MOUTH EVERY 7 DAYS. TAKE WITH A FULL GLASS OF WATER ON AN EMPTY STOMACH  08/06/14   Sheliah Hatch, MD  amiodarone (PACERONE) 200 MG tablet TAKE 1 TABLET BY MOUTH DAILY. 01/02/15   Sheliah Hatch, MD  aspirin EC 325 MG tablet Take 325 mg by mouth every evening.     Historical Provider, MD  atorvastatin (LIPITOR) 40 MG tablet TAKE 1 TABLET BY MOUTH DAILY AT 6 PM 01/09/15    Sheliah Hatch, MD  cephALEXin (KEFLEX) 500 MG capsule Take 1 capsule (500 mg total) by mouth 2 (two) times daily. 01/12/15   Benjiman Core, MD  ciprofloxacin (CIPRO) 500 MG tablet Take 1 tablet (500 mg total) by mouth every 12 (twelve) hours. 01/15/15   Margarita Grizzle, MD  desloratadine (CLARINEX) 5 MG tablet Take 1 tablet (5 mg total) by mouth daily. 08/10/13   Gilda Crease, MD  diclofenac sodium (VOLTAREN) 1 % GEL Apply 4 g topically 4 (four) times daily. Patient not taking: Reported on 01/01/2015 08/20/14   Sheliah Hatch, MD  fluticasone [facility]) 50 MCG/ACT nasal spray Place 2 sprays into both nostrils daily. 10/05/13   Sheliah Hatch, MD  folic acid (FOLVITE) 1 MG tablet TAKE 1 TABLET (1 MG TOTAL) BY MOUTH DAILY. 09/24/14   Sheliah Hatch, MD  lisinopril (PRINIVIL,ZESTRIL) 40 MG tablet Take 1 tablet (40 mg total) by mouth daily. 09/11/14   Lewayne Bunting, MD  LUMIGAN 0.01 % SOLN PLACE 1 DROP INTO BOTH EYES AT BEDTIME. 08/29/14   Historical Provider, MD  memantine (NAMENDA) 10 MG tablet Take 1 tablet (10 mg total) by mouth 2 (two) times daily. 09/19/14   Drema Dallas, DO  metoprolol tartrate (LOPRESSOR) 25 MG tablet Take 1 tablet (25 mg total) by mouth 2 (two) times daily. 11/30/14   Sheliah Hatch, MD  Multiple Vitamin (MULTIVITAMIN) tablet Take 0.5 tablets by mouth 2 (two) times daily.     Historical Provider, MD  omeprazole (PRILOSEC) 20 MG capsule Take 1 capsule (20 mg total) by mouth daily. 01/12/15   Benjiman Core, MD  polyethylene glycol [facility] / GLYCOLAX) packet Take 17 g by mouth daily. 10/23/14   Jerald Kief, MD  traMADol (ULTRAM) 50 MG tablet Take 1 tablet (50 mg total) by mouth every 8 (eight) hours as needed for moderate pain. Patient not taking: Reported on 01/01/2015 10/23/14   Jerald Kief, MD    Physical Exam: Filed Vitals:   01/16/15 0045 01/16/15 0100 01/16/15 0115 01/16/15 0144  BP: 178/81 195/82 169/82 159/77  Pulse: 52 54 55 53  Temp:     98.1 F (36.7 C)  TempSrc:    Oral  Resp: SpO2: 96% 98% 97%    General: Not in acute distress HEENT:       Eyes: PERRL, EOMI, no scleral icterus.       ENT: No discharge from the ears and nose, no pharynx injection, no tonsillar enlargement.        Neck: No JVD, no bruit, no mass felt. Heme: No neck lymph node enlargement. Cardiac: S1/S2, RRR, 3/6 systolic murmurs, No gallops or rubs. Pulm: No rales, wheezing, rhonchi or rubs. Abd: Soft, nondistended, nontender, no rebound pain, no organomegaly, BS present. Ext: No pitting leg edema bilaterally. 2+DP/PT pulse bilaterally. Musculoskeletal: No joint deformities, No joint redness or warmth, no limitation of ROM in spin. Skin: No rashes.  Neuro: Alert, not oriented X3, cranial nerves II-XII grossly intact, moves all extremities.  Psych: Could not be reviewed due to dementia.  Labs on  Admission:  Basic Metabolic Panel:  Recent Labs Lab 01/12/15 0750 01/15/15 2106 01/16/15 0245  NA 135 121* 126*  K 4.1 3.7 3.1*  CL 100* 86* 87*  CO2 26 23 28   GLUCOSE 120* 104* 139*  BUN 13 8 6   CREATININE 0.73 0.54 0.56  CALCIUM 10.0 8.4* 8.5*   Liver Function Tests:  Recent Labs Lab 01/12/15 0750 01/15/15 2106  AST 37 38  ALT 33 32  ALKPHOS 79 73  BILITOT 1.1 1.0  PROT 7.2 6.3*  ALBUMIN 3.6 3.3*    Recent Labs Lab 01/12/15 0750 01/16/15 0245  LIPASE 38 35   No results for input(s): AMMONIA in the last 168 hours. CBC:  Recent Labs Lab 01/12/15 0750 01/15/15 2106 01/16/15 0245  WBC 8.3 10.3 9.1  NEUTROABS  --  8.4*  --   HGB 13.7 13.2 13.3  HCT 42.0 37.1 39.3  MCV 104.2* 98.4 100.0  PLT 231 175 224   Cardiac Enzymes: No results for input(s): CKTOTAL, CKMB, CKMBINDEX, TROPONINI in the last 168 hours.  BNP (last 3 results)  Recent Labs  07/19/14 1818  BNP 191.0*    ProBNP (last 3 results) No results for input(s): PROBNP in the last 8760 hours.  CBG: No results for input(s): GLUCAP in the last  168 hours.  Radiological Exams on Admission: Dg Chest 1 View  01/15/2015   CLINICAL DATA:  Chest pain for 1 day.  EXAM: CHEST 1 VIEW  COMPARISON:  January 12, 2015, August 01, 2013  FINDINGS: The heart size and mediastinal contours are stable. Patient status post prior median sternotomy and CABG. Both lungs are clear. The visualized skeletal structures are stable.  IMPRESSION: No active cardiopulmonary disease.   Electronically Signed   By: Sherian Rein M.D.   On: 01/15/2015 21:49    EKG: Independently reviewed.  Abnormal findings: QTC 466, old right bundle blockage  Assessment/Plan Principal Problem:   UTI (lower urinary tract infection) Active Problems:   HTN (hypertension)   Systolic murmur   Pancreatic lesion   CAD (coronary artery disease)   Atrial fibrillation (HCC)   Aortic stenosis   Hyperlipidemia   Hyponatremia   Generalized weakness   Compression fracture of T12 vertebra (HCC)  UTI (lower urinary tract infection): Patient has positive urinalysis with large amount of leukocytes and positive nitrite. Patient is not septic on admission. Hemodynamically stable.   - Admit to telemetry - Ceftriaxone IV - Follow up results of urine and blood cx and amend antibiotic regimen if needed per sensitivity results - will get Procalcitonin and trend lactic acid levels -  IVF: 500cc of NS bolus in ED, followed by 100 cc/h  PAF: CHA2DS2-VASc Score is 4, needs oral anticoagulation. Followed by cardiology. Patient is not AC at home due to frail body habitus and history of dementia. Heart rate is well controlled. -continue metoprolol and amiodarone  HTN (hypertension): Blood pressure is elevated at 212/88 due to missing blood pressure medications today. -Lisinopril, metoprolol -IV hydralazine.  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 Patient reports  having pain over epigastric area. Initial troponin is negative. -Troponin 3 -Continue aspirin, metoprolol, Lipitor  Epigastric pain: Etiology is not clear. Likely due to acid reflux. -will check lipase -protonix  GERD: -Protonix  HLD: Last LDL was 47 on 02/05/49 -Continue home medications: Lipitor  Hyponatremia: Sodium 121. Likely due  to decreased oral intake. No suspicious medications on the list. -IV fluid as above -Check plasma and urine osmolality, and urine sodium level -check TSH -Follow-up BMP in the morning  Generalized weakness: Likely due to multiple factorials, including deconditioning, UTI -pt/ot -Treat underlying problems  Compression fracture of T12 vertebra [facility]): No alarming symptoms, such as urinary incontinence, or weakness in extremities. -pain control: When necessary Tylenol, tramadol - may give referral to ortho at discharge  Dementia: -Namenda  DVT ppx: SQ Heparin      Code Status: DNR Family Communication: Yes, patient's daughter in-law at bed side Disposition Plan: Admit to inpatient   Date of Service 01/16/2015    Lorretta Harp [facility] Pager [phone]  If 7PM-7AM, please contact night-coverage [email or web address] Password [email or web address] 01/16/2015, 4:00 AM

## 2015-01-16 NOTE — ED Notes (Signed)
Daughter-in-law, Marylene Land, can be reached at [phone] (cell).

## 2015-01-16 NOTE — Progress Notes (Signed)
[facility] Progress Note   Jacqueline [name]  ZOX:[id number]  DOB: [birth year]  DOA: 01/15/2015 PCP: [name], MD  Brief narrative: Jacqueline [name] is a 81 y.o. female with past medical history dementia, coronary artery disease status post CABG 5, paroxysmal atrial fibrillation not on anticoagulation, hypertension hyperlipidemia who presents to the hospital for generalized weakness and increased thirst. He was diagnosed with a UTI this past Saturday and started on Keflex but continued to have increased frequency of urination. She also complained of some epigastric pain. In the ER she was found to have a UTI, hypertension with a BP of 212/88, sodium of 121  Subjective: Patient complains of "feeling bad". No nausea, vomiting, abdominal pain. No dysuria or hematuria.  Assessment/Plan: Principal Problem:   Hyponatremia -has had a prior episode of this which corrected with IV fluids -She admits to poor by mouth intake and therefore the suspicion is that it is likely due to hypovolemia -Urine studies were ordered on admission and done late this afternoon after the patient had received IV fluids-urine sodium is 30 urine osmolality 552-I would recommend continuing IV fluids and following sodium levels closely -Encourage by mouth intake  Active Problems: UTI -Was being treated with Keflex at outpatient and currently on Rocephin-follow-up culture  Anorexia -Start dietary supplements  Hypokalemia -Replace and recheck tomorrow  Paroxysmal A. 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    CAD (coronary artery disease) status post CABG -Continue aspirin, metoprolol, Lipitor  Mild aortic stenosis  Hyperlipidemia -Continue  Lipitor  Dementia -Continue Namenda   Code Status:     Code Status Orders        Start     Ordered   01/16/15 0045  Do not attempt resuscitation (DNR)   Continuous    Question Answer Comment  In the event of cardiac or respiratory ARREST Do not call a "code blue"   In the event of cardiac or respiratory ARREST Do not perform Intubation, CPR, defibrillation or ACLS   In the event of cardiac or respiratory ARREST Use medication by any route, position, wound care, and other measures to relive pain and suffering. May use oxygen, suction and manual treatment of airway obstruction as needed for comfort.      01/16/15 0046    Advance Directive Documentation        Most Recent Value   Type of Advance Directive  Living will   Pre-existing out of facility DNR order (yellow form or pink MOST form)     "MOST" Form in Place?       Family Communication:  Disposition Plan: Follow sodium levels, treated UTI, PT eval DVT prophylaxis: Heparin Consultants: Procedures:  Antibiotics: Anti-infectives    Start     Dose/Rate Route Frequency Ordered Stop   01/16/15 0045  cefTRIAXone (ROCEPHIN) 1 g in dextrose 5 % 50 mL IVPB     1 g 100 mL/hr over 30 Minutes Intravenous Every 24 hours 01/16/15 0043     01/15/15 2315  cefTRIAXone (ROCEPHIN) 1 g in dextrose 5 % 50 mL IVPB     1 g 100 mL/hr over 30 Minutes Intravenous  Once 01/15/15 2301 01/16/15 0005   01/15/15 0000  ciprofloxacin (CIPRO) 500 MG tablet     500 mg Oral Every 12 hours 01/15/15 2326  Objective: Filed Weights   01/16/15 0144  Weight: 50.8 kg (111 lb 15.9 oz)    Intake/Output Summary (Last 24 hours) at 01/16/15 1652 Last data filed at 01/16/15 1154  Gross per 24 hour  Intake 1455.92 ml  Output   4020 ml  Net -2564.08 ml     Vitals Filed Vitals:   01/16/15 0144 01/16/15 0559 01/16/15 0935 01/16/15 1339  BP: 159/77 153/68 153/68 187/75  Pulse: 53 58 61 56  Temp: 98.1 F (36.7 C) 99.3 F (37.4 C)  97.9 F  (36.6 C)  TempSrc: Oral Oral  Oral  Resp: Height:  (1.676 m)     Weight: 50.8 kg (111 lb 15.9 oz)     SpO2:  99%  98%    Exam:  General:  Pt is alert, not in acute distress  HEENT: No icterus, No thrush, oral mucosa moist  Cardiovascular: regular rate and rhythm, S1/S2 No murmur  Respiratory: clear to auscultation bilaterally   Abdomen: Soft, +Bowel sounds, non tender, non distended, no guarding  MSK: No LE edema, cyanosis or clubbing  Data Reviewed: Basic Metabolic Panel:  Recent Labs Lab 01/12/15 0750 01/15/15 2106 01/16/15 0245  NA 135 121* 126*  K 4.1 3.7 3.1*  CL 100* 86* 87*  CO2 GLUCOSE 120* 104* 139*  BUN CREATININE 0.73 0.54 0.56  CALCIUM 10.0 8.4* 8.5*   Liver Function Tests:  Recent Labs Lab 01/12/15 0750 01/15/15 2106  AST 37 38  ALT 33 32  ALKPHOS 79 73  BILITOT 1.1 1.0  PROT 7.2 6.3*  ALBUMIN 3.6 3.3*    Recent Labs Lab 01/12/15 0750 01/16/15 0245  LIPASE 38 35   No results for input(s): AMMONIA in the last 168 hours. CBC:  Recent Labs Lab 01/12/15 0750 01/15/15 2106 01/16/15 0245  WBC 8.3 10.3 9.1  NEUTROABS  --  8.4*  --   HGB 13.7 13.2 13.3  HCT 42.0 37.1 39.3  MCV 104.2* 98.4 100.0  PLT 231 175 224   Cardiac Enzymes:  Recent Labs Lab 01/16/15 0245 01/16/15 0947 01/16/15 1540  TROPONINI <0.03 <0.03 <0.03   BNP (last 3 results)  Recent Labs  07/19/14 1818  BNP 191.0*    ProBNP (last 3 results) No results for input(s): PROBNP in the last 8760 hours.  CBG: No results for input(s): GLUCAP in the last 168 hours.  Recent Results (from the past 240 hour(s))  Urine culture     Status: None   Collection Time: 01/12/15  9:55 AM  Result Value Ref Range Status   Specimen Description URINE, RANDOM  Final   Special Requests NONE  Final   Culture MULTIPLE SPECIES PRESENT, SUGGEST RECOLLECTION  Final   Report Status 01/14/2015 FINAL  Final     Studies: Dg Chest 1  View  01/15/2015   CLINICAL DATA:  Chest pain for 1 day.  EXAM: CHEST 1 VIEW  COMPARISON:  January 12, 2015, August 01, 2013  FINDINGS: The heart size and mediastinal contours are stable. Patient status post prior median sternotomy and CABG. Both lungs are clear. The visualized skeletal structures are stable.  IMPRESSION: No active cardiopulmonary disease.   Electronically Signed   By: Sherian Rein M.D.   On: 01/15/2015 21:49    Scheduled Meds:  Scheduled Meds: . amiodarone  200 mg Oral Daily  . aspirin EC  325 mg Oral QPM  . atorvastatin  40 mg Oral  [id number]  . cefTRIAXone (ROCEPHIN)  IV  1 g Intravenous Q24H  . feeding supplement (ENSURE ENLIVE)  237 mL Oral TID BM  . fluticasone  2 spray Each Nare Daily  . folic acid  1 mg Oral Daily  . heparin  5,000 Units Subcutaneous 3 times per day  . latanoprost  1 drop Both Eyes QHS  . lisinopril  40 mg Oral Daily  . memantine  10 mg Oral BID  . metoprolol tartrate  25 mg Oral BID  . multivitamin with minerals  0.5 tablet Oral BID  . pantoprazole  40 mg Oral Daily  . sodium chloride  3 mL Intravenous Q12H   Continuous Infusions: . sodium chloride 75 mL/hr at 01/16/15 0415    Time spent on care of this patient: 35 min   Ryann Leavitt, MD 01/16/2015, 4:52 PM  LOS: 0 days   [facility] Office  [phone] Pager - Text Page per [email or web address] If 7PM-7AM, please contact night-coverage [email or web address]

## 2015-01-16 NOTE — Evaluation (Signed)
Physical Therapy Evaluation Patient Details Name: Jacqueline [name] MRN: [id number] DOB: Feb 01, 1929 Today's Date: 01/16/2015   History of Present Illness  Jacqueline [name] is a 81 y.o. female with PMH of dementia, coronary artery disease status post CABG 5, remote paroxysmal A. fib followed by cardiology on no anticoagulation, hypertension, hyperlipidemia, GERD, osteoporosis, diverticulitis, compression fracture T12, who presents with a generalized weakness and increased urinary frequency.  Clinical Impression  Pt admitted with the above diagnosis. Pt currently with functional limitations due to the deficits listed below (see PT Problem List). Demonstrates generalized weakness and poor tolerance to short distance ambulation. Limited assistance at home as daughter is in school during the day. Reports fall at home and does require physical assist to safely ambulate and mobilize out of bed today. Greatest complaint at this time is her central/upper abdominal pain. Pt will benefit from skilled PT to increase their independence and safety with mobility to allow discharge to the venue listed below.       Follow Up Recommendations SNF;Supervision for mobility/OOB    Equipment Recommendations  None recommended by PT    Recommendations for Other Services       Precautions / Restrictions Precautions Precautions: Fall Restrictions Weight Bearing Restrictions: No      Mobility  Bed Mobility Overal bed mobility: Needs Assistance Bed Mobility: Supine to Sit;Sit to Supine     Supine to sit: Min assist Sit to supine: Min guard   General bed mobility comments: Min assist for pt to pull through PTs hand to transition to edge of bed. Extra time and effort to scoot anteriorly to edge of bed.  Transfers Overall transfer level: Needs assistance Equipment used: Rolling walker (2 wheeled) Transfers: Sit to/from Stand Sit to Stand: Min assist         General transfer comment: Min  assist for boost to rise from lowest bed setting. VC for hand placement.  Ambulation/Gait Ambulation/Gait assistance: Min assist Ambulation Distance (Feet): 25 Feet Assistive device: Rolling walker (2 wheeled) Gait Pattern/deviations: Step-to pattern;Decreased stride length;Shuffle;Trunk flexed Gait velocity: slow Gait velocity interpretation: <1.8 ft/sec, indicative of risk for recurrent falls General Gait Details: Very slow, requiring frequent cues for larger steps and foot clearance due to shuffling. Easily distracted. Min assist for walker control intermittently.  Stairs            Wheelchair Mobility    Modified Rankin (Stroke Patients Only)       Balance Overall balance assessment: Needs assistance;History of Falls Sitting-balance support: Feet supported;No upper extremity supported Sitting balance-Leahy Scale: Fair     Standing balance support: Bilateral upper extremity supported Standing balance-Leahy Scale: Poor                               Pertinent Vitals/Pain Pain Assessment: 0-10 Pain Score: 9  Pain Location: abdomen Pain Descriptors / Indicators: Cramping Pain Intervention(s): Monitored during session;Repositioned    Home Living Family/patient expects to be discharged to:: Private residence Living Arrangements: Children Available Help at Discharge: Family;Available 24 hours/day (daughter in class during the day) Type of Home: House Home Access: Stairs to enter Entrance Stairs-Rails: Doctor, general practice of Steps: 5 Home Layout: One level Home Equipment: Walker - 4 wheels;Bedside commode;Shower seat      Prior Function Level of Independence: Needs assistance   Gait / Transfers Assistance Needed: Uses rollator  ADL's / Homemaking Assistance Needed: assist for bath.  Hand Dominance   Dominant Hand: Right    Extremity/Trunk Assessment   Upper Extremity Assessment: Defer to OT evaluation            Lower Extremity Assessment: Generalized weakness         Communication   Communication: No difficulties  Cognition Arousal/Alertness: Awake/alert Behavior During Therapy: WFL for tasks assessed/performed Overall Cognitive Status: History of cognitive impairments - at baseline (Oriented x4)       Memory: Decreased short-term memory              General Comments General comments (skin integrity, edema, etc.): Reports she fell yesterday PTA. Complains of central/upper abdominal pain.    Exercises General Exercises - Lower Extremity Ankle Circles/Pumps: AROM;Both;10 reps;Supine      Assessment/Plan    PT Assessment Patient needs continued PT services  PT Diagnosis Difficulty walking;Abnormality of gait;Generalized weakness;Acute pain   PT Problem List Decreased strength;Decreased activity tolerance;Decreased balance;Decreased mobility;Decreased cognition;Decreased knowledge of use of DME;Decreased knowledge of precautions;Decreased safety awareness;Pain  PT Treatment Interventions     PT Goals (Current goals can be found in the Care Plan section) Acute Rehab PT Goals Patient Stated Goal: No pain PT Goal Formulation: With patient Time For Goal Achievement: 01/30/15 Potential to Achieve Goals: Good    Frequency Min 3X/week   Barriers to discharge Decreased caregiver support Daughter in school during the day. Pt will be alone    Co-evaluation               End of Session Equipment Utilized During Treatment: Gait belt Activity Tolerance: Patient tolerated treatment well Patient left: in bed;with call bell/phone within reach;with bed alarm set;with SCD's reapplied Nurse Communication: Mobility status         Time: 1446-1511 PT Time Calculation (min) (ACUTE ONLY): 25 min   Charges:   PT Evaluation $Initial PT Evaluation Tier I: 1 Procedure PT Treatments $Therapeutic Activity: 8-22 mins   PT G CodesBerton Mount 01/16/2015, 3:46  PM Sunday Spillers [place], [place] [phone]

## 2015-01-17 DIAGNOSIS — R531 Weakness: Secondary | ICD-10-CM

## 2015-01-17 DIAGNOSIS — R630 Anorexia: Secondary | ICD-10-CM

## 2015-01-17 DIAGNOSIS — I4891 Unspecified atrial fibrillation: Secondary | ICD-10-CM

## 2015-01-17 DIAGNOSIS — I350 Nonrheumatic aortic (valve) stenosis: Secondary | ICD-10-CM

## 2015-01-17 DIAGNOSIS — K869 Disease of pancreas, unspecified: Secondary | ICD-10-CM

## 2015-01-17 LAB — BASIC METABOLIC PANEL
ANION GAP: 9 (ref 5–15)
BUN: 6 mg/dL (ref 6–20)
CO2: 25 mmol/L (ref 22–32)
Calcium: 8.4 mg/dL — ABNORMAL LOW (ref 8.9–10.3)
Chloride: 101 mmol/L (ref 101–111)
Creatinine, Ser: 0.54 mg/dL (ref 0.44–1.00)
Glucose, Bld: 101 mg/dL — ABNORMAL HIGH (ref 65–99)
POTASSIUM: 3.8 mmol/L (ref 3.5–5.1)
SODIUM: 135 mmol/L (ref 135–145)

## 2015-01-17 MED ORDER — CIPROFLOXACIN HCL 500 MG PO TABS: 500.0000 mg | ORAL_TABLET | Freq: Two times a day (BID) | ORAL | Status: DC

## 2015-01-17 MED ORDER — POLYETHYLENE GLYCOL 3350 17 G PO PACK: 17.0000 g | PACK | Freq: Every day | ORAL | Status: DC | PRN

## 2015-01-17 MED ORDER — DICLOFENAC SODIUM 1 % TD GEL
2.0000 g | Freq: Four times a day (QID) | TRANSDERMAL | Status: DC
  Administered 2015-01-18: 2 g via TOPICAL
  Filled 2015-01-17 (×2): qty 100

## 2015-01-17 MED ORDER — SODIUM CHLORIDE 0.9 % IV SOLN: INTRAVENOUS | Status: DC

## 2015-01-17 MED ORDER — ENSURE ENLIVE PO LIQD: 237.0000 mL | Freq: Three times a day (TID) | ORAL | Status: DC

## 2015-01-17 NOTE — Clinical Social Work Note (Signed)
Clinical Social Work Assessment  Patient Details  Name: Jacqueline [name] MRN: [id number] Date of Birth: 03-25-1929  Date of referral:  01/17/15               Reason for consult:  Discharge Planning                Permission sought to share information with:  Family Supports (Son, Viviann Spare and daughter-in-law, Marylene Land) Permission granted to share information::  Yes, Verbal Permission Granted  Name::      Lovenia Kim)  Agency::   (n/a)  Relationship::   (Son and Daughter-in-law)  Solicitor Information:   (Son, Viviann Spare)  Housing/Transportation Living arrangements for the past 2 months:   (Lives with son and daughter-in-law) Source of Information:  Patient Patient Interpreter Needed:  None Criminal Activity/Legal Involvement Pertinent to Current Situation/Hospitalization:  No - Comment as needed Significant Relationships:  Adult Children Lives with:  Adult Children Do you feel safe going back to the place where you live?  Yes Need for family participation in patient care:  Yes (Comment)  Care giving concerns:   Although patient mentioned that she lives with both her son, Viviann Spare and daughter-in-law Marylene Land, she was concerned that if she was not able to return back to [facility] her children would not be home all day to give her appropriate care.    Social Worker assessment / plan:   BSW intern spoke with patient at bedside to discuss discharge planning. BSW intern informed patient that PT has recommended short-term rehab at [facility] for continued care. Patient informed BSW intern that she was aware of this recommendation and that if possible, she would like to return to [facility]. However, patient later stated that she was recently discharged from [facility] about a week ago. Patient stated that if by chance she will not be able to return to [facility], she would rather return home with her son and daughter-in law Marylene Land. During visitation, BSW intern noticed that even though patient  is alert and oriented x4, she appeared slightly confused. Patient is well aware that she may be experiencing an early onset of Alzheimer's. BSW intern followed up with patient's son, Deardra Hinkley by phone to inform him on the updates of his mother. Viviann Spare mentioned that he believes his mother may have used all of her medicare days. Furthermore, BSW intern informed Viviann Spare that his mother's chart will still be sent to [facility] also the number of medicare days left (if any) will be checked into as well.   Employment status:  Unemployed Health and safety inspector:  Conservation officer, historic buildings ) PT Recommendations:  Skilled Nursing Facility Information / Referral to community resources:  Skilled Nursing Facility  Patient/Family's Response to care:   Patient's son was very understanding of his mother's care giving needs. He also appreciated the follow up phone call made by the [facility] intern.   Patient/Family's Understanding of and Emotional Response to Diagnosis, Current Treatment, and Prognosis:   Patient was very calm and receptive to current discharge planning and care giving needs. Emotional Assessment Appearance:  Appears stated age Attitude/Demeanor/Rapport:   (polite and understanding) Affect (typically observed):  Calm, Appropriate, Accepting Orientation:  Oriented to Situation, Oriented to  Time, Oriented to Place, Oriented to Self Alcohol / Substance use:  Never Used Psych involvement (Current and /or in the community):  No (Comment)  Discharge Needs  Concerns to be addressed:  Discharge Planning Concerns Readmission within the last 30 days:  Yes Current discharge risk:  None Barriers to Discharge:  Continued Medical Work up   [employer], Student-SW 01/17/2015, 11:34 AM

## 2015-01-17 NOTE — Care Management Note (Addendum)
Case Management Note  Patient Details  Name: Jacqueline [name] MRN: [id number] Date of Birth: 1928/09/29  Subjective/Objective:        NCM received call from CSW that son state they can  Not pay $160 co pay daily for a facility, and she has not reached her 60 day period to be out of the facility, she just was in [facility] previously.   Also so she will be going home with hh services, patient [facility], referral made Tiffany with [facility] for [facility], PT, OT aide and social worker.  Soc will begin 24-48 hrs post dc.    NCM spoke with daughter n law, she states she can not get patient up the stairs at the house and she will need ambulance transport home.  CSW aware.          Action/Plan:   Expected Discharge Date:                  Expected Discharge Plan:  Home w Home Health Services  In-House Referral:  Clinical Social Work  Discharge planning Services  CM Consult  Post Acute Care Choice:  Home Health Choice offered to:  Patient  DME Arranged:    DME Agency:     HH Arranged:  RN, PT, OT, Nurse's Aide, Social Work [employer] Agency:  [facility]  Status of Service:  Completed, signed off  Medicare Important Message Given:    Date Medicare IM Given:    Medicare IM give by:    Date Additional Medicare IM Given:    Additional Medicare Important Message give by:     If discussed at Long Length of Stay Meetings, dates discussed:    Additional Comments:  Leone Haven, RN 01/17/2015, 1:04 PM

## 2015-01-17 NOTE — Progress Notes (Signed)
Patient has been unable to void since foley had been taken out at 1214. Bladder scan volume showed >515ml. Order to in and out cath and to keep patient here overnight to watch her and see if she is able to void on her own. Discharge order was cancelled. Patient and daughter-in-law, Marylene Land, were made aware that patient will be kept in the hospital overnight. Will continue to monitor.

## 2015-01-17 NOTE — Progress Notes (Signed)
Pharmacist Provided - Patient Medication Education Prior to Discharge   Jacqueline [name] is an 81 y.o. female who presented to [facility] on 01/15/2015 with a chief complaint of  Chief Complaint  Patient presents with  . Weakness      Patient will be discharged with 1 new medications  The following medications were discussed with the patient: Cipro  Pain Control medications:  Yes     No  Diabetes Medications:  Yes     No  Heart Failure Medications:  Yes     No  Anticoagulation Medications:   Yes     No  Antibiotics at discharge:  Yes     No  Allergy Assessment Completed and Updated:  Yes     No Identified Patient Allergies: No Known Allergies   Barriers to Obtaining Medications:  Yes  No  Assessment: Attempted to speak with patient about medications, she asked me to call her daughter-in-law and speak with her because she will be helping her with her medications. I called daughter-in-law Momoko Slezak) and spoke with her about the new medication, Cipro, that the patient will be taking when home. Educated the caregiver on separating the abx from the patient's multivitamin. Also reminded the caregiver that it was important to finish full course of abx. Caregiver had no questions.    Time spent preparing for discharge counseling: Time spent counseling patient:  Remi Haggard, PharmD Clinical Pharmacist- Resident Pager: [phone]   Remi Haggard, PharmD 01/17/2015, 5:56 PM

## 2015-01-17 NOTE — Care Management Note (Signed)
Case Management Note  Patient Details  Name: Jacqueline [name] MRN: [id number] Date of Birth: [birth year]  Subjective/Objective:      Patient is for dc to snf today, CSW aware and following.              Action/Plan:   Expected Discharge Date:                  Expected Discharge Plan:  Skilled Nursing Facility  In-House Referral:  Clinical Social Work  Discharge planning Services  CM Consult  Post Acute Care Choice:    Choice offered to:     DME Arranged:    DME Agency:     HH Arranged:    HH Agency:     Status of Service:  Completed, signed off  Medicare Important Message Given:    Date Medicare IM Given:    Medicare IM give by:    Date Additional Medicare IM Given:    Additional Medicare Important Message give by:     If discussed at Long Length of Stay Meetings, dates discussed:    Additional Comments:  Leone Haven, RN 01/17/2015, 12:24 PM

## 2015-01-17 NOTE — Evaluation (Signed)
Occupational Therapy Evaluation Patient Details Name: Jacqueline [name] MRN: [id number] DOB: Sep 22, 1928 Today's Date: 01/17/2015    History of Present Illness Jacqueline [name] is a 81 y.o. female with PMH of dementia, coronary artery disease status post CABG 5, remote paroxysmal A. fib followed by cardiology on no anticoagulation, hypertension, hyperlipidemia, GERD, osteoporosis, diverticulitis, compression fracture T12, who presents with a generalized weakness and increased urinary frequency.  Pt with UTI   Clinical Impression   Pt admitted with the above diagnosis and has the deficits listed below. Pt would benefit from cont OT to increase independence with adls back to a mod I level of care so she can eventually dc back home with her son and daughter in law who are NOT there during the day.  Pt may benefit from a tune up in SNF rehab to ensure safety at home and decrease the risk of future falls.  Educated pt on need to keep rollator with her at all times when at home to avoid falls.     Follow Up Recommendations  SNF;Supervision/Assistance - 24 hour    Equipment Recommendations  None recommended by OT    Recommendations for Other Services       Precautions / Restrictions Precautions Precautions: Fall Restrictions Weight Bearing Restrictions: No      Mobility Bed Mobility               General bed mobility comments: pt in chair on arrival.  Transfers Overall transfer level: Needs assistance Equipment used: Rolling walker (2 wheeled) Transfers: Sit to/from [employer] Sit to Stand: Min guard Stand pivot transfers: Min assist       General transfer comment: Cues for hand placement    Balance Overall balance assessment: Needs assistance Sitting-balance support: No upper extremity supported;Feet supported Sitting balance-Leahy Scale: Good     Standing balance support: During functional activity;Bilateral upper extremity  supported Standing balance-Leahy Scale: Poor Standing balance comment: pt did need outside assist to remain in standing when up                            ADL Overall ADL's : Needs assistance/impaired Eating/Feeding: Independent;Sitting Eating/Feeding Details (indicate cue type and reason): not eating well. Grooming: Therapist, nutritional;Wash/dry hands;Oral care;Set up;Sitting   Upper Body Bathing: Set up;Sitting   Lower Body Bathing: Min guard;Sit to/from stand Lower Body Bathing Details (indicate cue type and reason): min guard to maintain balance in standing. Upper Body Dressing : Set up;Sitting   Lower Body Dressing: Minimal assistance;Sit to/from stand Lower Body Dressing Details (indicate cue type and reason): min assist to stand and manage clothing in standing.  pt donned socks w/o assist but struggles with L side due to hip replacement. Toilet Transfer: Minimal assistance;BSC;Stand-pivot   Toileting- Architect and Hygiene: Min guard;Sit to/from stand Toileting - Clothing Manipulation Details (indicate cue type and reason): min guard due to decresed balance.     Functional mobility during ADLs: Minimal assistance General ADL Comments: Pt doing well but is very nervous now that she fell at home during adls.  Pt needs to keep rollator with her at all times.  At times, she pushes it to side and will transfer in the other direction.  Spoke to her about trying to have it with her at all times so her knees give way she can still stay on her feet.     Vision Vision Assessment?: No apparent visual deficits  Perception     Praxis      Pertinent Vitals/Pain Pain Assessment: 0-10 Pain Score: 6  Pain Location: abdomen Pain Descriptors / Indicators: Cramping;Constant Pain Intervention(s): Monitored during session;Repositioned;Limited activity within patient's tolerance     Hand Dominance Right   Extremity/Trunk Assessment Upper Extremity Assessment Upper  Extremity Assessment: Generalized weakness   Lower Extremity Assessment Lower Extremity Assessment: Defer to PT evaluation   Cervical / Trunk Assessment Cervical / Trunk Assessment: Kyphotic   Communication Communication Communication: No difficulties   Cognition Arousal/Alertness: Awake/alert Behavior During Therapy: WFL for tasks assessed/performed Overall Cognitive Status: History of cognitive impairments - at baseline       Memory: Decreased short-term memory             General Comments       Exercises       Shoulder Instructions      Home Living Family/patient expects to be discharged to:: Private residence Living Arrangements: Children Available Help at Discharge: Family;Available PRN/intermittently (daughter in law in school all day.) Type of Home: House Home Access: Stairs to enter [employer] of Steps: 5 Entrance Stairs-Rails: Right;Left Home Layout: One level     Bathroom Shower/Tub: Tub/shower unit;Curtain Shower/tub characteristics: Engineer, building services: Standard Bathroom Accessibility: Yes How Accessible: Accessible via walker Home Equipment: Walker - 4 wheels;Bedside commode;Shower seat          Prior Functioning/Environment Level of Independence: Needs assistance  Gait / Transfers Assistance Needed: Uses rollator ADL's / Homemaking Assistance Needed: pt has been mod I but feels she will need assist for bath now.        OT Diagnosis: Generalized weakness;Acute pain   OT Problem List: Decreased strength;Decreased activity tolerance;Impaired balance (sitting and/or standing);Decreased cognition;Decreased safety awareness;Decreased knowledge of use of DME or AE;Pain   OT Treatment/Interventions: Self-care/ADL training;Therapeutic activities;DME and/or AE instruction    OT Goals(Current goals can be found in the care plan section) Acute Rehab OT Goals Patient Stated Goal: to be able to walk again and not worry about  falling OT Goal Formulation: With patient Time For Goal Achievement: 01/31/15 Potential to Achieve Goals: Good ADL Goals Pt Will Perform Lower Body Bathing: with supervision;sit to/from stand Pt Will Perform Lower Body Dressing: with supervision;sit to/from stand Pt Will Perform Tub/Shower Transfer: with supervision;Tub transfer;ambulating;shower seat;rolling walker Additional ADL Goal #1: Pt will walk to bathroom with rollator and do all toileting tasks with S. Additional ADL Goal #2: Pt will gather clothes with rolling walker with S.  OT Frequency: Min 2X/week   Barriers to D/C: Decreased caregiver support  daughter is not home with pt during the day.       Co-evaluation              End of Session Equipment Utilized During Treatment: Engineer, water Communication: Mobility status  Activity Tolerance: No increased pain;Patient limited by fatigue Patient left: in chair;with call bell/phone within reach   Time: 1159-1220 OT Time Calculation (min): 21 min Charges:  OT General Charges $OT Visit: 1 Procedure OT Evaluation $Initial OT Evaluation Tier I: 1 Procedure G-Codes:    Hope Budds 01/19/2015, 12:32 PM  [phone]

## 2015-01-17 NOTE — Progress Notes (Signed)
Patient and daughter-in-law notified that patient will be able to be discharged once she voids since her Foley catheter was taken out at 1214 today. Will call for ambulance transport to home once patient voids.

## 2015-01-17 NOTE — Discharge Summary (Addendum)
Physician Discharge Summary  Jacqueline [name] ZOX:[id number] DOB: [birth year] DOA: 01/15/2015  PCP: [name], MD  Admit date: 01/15/2015 Discharge date: 01/17/2015  Time spent: 55 minutes  Recommendations for Outpatient Follow-up:  1. Bmet in 1 wk 2. SNF recommended however family is not able to pay the cost and therefore have opted to take her home with home health  Discharge Condition: stable  Discharge Diagnoses:  Principal Problem:   Hyponatremia Active Problems:   UTI (lower urinary tract infection)   HTN (hypertension)   Pancreatic lesion   CAD (coronary artery disease)   Atrial fibrillation (HCC)   Aortic stenosis   Hyperlipidemia   Generalized weakness   Compression fracture of T12 vertebra (HCC)   Anorexia   History of present illness:  Jacqueline [name] is a 81 y.o. female with past medical history dementia, coronary artery disease status post CABG 5, paroxysmal atrial fibrillation not on anticoagulation, hypertension hyperlipidemia who presents to the hospital for generalized weakness and increased frequency of micturition. She was diagnosed with a UTI this past Saturday and started on Keflex but continued to have increased frequency of urination. She also complained of some epigastric pain. In the ER she was found to have a UTI, hypertension with a BP of 212/88, sodium of 121.   Hospital Course:  Principal Problem:  Hyponatremia -has had a prior episode of this which corrected with IV fluids -She admits to poor by mouth intake and therefore the suspicion is that it is likely due to hypovolemia -Urine studies were ordered on admission and done late this afternoon after the patient had received IV fluids-urine sodium is 30 urine osmolality 552-  Continued IVF- sodium level normalized as of today -Encouraged to increase by mouth intake- have ordered Ensure TID for home  Active Problems: UTI -Was being treated with Keflex at outpatient and placed on  Rocephin while in the hospital -  Culture does not reveal growth as of yet- will follow but it may be sterile as she was already on antibiotics - will d/c home with Ciprofloxacin   Anorexia -Started dietary supplements- Ensure TID  Hypokalemia -Replaced adequately  Paroxysmal A. Fib -CHA2DS2-VASc Score is 4 but apparently she is a fall risk and her cardiologist recommends aspirin only --Continue metoprolol, amiodarone, aspirin 325 mg   HTN (hypertension) -Continue lisinopril, metoprolol   Pancreatic lesion -Stable cystic mass in the tail of the pancreas   CAD (coronary artery disease) status post CABG -Continue aspirin, metoprolol, Lipitor  Mild aortic stenosis  Hyperlipidemia -Continue Lipitor  Dementia -Continue Namenda    Discharge Exam: Filed Weights   01/16/15 0144  Weight: 50.8 kg (111 lb 15.9 oz)   Filed Vitals:   01/17/15 0921  BP: 159/70  Pulse: 61  Temp:   Resp:     General: AAO x 3, no distress Cardiovascular: RRR, no murmurs  Respiratory: clear to auscultation bilaterally GI: soft, non-tender, non-distended, bowel sound positive  Discharge Instructions You were cared for by a hospitalist during your hospital stay. If you have any questions about your discharge medications or the care you received while you were in the hospital after you are discharged, you can call the unit and asked to speak with the hospitalist on call if the hospitalist that took care of you is not available. Once you are discharged, your primary care physician will handle any further medical issues. Please note that NO REFILLS for any discharge medications will be authorized once you are discharged, as it is  imperative that you return to your primary care physician (or establish a relationship with a primary care physician if you do not have one) for your aftercare needs so that they can reassess your need for medications and monitor your lab values.      Discharge  Instructions    Discharge instructions    Complete by:  As directed   Regular diet. Make sure you are eating and drinking well- take Ensure 3 times a day     Increase activity slowly    Complete by:  As directed             Medication List    STOP taking these medications        cephALEXin 500 MG capsule  Commonly known as:  KEFLEX      TAKE these medications        alendronate 70 MG tablet  Commonly known as:  FOSAMAX  TAKE 1 TABLET BY MOUTH EVERY 7 DAYS. TAKE WITH A FULL GLASS OF WATER ON AN EMPTY STOMACH     amiodarone 200 MG tablet  Commonly known as:  PACERONE  TAKE 1 TABLET BY MOUTH DAILY.     aspirin EC 325 MG tablet  Take 325 mg by mouth every evening.     atorvastatin 40 MG tablet  Commonly known as:  LIPITOR  TAKE 1 TABLET BY MOUTH DAILY AT 6 PM     ciprofloxacin 500 MG tablet  Commonly known as:  CIPRO  Take 1 tablet (500 mg total) by mouth every 12 (twelve) hours.     desloratadine 5 MG tablet  Commonly known as:  CLARINEX  Take 1 tablet (5 mg total) by mouth daily.     feeding supplement (ENSURE ENLIVE) Liqd  Take 237 mLs by mouth 3 (three) times daily between meals.     fluticasone 50 MCG/ACT nasal spray  Commonly known as:  FLONASE  Place 2 sprays into both nostrils daily.     folic acid 1 MG tablet  Commonly known as:  FOLVITE  TAKE 1 TABLET (1 MG TOTAL) BY MOUTH DAILY.     lisinopril 40 MG tablet  Commonly known as:  PRINIVIL,ZESTRIL  Take 1 tablet (40 mg total) by mouth daily.     LUMIGAN 0.01 % Soln  Generic drug:  bimatoprost  PLACE 1 DROP INTO BOTH EYES AT BEDTIME.     memantine 10 MG tablet  Commonly known as:  NAMENDA  Take 1 tablet (10 mg total) by mouth 2 (two) times daily.     metoprolol tartrate 25 MG tablet  Commonly known as:  LOPRESSOR  Take 1 tablet (25 mg total) by mouth 2 (two) times daily.     multivitamin tablet  Take 0.5 tablets by mouth 2 (two) times daily.     omeprazole 20 MG capsule  Commonly known as:   PRILOSEC  Take 1 capsule (20 mg total) by mouth daily.     polyethylene glycol packet  Commonly known as:  MIRALAX / GLYCOLAX  Take 17 g by mouth daily as needed.       No Known Allergies Follow-up Information    Follow up with [facility].   Why:  HHRN, PT, OT, aide and social worker   Contact information:   [address] [place] [address] [phone]        The results of significant diagnostics from this hospitalization (including imaging, microbiology, ancillary and laboratory) are listed below for reference.  Significant Diagnostic Studies: Dg Chest 1 View  01/15/2015   CLINICAL DATA:  Chest pain for 1 day.  EXAM: CHEST 1 VIEW  COMPARISON:  January 12, 2015, August 01, 2013  FINDINGS: The heart size and mediastinal contours are stable. Patient status post prior median sternotomy and CABG. Both lungs are clear. The visualized skeletal structures are stable.  IMPRESSION: No active cardiopulmonary disease.   Electronically Signed   By: Sherian Rein M.D.   On: 01/15/2015 21:49   Dg Chest 2 View  01/12/2015   CLINICAL DATA:  Upper abdominal and chest pain, hypertension, coronary disease  EXAM: CHEST - 2 VIEW  COMPARISON:  10/20/2014  FINDINGS: Previous CABG. Mild cardiomegaly. Ectatic atheromatous thoracic aorta. Patchy infiltrate or subsegmental atelectasis at the left lung base, partially obscuring the left diaphragmatic leaflet. Linear scar or atelectasis in the right suprahilar region. Lungs otherwise clear. No effusion. Severe compression deformity in the lower thoracic spine as before. Greenfield filter.  IMPRESSION: 1. Mild cardiomegaly. 2. Patchy left lower lobe infiltrate or atelectasis.   Electronically Signed   By: Corlis Leak M.D.   On: 01/12/2015 08:56   Mr Abdomen W Wo Contrast  12/21/2014   CLINICAL DATA:  Followup cyst in tail of pancreas.  EXAM: MRI ABDOMEN WITHOUT AND WITH CONTRAST  TECHNIQUE: Multiplanar multisequence MR imaging of the  abdomen was performed both before and after the administration of intravenous contrast.  CONTRAST:  10mL MULTIHANCE GADOBENATE DIMEGLUMINE 529 MG/ML IV SOLN  COMPARISON:  12/06/2013  FINDINGS: Lower chest:  No pleural effusion.  Hepatobiliary: There is mild diffuse hepatic steatosis. No focal liver abnormality. Small stones are noted within the dependent portion of the gallbladder. No biliary dilatation.  Pancreas: Within the tail of pancreas there is a multi lobular cystic lesion measuring 2.1 x 1.1 cm, image number 15 of series 9. T2 hyperintense structure, image 15 of series 9. This contains enhancing internal septation, image 34 of series 1301. This is not significantly changed in size or appearance when compared with 12/06/2013.  Spleen: Negative  Adrenals/Urinary Tract: The adrenal glands are unremarkable. Bilateral renal cysts noted. The largest is in the left kidney measuring 6.8 cm, image 18 of series 9.  Stomach/Bowel: The stomach and visualized upper abdominal bowel loops are unremarkable.  Vascular/Lymphatic: Aortic atherosclerosis noted. No aneurysm. No upper abdominal adenopathy.  Other: No free fluid or fluid collections.  Musculoskeletal: Normal signal from within the bone marrow.  IMPRESSION: 1. Stable appearance of multi lobular cystic lesion within the tail of pancreas. The appearance remains nonspecific. Cannot rule out mucinous cystic neoplasm or side branch IPMN. Continued followup is recommended. The next followup examination should be obtained in 1 year. This recommendation follows ACR consensus guidelines: Managing Incidental Findings on Abdominal CT: White Paper of the ACR Incidental Findings Committee. J Am Coll Radiol 2010;7:754-773. 2. Hepatic steatosis.   Electronically Signed   By: Signa Kell M.D.   On: 12/21/2014 20:29   Ct Abdomen Pelvis W Contrast  01/12/2015   CLINICAL DATA:  Abdominal pain.  EXAM: CT ABDOMEN AND PELVIS WITH CONTRAST  TECHNIQUE: Multidetector CT imaging of  the abdomen and pelvis was performed using the standard protocol following bolus administration of intravenous contrast.  CONTRAST:  100 mL Omnipaque 300 IV  COMPARISON:  MR abdomen 12/21/2014.  CT abdomen 11/07/2012  FINDINGS: Lower chest: Mild bibasilar atelectasis. No infiltrate or effusion. Cardiac enlargement. Coronary calcification. Mitral annulus calcification.  Hepatobiliary: Liver is homogeneous without mass lesion. Small gallstones. Gallbladder  wall not thickened. Bile ducts nondilated.  Pancreas: Multilocular cystic lesion tail of pancreas is stable from the prior MRI. This measures 12 x 18 mm with several small clustered cysts. Remainder of the pancreas is normal.  Spleen: Negative  Adrenals/Urinary Tract: Large 7 cm left renal cyst. Small right renal cyst and small right renal cortical scar. Negative for hydronephrosis. No renal calculi. Urinary bladder is negative.  Stomach/Bowel: Negative for bowel obstruction.  No bowel edema  Vascular/Lymphatic: IVC filter in good position. Atherosclerotic aorta and iliac arteries without aneurysm.  Reproductive: Small uterus without pelvic mass lesion. No adnexal mass.  Other: Negative for ascites.  No free fluid.  Musculoskeletal: Severe compression fracture T12. This fracture has progressed since 2014. There is retropulsion of bone into the canal causing mild to moderate spinal stenosis. Right hip prosthesis in satisfactory position and alignment.  IMPRESSION: Cholelithiasis.  Multi cystic mass in the tail of pancreas is stable.  Severe compression fracture T12 appears chronic but has progressed since 2014. This is causing mild to moderate spinal stenosis.   Electronically Signed   By: Marlan Palau M.D.   On: 01/12/2015 12:39    Microbiology: Recent Results (from the past 240 hour(s))  Urine culture     Status: None   Collection Time: 01/12/15  9:55 AM  Result Value Ref Range Status   Specimen Description URINE, RANDOM  Final   Special Requests NONE   Final   Culture MULTIPLE SPECIES PRESENT, SUGGEST RECOLLECTION  Final   Report Status 01/14/2015 FINAL  Final  Culture, blood (x 2)     Status: None (Preliminary result)   Collection Time: 01/16/15  2:30 AM  Result Value Ref Range Status   Specimen Description BLOOD RIGHT ARM  Final   Special Requests IN PEDIATRIC BOTTLE 1CC  Final   Culture NO GROWTH 1 DAY  Final   Report Status PENDING  Incomplete  Culture, blood (x 2)     Status: None (Preliminary result)   Collection Time: 01/16/15  2:45 AM  Result Value Ref Range Status   Specimen Description BLOOD LEFT ARM  Final   Special Requests IN PEDIATRIC BOTTLE 3CC  Final   Culture NO GROWTH 1 DAY  Final   Report Status PENDING  Incomplete  Urine culture     Status: None (Preliminary result)   Collection Time: 01/16/15 11:23 AM  Result Value Ref Range Status   Specimen Description URINE, SUPRAPUBIC  Final   Special Requests NONE  Final   Culture CULTURE REINCUBATED FOR BETTER GROWTH  Final   Report Status PENDING  Incomplete     Labs: Basic Metabolic Panel:  Recent Labs Lab 01/12/15 0750 01/15/15 2106 01/16/15 0245 01/16/15 1540 01/17/15 0546  NA 135 121* 126* 125* 135  K 4.1 3.7 3.1* 4.0 3.8  CL 100* 86* 87* 94* 101  CO2 26 23 28  21* 25  GLUCOSE 120* 104* 139* 149* 101*  BUN 13 8 6 7 6   CREATININE 0.73 0.54 0.56 0.63 0.54  CALCIUM 10.0 8.4* 8.5* 8.1* 8.4*   Liver Function Tests:  Recent Labs Lab 01/12/15 0750 01/15/15 2106  AST 37 38  ALT 33 32  ALKPHOS 79 73  BILITOT 1.1 1.0  PROT 7.2 6.3*  ALBUMIN 3.6 3.3*    Recent Labs Lab 01/12/15 0750 01/16/15 0245  LIPASE 38 35   No results for input(s): AMMONIA in the last 168 hours. CBC:  Recent Labs Lab 01/12/15 0750 01/15/15 2106 01/16/15 0245  WBC 8.3  10.3 9.1  NEUTROABS  --  8.4*  --   HGB 13.7 13.2 13.3  HCT 42.0 37.1 39.3  MCV 104.2* 98.4 100.0  PLT 231 175 224   Cardiac Enzymes:  Recent Labs Lab 01/16/15 0245 01/16/15 0947  01/16/15 1540  TROPONINI <0.03 <0.03 <0.03   BNP: BNP (last 3 results)  Recent Labs  07/19/14 1818  BNP 191.0*    ProBNP (last 3 results) No results for input(s): PROBNP in the last 8760 hours.  CBG: No results for input(s): GLUCAP in the last 168 hours.     SignedCalvert Cantor, MD [facility] 01/17/2015, 1:05 PM

## 2015-01-17 NOTE — Clinical Social Work Note (Signed)
Clinical Social Worker has confirmed with [facility] that patient was recently discharged from SNF and has NOT had a 60 day wellness period. CSW has contacted pt's son, Viviann Spare and related findings from [facility]. CSW also informed pt's son that patient will have $160 daily co-pays for SNF. Pt's son stated he and family are unable to afford co-pays and was afraid patient would not have any skilled days left however was prepared for an alternative plan.   Pt's son further reported that he will be at home with patient during the day and his wife, Marylene Land is not employed and will be available to assist during the day and mostly nights. Pt's son agreeable to pt returning home with Home Health.   CSW has notified RNCM via voice message.   CSW will sign off for now as social work intervention is no longer needed. Please consult [place] again if new need arises.  Derenda Fennel, MSW, LCSWA [phone] 01/17/2015 12:53 PM

## 2015-01-18 LAB — BASIC METABOLIC PANEL
ANION GAP: 12 (ref 5–15)
BUN: 7 mg/dL (ref 6–20)
CO2: 27 mmol/L (ref 22–32)
Calcium: 8.3 mg/dL — ABNORMAL LOW (ref 8.9–10.3)
Chloride: 97 mmol/L — ABNORMAL LOW (ref 101–111)
Creatinine, Ser: 0.63 mg/dL (ref 0.44–1.00)
GLUCOSE: 99 mg/dL (ref 65–99)
POTASSIUM: 3 mmol/L — AB (ref 3.5–5.1)
Sodium: 136 mmol/L (ref 135–145)

## 2015-01-18 MED ORDER — POTASSIUM CHLORIDE CRYS ER 20 MEQ PO TBCR
40.0000 meq | EXTENDED_RELEASE_TABLET | Freq: Once | ORAL | Status: AC
  Administered 2015-01-18: 40 meq via ORAL
  Filled 2015-01-18: qty 2

## 2015-01-18 MED ORDER — TRAMADOL HCL 50 MG PO TABS
50.0000 mg | ORAL_TABLET | Freq: Once | ORAL | Status: AC
  Administered 2015-01-18: 50 mg via ORAL
  Filled 2015-01-18: qty 1

## 2015-01-18 NOTE — Progress Notes (Signed)
Physical Therapy Treatment Patient Details Name: CHEALSEY MIYAMOTO MRN: [id number] DOB: March 16, 1929 Today's Date: 01/18/2015    History of Present Illness NALEE LIGHTLE is a 81 y.o. female with PMH of dementia, coronary artery disease status post CABG 5, remote paroxysmal A. fib followed by cardiology on no anticoagulation, hypertension, hyperlipidemia, GERD, osteoporosis, diverticulitis, compression fracture T12, who presents with a generalized weakness and increased urinary frequency.    PT Comments    Pt making slow progress.  Follow Up Recommendations  Supervision for mobility/OOB;Home health PT (Pt not going to SNF due to copay.)     Equipment Recommendations  None recommended by PT    Recommendations for Other Services       Precautions / Restrictions Precautions Precautions: Fall Restrictions Weight Bearing Restrictions: No    Mobility  Bed Mobility Overal bed mobility: Needs Assistance Bed Mobility: Supine to Sit;Sit to Supine     Supine to sit: Min assist Sit to supine: Min assist   General bed mobility comments: Min assist for pt to pull through PTs hand to transition to edge of bed. Extra time and effort to scoot anteriorly to edge of bed.  Transfers Overall transfer level: Needs assistance Equipment used: 4-wheeled walker Transfers: Sit to/from [employer] Sit to Stand: Min guard;Min assist Stand pivot transfers: Min assist       General transfer comment: Min assist from bed but min guard from 3-in-1.  Ambulation/Gait Ambulation/Gait assistance: Min guard Ambulation Distance (Feet): 35 Feet Assistive device: 4-wheeled walker Gait Pattern/deviations: Step-to pattern;Decreased step length - right;Decreased step length - left;Shuffle Gait velocity: slow Gait velocity interpretation: Below normal speed for age/gender General Gait Details: Verbal cues to lengthen steps   Stairs            Wheelchair Mobility     Modified Rankin (Stroke Patients Only)       Balance   Sitting-balance support: No upper extremity supported;Feet supported Sitting balance-Leahy Scale: Fair     Standing balance support: Bilateral upper extremity supported Standing balance-Leahy Scale: Poor Standing balance comment: support of rollator                    Cognition Arousal/Alertness: Awake/alert Behavior During Therapy: WFL for tasks assessed/performed Overall Cognitive Status: History of cognitive impairments - at baseline (Oriented x4)       Memory: Decreased short-term memory              Exercises      General Comments        Pertinent Vitals/Pain Pain Assessment: Faces Faces Pain Scale: Hurts even more Pain Location: abdomen Pain Descriptors / Indicators: Cramping Pain Intervention(s): Monitored during session;Repositioned    Home Living                      Prior Function            PT Goals (current goals can now be found in the care plan section) Acute Rehab PT Goals Patient Stated Goal: No pain PT Goal Formulation: With patient Time For Goal Achievement: 01/30/15 Potential to Achieve Goals: Good Progress towards PT goals: Progressing toward goals    Frequency  Min 3X/week    PT Plan Discharge plan needs to be updated    Co-evaluation             End of Session Equipment Utilized During Treatment: Gait belt Activity Tolerance: Patient tolerated treatment well Patient left: in bed;with call bell/phone within  reach;with bed alarm set     Time: [phone] PT Time Calculation (min) (ACUTE ONLY): 20 min  Charges:  $Gait Training: 8-22 mins                    G Codes:      Rie Mcneil 2015-01-19, 1:30 PM [employer] PT [phone]

## 2015-01-18 NOTE — Care Management Important Message (Signed)
Important Message  Patient Details  Name: MARINE LEZOTTE MRN: [id number] Date of Birth: [birth year]   Medicare Important Message Given:  Yes-second notification given    Kyla Balzarine 01/18/2015, 12:12 PM

## 2015-01-18 NOTE — Progress Notes (Addendum)
[facility]ists  The patient was not discharged home yesterday as I received a call in the evening in regards to her not being able to urinate after the Foley was removed. The bladder was noted to be significantly distended on bladder scan. An in and out cath was performed. She was subsequently able to void multiple times through the night and continues to void well without any difficulty. I have examined her today and discussed the plan with her in detail. She is stable to be discharged home with home health today. Please see my discharge summary from 10/6.  Calvert Cantor, MD

## 2015-01-18 NOTE — Care Management Note (Addendum)
Case Management Note  Patient Details  Name: Jacqueline [name] MRN: [id number] Date of Birth: March 16, 1929  Subjective/Objective:      Patient voided this am , she is for dc home with hh services today, [facility] notified.  Patient will need ambulance transport.   CSW aware.         Action/Plan:   Expected Discharge Date:                  Expected Discharge Plan:  Home w Home Health Services  In-House Referral:  Clinical Social Work  Discharge planning Services  CM Consult  Post Acute Care Choice:  Home Health Choice offered to:  Patient  DME Arranged:    DME Agency:     HH Arranged:  RN, PT, OT, Nurse's Aide, Social Work [employer] Agency:  [facility]  Status of Service:  Completed, signed off  Medicare Important Message Given:    Date Medicare IM Given:    Medicare IM give by:    Date Additional Medicare IM Given:    Additional Medicare Important Message give by:     If discussed at Long Length of Stay Meetings, dates discussed:    Additional Comments:  Leone Haven, RN 01/18/2015, 11:28 AM

## 2015-01-18 NOTE — Progress Notes (Signed)
Pt. Educated regarding medications, signs and symptoms of when to call physician, and discharge instructions. Pt. Discharged to home with family.  Dorathy Daft, RN

## 2015-01-19 LAB — URINE CULTURE: Culture: 4000

## 2015-01-21 LAB — CULTURE, BLOOD (ROUTINE X 2)
CULTURE: NO GROWTH
Culture: NO GROWTH

## 2015-01-22 ENCOUNTER — Ambulatory Visit (INDEPENDENT_AMBULATORY_CARE_PROVIDER_SITE_OTHER): Payer: Commercial Managed Care - HMO | Admitting: Family Medicine

## 2015-01-22 ENCOUNTER — Telehealth: Payer: Self-pay

## 2015-01-22 ENCOUNTER — Encounter: Payer: Self-pay | Admitting: Family Medicine

## 2015-01-22 VITALS — BP 140/82 | HR 52 | Temp 97.9°F | Resp 16 | Ht 66.0 in | Wt 108.0 lb

## 2015-01-22 DIAGNOSIS — F411 Generalized anxiety disorder: Secondary | ICD-10-CM

## 2015-01-22 DIAGNOSIS — S22080S Wedge compression fracture of T11-T12 vertebra, sequela: Secondary | ICD-10-CM

## 2015-01-22 DIAGNOSIS — M4854XS Collapsed vertebra, not elsewhere classified, thoracic region, sequela of fracture: Secondary | ICD-10-CM

## 2015-01-22 DIAGNOSIS — E871 Hypo-osmolality and hyponatremia: Secondary | ICD-10-CM

## 2015-01-22 DIAGNOSIS — E785 Hyperlipidemia, unspecified: Secondary | ICD-10-CM

## 2015-01-22 DIAGNOSIS — I10 Essential (primary) hypertension: Secondary | ICD-10-CM

## 2015-01-22 DIAGNOSIS — N390 Urinary tract infection, site not specified: Secondary | ICD-10-CM

## 2015-01-22 LAB — BASIC METABOLIC PANEL
BUN: 15 mg/dL (ref 6–23)
CALCIUM: 9.5 mg/dL (ref 8.4–10.5)
CO2: 31 meq/L (ref 19–32)
CREATININE: 0.66 mg/dL (ref 0.40–1.20)
Chloride: 99 mEq/L (ref 96–112)
GFR: 90.64 mL/min (ref >60.00)
Glucose, Bld: 75 mg/dL (ref 70–99)
Potassium: 4 mEq/L (ref 3.5–5.1)
Sodium: 136 mEq/L (ref 135–145)

## 2015-01-22 LAB — CBC WITH DIFFERENTIAL/PLATELET
Basophils Absolute: 0 10*3/uL (ref 0.0–0.1)
Basophils Relative: 0.2 % (ref 0.0–3.0)
EOS ABS: 0.1 10*3/uL (ref 0.0–0.7)
Eosinophils Relative: 0.5 % (ref 0.0–5.0)
HEMATOCRIT: 38.1 % (ref 36.0–46.0)
HEMOGLOBIN: 12.6 g/dL (ref 12.0–15.0)
Lymphs Abs: 0.7 10*3/uL (ref 0.7–4.0)
MCHC: 33.1 g/dL (ref 30.0–36.0)
MCV: 102.7 fl — AB (ref 78.0–100.0)
MONOS PCT: 7.2 % (ref 3.0–12.0)
Monocytes Absolute: 0.8 10*3/uL (ref 0.1–1.0)
NEUTROS ABS: 9.2 10*3/uL — AB (ref 1.4–7.7)
Neutrophils Relative %: 85.7 % — ABNORMAL HIGH (ref 43.0–77.0)
Platelets: 288 10*3/uL (ref 150.0–400.0)
RBC: 3.71 Mil/uL — ABNORMAL LOW (ref 3.87–5.11)
RDW: 15.8 % — ABNORMAL HIGH (ref 11.5–15.5)
WBC: 10.7 10*3/uL — AB (ref 4.0–10.5)

## 2015-01-22 LAB — LIPID PANEL
Cholesterol: 97 mg/dL (ref 0–200)
HDL: 49.3 mg/dL (ref >39.00)
LDL Cholesterol: 34 mg/dL (ref 0–99)
NONHDL: 47.63
TRIGLYCERIDES: 70 mg/dL (ref 0.0–149.0)
Total CHOL/HDL Ratio: 2
VLDL: 14 mg/dL (ref 0.0–40.0)

## 2015-01-22 LAB — HEPATIC FUNCTION PANEL
ALBUMIN: 3.4 g/dL — AB (ref 3.5–5.2)
ALT: 28 U/L (ref 0–35)
AST: 28 U/L (ref 0–37)
Alkaline Phosphatase: 77 U/L (ref 39–117)
BILIRUBIN DIRECT: 0.1 mg/dL (ref 0.0–0.3)
TOTAL PROTEIN: 6.6 g/dL (ref 6.0–8.3)
Total Bilirubin: 0.4 mg/dL (ref 0.2–1.2)

## 2015-01-22 LAB — MAGNESIUM: Magnesium: 1.9 mg/dL (ref 1.5–2.5)

## 2015-01-22 MED ORDER — DIAZEPAM 2 MG PO TABS: ORAL_TABLET | ORAL | Status: DC

## 2015-01-22 NOTE — Assessment & Plan Note (Signed)
Ongoing issue for pt.  She has had this for years and is not interested in any treatment at this time.  Pt takes Tramadol as needed for pain

## 2015-01-22 NOTE — Assessment & Plan Note (Signed)
Chronic problem, adequate control.  Asymptomatic.  Check BMP.  Will follow.

## 2015-01-22 NOTE — Patient Instructions (Signed)
Follow up in 1 month to recheck anxiety We'll notify you of your lab results and make any changes if needed Start a 1/2 tab of Valium daily as an anxiety controller medication- use the other 1/2 as needed for panicked moments Continue to drink plenty of fluids No med changes at this time Call with any questions or concerns Hang in there!!!

## 2015-01-22 NOTE — Assessment & Plan Note (Signed)
Ongoing issue for pt.  We attempted to tx w/ SSRI previously but she ended up hospitalized w/ hyponatremia.  Based on this, will start low dose Valium- 1/2 tab daily- and then extra 1/2 tab as needed.  Reviewed possible side effects- confusion, risk of falls- and pt and daughter-in-law expressed understanding.

## 2015-01-22 NOTE — Progress Notes (Signed)
Pre visit review using our clinic review tool, if applicable. No additional management support is needed unless otherwise documented below in the visit note. 

## 2015-01-22 NOTE — Telephone Encounter (Signed)
PCP: [name], MD  Admit date: 01/15/2015 Discharge date: 01/17/2015  Reason for admission: hyponatremia, uti, compression fxs  Recommendations for Outpatient Follow-up:  1. Bmet in 1 wk 2. SNF recommended however family is not able to pay the cost and therefore have opted to take her home with home health   Left message for call back.

## 2015-01-22 NOTE — Assessment & Plan Note (Signed)
Chronic problem.  Tolerating statin w/o difficulty.  Check labs.  Adjust meds prn  

## 2015-01-22 NOTE — Assessment & Plan Note (Signed)
Pt is currently asymptomatic.  Being appropriately treated w/ Cipro.

## 2015-01-22 NOTE — Progress Notes (Signed)
   Subjective:    Patient ID: Jacqueline [name], female    DOB: 1928/10/12, 81 y.o.   MRN: [id number]  HPI HTN- chronic problem, adequate control on Metoprolol.  Denies CP, SOB, HAs, visual changes, edema.  Hyperlipidemia- chronic problem, on Lipitor.  Due for labs.  Denies abd pain, N/V.  UTI- pt was hospitalized from 10/4-10/6 due to urinary retention and UTI.  Was initially treated outpt w/ Keflex and then received Rocephin while hospitalized and d/c'd home w/ Cipro.  Pt denies current sxs.  Hyponatremia- pt has hx of similar.  This corrected w/ IV fluids.  Pt is due for repeat BMP today.  Pt is drinking gatorade, water.  Compression fx- pt would like to leave this alone.  Anxiety- ongoing issue for pt.  Did not tolerate SSRI due to hyponatremia.  Pt aware that her anxiety is causing elevated BP and is almost debilitating on some days.   Review of Systems For ROS see HPI     Objective:   Physical Exam  Constitutional: She is oriented to person, place, and time.  Frail elderly woman sitting in wheel chair  HENT:  Head: Normocephalic and atraumatic.  Cardiovascular: Normal rate, regular rhythm and intact distal pulses.   II-III/VI SEM at RUSB  Pulmonary/Chest: Effort normal and breath sounds normal. No respiratory distress. She has no wheezes. She has no rales.  Abdominal: She exhibits no distension. There is no tenderness.  Musculoskeletal: She exhibits no edema.  Neurological: She is alert and oriented to person, place, and time.  Psychiatric:  anxious  Vitals reviewed.         Assessment & Plan:

## 2015-01-22 NOTE — Assessment & Plan Note (Signed)
Ongoing issue for pt.  She has been hospitalized on multiple occasions w/o obvious cause identified.  Levels normalize w/ hydration.  Pt encouraged to increase water intake.  Check labs today.

## 2015-01-23 ENCOUNTER — Other Ambulatory Visit: Payer: Self-pay | Admitting: Family Medicine

## 2015-01-23 DIAGNOSIS — D72829 Elevated white blood cell count, unspecified: Secondary | ICD-10-CM

## 2015-01-23 NOTE — Telephone Encounter (Signed)
Caller name: Marylene Landngela Relation to pt: daughter n law  Call back number:  [phone] :  Reason for call:  Daughter would like to discuss message below

## 2015-01-23 NOTE — Telephone Encounter (Signed)
Spoke with patient who asked that we call pt's daughter tomorrow morning to complete TCM call.  Pt states she's doing okay.  Did not sound to be in distress over the phone.

## 2015-01-24 NOTE — Telephone Encounter (Signed)
Pt was seen on 01/22/15.

## 2015-01-25 ENCOUNTER — Ambulatory Visit: Payer: Commercial Managed Care - HMO | Admitting: Cardiology

## 2015-01-28 ENCOUNTER — Ambulatory Visit: Payer: Commercial Managed Care - HMO | Admitting: Family Medicine

## 2015-02-01 ENCOUNTER — Telehealth: Payer: Self-pay | Admitting: Family Medicine

## 2015-02-01 NOTE — Telephone Encounter (Signed)
Called and left a voicemail inquiring to see if after the additional valium did the patient's BP improve.

## 2015-02-01 NOTE — Telephone Encounter (Signed)
Did BP improve after Valium?

## 2015-02-01 NOTE — Telephone Encounter (Signed)
FYI

## 2015-02-01 NOTE — Telephone Encounter (Signed)
Danielle with [facility] Ph# [phone]  They gave pt extra valium earlier as directed by Dr. Beverely Lowabori from call this morning. Pt BP then was at 170/88.

## 2015-02-07 ENCOUNTER — Other Ambulatory Visit: Payer: Self-pay | Admitting: Neurology

## 2015-02-07 DIAGNOSIS — F0150 Vascular dementia without behavioral disturbance: Secondary | ICD-10-CM

## 2015-02-07 NOTE — Telephone Encounter (Signed)
Last OV: 01/01/15 Next OV: 12/29/15

## 2015-02-11 ENCOUNTER — Telehealth: Payer: Self-pay | Admitting: *Deleted

## 2015-02-11 DIAGNOSIS — S22080D Wedge compression fracture of T11-T12 vertebra, subsequent encounter for fracture with routine healing: Secondary | ICD-10-CM | POA: Diagnosis not present

## 2015-02-11 NOTE — Telephone Encounter (Signed)
Home health certification and [facility] period: 01/19/15-03/19/15. Forwarded to Dr. Beverely Lowabori. JG//CMA

## 2015-02-13 NOTE — Telephone Encounter (Signed)
Signed forms faxed to [facility] successfully. Sent for scanning. JG//CMA 

## 2015-02-22 ENCOUNTER — Ambulatory Visit: Payer: Commercial Managed Care - HMO | Admitting: Family Medicine

## 2015-02-24 ENCOUNTER — Other Ambulatory Visit: Payer: Self-pay | Admitting: Family Medicine

## 2015-02-25 NOTE — Telephone Encounter (Signed)
Medication filled to pharmacy as requested.   

## 2015-03-10 NOTE — Progress Notes (Signed)
Patient ID: Jacqueline [name], female   DOB: [birth year], 81 y.o.   MRN: [id number]    DATE:   11/15/14  Facility:  Nursing Home Location:  [facility] Room Number: 903-2 LEVEL OF CARE:  SNF (31)   Chief Complaint  Patient presents with  . Discharge Note    Physical deconditioning, hyponatremia, hypertension, dementia, CAD, protein calorie malnutrition, hypokalemia, PAF, constipation, osteoporosis, allergic rhinitis and hyperlipidemia    HISTORY OF PRESENT ILLNESS:  This is an 81 year old female who is for discharge home with home health PT, OT and CNA. [facility]. She has PMH of mild dementia, CAD S/P CABG 5, remote PAF on no anticoagulation given her frail habitus and history of dementia. She presented to the ED with anxiety and weakness. She was diagnosed with severely low sodium 121 and was likely due to dehydration. Lexapro was held and gentle hydration with normal saline. Sodium has improved, 133 upon discharge.  Patient was admitted to this facility for short-term rehabilitation after the patient's recent hospitalization.  Patient has completed SNF rehabilitation and therapy has cleared the patient for discharge.  PAST MEDICAL HISTORY:  Past Medical History  Diagnosis Date  . Diverticulitis   . Hypertension   . Osteoporosis   . CAD (coronary artery disease)   . Dementia   . PAF (paroxysmal atrial fibrillation) (HCC)   . HLD (hyperlipidemia)   . GERD (gastroesophageal reflux disease)   . Osteoporosis     CURRENT MEDICATIONS: Reviewed per MAR/see medication list   Medication List       This list is accurate as of: 11/15/14 11:59 PM.  Always use your most recent med list.               alendronate 70 MG tablet  Commonly known as:  FOSAMAX  TAKE 1 TABLET BY MOUTH EVERY 7 DAYS. TAKE WITH A FULL GLASS OF WATER ON AN EMPTY STOMACH     amiodarone 200 MG tablet  Commonly  known as:  PACERONE  TAKE 1 TABLET (200 MG TOTAL) BY MOUTH DAILY.     aspirin EC 325 MG tablet  Take 325 mg by mouth every evening.     atorvastatin 40 MG tablet  Commonly known as:  LIPITOR  TAKE 1 TABLET BY MOUTH DAILY AT 6 PM     desloratadine 5 MG tablet  Commonly known as:  CLARINEX  Take 1 tablet (5 mg total) by mouth daily.     diclofenac sodium 1 % Gel  Commonly known as:  VOLTAREN  Apply 4 g topically 4 (four) times daily.     fluticasone 50 MCG/ACT nasal spray  Commonly known as:  FLONASE  Place 2 sprays into both nostrils daily.     folic acid 1 MG tablet  Commonly known as:  FOLVITE  TAKE 1 TABLET (1 MG TOTAL) BY MOUTH DAILY.     lisinopril 40 MG tablet  Commonly known as:  PRINIVIL,ZESTRIL  Take 1 tablet (40 mg total) by mouth daily.     LUMIGAN 0.01 % Soln  Generic drug:  bimatoprost  PLACE 1 DROP INTO BOTH EYES AT BEDTIME.     memantine 10 MG tablet  Commonly known as:  NAMENDA  Take 1 tablet (10 mg total) by mouth 2 (two) times daily.     metoprolol tartrate 25 MG tablet  Commonly known as:  LOPRESSOR  Take 0.5 tablets (12.5  mg total) by mouth 2 (two) times daily.     multivitamin tablet  Take 0.5 tablets by mouth 2 (two) times daily.     polyethylene glycol packet  Commonly known as:  MIRALAX / GLYCOLAX  Take 17 g by mouth daily.     traMADol 50 MG tablet  Commonly known as:  ULTRAM  Take 50 mg by mouth every 8 (eight) hours as needed for moderate pain.     traMADol 50 MG tablet  Commonly known as:  ULTRAM  Take 1 tablet (50 mg total) by mouth every 8 (eight) hours as needed for moderate pain.        Senna-S 86.6-50 mg tablet       Take 1 tablet by mouth twice a day  No Known Allergies   REVIEW OF SYSTEMS:  GENERAL: no change in appetite, no fatigue, no weight changes, no fever, chills or weakness RESPIRATORY: no cough, SOB, DOE, wheezing, hemoptysis CARDIAC: no chest pain, edema or palpitations GI: no abdominal pain, diarrhea,  constipation, heart burn, nausea or vomiting  PHYSICAL EXAMINATION  GENERAL: no acute distress, normal body habitus EYES: conjunctivae normal, sclerae normal, normal eye lids NECK: supple, trachea midline, no neck masses, no thyroid tenderness, no thyromegaly LYMPHATICS: no LAN in the neck, no supraclavicular LAN RESPIRATORY: breathing is even & unlabored, BS CTAB CARDIAC: RRR, no murmur,no extra heart sounds, no edema GI: abdomen soft, normal BS, no masses, no tenderness, no hepatomegaly, no splenomegaly EXTREMITIES:  able to move 4 extremities  PSYCHIATRIC: the patient is alert & oriented to person, affect & behavior appropriate  LABS/RADIOLOGY: Labs reviewed: 10/26/14  WBC 6.7 hemoglobin 12.3 hematocrit 36.7 MCV 99.2 platelet 237 sodium 136 potassium 4.2 glucose 92 BUN 11 creatinine 0.57 calcium 8.7 Recent Labs  10/20/14 1145      LIPASE 57*     Recent Labs  10/20/14 1145      WBC 6.0      NEUTROABS 4.9      HGB 13.0      HCT 37.7      MCV 96.4      PLT 265         ASSESSMENT/PLAN:  Physical deconditioning - for home health PT, OT and CNA   Hyponatremia - sodium 133; re-check 136, improved  Hypertension  - continue Lopressor 12.5 mg by mouth twice a day and lisinopril 40 mg 1 tab by mouth daily  Dementia - continue Namenda 10 mg by mouth twice a day  CAD - stable; continue aspirin 325 mg by mouth daily  Protein calorie malnutrition - albumin 3.3; continue supplementation  Hypokalemia - K3.0; re-check 4.2, improved  PAF - rate controlled; continue aspirin 325 mg by mouth daily, amiodarone 200 mg 1 tab by mouth daily and Lopressor 12.5 mg by mouth twice a day  Constipation - continue MiraLAX 17 g +4-6 ounces liquid by mouth daily  Osteoporosis - continue Fosamax 70 mg 1 tab by mouth every week  Allergic rhinitis - continue Clarinex 5 mg 1 tab by mouth daily and Flonase 50 g/ACT 2 nasal sprays into nares daily  Hyperlipidemia - continue Lipitor 40 mg 1 tab  by mouth daily      I have filled out patient's discharge paperwork and written prescriptions.  Patient will receive home health PT, OT and CNA.  Total discharge time: Greater than 30 minutes  Discharge time involved coordination of the discharge process with social worker, nursing staff and therapy department. Medical justification for home  health services verified.    [facility], NP [employer] [phone]

## 2015-03-12 NOTE — Progress Notes (Signed)
HPI: Fu CAD. Patient apparently on amiodarone in the past for paroxysmal atrial fibrillation. Also with SVT. Echocardiogram in May 2014 showed normal LV function, mild aortic stenosis with a mean gradient of 11 mm of mercury, and mild LAE. Carotid Dopplers in May of 2014 showed no significant stenosis. Cardiac cath in May of 2014 showed 3 vessel CAD. She subsequently underwent CABG x 5 per Dr. Laneta SimmersBartle with LIMA to LAD, SVG to DX, SVG to 1st and 2nd OM, SVG to RCA. Since she was last seen,   Current Outpatient Prescriptions  Medication Sig Dispense Refill  . alendronate (FOSAMAX) 70 MG tablet TAKE 1 TABLET BY MOUTH EVERY 7 DAYS. TAKE WITH A FULL GLASS OF WATER ON AN EMPTY STOMACH 12 tablet 1  . amiodarone (PACERONE) 200 MG tablet TAKE 1 TABLET BY MOUTH DAILY. 30 tablet 3  . aspirin EC 325 MG tablet Take 325 mg by mouth every evening.     Marland Kitchen. atorvastatin (LIPITOR) 40 MG tablet TAKE 1 TABLET BY MOUTH DAILY AT 6 PM 90 tablet 0  . BESIVANCE 0.6 % SUSP PLACE 1 DROP IN LEFT EYE THREE TIMES A DAY  1  . ciprofloxacin (CIPRO) 500 MG tablet Take 1 tablet (500 mg total) by mouth every 12 (twelve) hours. 10 tablet 0  . desloratadine (CLARINEX) 5 MG tablet Take 1 tablet (5 mg total) by mouth daily. 15 tablet 0  . diazepam (VALIUM) 2 MG tablet 1/2 tab daily and then use other 1/2 as needed for high anxiety moments 30 tablet 3  . DUREZOL 0.05 % EMUL INSTILL 1 DROP IN LEFT EYE THREE TIMES A DAY  1  . feeding supplement, ENSURE ENLIVE, (ENSURE ENLIVE) LIQD Take 237 mLs by mouth 3 (three) times daily between meals. 237 mL 12  . fluticasone (FLONASE) 50 MCG/ACT nasal spray Place 2 sprays into both nostrils daily. 16 g 6  . folic acid (FOLVITE) 1 MG tablet TAKE 1 TABLET (1 MG TOTAL) BY MOUTH DAILY. 90 tablet 1  . ILEVRO 0.3 % ophthalmic suspension INSTILL 1 DROP IN LEFT EYE AT BEDTIME  1  . lisinopril (PRINIVIL,ZESTRIL) 40 MG tablet Take 1 tablet (40 mg total) by mouth daily. 90 tablet 3  . LUMIGAN 0.01 % SOLN  PLACE 1 DROP INTO BOTH EYES AT BEDTIME.  0  . memantine (NAMENDA) 10 MG tablet TAKE 1 TABLET (10 MG TOTAL) BY MOUTH 2 (TWO) TIMES DAILY. 60 tablet 9  . metoprolol tartrate (LOPRESSOR) 25 MG tablet Take 1 tablet (25 mg total) by mouth 2 (two) times daily. 180 tablet 1  . Multiple Vitamin (MULTIVITAMIN) tablet Take 0.5 tablets by mouth 2 (two) times daily.     Marland Kitchen. omeprazole (PRILOSEC) 20 MG capsule Take 1 capsule (20 mg total) by mouth daily. 7 capsule 0  . polyethylene glycol (MIRALAX / GLYCOLAX) packet Take 17 g by mouth daily as needed. 14 each 0   No current facility-administered medications for this visit.   Facility-Administered Medications Ordered in Other Visits  Medication Dose Route Frequency Provider Last Rate Last Dose  . iohexol (OMNIPAQUE) 300 MG/ML solution 100 mL  100 mL Intravenous Once PRN Medication Radiologist, MD         Past Medical History  Diagnosis Date  . Diverticulitis   . Hypertension   . Osteoporosis   . CAD (coronary artery disease)   . Dementia   . PAF (paroxysmal atrial fibrillation) (HCC)   . HLD (hyperlipidemia)   . GERD (gastroesophageal  reflux disease)   . Osteoporosis     Past Surgical History  Procedure Laterality Date  . Fracture surgery    . Partial colectomy    . Coronary artery bypass graft N/A 08/25/2012    Procedure: CORONARY ARTERY BYPASS GRAFTING ;  Surgeon: Alleen Borne, MD;  Location: [facility] OR;  Service: Open Heart Surgery;  Laterality: N/A;  four bypasses total   . Left heart catheterization with coronary angiogram N/A 08/22/2012    Procedure: LEFT HEART CATHETERIZATION WITH CORONARY ANGIOGRAM;  Surgeon: Peter M Swaziland, MD;  Location: [facility] C[facility] LAB;  Service: Cardiovascular;  Laterality: N/A;    Social History   Social History  . Marital Status: Widowed    Spouse Name: N/A  . Number of Children: 2  . Years of Education: 13+   Occupational History  . Retired    Social History Main Topics  . Smoking status: Former Smoker     Types: Cigarettes  . Smokeless tobacco: Never Used  . Alcohol Use: No     Comment: Occasional  . Drug Use: No  . Sexual Activity: No   Other Topics Concern  . Not on file   Social History Narrative   Regular exercise-no   Caffeine Use-yes    ROS: no fevers or chills, productive cough, hemoptysis, dysphasia, odynophagia, melena, hematochezia, dysuria, hematuria, rash, seizure activity, orthopnea, PND, pedal edema, claudication. Remaining systems are negative.  Physical Exam: Well-developed well-nourished in no acute distress.  Skin is warm and dry.  HEENT is normal.  Neck is supple.  Chest is clear to auscultation with normal expansion.  Cardiovascular exam is regular rate and rhythm.  Abdominal exam nontender or distended. No masses palpated. Extremities show no edema. neuro grossly intact  ECG     This encounter was created in error - please disregard.

## 2015-03-13 ENCOUNTER — Encounter: Payer: Self-pay | Admitting: Cardiology

## 2015-03-22 ENCOUNTER — Encounter: Payer: Commercial Managed Care - HMO | Admitting: Cardiology

## 2015-04-11 ENCOUNTER — Telehealth: Payer: Self-pay

## 2015-04-11 NOTE — Telephone Encounter (Signed)
Called daughter back and discussed provider's recommendation.  Names of facilities that daughter could contact were given.  Daughter was appreciative.  No further needs at this time.

## 2015-04-11 NOTE — Telephone Encounter (Signed)
Unfortunately, with 1 week's notice, placement may not be a possibility.  She will need an FL2 completed (which likely requires an office visit since she has not been seen since September).  I am not sure if she has a dx that would qualify her for [facility] social work/care management but without the help of a Child psychotherapistsocial worker, the daughter will need to contact facilities she is interested in to see if they even have an opening

## 2015-04-11 NOTE — Telephone Encounter (Signed)
Patient's daughter is looking for a facility to place her mother in. [place] and want to know if you have any recommendations for in patient care at a facility for her mother also how does she go about getting this set up. Patient is set to establish with [facility] in February will she need an appointment for this?

## 2015-04-12 NOTE — Telephone Encounter (Signed)
Ok for pt to have appt. Changed to a 30 minute spot.

## 2015-04-12 NOTE — Telephone Encounter (Signed)
Daughter-In-Law contacted [facility] for assistance with her issue but states that they can not admit her without an FL2. States that she was informed that since patient had not been seen since Sept she needed an OV. States that facility is also requiring a TB test for patient. She is going to take her to Urgent Care to get TB test done over the weekend. Scheduled appt for patient to see Dr. Beverely Lowabori on Tuesday 04/16/2015.

## 2015-04-14 ENCOUNTER — Other Ambulatory Visit: Payer: Self-pay | Admitting: Family Medicine

## 2015-04-14 DIAGNOSIS — A499 Bacterial infection, unspecified: Secondary | ICD-10-CM

## 2015-04-14 HISTORY — DX: Bacterial infection, unspecified: A49.9

## 2015-04-16 ENCOUNTER — Encounter: Payer: Self-pay | Admitting: Family Medicine

## 2015-04-16 ENCOUNTER — Ambulatory Visit (INDEPENDENT_AMBULATORY_CARE_PROVIDER_SITE_OTHER): Payer: Commercial Managed Care - HMO | Admitting: Family Medicine

## 2015-04-16 ENCOUNTER — Ambulatory Visit: Payer: Commercial Managed Care - HMO | Admitting: Family Medicine

## 2015-04-16 VITALS — BP 136/84 | HR 72 | Temp 98.2°F | Resp 16 | Ht 66.0 in | Wt 107.5 lb

## 2015-04-16 DIAGNOSIS — F0150 Vascular dementia without behavioral disturbance: Secondary | ICD-10-CM | POA: Diagnosis not present

## 2015-04-16 DIAGNOSIS — Z23 Encounter for immunization: Secondary | ICD-10-CM

## 2015-04-16 DIAGNOSIS — F0390 Unspecified dementia without behavioral disturbance: Secondary | ICD-10-CM

## 2015-04-16 DIAGNOSIS — F0391 Unspecified dementia with behavioral disturbance: Secondary | ICD-10-CM

## 2015-04-16 NOTE — Telephone Encounter (Signed)
Medication filled to pharmacy as requested.   

## 2015-04-16 NOTE — Assessment & Plan Note (Signed)
Ongoing issue for pt.  She usually stays w/ her son and daughter-in-law but they are currently in [place] [facility].  Pt needs assistance w/ bathing and medication administration and family does not feel it is safe for her to be home alone.  I agree.  Pt had PPD read earlier today.  FL2 completed and returned to pt and family member.  Flu shot given.

## 2015-04-16 NOTE — Patient Instructions (Signed)
Follow up as needed Call with any questions or concerns Hang in there! Happy New Year!!!

## 2015-04-16 NOTE — Progress Notes (Signed)
Pre visit review using our clinic review tool, if applicable. No additional management support is needed unless otherwise documented below in the visit note. 

## 2015-04-16 NOTE — Progress Notes (Signed)
   Subjective:    Patient ID: Jacqueline [name], female    DOB: Feb 09, 1929, 82 y.o.   MRN: [id number]  HPI FL2- pt is heading to [facility] for respite care while son is having [facility].  Pt has early dementia and needs assistance w/ bathing and her medication administration.  She is oriented to person and place, continent of bowel/bladder.  She is here today for FL2 completion.   Review of Systems For ROS see HPI     Objective:   Physical Exam  Constitutional:  Frail, elderly woman sitting in wheelchair  HENT:  Head: Normocephalic and atraumatic.  Cardiovascular:  Irregularly irregular S1/S2 w/ aortic murmur  Pulmonary/Chest: Effort normal and breath sounds normal. No respiratory distress. She has no wheezes.  Abdominal: Soft. Bowel sounds are normal. She exhibits no distension. There is no tenderness. There is no rebound.  Musculoskeletal: She exhibits no edema.  Skin: Skin is warm and dry.  Psychiatric: She has a normal mood and affect. Her behavior is normal. Thought content normal.  Vitals reviewed.         Assessment & Plan:

## 2015-04-17 ENCOUNTER — Emergency Department (HOSPITAL_COMMUNITY)
Admission: EM | Admit: 2015-04-17 | Discharge: 2015-04-17 | Disposition: A | Discharge status: Home / Self Care | Payer: Commercial Managed Care - HMO | Attending: Emergency Medicine | Admitting: Emergency Medicine

## 2015-04-17 ENCOUNTER — Encounter (HOSPITAL_COMMUNITY): Payer: Self-pay

## 2015-04-17 ENCOUNTER — Emergency Department (HOSPITAL_COMMUNITY): Payer: Commercial Managed Care - HMO

## 2015-04-17 DIAGNOSIS — S0993XA Unspecified injury of face, initial encounter: Secondary | ICD-10-CM | POA: Diagnosis present

## 2015-04-17 DIAGNOSIS — M810 Age-related osteoporosis without current pathological fracture: Secondary | ICD-10-CM | POA: Diagnosis not present

## 2015-04-17 DIAGNOSIS — E785 Hyperlipidemia, unspecified: Secondary | ICD-10-CM | POA: Diagnosis not present

## 2015-04-17 DIAGNOSIS — I10 Essential (primary) hypertension: Secondary | ICD-10-CM | POA: Diagnosis not present

## 2015-04-17 DIAGNOSIS — Z7982 Long term (current) use of aspirin: Secondary | ICD-10-CM | POA: Diagnosis not present

## 2015-04-17 DIAGNOSIS — Y998 Other external cause status: Secondary | ICD-10-CM | POA: Diagnosis not present

## 2015-04-17 DIAGNOSIS — T148XXA Other injury of unspecified body region, initial encounter: Secondary | ICD-10-CM

## 2015-04-17 DIAGNOSIS — K219 Gastro-esophageal reflux disease without esophagitis: Secondary | ICD-10-CM | POA: Insufficient documentation

## 2015-04-17 DIAGNOSIS — Z79899 Other long term (current) drug therapy: Secondary | ICD-10-CM | POA: Insufficient documentation

## 2015-04-17 DIAGNOSIS — Y92002 Bathroom of unspecified non-institutional (private) residence single-family (private) house as the place of occurrence of the external cause: Secondary | ICD-10-CM | POA: Diagnosis not present

## 2015-04-17 DIAGNOSIS — W19XXXA Unspecified fall, initial encounter: Secondary | ICD-10-CM

## 2015-04-17 DIAGNOSIS — W01198A Fall on same level from slipping, tripping and stumbling with subsequent striking against other object, initial encounter: Secondary | ICD-10-CM | POA: Diagnosis not present

## 2015-04-17 DIAGNOSIS — Z23 Encounter for immunization: Secondary | ICD-10-CM | POA: Diagnosis not present

## 2015-04-17 DIAGNOSIS — S60511A Abrasion of right hand, initial encounter: Secondary | ICD-10-CM | POA: Diagnosis not present

## 2015-04-17 DIAGNOSIS — Z9861 Coronary angioplasty status: Secondary | ICD-10-CM | POA: Diagnosis not present

## 2015-04-17 DIAGNOSIS — Z9889 Other specified postprocedural states: Secondary | ICD-10-CM | POA: Insufficient documentation

## 2015-04-17 DIAGNOSIS — S79912A Unspecified injury of left hip, initial encounter: Secondary | ICD-10-CM | POA: Insufficient documentation

## 2015-04-17 DIAGNOSIS — R52 Pain, unspecified: Secondary | ICD-10-CM

## 2015-04-17 DIAGNOSIS — I2510 Atherosclerotic heart disease of native coronary artery without angina pectoris: Secondary | ICD-10-CM | POA: Diagnosis not present

## 2015-04-17 DIAGNOSIS — Z951 Presence of aortocoronary bypass graft: Secondary | ICD-10-CM | POA: Insufficient documentation

## 2015-04-17 DIAGNOSIS — Z87891 Personal history of nicotine dependence: Secondary | ICD-10-CM | POA: Insufficient documentation

## 2015-04-17 DIAGNOSIS — Y9389 Activity, other specified: Secondary | ICD-10-CM | POA: Diagnosis not present

## 2015-04-17 DIAGNOSIS — I480 Paroxysmal atrial fibrillation: Secondary | ICD-10-CM | POA: Diagnosis not present

## 2015-04-17 DIAGNOSIS — S01511A Laceration without foreign body of lip, initial encounter: Secondary | ICD-10-CM | POA: Diagnosis not present

## 2015-04-17 DIAGNOSIS — F0390 Unspecified dementia without behavioral disturbance: Secondary | ICD-10-CM | POA: Diagnosis not present

## 2015-04-17 DIAGNOSIS — Z7951 Long term (current) use of inhaled steroids: Secondary | ICD-10-CM | POA: Diagnosis not present

## 2015-04-17 DIAGNOSIS — Y92009 Unspecified place in unspecified non-institutional (private) residence as the place of occurrence of the external cause: Secondary | ICD-10-CM

## 2015-04-17 LAB — BASIC METABOLIC PANEL
Anion gap: 9 (ref 5–15)
BUN: 18 mg/dL (ref 6–20)
CO2: 27 mmol/L (ref 22–32)
Calcium: 9 mg/dL (ref 8.9–10.3)
Chloride: 103 mmol/L (ref 101–111)
Creatinine, Ser: 0.92 mg/dL (ref 0.44–1.00)
GFR calc Af Amer: >60 mL/min (ref >60)
GFR calc non Af Amer: 56 mL/min — ABNORMAL LOW (ref >60)
Glucose, Bld: 123 mg/dL — ABNORMAL HIGH (ref 65–99)
Potassium: 4 mmol/L (ref 3.5–5.1)
Sodium: 139 mmol/L (ref 135–145)

## 2015-04-17 LAB — CBC WITH DIFFERENTIAL/PLATELET
Basophils Absolute: 0 10*3/uL (ref 0.0–0.1)
Basophils Relative: 0 %
Eosinophils Absolute: 0 10*3/uL (ref 0.0–0.7)
Eosinophils Relative: 0 %
HCT: 36.8 % (ref 36.0–46.0)
Hemoglobin: 11.8 g/dL — ABNORMAL LOW (ref 12.0–15.0)
Lymphocytes Relative: 6 %
Lymphs Abs: 0.5 10*3/uL — ABNORMAL LOW (ref 0.7–4.0)
MCH: 33.1 pg (ref 26.0–34.0)
MCHC: 32.1 g/dL (ref 30.0–36.0)
MCV: 103.1 fL — ABNORMAL HIGH (ref 78.0–100.0)
Monocytes Absolute: 0.5 10*3/uL (ref 0.1–1.0)
Monocytes Relative: 6 %
Neutro Abs: 7.3 10*3/uL (ref 1.7–7.7)
Neutrophils Relative %: 88 %
Platelets: 188 10*3/uL (ref 150–400)
RBC: 3.57 MIL/uL — ABNORMAL LOW (ref 3.87–5.11)
RDW: 15.5 % (ref 11.5–15.5)
WBC: 8.4 10*3/uL (ref 4.0–10.5)

## 2015-04-17 LAB — URINALYSIS, ROUTINE W REFLEX MICROSCOPIC
Bilirubin Urine: NEGATIVE
GLUCOSE, UA: NEGATIVE mg/dL
HGB URINE DIPSTICK: NEGATIVE
KETONES UR: NEGATIVE mg/dL
LEUKOCYTES UA: NEGATIVE
Nitrite: NEGATIVE
PROTEIN: NEGATIVE mg/dL
Specific Gravity, Urine: 1.014 (ref 1.005–1.030)
pH: 6 (ref 5.0–8.0)

## 2015-04-17 LAB — I-STAT TROPONIN, ED: TROPONIN I, POC: 0.02 ng/mL (ref 0.00–0.08)

## 2015-04-17 LAB — I-STAT CHEM 8, ED
BUN: 26 mg/dL — ABNORMAL HIGH (ref 6–20)
Calcium, Ion: 1.17 mmol/L (ref 1.13–1.30)
Chloride: 98 mmol/L — ABNORMAL LOW (ref 101–111)
Creatinine, Ser: 1 mg/dL (ref 0.44–1.00)
Glucose, Bld: 116 mg/dL — ABNORMAL HIGH (ref 65–99)
HCT: 41 % (ref 36.0–46.0)
Hemoglobin: 13.9 g/dL (ref 12.0–15.0)
Potassium: 3.8 mmol/L (ref 3.5–5.1)
Sodium: 139 mmol/L (ref 135–145)
TCO2: 29 mmol/L (ref 0–100)

## 2015-04-17 MED ORDER — TETANUS-DIPHTH-ACELL PERTUSSIS 5-2.5-18.5 LF-MCG/0.5 IM SUSP
0.5000 mL | Freq: Once | INTRAMUSCULAR | Status: AC
  Administered 2015-04-17: 0.5 mL via INTRAMUSCULAR
  Filled 2015-04-17: qty 0.5

## 2015-04-17 MED ORDER — BACITRACIN ZINC 500 UNIT/GM EX OINT
TOPICAL_OINTMENT | Freq: Once | CUTANEOUS | Status: AC
  Administered 2015-04-17: 1 via TOPICAL
  Filled 2015-04-17: qty 0.9

## 2015-04-17 NOTE — Discharge Instructions (Signed)
Wash the affected area with soap and water and apply a thin layer of topical antibiotic ointment. Do this every 12 hours.  ° °Do not use rubbing alcohol or hydrogen peroxide.                       ° °Look for signs of infection: if you see redness, if the area becomes warm, if pain increases sharply, there is discharge (pus), if red streaks appear or you develop fever or vomiting, RETURN immediately to the Emergency Department  for a recheck.  ° °Please follow with your primary care doctor in the next 2 days for a check-up. They must obtain records for further management.  ° °Do not hesitate to return to the Emergency Department for any new, worsening or concerning symptoms.  ° °

## 2015-04-17 NOTE — ED Provider Notes (Signed)
CSN: [id number]     Arrival date & time 04/17/15  1146 History   First MD Initiated Contact with Patient 04/17/15 1153     Chief Complaint  Patient presents with  . Fall     (Consider location/radiation/quality/duration/timing/severity/associated sxs/prior Treatment) HPI   Blood pressure 130/69, pulse 48, temperature 97.6 F (36.4 C), temperature source Oral, resp. rate 20, height 5\' 6"  (1.676 m), weight 48.081 kg, SpO2 98 %.  Jacqueline [name] is a 82 y.o. female brought in by EMS complaining of facial trauma and left hip pain status post fall. Patient states she did not lose consciousness, she cannot explain exactly how she fell. She was going to use the restroom and fell forward with her face hitting the sink. There is no subsequent loss of consciousness, she denies anticoagulation, headache, change in vision, dysarthria, ataxia, cervicalgia, chest pain, shortness of breath, nausea, vomiting. Patient is scheduled to go into [facility] for respite care, she lives with her daughter who is with her son who is getting surgery at [facility], she was accepted to the facility today however she decided to go tomorrow.  PCP Beverely Low Cards: Jens Som  Past Medical History  Diagnosis Date  . Diverticulitis   . Hypertension   . Osteoporosis   . CAD (coronary artery disease)   . Dementia   . PAF (paroxysmal atrial fibrillation) (HCC)   . HLD (hyperlipidemia)   . GERD (gastroesophageal reflux disease)   . Osteoporosis    Past Surgical History  Procedure Laterality Date  . Fracture surgery    . Partial colectomy    . Coronary artery bypass graft N/A 08/25/2012    Procedure: CORONARY ARTERY BYPASS GRAFTING ;  Surgeon: Alleen Borne, MD;  Location: [facility] OR;  Service: Open Heart Surgery;  Laterality: N/A;  four bypasses total   . Left heart catheterization with coronary angiogram N/A 08/22/2012    Procedure: LEFT HEART CATHETERIZATION WITH CORONARY ANGIOGRAM;  Surgeon: Peter M Swaziland, MD;   Location: [facility] CATH LAB;  Service: Cardiovascular;  Laterality: N/A;   Family History  Problem Relation Age of Onset  . Hypertension Other   . Cancer Mother     stomach  . Diabetes Neg Hx   . Heart disease Neg Hx   . Stroke Neg Hx    Social History  Substance Use Topics  . Smoking status: Former Smoker    Types: Cigarettes  . Smokeless tobacco: Never Used  . Alcohol Use: No     Comment: Occasional   OB History    No data available     Review of Systems  10 systems reviewed and found to be negative, except as noted in the HPI.   Allergies  Review of patient's allergies indicates no known allergies.  Home Medications   Prior to Admission medications   Medication Sig Start Date End Date Taking? Authorizing Provider  alendronate (FOSAMAX) 70 MG tablet TAKE 1 TABLET BY MOUTH EVERY 7 DAYS. TAKE WITH A FULL GLASS OF WATER ON AN EMPTY STOMACH 02/25/15   Sheliah Hatch, MD  amiodarone (PACERONE) 200 MG tablet TAKE 1 TABLET BY MOUTH DAILY. 01/02/15   Sheliah Hatch, MD  aspirin EC 325 MG tablet Take 325 mg by mouth every evening.     Historical Provider, MD  atorvastatin (LIPITOR) 40 MG tablet TAKE 1 TABLET BY MOUTH DAILY AT 6 PM 01/09/15   Sheliah Hatch, MD  BESIVANCE 0.6 % SUSP PLACE 1 DROP IN LEFT EYE THREE TIMES  A DAY 01/11/15   Historical Provider, MD  desloratadine (CLARINEX) 5 MG tablet Take 1 tablet (5 mg total) by mouth daily. 08/10/13   Gilda Creasehristopher J Pollina, MD  diazepam (VALIUM) 2 MG tablet 1/2 tab daily and then use other 1/2 as needed for high anxiety moments 01/22/15   Sheliah HatchKatherine E Tabori, MD  DUREZOL 0.05 % EMUL INSTILL 1 DROP IN LEFT EYE THREE TIMES A DAY 01/11/15   Historical Provider, MD  feeding supplement, ENSURE ENLIVE, (ENSURE ENLIVE) LIQD Take 237 mLs by mouth 3 (three) times daily between meals. 01/17/15   Calvert CantorSaima Rizwan, MD  fluticasone (FLONASE) 50 MCG/ACT nasal spray Place 2 sprays into both nostrils daily. 10/05/13   Sheliah HatchKatherine E Tabori, MD  folic acid  (FOLVITE) 1 MG tablet TAKE 1 TABLET BY MOUTH EVERY DAY 04/16/15   Sheliah HatchKatherine E Tabori, MD  ILEVRO 0.3 % ophthalmic suspension INSTILL 1 DROP IN LEFT EYE AT BEDTIME 01/11/15   Historical Provider, MD  lisinopril (PRINIVIL,ZESTRIL) 40 MG tablet Take 1 tablet (40 mg total) by mouth daily. 09/11/14   Lewayne BuntingBrian S Crenshaw, MD  LUMIGAN 0.01 % SOLN PLACE 1 DROP INTO BOTH EYES AT BEDTIME. 08/29/14   Historical Provider, MD  memantine (NAMENDA) 10 MG tablet TAKE 1 TABLET (10 MG TOTAL) BY MOUTH 2 (TWO) TIMES DAILY. 02/07/15   Drema DallasAdam R Jaffe, DO  metoprolol tartrate (LOPRESSOR) 25 MG tablet Take 1 tablet (25 mg total) by mouth 2 (two) times daily. 11/30/14   Sheliah HatchKatherine E Tabori, MD  Multiple Vitamin (MULTIVITAMIN) tablet Take 0.5 tablets by mouth 2 (two) times daily.     Historical Provider, MD  omeprazole (PRILOSEC) 20 MG capsule Take 1 capsule (20 mg total) by mouth daily. 01/12/15   Benjiman CoreNathan Pickering, MD  polyethylene glycol [facility](MIRALAX / GLYCOLAX) packet Take 17 g by mouth daily as needed. 01/17/15   Calvert CantorSaima Rizwan, MD   BP 130/69 mmHg  Pulse 48  Temp(Src) 97.6 F (36.4 C) (Oral)  Resp 20  Ht 5\' 6"  (1.676 m)  Wt 48.081 kg  BMI 17.12 kg/m2  SpO2 98% Physical Exam  Constitutional: She is oriented to person, place, and time. She appears well-developed and well-nourished. No distress.  Thin and frail  HENT:  Head: Normocephalic.  Mouth/Throat: Oropharynx is clear and moist.  Half centimeter full-thickness laceration to upper lip mucosal surface, does not cross the vermilion border, non-gaping. No malocclusion, no loose teeth  Patient has contusion to left scalp, multiple partial thickness abrasions to head, no tenderness to palpation along the orbital rim, extraocular movement is intact without pain or diplopia.  Pupils equal round reactive to light.  Eyes: Conjunctivae and EOM are normal.  Cardiovascular: Normal rate, regular rhythm and intact distal pulses.   Pulmonary/Chest: Effort normal and breath sounds normal.  No stridor. No respiratory distress. She has no wheezes. She has no rales. She exhibits no tenderness.  Abdominal: Soft. Bowel sounds are normal. She exhibits no distension and no mass. There is no tenderness. There is no rebound and no guarding.  Musculoskeletal: Normal range of motion. She exhibits no edema or tenderness.  no snuffbox tenderness bilaterally, strength is 5 out of 54 extremities, patient with no focal tenderness palpation along the left trochanter, left leg is not shortened or rotated.  Neurological: She is alert and oriented to person, place, and time.  Skin:  Abrasions to right hand  Psychiatric: She has a normal mood and affect.  Nursing note and vitals reviewed.   ED Course  Procedures (including  critical care time) Labs Review Labs Reviewed - No data to display  Imaging Review No results found. I have personally reviewed and evaluated these images and lab results as part of my medical decision-making.   EKG Interpretation None      MDM   Final diagnoses:  Pain  Fall at home, initial encounter  Abrasion    Filed Vitals:   04/17/15 1436 04/17/15 1530 04/17/15 1601 04/17/15 1614  BP: 101/84 127/67 162/100 172/83  Pulse: 49 51 52 50  Temp:      TempSrc:      Resp: 17 22 16 15   Height:      Weight:      SpO2: 96% 98% 95% 98%    Medications  Tdap (BOOSTRIX) injection 0.5 mL (0.5 mLs Intramuscular Given 04/17/15 1435)  bacitracin ointment (1 application Topical Given 04/17/15 1622)    Jacqueline [name] is 82 y.o. female presenting with facial trauma and fall. Neuro exam is nonfocal, no signs of orbital entrapment. Head, maxillofacial and cervical CT are negative. X-rays of the chest, bilateral shoulders and hip are also negative. Patient is ambulatory but with no pain in the hip. As per her caregiver, she is walking at her baseline. Patient is going into a short rehabilitation facility while her son is in surgery. Caregivers at bedside will coordinate  the transfer.  Evaluation does not show pathology that would require ongoing emergent intervention or inpatient treatment. Pt is hemodynamically stable and mentating appropriately. Discussed findings and plan with patient/guardian, who agrees with care plan. All questions answered. Return precautions discussed and outpatient follow up given.   This is a shared visit with the attending physician who personally evaluated the patient and agrees with the care plan.        Wynetta Emery, PA-C 04/17/15 1656  Leta Baptist, MD 04/20/15 [phone]

## 2015-04-17 NOTE — ED Notes (Signed)
Pt able to ambulate in the hall with 2 staff assist.

## 2015-04-17 NOTE — ED Notes (Signed)
[facility]- Pt here after an unwittnessed fall in the bathroom at home. Pt has laceration to inside of top lip and swelling to left side of the face. Pt is alert and oriented X4 with hx of mild dementia. Sinus brady on the monitor, hx of CABG. C-collar in place, pt denies neck/back pain.

## 2015-04-19 ENCOUNTER — Telehealth: Payer: Self-pay | Admitting: Family Medicine

## 2015-04-19 NOTE — Telephone Encounter (Signed)
Caller name: Melissa - [facility]   Relationship to patient: Nurse   Can be reached: [phone]    Reason for call: She says that pt fell yesterday and is black and blue. She says that pt was seen at [facility].  She says that pt is now complaining of body soreness due to her fall. She would like to have something prescribed to pt like Tylenol for her pain. She says that the hospital didn't prescribe anything. She says a Rx can be faxed to: 336. [phone]

## 2015-04-22 ENCOUNTER — Telehealth: Payer: Self-pay | Admitting: *Deleted

## 2015-04-22 ENCOUNTER — Encounter: Payer: Commercial Managed Care - HMO | Admitting: Cardiology

## 2015-04-22 NOTE — Progress Notes (Signed)
HPI: Fu CAD. Patient apparently on amiodarone in the past for paroxysmal atrial fibrillation. Also with SVT. Echocardiogram in May 2014 showed normal LV function, mild aortic stenosis with a mean gradient of 11 mm of mercury, and mild LAE. Carotid Dopplers in May of 2014 showed no significant stenosis. Cardiac cath in May of 2014 showed 3 vessel CAD. She subsequently underwent CABG x 5 per Dr. Laneta SimmersBartle with LIMA to LAD, SVG to DX, SVG to 1st and 2nd OM, SVG to RCA. Since she was last seen She denies dyspnea, chest pain or syncope. She fell recently in her bathroom. She had trauma and was seen in the emergency room. X-rays negative. She has significant pain in her ribs from fall.  Current Outpatient Prescriptions  Medication Sig Dispense Refill  . alendronate (FOSAMAX) 70 MG tablet TAKE 1 TABLET BY MOUTH EVERY 7 DAYS. TAKE WITH A FULL GLASS OF WATER ON AN EMPTY STOMACH 12 tablet 1  . amiodarone (PACERONE) 200 MG tablet TAKE 1 TABLET BY MOUTH DAILY. 30 tablet 3  . aspirin EC 325 MG tablet Take 325 mg by mouth every evening.     Marland Kitchen. atorvastatin (LIPITOR) 40 MG tablet TAKE 1 TABLET BY MOUTH DAILY AT 6 PM 90 tablet 0  . BESIVANCE 0.6 % SUSP PLACE 1 DROP IN RIGHT EYE ONE TIME  A DAY  1  . diazepam (VALIUM) 2 MG tablet 1/2 tab daily and then use other 1/2 as needed for high anxiety moments 30 tablet 3  . feeding supplement, ENSURE ENLIVE, (ENSURE ENLIVE) LIQD Take 237 mLs by mouth 3 (three) times daily between meals. 237 mL 12  . fluticasone (FLONASE) 50 MCG/ACT nasal spray Place 2 sprays into both nostrils daily. 16 g 6  . folic acid (FOLVITE) 1 MG tablet TAKE 1 TABLET BY MOUTH EVERY DAY 90 tablet 1  . ILEVRO 0.3 % ophthalmic suspension INSTILL 1 DROP IN RIGHT EYE AT AFTERNOON  1  . lisinopril (PRINIVIL,ZESTRIL) 40 MG tablet Take 1 tablet (40 mg total) by mouth daily. 90 tablet 3  . LUMIGAN 0.01 % SOLN PLACE 1 DROP INTO BOTH EYES AT BEDTIME.  0  . memantine (NAMENDA) 10 MG tablet TAKE 1 TABLET (10  MG TOTAL) BY MOUTH 2 (TWO) TIMES DAILY. 60 tablet 9  . metoprolol tartrate (LOPRESSOR) 25 MG tablet Take 1 tablet (25 mg total) by mouth 2 (two) times daily. 180 tablet 1  . Multiple Vitamin (MULTIVITAMIN) tablet Take 0.5 tablets by mouth 2 (two) times daily.     Marland Kitchen. omeprazole (PRILOSEC) 20 MG capsule Take 1 capsule (20 mg total) by mouth daily. 7 capsule 0  . polyethylene glycol (MIRALAX / GLYCOLAX) packet Take 17 g by mouth daily as needed. 14 each 0   No current facility-administered medications for this visit.   Facility-Administered Medications Ordered in Other Visits  Medication Dose Route Frequency Provider Last Rate Last Dose  . iohexol (OMNIPAQUE) 300 MG/ML solution 100 mL  100 mL Intravenous Once PRN Medication Radiologist, MD         Past Medical History  Diagnosis Date  . Diverticulitis   . Hypertension   . Osteoporosis   . CAD (coronary artery disease)   . Dementia   . PAF (paroxysmal atrial fibrillation) (HCC)   . HLD (hyperlipidemia)   . GERD (gastroesophageal reflux disease)   . Osteoporosis     Past Surgical History  Procedure Laterality Date  . Fracture surgery    . Partial colectomy    .  Coronary artery bypass graft N/A 08/25/2012    Procedure: CORONARY ARTERY BYPASS GRAFTING ;  Surgeon: Alleen Borne, MD;  Location: [facility] OR;  Service: Open Heart Surgery;  Laterality: N/A;  four bypasses total   . Left heart catheterization with coronary angiogram N/A 08/22/2012    Procedure: LEFT HEART CATHETERIZATION WITH CORONARY ANGIOGRAM;  Surgeon: Peter M Swaziland, MD;  Location: [facility] CATH LAB;  Service: Cardiovascular;  Laterality: N/A;    Social History   Social History  . Marital Status: Widowed    Spouse Name: N/A  . Number of Children: 2  . Years of Education: 13+   Occupational History  . Retired    Social History Main Topics  . Smoking status: Former Smoker    Types: Cigarettes  . Smokeless tobacco: Never Used  . Alcohol Use: No     Comment: Occasional  .  Drug Use: No  . Sexual Activity: No   Other Topics Concern  . Not on file   Social History Narrative   Regular exercise-no   Caffeine Use-yes    Family History  Problem Relation Age of Onset  . Hypertension Other   . Cancer Mother     stomach  . Diabetes Neg Hx   . Heart disease Neg Hx   . Stroke Neg Hx     ROS: no fevers or chills, productive cough, hemoptysis, dysphasia, odynophagia, melena, hematochezia, dysuria, hematuria, rash, seizure activity, orthopnea, PND, pedal edema, claudication. Remaining systems are negative.  Physical Exam: Well-developed frail in no acute distress.  Skin is warm and dry.  HEENT With multiple ecchymoses from recent fall. Neck is supple.  Chest is clear to auscultation with normal expansion. Tender over ribs bilaterally. Cardiovascular exam is regular rate and rhythm. 3/6 systolic murmur at sternal border. Abdominal exam nontender or distended. No masses palpated. Extremities show no edema. neuro grossly intact  ECG 04/17/2015-Sinus bradycardia, first-degree AV block, nonspecific ST changes.

## 2015-04-22 NOTE — Progress Notes (Signed)
HPI: Fu CAD. Patient apparently on amiodarone in the past for paroxysmal atrial fibrillation. Also with SVT. Echocardiogram in May 2014 showed normal LV function, mild aortic stenosis with a mean gradient of 11 mm of mercury, and mild LAE. Carotid Dopplers in May of 2014 showed no significant stenosis. Cardiac cath in May of 2014 showed 3 vessel CAD. She subsequently underwent CABG x 5 per Dr. Laneta SimmersBartle with LIMA to LAD, SVG to DX, SVG to 1st and 2nd OM, SVG to RCA. Since she was last seen   Current Outpatient Prescriptions  Medication Sig Dispense Refill  . alendronate (FOSAMAX) 70 MG tablet TAKE 1 TABLET BY MOUTH EVERY 7 DAYS. TAKE WITH A FULL GLASS OF WATER ON AN EMPTY STOMACH 12 tablet 1  . amiodarone (PACERONE) 200 MG tablet TAKE 1 TABLET BY MOUTH DAILY. 30 tablet 3  . aspirin EC 325 MG tablet Take 325 mg by mouth every evening.     Marland Kitchen. atorvastatin (LIPITOR) 40 MG tablet TAKE 1 TABLET BY MOUTH DAILY AT 6 PM 90 tablet 0  . BESIVANCE 0.6 % SUSP PLACE 1 DROP IN RIGHT EYE ONE TIME  A DAY  1  . desloratadine (CLARINEX) 5 MG tablet Take 1 tablet (5 mg total) by mouth daily. (Patient not taking: Reported on 04/17/2015) 15 tablet 0  . diazepam (VALIUM) 2 MG tablet 1/2 tab daily and then use other 1/2 as needed for high anxiety moments (Patient not taking: Reported on 04/17/2015) 30 tablet 3  . feeding supplement, ENSURE ENLIVE, (ENSURE ENLIVE) LIQD Take 237 mLs by mouth 3 (three) times daily between meals. (Patient not taking: Reported on 04/17/2015) 237 mL 12  . fluticasone (FLONASE) 50 MCG/ACT nasal spray Place 2 sprays into both nostrils daily. (Patient not taking: Reported on 04/17/2015) 16 g 6  . folic acid (FOLVITE) 1 MG tablet TAKE 1 TABLET BY MOUTH EVERY DAY 90 tablet 1  . ILEVRO 0.3 % ophthalmic suspension INSTILL 1 DROP IN RIGHT EYE AT AFTERNOON  1  . lisinopril (PRINIVIL,ZESTRIL) 40 MG tablet Take 1 tablet (40 mg total) by mouth daily. 90 tablet 3  . LUMIGAN 0.01 % SOLN PLACE 1 DROP INTO BOTH  EYES AT BEDTIME.  0  . memantine (NAMENDA) 10 MG tablet TAKE 1 TABLET (10 MG TOTAL) BY MOUTH 2 (TWO) TIMES DAILY. 60 tablet 9  . metoprolol tartrate (LOPRESSOR) 25 MG tablet Take 1 tablet (25 mg total) by mouth 2 (two) times daily. 180 tablet 1  . Multiple Vitamin (MULTIVITAMIN) tablet Take 0.5 tablets by mouth 2 (two) times daily.     Marland Kitchen. omeprazole (PRILOSEC) 20 MG capsule Take 1 capsule (20 mg total) by mouth daily. (Patient not taking: Reported on 04/17/2015) 7 capsule 0  . polyethylene glycol (MIRALAX / GLYCOLAX) packet Take 17 g by mouth daily as needed. 14 each 0   No current facility-administered medications for this visit.   Facility-Administered Medications Ordered in Other Visits  Medication Dose Route Frequency Provider Last Rate Last Dose  . iohexol (OMNIPAQUE) 300 MG/ML solution 100 mL  100 mL Intravenous Once PRN Medication Radiologist, MD         Past Medical History  Diagnosis Date  . Diverticulitis   . Hypertension   . Osteoporosis   . CAD (coronary artery disease)   . Dementia   . PAF (paroxysmal atrial fibrillation) (HCC)   . HLD (hyperlipidemia)   . GERD (gastroesophageal reflux disease)   . Osteoporosis     Past  Surgical History  Procedure Laterality Date  . Fracture surgery    . Partial colectomy    . Coronary artery bypass graft N/A 08/25/2012    Procedure: CORONARY ARTERY BYPASS GRAFTING ;  Surgeon: Alleen Borne, MD;  Location: [facility] OR;  Service: Open Heart Surgery;  Laterality: N/A;  four bypasses total   . Left heart catheterization with coronary angiogram N/A 08/22/2012    Procedure: LEFT HEART CATHETERIZATION WITH CORONARY ANGIOGRAM;  Surgeon: Peter M Swaziland, MD;  Location: [facility] CATH LAB;  Service: Cardiovascular;  Laterality: N/A;    Social History   Social History  . Marital Status: Widowed    Spouse Name: N/A  . Number of Children: 2  . Years of Education: 13+   Occupational History  . Retired    Social History Main Topics  . Smoking status:  Former Smoker    Types: Cigarettes  . Smokeless tobacco: Never Used  . Alcohol Use: No     Comment: Occasional  . Drug Use: No  . Sexual Activity: No   Other Topics Concern  . Not on file   Social History Narrative   Regular exercise-no   Caffeine Use-yes    ROS: no fevers or chills, productive cough, hemoptysis, dysphasia, odynophagia, melena, hematochezia, dysuria, hematuria, rash, seizure activity, orthopnea, PND, pedal edema, claudication. Remaining systems are negative.  Physical Exam: Well-developed well-nourished in no acute distress.  Skin is warm and dry.  HEENT is normal.  Neck is supple.  Chest is clear to auscultation with normal expansion.  Cardiovascular exam is regular rate and rhythm.  Abdominal exam nontender or distended. No masses palpated. Extremities show no edema. neuro grossly intact  ECG     This encounter was created in error - please disregard.

## 2015-04-22 NOTE — Telephone Encounter (Signed)
Received fax from [facility] for physician order on PT and OT; forwarded to provider for sign/SLS

## 2015-04-23 ENCOUNTER — Ambulatory Visit (INDEPENDENT_AMBULATORY_CARE_PROVIDER_SITE_OTHER): Payer: Commercial Managed Care - HMO | Admitting: Cardiology

## 2015-04-23 ENCOUNTER — Encounter: Payer: Self-pay | Admitting: Cardiology

## 2015-04-23 VITALS — BP 162/82 | HR 56

## 2015-04-23 DIAGNOSIS — W19XXXA Unspecified fall, initial encounter: Secondary | ICD-10-CM | POA: Insufficient documentation

## 2015-04-23 DIAGNOSIS — I350 Nonrheumatic aortic (valve) stenosis: Secondary | ICD-10-CM

## 2015-04-23 DIAGNOSIS — I2510 Atherosclerotic heart disease of native coronary artery without angina pectoris: Secondary | ICD-10-CM | POA: Diagnosis not present

## 2015-04-23 DIAGNOSIS — E785 Hyperlipidemia, unspecified: Secondary | ICD-10-CM

## 2015-04-23 DIAGNOSIS — I10 Essential (primary) hypertension: Secondary | ICD-10-CM

## 2015-04-23 DIAGNOSIS — W19XXXD Unspecified fall, subsequent encounter: Secondary | ICD-10-CM

## 2015-04-23 DIAGNOSIS — I2583 Coronary atherosclerosis due to lipid rich plaque: Secondary | ICD-10-CM

## 2015-04-23 DIAGNOSIS — I480 Paroxysmal atrial fibrillation: Secondary | ICD-10-CM | POA: Diagnosis not present

## 2015-04-23 MED ORDER — AMLODIPINE BESYLATE 5 MG PO TABS: 5.0000 mg | ORAL_TABLET | Freq: Every day | ORAL | Status: DC

## 2015-04-23 NOTE — Telephone Encounter (Signed)
Please advise on note below.   

## 2015-04-23 NOTE — Assessment & Plan Note (Signed)
Plan repeat echocardiogram. 

## 2015-04-23 NOTE — Assessment & Plan Note (Addendum)
Blood pressure Elevated and heart rate decreased. Discontinue metoprolol.Add amlodipine 5 mg daily. Increase as needed for blood pressure control.

## 2015-04-23 NOTE — Assessment & Plan Note (Signed)
Patient remains in sinus rhythm. Continue amiodarone. Not a candidate for anticoagulation given her mild dementia and falls. She has had recent chest x-ray, TSH and liver functions.

## 2015-04-23 NOTE — Telephone Encounter (Signed)
Called Nursing Home and faxed copy of order per their request.

## 2015-04-23 NOTE — Assessment & Plan Note (Signed)
Continue aspirin and statin. 

## 2015-04-23 NOTE — Assessment & Plan Note (Signed)
Continue statin. 

## 2015-04-23 NOTE — Telephone Encounter (Signed)
Ok for tylenol 325mg  2 tabs every 6 hrs as needed for pain

## 2015-04-23 NOTE — Patient Instructions (Signed)
Medication Instructions:   STOP METOPROLOL  START AMLODIPINE 5 MG ONCE DAILY  Testing/Procedures:  Your physician has requested that you have an echocardiogram. Echocardiography is a painless test that uses sound waves to create images of your heart. It provides your doctor with information about the size and shape of your heart and how well your heart's chambers and valves are working. This procedure takes approximately one hour. There are no restrictions for this procedure.    Follow-Up:  Your physician wants you to follow-up in: ONE YEAR WITH DR Denny PeonRENSHAWW You will receive a reminder letter in the mail two months in advance. If you don't receive a letter, please call our office to schedule the follow-up appointment.   If you need a refill on your cardiac medications before your next appointment, please call your pharmacy.

## 2015-04-23 NOTE — Assessment & Plan Note (Signed)
Recent fall and trauma. Seen in the emergency room and x-rays negative. Significant residual pain. I will ask primary care to evaluate.

## 2015-04-25 ENCOUNTER — Inpatient Hospital Stay (HOSPITAL_BASED_OUTPATIENT_CLINIC_OR_DEPARTMENT_OTHER)
Admission: EM | Admit: 2015-04-25 | Discharge: 2015-04-30 | DRG: 195 | Disposition: A | Discharge status: Skilled Nursing Facility | Payer: Commercial Managed Care - HMO | Attending: Internal Medicine | Admitting: Internal Medicine

## 2015-04-25 ENCOUNTER — Emergency Department (HOSPITAL_BASED_OUTPATIENT_CLINIC_OR_DEPARTMENT_OTHER): Payer: Commercial Managed Care - HMO

## 2015-04-25 ENCOUNTER — Encounter (HOSPITAL_BASED_OUTPATIENT_CLINIC_OR_DEPARTMENT_OTHER): Payer: Self-pay | Admitting: *Deleted

## 2015-04-25 ENCOUNTER — Ambulatory Visit (INDEPENDENT_AMBULATORY_CARE_PROVIDER_SITE_OTHER): Payer: Commercial Managed Care - HMO | Admitting: Family Medicine

## 2015-04-25 ENCOUNTER — Encounter: Payer: Self-pay | Admitting: Family Medicine

## 2015-04-25 VITALS — BP 135/73 | HR 72 | Temp 98.2°F | Ht 66.0 in

## 2015-04-25 DIAGNOSIS — Z5189 Encounter for other specified aftercare: Secondary | ICD-10-CM | POA: Diagnosis not present

## 2015-04-25 DIAGNOSIS — J189 Pneumonia, unspecified organism: Principal | ICD-10-CM | POA: Diagnosis present

## 2015-04-25 DIAGNOSIS — J069 Acute upper respiratory infection, unspecified: Secondary | ICD-10-CM

## 2015-04-25 DIAGNOSIS — W19XXXD Unspecified fall, subsequent encounter: Secondary | ICD-10-CM | POA: Diagnosis not present

## 2015-04-25 DIAGNOSIS — S0083XA Contusion of other part of head, initial encounter: Secondary | ICD-10-CM | POA: Diagnosis present

## 2015-04-25 DIAGNOSIS — S40021A Contusion of right upper arm, initial encounter: Secondary | ICD-10-CM | POA: Diagnosis present

## 2015-04-25 DIAGNOSIS — K219 Gastro-esophageal reflux disease without esophagitis: Secondary | ICD-10-CM | POA: Diagnosis present

## 2015-04-25 DIAGNOSIS — M545 Low back pain: Secondary | ICD-10-CM | POA: Diagnosis not present

## 2015-04-25 DIAGNOSIS — Z951 Presence of aortocoronary bypass graft: Secondary | ICD-10-CM

## 2015-04-25 DIAGNOSIS — J181 Lobar pneumonia, unspecified organism: Secondary | ICD-10-CM

## 2015-04-25 DIAGNOSIS — Z7982 Long term (current) use of aspirin: Secondary | ICD-10-CM

## 2015-04-25 DIAGNOSIS — S40022A Contusion of left upper arm, initial encounter: Secondary | ICD-10-CM | POA: Diagnosis present

## 2015-04-25 DIAGNOSIS — Z87891 Personal history of nicotine dependence: Secondary | ICD-10-CM

## 2015-04-25 DIAGNOSIS — F0390 Unspecified dementia without behavioral disturbance: Secondary | ICD-10-CM | POA: Diagnosis present

## 2015-04-25 DIAGNOSIS — I2510 Atherosclerotic heart disease of native coronary artery without angina pectoris: Secondary | ICD-10-CM | POA: Diagnosis present

## 2015-04-25 DIAGNOSIS — Z7951 Long term (current) use of inhaled steroids: Secondary | ICD-10-CM

## 2015-04-25 DIAGNOSIS — E785 Hyperlipidemia, unspecified: Secondary | ICD-10-CM | POA: Diagnosis present

## 2015-04-25 DIAGNOSIS — I480 Paroxysmal atrial fibrillation: Secondary | ICD-10-CM | POA: Diagnosis present

## 2015-04-25 DIAGNOSIS — I10 Essential (primary) hypertension: Secondary | ICD-10-CM | POA: Diagnosis present

## 2015-04-25 DIAGNOSIS — W010XXA Fall on same level from slipping, tripping and stumbling without subsequent striking against object, initial encounter: Secondary | ICD-10-CM | POA: Diagnosis present

## 2015-04-25 DIAGNOSIS — Z66 Do not resuscitate: Secondary | ICD-10-CM | POA: Diagnosis present

## 2015-04-25 DIAGNOSIS — Z9181 History of falling: Secondary | ICD-10-CM

## 2015-04-25 DIAGNOSIS — K5900 Constipation, unspecified: Secondary | ICD-10-CM | POA: Diagnosis present

## 2015-04-25 DIAGNOSIS — R1310 Dysphagia, unspecified: Secondary | ICD-10-CM | POA: Diagnosis present

## 2015-04-25 DIAGNOSIS — M810 Age-related osteoporosis without current pathological fracture: Secondary | ICD-10-CM | POA: Diagnosis present

## 2015-04-25 DIAGNOSIS — W19XXXA Unspecified fall, initial encounter: Secondary | ICD-10-CM | POA: Diagnosis not present

## 2015-04-25 DIAGNOSIS — S1093XA Contusion of unspecified part of neck, initial encounter: Secondary | ICD-10-CM | POA: Diagnosis present

## 2015-04-25 DIAGNOSIS — S0003XA Contusion of scalp, initial encounter: Secondary | ICD-10-CM | POA: Diagnosis present

## 2015-04-25 LAB — CBC WITH DIFFERENTIAL/PLATELET
BASOS ABS: 0 10*3/uL (ref 0.0–0.1)
Basophils Relative: 0 %
EOS ABS: 0 10*3/uL (ref 0.0–0.7)
EOS PCT: 0 %
HCT: 37 % (ref 36.0–46.0)
Hemoglobin: 11.8 g/dL — ABNORMAL LOW (ref 12.0–15.0)
Lymphocytes Relative: 6 %
Lymphs Abs: 0.6 10*3/uL — ABNORMAL LOW (ref 0.7–4.0)
MCH: 33 pg (ref 26.0–34.0)
MCHC: 31.9 g/dL (ref 30.0–36.0)
MCV: 103.4 fL — AB (ref 78.0–100.0)
MONO ABS: 0.6 10*3/uL (ref 0.1–1.0)
Monocytes Relative: 6 %
Neutro Abs: 8.3 10*3/uL — ABNORMAL HIGH (ref 1.7–7.7)
Neutrophils Relative %: 88 %
PLATELETS: 230 10*3/uL (ref 150–400)
RBC: 3.58 MIL/uL — AB (ref 3.87–5.11)
RDW: 14.9 % (ref 11.5–15.5)
WBC: 9.5 10*3/uL (ref 4.0–10.5)

## 2015-04-25 LAB — COMPREHENSIVE METABOLIC PANEL
ALT: 143 U/L — ABNORMAL HIGH (ref 14–54)
AST: 112 U/L — ABNORMAL HIGH (ref 15–41)
Albumin: 2.7 g/dL — ABNORMAL LOW (ref 3.5–5.0)
Alkaline Phosphatase: 73 U/L (ref 38–126)
Anion gap: 6 (ref 5–15)
BILIRUBIN TOTAL: 0.6 mg/dL (ref 0.3–1.2)
BUN: 28 mg/dL — AB (ref 6–20)
CHLORIDE: 108 mmol/L (ref 101–111)
CO2: 28 mmol/L (ref 22–32)
CREATININE: 0.64 mg/dL (ref 0.44–1.00)
Calcium: 8.8 mg/dL — ABNORMAL LOW (ref 8.9–10.3)
Glucose, Bld: 149 mg/dL — ABNORMAL HIGH (ref 65–99)
POTASSIUM: 3.2 mmol/L — AB (ref 3.5–5.1)
Sodium: 142 mmol/L (ref 135–145)
TOTAL PROTEIN: 6.3 g/dL — AB (ref 6.5–8.1)

## 2015-04-25 LAB — URINALYSIS, ROUTINE W REFLEX MICROSCOPIC
Glucose, UA: NEGATIVE mg/dL
Hgb urine dipstick: NEGATIVE
KETONES UR: NEGATIVE mg/dL
LEUKOCYTES UA: NEGATIVE
NITRITE: NEGATIVE
PH: 6 (ref 5.0–8.0)
PROTEIN: 30 mg/dL — AB
Specific Gravity, Urine: 1.021 (ref 1.005–1.030)

## 2015-04-25 LAB — URINE MICROSCOPIC-ADD ON: WBC UA: NONE SEEN WBC/hpf (ref 0–5)

## 2015-04-25 LAB — TROPONIN I

## 2015-04-25 LAB — LIPASE, BLOOD: Lipase: 53 U/L — ABNORMAL HIGH (ref 11–51)

## 2015-04-25 LAB — CBG MONITORING, ED: GLUCOSE-CAPILLARY: 140 mg/dL — AB (ref 65–99)

## 2015-04-25 MED ORDER — LISINOPRIL 40 MG PO TABS
40.0000 mg | ORAL_TABLET | Freq: Every day | ORAL | Status: DC
  Administered 2015-04-25 – 2015-04-30 (×6): 40 mg via ORAL
  Filled 2015-04-25 (×6): qty 1

## 2015-04-25 MED ORDER — AZITHROMYCIN 250 MG PO TABS
500.0000 mg | ORAL_TABLET | ORAL | Status: DC
  Administered 2015-04-26 – 2015-04-28 (×3): 500 mg via ORAL
  Filled 2015-04-25 (×3): qty 2

## 2015-04-25 MED ORDER — FENTANYL CITRATE (PF) 100 MCG/2ML IJ SOLN
25.0000 ug | Freq: Once | INTRAMUSCULAR | Status: AC
  Administered 2015-04-25: 25 ug via INTRAVENOUS
  Filled 2015-04-25: qty 2

## 2015-04-25 MED ORDER — FOLIC ACID 1 MG PO TABS
1.0000 mg | ORAL_TABLET | Freq: Every day | ORAL | Status: DC
Start: 2015-04-26 — End: 2015-04-30
  Administered 2015-04-26 – 2015-04-30 (×5): 1 mg via ORAL
  Filled 2015-04-25 (×5): qty 1

## 2015-04-25 MED ORDER — AMIODARONE HCL 200 MG PO TABS
200.0000 mg | ORAL_TABLET | Freq: Every day | ORAL | Status: DC
  Administered 2015-04-26 – 2015-04-30 (×5): 200 mg via ORAL
  Filled 2015-04-25 (×5): qty 1

## 2015-04-25 MED ORDER — SODIUM CHLORIDE 0.9 % IV SOLN
INTRAVENOUS | Status: AC
  Administered 2015-04-25: 22:00:00 via INTRAVENOUS

## 2015-04-25 MED ORDER — DEXTROSE 5 % IV SOLN
1.0000 g | INTRAVENOUS | Status: DC
  Administered 2015-04-25 – 2015-04-28 (×4): 1 g via INTRAVENOUS
  Filled 2015-04-25 (×6): qty 10

## 2015-04-25 MED ORDER — IOHEXOL 300 MG/ML  SOLN
100.0000 mL | Freq: Once | INTRAMUSCULAR | Status: AC | PRN
  Administered 2015-04-25: 100 mL via INTRAVENOUS

## 2015-04-25 MED ORDER — ENSURE ENLIVE PO LIQD
237.0000 mL | Freq: Three times a day (TID) | ORAL | Status: DC
  Administered 2015-04-25 – 2015-04-30 (×9): 237 mL via ORAL

## 2015-04-25 MED ORDER — ENOXAPARIN SODIUM 40 MG/0.4ML ~~LOC~~ SOLN
40.0000 mg | SUBCUTANEOUS | Status: DC
  Administered 2015-04-26 – 2015-04-30 (×5): 40 mg via SUBCUTANEOUS
  Filled 2015-04-25 (×5): qty 0.4

## 2015-04-25 MED ORDER — LEVOFLOXACIN IN D5W 750 MG/150ML IV SOLN
750.0000 mg | Freq: Once | INTRAVENOUS | Status: AC
  Administered 2015-04-25: 750 mg via INTRAVENOUS
  Filled 2015-04-25: qty 150

## 2015-04-25 MED ORDER — FLUTICASONE PROPIONATE 50 MCG/ACT NA SUSP
2.0000 | Freq: Every day | NASAL | Status: DC
  Administered 2015-04-25 – 2015-04-30 (×6): 2 via NASAL
  Filled 2015-04-25 (×2): qty 16

## 2015-04-25 MED ORDER — AMLODIPINE BESYLATE 5 MG PO TABS
5.0000 mg | ORAL_TABLET | Freq: Every day | ORAL | Status: DC
  Administered 2015-04-25 – 2015-04-30 (×6): 5 mg via ORAL
  Filled 2015-04-25 (×6): qty 1

## 2015-04-25 MED ORDER — PANTOPRAZOLE SODIUM 40 MG PO TBEC
40.0000 mg | DELAYED_RELEASE_TABLET | Freq: Every day | ORAL | Status: DC
  Administered 2015-04-25 – 2015-04-30 (×6): 40 mg via ORAL
  Filled 2015-04-25 (×6): qty 1

## 2015-04-25 MED ORDER — POLYETHYLENE GLYCOL 3350 17 G PO PACK
17.0000 g | PACK | Freq: Every day | ORAL | Status: DC | PRN
  Administered 2015-04-27 – 2015-04-28 (×2): 17 g via ORAL
  Filled 2015-04-25 (×2): qty 1

## 2015-04-25 MED ORDER — ATORVASTATIN CALCIUM 40 MG PO TABS
40.0000 mg | ORAL_TABLET | Freq: Every day | ORAL | Status: DC
  Administered 2015-04-26 – 2015-04-29 (×4): 40 mg via ORAL
  Filled 2015-04-25 (×4): qty 1

## 2015-04-25 MED ORDER — MEMANTINE HCL 10 MG PO TABS
10.0000 mg | ORAL_TABLET | Freq: Two times a day (BID) | ORAL | Status: DC
  Administered 2015-04-25 – 2015-04-30 (×10): 10 mg via ORAL
  Filled 2015-04-25 (×12): qty 1

## 2015-04-25 MED ORDER — ASPIRIN EC 325 MG PO TBEC
325.0000 mg | DELAYED_RELEASE_TABLET | Freq: Every evening | ORAL | Status: DC
Start: 2015-04-26 — End: 2015-04-30
  Administered 2015-04-26 – 2015-04-29 (×4): 325 mg via ORAL
  Filled 2015-04-25 (×4): qty 1

## 2015-04-25 MED ORDER — SODIUM CHLORIDE 0.9 % IV SOLN
INTRAVENOUS | Status: DC
  Administered 2015-04-25 – 2015-04-27 (×3): via INTRAVENOUS

## 2015-04-25 NOTE — ED Notes (Addendum)
She was brought from nursing home to Dr Wille Glaseraboris office with c.o fall and bruises. She is confused. Staff brought her to the ED for exam and treatment of possible fall and bruises. Pt states she fell last night.

## 2015-04-25 NOTE — H&P (Addendum)
[facility] History and Physical  CHER FRANZONI ZOX:[id number] DOB: [birth year] DOA: 04/25/2015  Referring physician: EDP PCP: [name], MD   Chief Complaint: Falls   HPI: Jacqueline [name] is a 82 y.o. female with h/o mild dementia, CABG, HTN, A.Fib on amiodarone but not on anticoagulants due to fall risk (see Dr. Ludwig Clarks note from 2 days ago).  Patient is in an ALF currently while her family is out of town (usually lives with family and is cared for at home).  Patient is sent in to the ED today with new onset of confusion per her PCP.  She was seeing PCP today due to at least 2 falls at the ALF.  She has numerous bruises.  Patient has had cough recently, although she is unsure when this began.  Review of Systems: Systems reviewed.  As above, otherwise negative  Past Medical History  Diagnosis Date  . Diverticulitis   . Hypertension   . Osteoporosis   . CAD (coronary artery disease)   . Dementia   . PAF (paroxysmal atrial fibrillation) (HCC)   . HLD (hyperlipidemia)   . GERD (gastroesophageal reflux disease)    Past Surgical History  Procedure Laterality Date  . Fracture surgery    . Partial colectomy    . Coronary artery bypass graft N/A 08/25/2012    Procedure: CORONARY ARTERY BYPASS GRAFTING ;  Surgeon: Alleen Borne, MD;  Location: [facility] OR;  Service: Open Heart Surgery;  Laterality: N/A;  four bypasses total   . Left heart catheterization with coronary angiogram N/A 08/22/2012    Procedure: LEFT HEART CATHETERIZATION WITH CORONARY ANGIOGRAM;  Surgeon: Peter M Swaziland, MD;  Location: [facility] CATH LAB;  Service: Cardiovascular;  Laterality: N/A;   Social History:  reports that she has quit smoking. Her smoking use included Cigarettes. She has never used smokeless tobacco. She reports that she does not drink alcohol or use illicit drugs.  No Known Allergies  Family History  Problem Relation Age of Onset  . Hypertension Other   . Cancer Mother      stomach  . Diabetes Neg Hx   . Heart disease Neg Hx   . Stroke Neg Hx      Prior to Admission medications   Medication Sig Start Date End Date Taking? Authorizing Provider  alendronate (FOSAMAX) 70 MG tablet TAKE 1 TABLET BY MOUTH EVERY 7 DAYS. TAKE WITH A FULL GLASS OF WATER ON AN EMPTY STOMACH 02/25/15   Sheliah Hatch, MD  amiodarone (PACERONE) 200 MG tablet TAKE 1 TABLET BY MOUTH DAILY. 01/02/15   Sheliah Hatch, MD  amLODipine (NORVASC) 5 MG tablet Take 1 tablet (5 mg total) by mouth daily. 04/23/15   Lewayne Bunting, MD  aspirin EC 325 MG tablet Take 325 mg by mouth every evening.     Historical Provider, MD  atorvastatin (LIPITOR) 40 MG tablet TAKE 1 TABLET BY MOUTH DAILY AT 6 PM 01/09/15   Sheliah Hatch, MD  BESIVANCE 0.6 % SUSP PLACE 1 DROP IN RIGHT EYE ONE TIME  A DAY 01/11/15   Historical Provider, MD  diazepam (VALIUM) 2 MG tablet 1/2 tab daily and then use other 1/2 as needed for high anxiety moments 01/22/15   Sheliah Hatch, MD  feeding supplement, ENSURE ENLIVE, (ENSURE ENLIVE) LIQD Take 237 mLs by mouth 3 (three) times daily between meals. 01/17/15   Calvert Cantor, MD  fluticasone (FLONASE) 50 MCG/ACT nasal spray Place 2 sprays into both nostrils daily. 10/05/13  Sheliah Hatch, MD  folic acid (FOLVITE) 1 MG tablet TAKE 1 TABLET BY MOUTH EVERY DAY 04/16/15   Sheliah Hatch, MD  ILEVRO 0.3 % ophthalmic suspension INSTILL 1 DROP IN RIGHT EYE AT AFTERNOON 01/11/15   Historical Provider, MD  lisinopril (PRINIVIL,ZESTRIL) 40 MG tablet Take 1 tablet (40 mg total) by mouth daily. 09/11/14   Lewayne Bunting, MD  LUMIGAN 0.01 % SOLN PLACE 1 DROP INTO BOTH EYES AT BEDTIME. 08/29/14   Historical Provider, MD  memantine (NAMENDA) 10 MG tablet TAKE 1 TABLET (10 MG TOTAL) BY MOUTH 2 (TWO) TIMES DAILY. 02/07/15   Drema Dallas, DO  Multiple Vitamin (MULTIVITAMIN) tablet Take 0.5 tablets by mouth 2 (two) times daily.     Historical Provider, MD  omeprazole (PRILOSEC) 20 MG  capsule Take 1 capsule (20 mg total) by mouth daily. 01/12/15   Benjiman Core, MD  polyethylene glycol [facility] / GLYCOLAX) packet Take 17 g by mouth daily as needed. 01/17/15   Calvert Cantor, MD   Physical Exam: Filed Vitals:   04/25/15 1930 04/25/15 2057  BP: 176/80 119/78  Pulse: 61 69  Temp:  98.4 F (36.9 C)  Resp: 22 21    BP 119/78 mmHg  Pulse 69  Temp(Src) 98.4 F (36.9 C) (Oral)  Resp 21  Ht 5\' 6"  (1.676 m)  Wt 51.574 kg (113 lb 11.2 oz)  BMI 18.36 kg/m2  SpO2 94%  General Appearance:    Alert, oriented, no distress, appears stated age  Head:    Normocephalic, diffuse bruising to face and neck  Eyes:    PERRL, EOMI, sclera non-icteric        Nose:   Nares without drainage or epistaxis. Mucosa, turbinates normal  Throat:   Moist mucous membranes. Oropharynx without erythema or exudate.  Neck:   Supple. No carotid bruits.  No thyromegaly.  No lymphadenopathy.   Back:     No CVA tenderness, no spinal tenderness  Lungs:     Wet sounding cough  Chest wall:    No tenderness to palpitation  Heart:    Regular rate and rhythm without murmurs, gallops, rubs  Abdomen:     Soft, non-tender, nondistended, normal bowel sounds, no organomegaly  Genitalia:    deferred  Rectal:    deferred  Extremities:   No clubbing, cyanosis or edema.  Pulses:   2+ and symmetric all extremities  Skin:   Skin color, texture, turgor normal, no rashes or lesions  Lymph nodes:   Cervical, supraclavicular, and axillary nodes normal  Neurologic:   CNII-XII intact. Normal strength, sensation and reflexes      throughout    Labs on Admission:  Basic Metabolic Panel:  Recent Labs Lab 04/25/15 1430  NA 142  K 3.2*  CL 108  CO2 28  GLUCOSE 149*  BUN 28*  CREATININE 0.64  CALCIUM 8.8*   Liver Function Tests:  Recent Labs Lab 04/25/15 1430  AST 112*  ALT 143*  ALKPHOS 73  BILITOT 0.6  PROT 6.3*  ALBUMIN 2.7*    Recent Labs Lab 04/25/15 1430  LIPASE 53*   No results for  input(s): AMMONIA in the last 168 hours. CBC:  Recent Labs Lab 04/25/15 1430  WBC 9.5  NEUTROABS 8.3*  HGB 11.8*  HCT 37.0  MCV 103.4*  PLT 230   Cardiac Enzymes:  Recent Labs Lab 04/25/15 1430  TROPONINI <0.03    BNP (last 3 results) No results for input(s): PROBNP in the last  8760 hours. CBG:  Recent Labs Lab 04/25/15 1451  GLUCAP 140*    Radiological Exams on Admission: Ct Head Wo Contrast  04/25/2015  CLINICAL DATA:  Fall.  Altered mental status.  Bruises. EXAM: CT HEAD WITHOUT CONTRAST CT MAXILLOFACIAL WITHOUT CONTRAST CT CERVICAL SPINE WITHOUT CONTRAST TECHNIQUE: Multidetector CT imaging of the head, cervical spine, and maxillofacial structures were performed using the standard protocol without intravenous contrast. Multiplanar CT image reconstructions of the cervical spine and maxillofacial structures were also generated. COMPARISON:  04/17/2015 head, cervical spine and maxillofacial CT. FINDINGS: CT HEAD FINDINGS Left frontal scalp contusion is decreased since 04/17/2015. No evidence of parenchymal hemorrhage or extra-axial fluid collection. No mass lesion, mass effect, or midline shift. No CT evidence of acute infarction. Intracranial atherosclerosis. Diffuse cerebral volume loss. Nonspecific prominent stable subcortical and periventricular white matter hypodensity, most in keeping with chronic small vessel ischemic change. Stable small right lentiform nucleus lacunar infarct. No ventriculomegaly. There are new small air-fluid levels in the bilateral maxillary sinuses, with associated mucosal thickening in the bilateral maxillary sinuses. Partial opacification of the bilateral ethmoidal air cells. Stable tiny mucous retention cyst versus polyp in the posterior left frontal sinus. Mild mucosal thickening and inspissated secretions in the sphenoid sinus. New partial opacification of the right mastoid air cells. Left mastoid air cells are unopacified. No evidence of calvarial  fracture. CT MAXILLOFACIAL FINDINGS Re- demonstration of small left frontal scalp contusion. No maxillofacial fracture. The maxilla and mandible appear intact. The nasal bones are unremarkable in appearance. There is stable levocurvature of the intact appearing nasal septum. No dislocation at the temporomandibular joints. 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 The visualized dentition demonstrates no acute abnormality. The orbits are intact bilaterally. Re- demonstration of a new small fluid levels in the bilateral maxillary sinuses with associated mild mucosal thickening in the bilateral maxillary sinuses. Partial opacification of the bilateral ethmoidal air cells. Stable tiny mucous retention cyst versus polyp in the posterior left frontal sinus. Mild mucosal thickening with mild inspissated secretions in the sphenoid sinus. No partial opacification of right mastoid air cells. Unopacified left mastoid air cells. No aggressive appearing focal osseous lesions. The parapharyngeal fat planes are preserved. The nasopharynx, oropharynx and hypopharynx are unremarkable in appearance. The parotid and submandibular glands are within normal limits. No cervical lymphadenopathy is seen. CT CERVICAL SPINE FINDINGS No fracture is detected in the cervical spine. Stable subacute to chronic moderate T3 vertebral compression fracture with approximately 40% loss of vertebral body height, unchanged since 04/17/2015. No prevertebral soft tissue swelling. Normal cervical lordosis. Dens is well positioned between the lateral masses of C1. The lateral masses appear well-aligned. Moderate degenerative disc disease throughout the cervical spine, most prominent at C6-7. Moderate facet arthropathy in the cervical spine, asymmetric to the left. No significant cervical foraminal stenosis. No cervical spine subluxation. New partial opacification of the right mastoid air cells. Visualized left mastoid air cells appear  unopacified. No evidence of intra-axial hemorrhage in the visualized brain. No gross cervical canal hematoma. Apical right upper lobe 4 mm pulmonary nodule (series 12/ image 73), stable since 04/17/2015. No cervical adenopathy or other significant neck soft tissue abnormality. IMPRESSION: 1. Small left frontal scalp contusion, decreased since 04/17/2015. No evidence of acute intracranial abnormality. No calvarial fracture. 2. No maxillofacial fracture. 3. No fracture or subluxation in the cervical spine. 4. Subacute to chronic moderate T3 vertebral compression fracture, stable. 5. Acute paranasal sinusitis, worsened. New partial opacification of the right mastoid air cells, nonspecific, favor a  mild infectious or inflammatory right mastoiditis. 6. Diffuse cerebral atrophy, prominent chronic small vessel ischemic white matter change and chronic right basal ganglia lacunar infarct. Electronically Signed   By: Delbert Phenix M.D.   On: 04/25/2015 16:14   Ct Chest W Contrast  04/25/2015  CLINICAL DATA:  Status post fall last night. Pain and bruising. Initial encounter. EXAM: CT CHEST, ABDOMEN, AND PELVIS WITH CONTRAST TECHNIQUE: Multidetector CT imaging of the chest, abdomen and pelvis was performed following the standard protocol during bolus administration of intravenous contrast. CONTRAST:  100 ML OMNIPAQUE IOHEXOL 300 MG/ML  SOLN COMPARISON:  CT abdomen and pelvis 01/12/2015. PA and lateral chest 01/12/2015. MRI abdomen 12/21/2014. FINDINGS: CT CHEST FINDINGS Mediastinum/Lymph Nodes: No evidence of posttraumatic change is identified. There is cardiomegaly. Calcific aortic and coronary atherosclerosis is noted. The patient is status post CABG. Lungs/Pleura: Trace bilateral pleural effusions are seen. There is an area of ground-glass attenuation in the anterior right upper lobe. Dependent atelectasis is noted. Musculoskeletal: Remote T3 and T12 compression fractures are identified. Vertebral body height loss of T6  has progressed since the prior PA and lateral chest films. No acute fracture is identified. CT ABDOMEN PELVIS FINDINGS Hepatobiliary: A single small gallstone is identified. No evidence of cholecystitis is seen. No biliary ductal dilatation. Pancreas: Multilocular cystic lesion in the tail the pancreas is unchanged. Spleen: Unremarkable. Adrenals/Urinary Tract: The adrenal glands appear normal. Renal cysts are unchanged. Stomach/Bowel: Surgical anastomosis in the sigmoid colon is noted. The colon is otherwise unremarkable. Stomach and small bowel appear normal. Vascular/Lymphatic: IVC filter is noted. The patient has extensive aortoiliac atherosclerosis without aneurysm. Reproductive: Status post hysterectomy. Other: None. Musculoskeletal: Right hip replacement is seen. Remote pubic ramus fractures are present bilaterally. Lower lumbar spondylosis is noted. IMPRESSION: Negative for trauma to the chest, abdomen or pelvis. Small area of ground-glass attenuation anterior right upper lobe may be due to infectious or inflammatory process. Cardiomegaly. Extensive aortic and coronary atherosclerosis. Single small gallstone without evidence of cholecystitis. No change in a small multilocular cystic lesion in the tail of the pancreas. Remote T3, T6 and T12 compression fractures. Vertebral body height loss of T6 has progressed since the prior examinations. Remote pubic ramus fractures are also noted. Electronically Signed   By: Drusilla Kanner M.D.   On: 04/25/2015 16:03   Ct Cervical Spine Wo Contrast  04/25/2015  CLINICAL DATA:  Fall.  Altered mental status.  Bruises. EXAM: CT HEAD WITHOUT CONTRAST CT MAXILLOFACIAL WITHOUT CONTRAST CT CERVICAL SPINE WITHOUT CONTRAST TECHNIQUE: Multidetector CT imaging of the head, cervical spine, and maxillofacial structures were performed using the standard protocol without intravenous contrast. Multiplanar CT image reconstructions of the cervical spine and maxillofacial structures  were also generated. COMPARISON:  04/17/2015 head, cervical spine and maxillofacial CT. FINDINGS: CT HEAD FINDINGS Left frontal scalp contusion is decreased since 04/17/2015. No evidence of parenchymal hemorrhage or extra-axial fluid collection. No mass lesion, mass effect, or midline shift. No CT evidence of acute infarction. Intracranial atherosclerosis. Diffuse cerebral volume loss. Nonspecific prominent stable subcortical and periventricular white matter hypodensity, most in keeping with chronic small vessel ischemic change. Stable small right lentiform nucleus lacunar infarct. No ventriculomegaly. There are new small air-fluid levels in the bilateral maxillary sinuses, with associated mucosal thickening in the bilateral maxillary sinuses. Partial opacification of the bilateral ethmoidal air cells. Stable tiny mucous retention cyst versus polyp in the posterior left frontal sinus. Mild mucosal thickening and inspissated secretions in the sphenoid sinus. New partial opacification of the right  mastoid air cells. Left mastoid air cells are unopacified. No evidence of calvarial fracture. CT MAXILLOFACIAL FINDINGS Re- demonstration of small left frontal scalp contusion. No maxillofacial fracture. The maxilla and mandible appear intact. The nasal bones are unremarkable in appearance. There is stable levocurvature of the intact appearing nasal septum. No dislocation at the temporomandibular joints. 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 The visualized dentition demonstrates no acute abnormality. The orbits are intact bilaterally. Re- demonstration of a new small fluid levels in the bilateral maxillary sinuses with associated mild mucosal thickening in the bilateral maxillary sinuses. Partial opacification of the bilateral ethmoidal air cells. Stable tiny mucous retention cyst versus polyp in the posterior left frontal sinus. Mild mucosal thickening with mild inspissated secretions in the sphenoid  sinus. No partial opacification of right mastoid air cells. Unopacified left mastoid air cells. No aggressive appearing focal osseous lesions. The parapharyngeal fat planes are preserved. The nasopharynx, oropharynx and hypopharynx are unremarkable in appearance. The parotid and submandibular glands are within normal limits. No cervical lymphadenopathy is seen. CT CERVICAL SPINE FINDINGS No fracture is detected in the cervical spine. Stable subacute to chronic moderate T3 vertebral compression fracture with approximately 40% loss of vertebral body height, unchanged since 04/17/2015. No prevertebral soft tissue swelling. Normal cervical lordosis. Dens is well positioned between the lateral masses of C1. The lateral masses appear well-aligned. Moderate degenerative disc disease throughout the cervical spine, most prominent at C6-7. Moderate facet arthropathy in the cervical spine, asymmetric to the left. No significant cervical foraminal stenosis. No cervical spine subluxation. New partial opacification of the right mastoid air cells. Visualized left mastoid air cells appear unopacified. No evidence of intra-axial hemorrhage in the visualized brain. No gross cervical canal hematoma. Apical right upper lobe 4 mm pulmonary nodule (series 12/ image 73), stable since 04/17/2015. No cervical adenopathy or other significant neck soft tissue abnormality. IMPRESSION: 1. Small left frontal scalp contusion, decreased since 04/17/2015. No evidence of acute intracranial abnormality. No calvarial fracture. 2. No maxillofacial fracture. 3. No fracture or subluxation in the cervical spine. 4. Subacute to chronic moderate T3 vertebral compression fracture, stable. 5. Acute paranasal sinusitis, worsened. New partial opacification of the right mastoid air cells, nonspecific, favor a mild infectious or inflammatory right mastoiditis. 6. Diffuse cerebral atrophy, prominent chronic small vessel ischemic white matter change and chronic  right basal ganglia lacunar infarct. Electronically Signed   By: Delbert PhenixJason A Poff M.D.   On: 04/25/2015 16:14   Ct Abdomen Pelvis W Contrast  04/25/2015  CLINICAL DATA:  Status post fall last night. Pain and bruising. Initial encounter. EXAM: CT CHEST, ABDOMEN, AND PELVIS WITH CONTRAST TECHNIQUE: Multidetector CT imaging of the chest, abdomen and pelvis was performed following the standard protocol during bolus administration of intravenous contrast. CONTRAST:  100 ML OMNIPAQUE IOHEXOL 300 MG/ML  SOLN COMPARISON:  CT abdomen and pelvis 01/12/2015. PA and lateral chest 01/12/2015. MRI abdomen 12/21/2014. FINDINGS: CT CHEST FINDINGS Mediastinum/Lymph Nodes: No evidence of posttraumatic change is identified. There is cardiomegaly. Calcific aortic and coronary atherosclerosis is noted. The patient is status post CABG. Lungs/Pleura: Trace bilateral pleural effusions are seen. There is an area of ground-glass attenuation in the anterior right upper lobe. Dependent atelectasis is noted. Musculoskeletal: Remote T3 and T12 compression fractures are identified. Vertebral body height loss of T6 has progressed since the prior PA and lateral chest films. No acute fracture is identified. CT ABDOMEN PELVIS FINDINGS Hepatobiliary: A single small gallstone is identified. No evidence of cholecystitis is seen. No biliary  ductal dilatation. Pancreas: Multilocular cystic lesion in the tail the pancreas is unchanged. Spleen: Unremarkable. Adrenals/Urinary Tract: The adrenal glands appear normal. Renal cysts are unchanged. Stomach/Bowel: Surgical anastomosis in the sigmoid colon is noted. The colon is otherwise unremarkable. Stomach and small bowel appear normal. Vascular/Lymphatic: IVC filter is noted. The patient has extensive aortoiliac atherosclerosis without aneurysm. Reproductive: Status post hysterectomy. Other: None. Musculoskeletal: Right hip replacement is seen. Remote pubic ramus fractures are present bilaterally. Lower lumbar  spondylosis is noted. IMPRESSION: Negative for trauma to the chest, abdomen or pelvis. Small area of ground-glass attenuation anterior right upper lobe may be due to infectious or inflammatory process. Cardiomegaly. Extensive aortic and coronary atherosclerosis. Single small gallstone without evidence of cholecystitis. No change in a small multilocular cystic lesion in the tail of the pancreas. Remote T3, T6 and T12 compression fractures. Vertebral body height loss of T6 has progressed since the prior examinations. Remote pubic ramus fractures are also noted. Electronically Signed   By: Drusilla Kanner M.D.   On: 04/25/2015 16:03   Ct Maxillofacial Wo Cm  04/25/2015  CLINICAL DATA:  Fall.  Altered mental status.  Bruises. EXAM: CT HEAD WITHOUT CONTRAST CT MAXILLOFACIAL WITHOUT CONTRAST CT CERVICAL SPINE WITHOUT CONTRAST TECHNIQUE: Multidetector CT imaging of the head, cervical spine, and maxillofacial structures were performed using the standard protocol without intravenous contrast. Multiplanar CT image reconstructions of the cervical spine and maxillofacial structures were also generated. COMPARISON:  04/17/2015 head, cervical spine and maxillofacial CT. FINDINGS: CT HEAD FINDINGS Left frontal scalp contusion is decreased since 04/17/2015. No evidence of parenchymal hemorrhage or extra-axial fluid collection. No mass lesion, mass effect, or midline shift. No CT evidence of acute infarction. Intracranial atherosclerosis. Diffuse cerebral volume loss. Nonspecific prominent stable subcortical and periventricular white matter hypodensity, most in keeping with chronic small vessel ischemic change. Stable small right lentiform nucleus lacunar infarct. No ventriculomegaly. There are new small air-fluid levels in the bilateral maxillary sinuses, with associated mucosal thickening in the bilateral maxillary sinuses. Partial opacification of the bilateral ethmoidal air cells. Stable tiny mucous retention cyst versus  polyp in the posterior left frontal sinus. Mild mucosal thickening and inspissated secretions in the sphenoid sinus. New partial opacification of the right mastoid air cells. Left mastoid air cells are unopacified. No evidence of calvarial fracture. CT MAXILLOFACIAL FINDINGS Re- demonstration of small left frontal scalp contusion. No maxillofacial fracture. The maxilla and mandible appear intact. The nasal bones are unremarkable in appearance. There is stable levocurvature of the intact appearing nasal septum. No dislocation at the temporomandibular joints. 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 The visualized dentition demonstrates no acute abnormality. The orbits are intact bilaterally. Re- demonstration of a new small fluid levels in the bilateral maxillary sinuses with associated mild mucosal thickening in the bilateral maxillary sinuses. Partial opacification of the bilateral ethmoidal air cells. Stable tiny mucous retention cyst versus polyp in the posterior left frontal sinus. Mild mucosal thickening with mild inspissated secretions in the sphenoid sinus. No partial opacification of right mastoid air cells. Unopacified left mastoid air cells. No aggressive appearing focal osseous lesions. The parapharyngeal fat planes are preserved. The nasopharynx, oropharynx and hypopharynx are unremarkable in appearance. The parotid and submandibular glands are within normal limits. No cervical lymphadenopathy is seen. CT CERVICAL SPINE FINDINGS No fracture is detected in the cervical spine. Stable subacute to chronic moderate T3 vertebral compression fracture with approximately 40% loss of vertebral body height, unchanged since 04/17/2015. No prevertebral soft tissue swelling. Normal cervical lordosis. Dens  is well positioned between the lateral masses of C1. The lateral masses appear well-aligned. Moderate degenerative disc disease throughout the cervical spine, most prominent at C6-7. Moderate facet  arthropathy in the cervical spine, asymmetric to the left. No significant cervical foraminal stenosis. No cervical spine subluxation. New partial opacification of the right mastoid air cells. Visualized left mastoid air cells appear unopacified. No evidence of intra-axial hemorrhage in the visualized brain. No gross cervical canal hematoma. Apical right upper lobe 4 mm pulmonary nodule (series 12/ image 73), stable since 04/17/2015. No cervical adenopathy or other significant neck soft tissue abnormality. IMPRESSION: 1. Small left frontal scalp contusion, decreased since 04/17/2015. No evidence of acute intracranial abnormality. No calvarial fracture. 2. No maxillofacial fracture. 3. No fracture or subluxation in the cervical spine. 4. Subacute to chronic moderate T3 vertebral compression fracture, stable. 5. Acute paranasal sinusitis, worsened. New partial opacification of the right mastoid air cells, nonspecific, favor a mild infectious or inflammatory right mastoiditis. 6. Diffuse cerebral atrophy, prominent chronic small vessel ischemic white matter change and chronic right basal ganglia lacunar infarct. Electronically Signed   By: Delbert Phenix M.D.   On: 04/25/2015 16:14    EKG: Independently reviewed.  Assessment/Plan  Principal Problem:   Right upper lobe pneumonia Active Problems:   Atrial fibrillation (HCC)   Fall   1. RUL PNA - small area of ground glass attenuation in RUL, wet sounding cough, and confusion.  Will go ahead and treat this as CAP at this point.  Switch to HCAP coverage if patient worsens in any way, but she is not a long term resident of the ALF, last hospital stay was October, she has 0 SIRS criteria, so other than causing confusion this appears very mild at the moment. 1. Cultures pending 2. PNA pathway 3. CAP coverage with rocephin and azithromycin for now (see above) 4. Flu pnl pending, review of records shows she just got the flu shot on 04/16/15 per the office note on  that day. 2. PAF - continue amiodarone, no anticoagulants other than DVT ppx lovenox due to fall risk 3. HTN - continue home meds 4. HLD - continue home meds    Code Status: DNR Family Communication: No family in room Disposition Plan: Admit to obs   Time spent: 70 min  Alden Bensinger M. [facility] Pager [phone]  If 7AM-7PM, please contact the day team taking care of the patient [email or web address] Password [facility] 04/25/2015, 9:50 PM

## 2015-04-25 NOTE — ED Notes (Signed)
Nursing home employee Jacqueline [name] will take pt personal wheelchair back to the facility with him. Updated on pt status and pending inpatient admit.

## 2015-04-25 NOTE — ED Provider Notes (Signed)
Patient seen and evaluated. Discussed with PA.  Heart the patient's arrival, I received a call from her primary care physician Dr. Beverely Lowabori, upstairs.  Apparently the patient normally resides with her family. [place] to go to [place] a family member was having a [place] [facility]. Normally she is well cared for at home. However, she has been in respite temporarily as her family is been out of town.  No new environment she's had several falls. Seen and evaluated here week ago with falls and facial contusions.  Dr. Christell ConstantMoore was concerned that she had additional fall and complains of back pain and seems "more confused today".  Exam the patient is awake and alert. She is able to give me her full name and history. Is able to describe her family being out of town to [place]. She has marked evolving left facial and  Contusions of the left neck. She complains of rib and back pain both thoracic and lumbar. Her abdomen is distended but soft and hyperactive. She describes diarrhea for 2 days, and constipation today.  Plan is imaging, lab and urine evaluation, symptom control. Reevaluation.  Rolland PorterMark Mikiya Nebergall, MD 04/25/15 [phone]

## 2015-04-25 NOTE — Progress Notes (Signed)
Pre visit review using our clinic review tool, if applicable. No additional management support is needed unless otherwise documented below in the visit note. 

## 2015-04-25 NOTE — ED Notes (Signed)
Patient's driver is here, will be waiting in the car for an update - Ronna PolioBobby Brown [phone].

## 2015-04-25 NOTE — Progress Notes (Signed)
   Subjective:    Patient ID: Jacqueline [name], female    DOB: 1928/07/01, [age] y.o.   MRN: [id number]  HPI ER f/u- pt fell in the bathroom on 1/4, hitting her face on the sink.  She was transported to ER by EMS.  Had normal CT head, neck, maxillofacial and negative hip, pelvis, and CXR.  Was d/c'd to [facility] while family is tending to their own health issues.  Pt saw Cards on 1/10 and due to her pain level, he recommended that she f/u here today.  Pt is not making much sense today- crying out in pain, not wanting to open her eyes, saying 'i'm so glad i found you'.  She states she has fallen multiple times in the last week but is unable to tell me how many or when they occurred.  She is sliding out of her wheelchair but unable to sit upright due to pain.  She reports severe back and abdominal pain.  Unable to obtain additional hx due to pt's state   Review of Systems For ROS see HPI     Objective:   Physical Exam  Constitutional:  Frail, elderly woman sliding out of wheelchair   HENT:  Extensive facial bruising, L>R  Abdominal: Soft. Bowel sounds are normal. She exhibits distension. There is no tenderness. There is no rebound.  Musculoskeletal:  Severe back pain, unable to reposition pt w/o severe pain  Neurological:  Oriented to person, not to place or time  Skin: Skin is warm and dry.  Psychiatric:  In obvious distress Thought process is not linear today- a departure from her normal state  Vitals reviewed.         Assessment & Plan:

## 2015-04-25 NOTE — ED Provider Notes (Signed)
CSN: [id number]     Arrival date & time 04/25/15  1403 History   First MD Initiated Contact with Patient 04/25/15 1416     Chief Complaint  Patient presents with  . Fall    HPI   [name] is an 82 y.o. female with history of mild dementia, HTN, CAD, afib, GERD who was sent to the ED by her PCP [name], MD) for evaluation after a fall and possible confusion. At baseline pt lives at home with her son. However, for the past week or so pt has been a resident at [facility] for respite care as pt's son is in [place] for a surgical procedure of his own. Pt had apparently fallen at home on 04/17/15 the day before admission to [facility]. She was seen in the ED for evaluation and discharged home. Pt was seen by PCP today who directed her to the ED as pt appeared confused, was "bruised from head to toe", and complaining of pain. Dr. Beverely Low spoke via telephone directly with attending Dr. Fayrene Fearing and said that she follows with pt regularly and feels pt seems not herself and confused as well.  In the ED now the pt is alert and oriented. She states she is here because of pain from a fall. She states she fell last week before her son left but fell again last night in the restroom at [facility]. She is unable to clarify how she fell and is unsure if she hit her head or lost consciousness. She states she knows she was trying to get from the toilet to her wheelchair. She is also complaining of pain across her entire upper abdomen. She states that she hurts all over when she tries to walk or get around. She states it is very painful in her back as well. Pt also reports a cough and is coughing in the ED though is unsure when her coughing began.     Past Medical History  Diagnosis Date  . Diverticulitis   . Hypertension   . Osteoporosis   . CAD (coronary artery disease)   . Dementia   . PAF (paroxysmal atrial fibrillation) (HCC)   . HLD (hyperlipidemia)   . GERD (gastroesophageal  reflux disease)    Past Surgical History  Procedure Laterality Date  . Fracture surgery    . Partial colectomy    . Coronary artery bypass graft N/A 08/25/2012    Procedure: CORONARY ARTERY BYPASS GRAFTING ;  Surgeon: Alleen Borne, MD;  Location: [facility] OR;  Service: Open Heart Surgery;  Laterality: N/A;  four bypasses total   . Left heart catheterization with coronary angiogram N/A 08/22/2012    Procedure: LEFT HEART CATHETERIZATION WITH CORONARY ANGIOGRAM;  Surgeon: Peter M Swaziland, MD;  Location: [facility] CATH LAB;  Service: Cardiovascular;  Laterality: N/A;   Family History  Problem Relation Age of Onset  . Hypertension Other   . Cancer Mother     stomach  . Diabetes Neg Hx   . Heart disease Neg Hx   . Stroke Neg Hx    Social History  Substance Use Topics  . Smoking status: Former Smoker    Types: Cigarettes  . Smokeless tobacco: Never Used  . Alcohol Use: No     Comment: Occasional   OB History    No data available     Review of Systems  All other systems reviewed and are negative.     Allergies  Review of patient's allergies indicates no known  allergies.  Home Medications   Prior to Admission medications   Medication Sig Start Date End Date Taking? Authorizing Provider  alendronate (FOSAMAX) 70 MG tablet TAKE 1 TABLET BY MOUTH EVERY 7 DAYS. TAKE WITH A FULL GLASS OF WATER ON AN EMPTY STOMACH 02/25/15   Sheliah Hatch, MD  amiodarone (PACERONE) 200 MG tablet TAKE 1 TABLET BY MOUTH DAILY. 01/02/15   Sheliah Hatch, MD  amLODipine (NORVASC) 5 MG tablet Take 1 tablet (5 mg total) by mouth daily. 04/23/15   Lewayne Bunting, MD  aspirin EC 325 MG tablet Take 325 mg by mouth every evening.     Historical Provider, MD  atorvastatin (LIPITOR) 40 MG tablet TAKE 1 TABLET BY MOUTH DAILY AT 6 PM 01/09/15   Sheliah Hatch, MD  BESIVANCE 0.6 % SUSP PLACE 1 DROP IN RIGHT EYE ONE TIME  A DAY 01/11/15   Historical Provider, MD  diazepam (VALIUM) 2 MG tablet 1/2 tab daily and then  use other 1/2 as needed for high anxiety moments 01/22/15   Sheliah Hatch, MD  feeding supplement, ENSURE ENLIVE, (ENSURE ENLIVE) LIQD Take 237 mLs by mouth 3 (three) times daily between meals. 01/17/15   Calvert Cantor, MD  fluticasone (FLONASE) 50 MCG/ACT nasal spray Place 2 sprays into both nostrils daily. 10/05/13   Sheliah Hatch, MD  folic acid (FOLVITE) 1 MG tablet TAKE 1 TABLET BY MOUTH EVERY DAY 04/16/15   Sheliah Hatch, MD  ILEVRO 0.3 % ophthalmic suspension INSTILL 1 DROP IN RIGHT EYE AT AFTERNOON 01/11/15   Historical Provider, MD  lisinopril (PRINIVIL,ZESTRIL) 40 MG tablet Take 1 tablet (40 mg total) by mouth daily. 09/11/14   Lewayne Bunting, MD  LUMIGAN 0.01 % SOLN PLACE 1 DROP INTO BOTH EYES AT BEDTIME. 08/29/14   Historical Provider, MD  memantine (NAMENDA) 10 MG tablet TAKE 1 TABLET (10 MG TOTAL) BY MOUTH 2 (TWO) TIMES DAILY. 02/07/15   Drema Dallas, DO  Multiple Vitamin (MULTIVITAMIN) tablet Take 0.5 tablets by mouth 2 (two) times daily.     Historical Provider, MD  omeprazole (PRILOSEC) 20 MG capsule Take 1 capsule (20 mg total) by mouth daily. 01/12/15   Benjiman Core, MD  polyethylene glycol [facility] / GLYCOLAX) packet Take 17 g by mouth daily as needed. 01/17/15   Calvert Cantor, MD   BP 127/61 mmHg  Pulse 66  Temp(Src) 97.6 F (36.4 C) (Axillary)  Resp 18  Ht 5\' 6"  (1.676 m)  Wt 48.081 kg  BMI 17.12 kg/m2  SpO2 96% Physical Exam  Constitutional: She is oriented to person, place, and time. No distress.  HENT:  Right Ear: External ear normal.  Left Ear: External ear normal.  Nose: Nose normal.  Mouth/Throat: Oropharynx is clear and moist.  Left side of face with diffuse bruising. Bruising around bilateral periorbital areas. No crepitus. Mild edema to left forehead and cheek. No intraoral lesions or bleeding.  Eyes: Conjunctivae and EOM are normal. Pupils are equal, round, and reactive to light.  Neck: Normal range of motion. Neck supple.  Diffusely ttp   Cardiovascular: Normal rate, regular rhythm and normal heart sounds.   Pulmonary/Chest: Effort normal and breath sounds normal.  Chest diffusely ttp  Abdominal: Soft. Bowel sounds are normal.  Abdomen mildly distended. Upper abdomen is diffusely TTP.   Musculoskeletal: Normal range of motion. She exhibits no edema.  Pt is diffusely ttp wherever you touch her. Back is diffusely ttp. She has multiple areas of old bruising,  particularly on her left forearm and right dorsum of her hand.   Neurological: She is alert and oriented to person, place, and time. She has normal strength. No sensory deficit. GCS eye subscore is 4. GCS verbal subscore is 5. GCS motor subscore is 6.  Negative pronator drift  Skin: Skin is warm and dry. She is not diaphoretic.  Psychiatric: She has a normal mood and affect.  Nursing note and vitals reviewed.  Orthostatic VS for the past 24 hrs:  BP- Lying Pulse- Lying BP- Sitting BP- Standing at 0 minutes  04/25/15 1754 182/82 mmHg 76 - -       ED Course  Procedures (including critical care time) Labs Review Labs Reviewed  COMPREHENSIVE METABOLIC PANEL - Abnormal; Notable for the following:    Potassium 3.2 (*)    Glucose, Bld 149 (*)    BUN 28 (*)    Calcium 8.8 (*)    Total Protein 6.3 (*)    Albumin 2.7 (*)    AST 112 (*)    ALT 143 (*)    All other components within normal limits  CBC WITH DIFFERENTIAL/PLATELET - Abnormal; Notable for the following:    RBC 3.58 (*)    Hemoglobin 11.8 (*)    MCV 103.4 (*)    Neutro Abs 8.3 (*)    Lymphs Abs 0.6 (*)    All other components within normal limits  URINALYSIS, ROUTINE W REFLEX MICROSCOPIC (NOT AT [facility]) - Abnormal; Notable for the following:    Color, Urine AMBER (*)    Bilirubin Urine SMALL (*)    Protein, ur 30 (*)    All other components within normal limits  LIPASE, BLOOD - Abnormal; Notable for the following:    Lipase 53 (*)    All other components within normal limits  URINE MICROSCOPIC-ADD ON  - Abnormal; Notable for the following:    Squamous Epithelial / LPF 0-5 (*)    Bacteria, UA RARE (*)    All other components within normal limits  CBG MONITORING, ED - Abnormal; Notable for the following:    Glucose-Capillary 140 (*)    All other components within normal limits  CULTURE, BLOOD (ROUTINE X 2)  CULTURE, BLOOD (ROUTINE X 2)  TROPONIN I    Imaging Review Ct Head Wo Contrast  04/25/2015  CLINICAL DATA:  Fall.  Altered mental status.  Bruises. EXAM: CT HEAD WITHOUT CONTRAST CT MAXILLOFACIAL WITHOUT CONTRAST CT CERVICAL SPINE WITHOUT CONTRAST TECHNIQUE: Multidetector CT imaging of the head, cervical spine, and maxillofacial structures were performed using the standard protocol without intravenous contrast. Multiplanar CT image reconstructions of the cervical spine and maxillofacial structures were also generated. COMPARISON:  04/17/2015 head, cervical spine and maxillofacial CT. FINDINGS: CT HEAD FINDINGS Left frontal scalp contusion is decreased since 04/17/2015. No evidence of parenchymal hemorrhage or extra-axial fluid collection. No mass lesion, mass effect, or midline shift. No CT evidence of acute infarction. Intracranial atherosclerosis. Diffuse cerebral volume loss. Nonspecific prominent stable subcortical and periventricular white matter hypodensity, most in keeping with chronic small vessel ischemic change. Stable small right lentiform nucleus lacunar infarct. No ventriculomegaly. There are new small air-fluid levels in the bilateral maxillary sinuses, with associated mucosal thickening in the bilateral maxillary sinuses. Partial opacification of the bilateral ethmoidal air cells. Stable tiny mucous retention cyst versus polyp in the posterior left frontal sinus. Mild mucosal thickening and inspissated secretions in the sphenoid sinus. New partial opacification of the right mastoid air cells. Left mastoid air cells are  unopacified. No evidence of calvarial fracture. CT MAXILLOFACIAL  FINDINGS Re- demonstration of small left frontal scalp contusion. No maxillofacial fracture. The maxilla and mandible appear intact. The nasal bones are unremarkable in appearance. There is stable levocurvature of the intact appearing nasal septum. No dislocation at the temporomandibular joints. 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 The visualized dentition demonstrates no acute abnormality. The orbits are intact bilaterally. Re- demonstration of a new small fluid levels in the bilateral maxillary sinuses with associated mild mucosal thickening in the bilateral maxillary sinuses. Partial opacification of the bilateral ethmoidal air cells. Stable tiny mucous retention cyst versus polyp in the posterior left frontal sinus. Mild mucosal thickening with mild inspissated secretions in the sphenoid sinus. No partial opacification of right mastoid air cells. Unopacified left mastoid air cells. No aggressive appearing focal osseous lesions. The parapharyngeal fat planes are preserved. The nasopharynx, oropharynx and hypopharynx are unremarkable in appearance. The parotid and submandibular glands are within normal limits. No cervical lymphadenopathy is seen. CT CERVICAL SPINE FINDINGS No fracture is detected in the cervical spine. Stable subacute to chronic moderate T3 vertebral compression fracture with approximately 40% loss of vertebral body height, unchanged since 04/17/2015. No prevertebral soft tissue swelling. Normal cervical lordosis. Dens is well positioned between the lateral masses of C1. The lateral masses appear well-aligned. Moderate degenerative disc disease throughout the cervical spine, most prominent at C6-7. Moderate facet arthropathy in the cervical spine, asymmetric to the left. No significant cervical foraminal stenosis. No cervical spine subluxation. New partial opacification of the right mastoid air cells. Visualized left mastoid air cells appear unopacified. No evidence of  intra-axial hemorrhage in the visualized brain. No gross cervical canal hematoma. Apical right upper lobe 4 mm pulmonary nodule (series 12/ image 73), stable since 04/17/2015. No cervical adenopathy or other significant neck soft tissue abnormality. IMPRESSION: 1. Small left frontal scalp contusion, decreased since 04/17/2015. No evidence of acute intracranial abnormality. No calvarial fracture. 2. No maxillofacial fracture. 3. No fracture or subluxation in the cervical spine. 4. Subacute to chronic moderate T3 vertebral compression fracture, stable. 5. Acute paranasal sinusitis, worsened. New partial opacification of the right mastoid air cells, nonspecific, favor a mild infectious or inflammatory right mastoiditis. 6. Diffuse cerebral atrophy, prominent chronic small vessel ischemic white matter change and chronic right basal ganglia lacunar infarct. Electronically Signed   By: Delbert Phenix M.D.   On: 04/25/2015 16:14   Ct Chest W Contrast  04/25/2015  CLINICAL DATA:  Status post fall last night. Pain and bruising. Initial encounter. EXAM: CT CHEST, ABDOMEN, AND PELVIS WITH CONTRAST TECHNIQUE: Multidetector CT imaging of the chest, abdomen and pelvis was performed following the standard protocol during bolus administration of intravenous contrast. CONTRAST:  100 ML OMNIPAQUE IOHEXOL 300 MG/ML  SOLN COMPARISON:  CT abdomen and pelvis 01/12/2015. PA and lateral chest 01/12/2015. MRI abdomen 12/21/2014. FINDINGS: CT CHEST FINDINGS Mediastinum/Lymph Nodes: No evidence of posttraumatic change is identified. There is cardiomegaly. Calcific aortic and coronary atherosclerosis is noted. The patient is status post CABG. Lungs/Pleura: Trace bilateral pleural effusions are seen. There is an area of ground-glass attenuation in the anterior right upper lobe. Dependent atelectasis is noted. Musculoskeletal: Remote T3 and T12 compression fractures are identified. Vertebral body height loss of T6 has progressed since the prior  PA and lateral chest films. No acute fracture is identified. CT ABDOMEN PELVIS FINDINGS Hepatobiliary: A single small gallstone is identified. No evidence of cholecystitis is seen. No biliary ductal dilatation. Pancreas: Multilocular cystic lesion in the  tail the pancreas is unchanged. Spleen: Unremarkable. Adrenals/Urinary Tract: The adrenal glands appear normal. Renal cysts are unchanged. Stomach/Bowel: Surgical anastomosis in the sigmoid colon is noted. The colon is otherwise unremarkable. Stomach and small bowel appear normal. Vascular/Lymphatic: IVC filter is noted. The patient has extensive aortoiliac atherosclerosis without aneurysm. Reproductive: Status post hysterectomy. Other: None. Musculoskeletal: Right hip replacement is seen. Remote pubic ramus fractures are present bilaterally. Lower lumbar spondylosis is noted. IMPRESSION: Negative for trauma to the chest, abdomen or pelvis. Small area of ground-glass attenuation anterior right upper lobe may be due to infectious or inflammatory process. Cardiomegaly. Extensive aortic and coronary atherosclerosis. Single small gallstone without evidence of cholecystitis. No change in a small multilocular cystic lesion in the tail of the pancreas. Remote T3, T6 and T12 compression fractures. Vertebral body height loss of T6 has progressed since the prior examinations. Remote pubic ramus fractures are also noted. Electronically Signed   By: Drusilla Kanner M.D.   On: 04/25/2015 16:03   Ct Cervical Spine Wo Contrast  04/25/2015  CLINICAL DATA:  Fall.  Altered mental status.  Bruises. EXAM: CT HEAD WITHOUT CONTRAST CT MAXILLOFACIAL WITHOUT CONTRAST CT CERVICAL SPINE WITHOUT CONTRAST TECHNIQUE: Multidetector CT imaging of the head, cervical spine, and maxillofacial structures were performed using the standard protocol without intravenous contrast. Multiplanar CT image reconstructions of the cervical spine and maxillofacial structures were also generated. COMPARISON:   04/17/2015 head, cervical spine and maxillofacial CT. FINDINGS: CT HEAD FINDINGS Left frontal scalp contusion is decreased since 04/17/2015. No evidence of parenchymal hemorrhage or extra-axial fluid collection. No mass lesion, mass effect, or midline shift. No CT evidence of acute infarction. Intracranial atherosclerosis. Diffuse cerebral volume loss. Nonspecific prominent stable subcortical and periventricular white matter hypodensity, most in keeping with chronic small vessel ischemic change. Stable small right lentiform nucleus lacunar infarct. No ventriculomegaly. There are new small air-fluid levels in the bilateral maxillary sinuses, with associated mucosal thickening in the bilateral maxillary sinuses. Partial opacification of the bilateral ethmoidal air cells. Stable tiny mucous retention cyst versus polyp in the posterior left frontal sinus. Mild mucosal thickening and inspissated secretions in the sphenoid sinus. New partial opacification of the right mastoid air cells. Left mastoid air cells are unopacified. No evidence of calvarial fracture. CT MAXILLOFACIAL FINDINGS Re- demonstration of small left frontal scalp contusion. No maxillofacial fracture. The maxilla and mandible appear intact. The nasal bones are unremarkable in appearance. There is stable levocurvature of the intact appearing nasal septum. No dislocation at the temporomandibular joints. 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 The visualized dentition demonstrates no acute abnormality. The orbits are intact bilaterally. Re- demonstration of a new small fluid levels in the bilateral maxillary sinuses with associated mild mucosal thickening in the bilateral maxillary sinuses. Partial opacification of the bilateral ethmoidal air cells. Stable tiny mucous retention cyst versus polyp in the posterior left frontal sinus. Mild mucosal thickening with mild inspissated secretions in the sphenoid sinus. No partial opacification of  right mastoid air cells. Unopacified left mastoid air cells. No aggressive appearing focal osseous lesions. The parapharyngeal fat planes are preserved. The nasopharynx, oropharynx and hypopharynx are unremarkable in appearance. The parotid and submandibular glands are within normal limits. No cervical lymphadenopathy is seen. CT CERVICAL SPINE FINDINGS No fracture is detected in the cervical spine. Stable subacute to chronic moderate T3 vertebral compression fracture with approximately 40% loss of vertebral body height, unchanged since 04/17/2015. No prevertebral soft tissue swelling. Normal cervical lordosis. Dens is well positioned between the lateral masses  of C1. The lateral masses appear well-aligned. Moderate degenerative disc disease throughout the cervical spine, most prominent at C6-7. Moderate facet arthropathy in the cervical spine, asymmetric to the left. No significant cervical foraminal stenosis. No cervical spine subluxation. New partial opacification of the right mastoid air cells. Visualized left mastoid air cells appear unopacified. No evidence of intra-axial hemorrhage in the visualized brain. No gross cervical canal hematoma. Apical right upper lobe 4 mm pulmonary nodule (series 12/ image 73), stable since 04/17/2015. No cervical adenopathy or other significant neck soft tissue abnormality. IMPRESSION: 1. Small left frontal scalp contusion, decreased since 04/17/2015. No evidence of acute intracranial abnormality. No calvarial fracture. 2. No maxillofacial fracture. 3. No fracture or subluxation in the cervical spine. 4. Subacute to chronic moderate T3 vertebral compression fracture, stable. 5. Acute paranasal sinusitis, worsened. New partial opacification of the right mastoid air cells, nonspecific, favor a mild infectious or inflammatory right mastoiditis. 6. Diffuse cerebral atrophy, prominent chronic small vessel ischemic white matter change and chronic right basal ganglia lacunar infarct.  Electronically Signed   By: Delbert Phenix M.D.   On: 04/25/2015 16:14   Ct Abdomen Pelvis W Contrast  04/25/2015  CLINICAL DATA:  Status post fall last night. Pain and bruising. Initial encounter. EXAM: CT CHEST, ABDOMEN, AND PELVIS WITH CONTRAST TECHNIQUE: Multidetector CT imaging of the chest, abdomen and pelvis was performed following the standard protocol during bolus administration of intravenous contrast. CONTRAST:  100 ML OMNIPAQUE IOHEXOL 300 MG/ML  SOLN COMPARISON:  CT abdomen and pelvis 01/12/2015. PA and lateral chest 01/12/2015. MRI abdomen 12/21/2014. FINDINGS: CT CHEST FINDINGS Mediastinum/Lymph Nodes: No evidence of posttraumatic change is identified. There is cardiomegaly. Calcific aortic and coronary atherosclerosis is noted. The patient is status post CABG. Lungs/Pleura: Trace bilateral pleural effusions are seen. There is an area of ground-glass attenuation in the anterior right upper lobe. Dependent atelectasis is noted. Musculoskeletal: Remote T3 and T12 compression fractures are identified. Vertebral body height loss of T6 has progressed since the prior PA and lateral chest films. No acute fracture is identified. CT ABDOMEN PELVIS FINDINGS Hepatobiliary: A single small gallstone is identified. No evidence of cholecystitis is seen. No biliary ductal dilatation. Pancreas: Multilocular cystic lesion in the tail the pancreas is unchanged. Spleen: Unremarkable. Adrenals/Urinary Tract: The adrenal glands appear normal. Renal cysts are unchanged. Stomach/Bowel: Surgical anastomosis in the sigmoid colon is noted. The colon is otherwise unremarkable. Stomach and small bowel appear normal. Vascular/Lymphatic: IVC filter is noted. The patient has extensive aortoiliac atherosclerosis without aneurysm. Reproductive: Status post hysterectomy. Other: None. Musculoskeletal: Right hip replacement is seen. Remote pubic ramus fractures are present bilaterally. Lower lumbar spondylosis is noted. IMPRESSION:  Negative for trauma to the chest, abdomen or pelvis. Small area of ground-glass attenuation anterior right upper lobe may be due to infectious or inflammatory process. Cardiomegaly. Extensive aortic and coronary atherosclerosis. Single small gallstone without evidence of cholecystitis. No change in a small multilocular cystic lesion in the tail of the pancreas. Remote T3, T6 and T12 compression fractures. Vertebral body height loss of T6 has progressed since the prior examinations. Remote pubic ramus fractures are also noted. Electronically Signed   By: Drusilla Kanner M.D.   On: 04/25/2015 16:03   Ct Maxillofacial Wo Cm  04/25/2015  CLINICAL DATA:  Fall.  Altered mental status.  Bruises. EXAM: CT HEAD WITHOUT CONTRAST CT MAXILLOFACIAL WITHOUT CONTRAST CT CERVICAL SPINE WITHOUT CONTRAST TECHNIQUE: Multidetector CT imaging of the head, cervical spine, and maxillofacial structures were performed using the  standard protocol without intravenous contrast. Multiplanar CT image reconstructions of the cervical spine and maxillofacial structures were also generated. COMPARISON:  04/17/2015 head, cervical spine and maxillofacial CT. FINDINGS: CT HEAD FINDINGS Left frontal scalp contusion is decreased since 04/17/2015. No evidence of parenchymal hemorrhage or extra-axial fluid collection. No mass lesion, mass effect, or midline shift. No CT evidence of acute infarction. Intracranial atherosclerosis. Diffuse cerebral volume loss. Nonspecific prominent stable subcortical and periventricular white matter hypodensity, most in keeping with chronic small vessel ischemic change. Stable small right lentiform nucleus lacunar infarct. No ventriculomegaly. There are new small air-fluid levels in the bilateral maxillary sinuses, with associated mucosal thickening in the bilateral maxillary sinuses. Partial opacification of the bilateral ethmoidal air cells. Stable tiny mucous retention cyst versus polyp in the posterior left frontal  sinus. Mild mucosal thickening and inspissated secretions in the sphenoid sinus. New partial opacification of the right mastoid air cells. Left mastoid air cells are unopacified. No evidence of calvarial fracture. CT MAXILLOFACIAL FINDINGS Re- demonstration of small left frontal scalp contusion. No maxillofacial fracture. The maxilla and mandible appear intact. The nasal bones are unremarkable in appearance. There is stable levocurvature of the intact appearing nasal septum. No dislocation at the temporomandibular joints. 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 The visualized dentition demonstrates no acute abnormality. The orbits are intact bilaterally. Re- demonstration of a new small fluid levels in the bilateral maxillary sinuses with associated mild mucosal thickening in the bilateral maxillary sinuses. Partial opacification of the bilateral ethmoidal air cells. Stable tiny mucous retention cyst versus polyp in the posterior left frontal sinus. Mild mucosal thickening with mild inspissated secretions in the sphenoid sinus. No partial opacification of right mastoid air cells. Unopacified left mastoid air cells. No aggressive appearing focal osseous lesions. The parapharyngeal fat planes are preserved. The nasopharynx, oropharynx and hypopharynx are unremarkable in appearance. The parotid and submandibular glands are within normal limits. No cervical lymphadenopathy is seen. CT CERVICAL SPINE FINDINGS No fracture is detected in the cervical spine. Stable subacute to chronic moderate T3 vertebral compression fracture with approximately 40% loss of vertebral body height, unchanged since 04/17/2015. No prevertebral soft tissue swelling. Normal cervical lordosis. Dens is well positioned between the lateral masses of C1. The lateral masses appear well-aligned. Moderate degenerative disc disease throughout the cervical spine, most prominent at C6-7. Moderate facet arthropathy in the cervical spine,  asymmetric to the left. No significant cervical foraminal stenosis. No cervical spine subluxation. New partial opacification of the right mastoid air cells. Visualized left mastoid air cells appear unopacified. No evidence of intra-axial hemorrhage in the visualized brain. No gross cervical canal hematoma. Apical right upper lobe 4 mm pulmonary nodule (series 12/ image 73), stable since 04/17/2015. No cervical adenopathy or other significant neck soft tissue abnormality. IMPRESSION: 1. Small left frontal scalp contusion, decreased since 04/17/2015. No evidence of acute intracranial abnormality. No calvarial fracture. 2. No maxillofacial fracture. 3. No fracture or subluxation in the cervical spine. 4. Subacute to chronic moderate T3 vertebral compression fracture, stable. 5. Acute paranasal sinusitis, worsened. New partial opacification of the right mastoid air cells, nonspecific, favor a mild infectious or inflammatory right mastoiditis. 6. Diffuse cerebral atrophy, prominent chronic small vessel ischemic white matter change and chronic right basal ganglia lacunar infarct. Electronically Signed   By: Delbert Phenix M.D.   On: 04/25/2015 16:14   I have personally reviewed and evaluated these images and lab results as part of my medical decision-making.   EKG Interpretation None  MDM   Final diagnoses:  Fall, initial encounter  URI (upper respiratory infection)    Pt is an 82 y.o. female who normally lives at home, currently residing at [facility] as her son is in [place] for his own medical issues. Pt sent to the ED by PCP for evaluation of pain and possible confusion after a fall. Pt reports she fell last night in addition to documented fall from last week. Surprisingly she has been a&o in the ED and acting appropriately. Her labs overall appear to be at baseline. However, given diffuse pain we pan-scanned her. CT chest shows small area of ground glass attenuation in right upper lobe, possibly  infectious. Given pt's report of cough with this finding on CT, pt's age, possible AMS, dementia, will call hospitalists for admission   Spoke to Dr. Butler Denmark for hospitalist admission. Will obtain orthostatic VS prior to transfer. I spoke to pt's assisted living facility and she did receive all of her morning medications, including amiodarone.   Carlene Coria, PA-C 04/26/15 1456  Rolland Porter, MD 05/07/15 [phone]

## 2015-04-25 NOTE — Progress Notes (Signed)
Called for direct admit. 82 y/o female who is currently at an assisted living while son is out of town. She is confused, has a cough and possibly a lung infiltrate on CT chest. She has been falling frequently. BUN/Cr ratio elevated. I have asked ER to obtain orthostatics and have her placed under observation on a med/surg bed.   Calvert CantorSaima Hyden Soley, MD

## 2015-04-25 NOTE — Progress Notes (Signed)
[facility]. Patient AOx3, VS stable, pain at 2/10 if not moving.  Patient oriented to room ,bed control and call light.  Paged admitting MD of admission.

## 2015-04-26 DIAGNOSIS — Z9181 History of falling: Secondary | ICD-10-CM | POA: Diagnosis not present

## 2015-04-26 DIAGNOSIS — J189 Pneumonia, unspecified organism: Secondary | ICD-10-CM | POA: Diagnosis not present

## 2015-04-26 DIAGNOSIS — Z87891 Personal history of nicotine dependence: Secondary | ICD-10-CM | POA: Diagnosis not present

## 2015-04-26 DIAGNOSIS — Z951 Presence of aortocoronary bypass graft: Secondary | ICD-10-CM | POA: Diagnosis not present

## 2015-04-26 DIAGNOSIS — I480 Paroxysmal atrial fibrillation: Secondary | ICD-10-CM

## 2015-04-26 DIAGNOSIS — S40022A Contusion of left upper arm, initial encounter: Secondary | ICD-10-CM | POA: Diagnosis present

## 2015-04-26 DIAGNOSIS — F0390 Unspecified dementia without behavioral disturbance: Secondary | ICD-10-CM | POA: Diagnosis present

## 2015-04-26 DIAGNOSIS — Z7982 Long term (current) use of aspirin: Secondary | ICD-10-CM | POA: Diagnosis not present

## 2015-04-26 DIAGNOSIS — S0003XA Contusion of scalp, initial encounter: Secondary | ICD-10-CM | POA: Diagnosis present

## 2015-04-26 DIAGNOSIS — E785 Hyperlipidemia, unspecified: Secondary | ICD-10-CM | POA: Diagnosis present

## 2015-04-26 DIAGNOSIS — R1310 Dysphagia, unspecified: Secondary | ICD-10-CM | POA: Diagnosis present

## 2015-04-26 DIAGNOSIS — S1093XA Contusion of unspecified part of neck, initial encounter: Secondary | ICD-10-CM | POA: Diagnosis present

## 2015-04-26 DIAGNOSIS — W19XXXD Unspecified fall, subsequent encounter: Secondary | ICD-10-CM | POA: Diagnosis not present

## 2015-04-26 DIAGNOSIS — K5900 Constipation, unspecified: Secondary | ICD-10-CM | POA: Diagnosis present

## 2015-04-26 DIAGNOSIS — I10 Essential (primary) hypertension: Secondary | ICD-10-CM | POA: Diagnosis not present

## 2015-04-26 DIAGNOSIS — Z66 Do not resuscitate: Secondary | ICD-10-CM | POA: Diagnosis present

## 2015-04-26 DIAGNOSIS — Z7951 Long term (current) use of inhaled steroids: Secondary | ICD-10-CM | POA: Diagnosis not present

## 2015-04-26 DIAGNOSIS — I2510 Atherosclerotic heart disease of native coronary artery without angina pectoris: Secondary | ICD-10-CM | POA: Diagnosis present

## 2015-04-26 DIAGNOSIS — M810 Age-related osteoporosis without current pathological fracture: Secondary | ICD-10-CM | POA: Diagnosis present

## 2015-04-26 DIAGNOSIS — K219 Gastro-esophageal reflux disease without esophagitis: Secondary | ICD-10-CM | POA: Diagnosis present

## 2015-04-26 DIAGNOSIS — J069 Acute upper respiratory infection, unspecified: Secondary | ICD-10-CM | POA: Diagnosis present

## 2015-04-26 DIAGNOSIS — S40021A Contusion of right upper arm, initial encounter: Secondary | ICD-10-CM | POA: Diagnosis present

## 2015-04-26 DIAGNOSIS — W010XXA Fall on same level from slipping, tripping and stumbling without subsequent striking against object, initial encounter: Secondary | ICD-10-CM | POA: Diagnosis present

## 2015-04-26 DIAGNOSIS — S0083XA Contusion of other part of head, initial encounter: Secondary | ICD-10-CM | POA: Diagnosis present

## 2015-04-26 LAB — STREP PNEUMONIAE URINARY ANTIGEN: STREP PNEUMO URINARY ANTIGEN: NEGATIVE

## 2015-04-26 LAB — HIV ANTIBODY (ROUTINE TESTING W REFLEX): HIV Screen 4th Generation wRfx: NONREACTIVE

## 2015-04-26 LAB — MRSA PCR SCREENING: MRSA BY PCR: NEGATIVE

## 2015-04-26 MED ORDER — GUAIFENESIN ER 600 MG PO TB12
600.0000 mg | ORAL_TABLET | Freq: Two times a day (BID) | ORAL | Status: DC
  Administered 2015-04-26 – 2015-04-30 (×9): 600 mg via ORAL
  Filled 2015-04-26 (×9): qty 1

## 2015-04-26 MED ORDER — LATANOPROST 0.005 % OP SOLN
1.0000 [drp] | Freq: Every day | OPHTHALMIC | Status: DC
  Administered 2015-04-26 – 2015-04-29 (×4): 1 [drp] via OPHTHALMIC
  Filled 2015-04-26: qty 2.5

## 2015-04-26 MED ORDER — RESOURCE THICKENUP CLEAR PO POWD
ORAL | Status: DC | PRN
  Filled 2015-04-26: qty 125

## 2015-04-26 MED ORDER — ACETAMINOPHEN 325 MG PO TABS
650.0000 mg | ORAL_TABLET | Freq: Four times a day (QID) | ORAL | Status: DC | PRN
  Administered 2015-04-26 – 2015-04-27 (×4): 650 mg via ORAL
  Filled 2015-04-26 (×6): qty 2

## 2015-04-26 NOTE — Progress Notes (Signed)
Received patient [facility].  Patient AOx3 but sometimes confused.  VS stable, O2Sat at 94% on RA, pain at 2/10, oriented to room, bed controls and call light.  Patient resting on bed bed comfortably sipping some chicken broth.

## 2015-04-26 NOTE — Evaluation (Signed)
Physical Therapy Evaluation Patient Details Name: Jacqueline [name] MRN: [id number] DOB: [birth year] Today's Date: 04/26/2015   History of Present Illness  Jacqueline [name] is a 82 y.o. female with a diagnosis of community acquired pneumonia. Pt has PMH of dementia, coronary artery disease status post CABG 5, remote paroxysmal A. fib followed by cardiology on no anticoagulation, hypertension, hyperlipidemia, GERD, osteoporosis, diverticulitis, compression fracture T12, who presents with a generalized weakness and increased urinary frequency.  Clinical Impression    Pt admitted on 04/25/15 due to a fall in her SNF and c/o intense abdominal pain. Prior to living in SNF, Pt lived with her son and his family in their home. Pt reports ambulating with modified independence by using a RW. Pt is now +2 mod assist to go from sit to stand. Pt is +2 max assist to transfer from EOB to recliner. Pt demonstrates some cognitive impairment and memory issues. Pt is most limited by abdominal pain.  PT to follow acutely for deficits listed below.     Follow Up Recommendations Other (comment) (ALF)          Precautions / Restrictions Precautions Precautions: Fall Restrictions Weight Bearing Restrictions: No      Mobility  Bed Mobility Overal bed mobility: Needs Assistance Bed Mobility: Supine to Sit;Sit to Supine     Supine to sit: Mod assist;HOB elevated Sit to supine: HOB elevated;Mod assist   General bed mobility comments: Pt most limited by pain in abdomen when moving in the bed. Pt responded well to verbal cues to properly move in bed.   Transfers Overall transfer level: Needs assistance Equipment used: Rolling walker (2 wheeled) Transfers: Sit to/from Stand Sit to Stand: Mod assist;+2 physical assistance         General transfer comment: Pt required a RW for bilateral UE support and +2 to transfer from EOB to recliner. Pt takes very small steps, and reports being very painful. Pt  is kyphotic, but is able to maintain erect posture for short periods of time in standing.         Balance Overall balance assessment: Needs assistance Sitting-balance support: Feet supported;No upper extremity supported Sitting balance-Leahy Scale: Fair Sitting balance - Comments: Pt is able to maintain sitting balance in the bed and chair without UE support, but Pt does not have trunk strength to hold herself upright. Pt tends to maintain a slumped position in the bed.    Standing balance support: Bilateral upper extremity supported Standing balance-Leahy Scale: Poor Standing balance comment: With +2 mod assist, Pt is able to maintain static standing balance.                             Pertinent Vitals/Pain Pain Assessment: Faces Pain Score: 10-Worst pain ever Faces Pain Scale: Hurts little more Pain Location: abdomen Pain Descriptors / Indicators: Grimacing Pain Intervention(s): Limited activity within patient's tolerance;Monitored during session;Repositioned    Home Living Family/patient expects to be discharged to:: Private residence (in SNF while family gone; unsure of desired d/c location) Living Arrangements: Other (Comment) (but was in ALF due to children being out of town for sx) Available Help at Discharge: Family;Available PRN/intermittently           Home Equipment: Walker - 2 wheels Additional Comments: Unable to get full description of equipment Pt has in the home. Pt was distracted by her high pain level throughout the session, and somewhat disorientated (Pt [facility] ). However, Pt was clear headed for portions of the session and was able to describe her situation of recently living in the ALF.     Prior Function Level of Independence: Needs assistance   Gait / Transfers Assistance Needed: Independently used 2-wheeled RW     Comments: Pt reports living with her son. Pt was put in a ALF due to her son having a  procedure out of town. Pt fell while in ALF. Her level of assistance was greater when in the ALF, but prior to her fall she was able to ambulate with modified independence using a RW.        Extremity/Trunk Assessment                      Cervical / Trunk Assessment: Kyphotic (especially pronounced in standing)  Communication   Communication: No difficulties  Cognition Arousal/Alertness: Awake/alert Behavior During Therapy: WFL for tasks assessed/performed Overall Cognitive Status: History of cognitive impairments - at baseline                      General Comments General comments (skin integrity, edema, etc.): Pt reports SpO2 was monitored throughout the session, and even following activity, her SpO2 never dropped below 94% on room air.          Assessment/Plan    PT Assessment Patient needs continued PT services  PT Diagnosis Generalized weakness;Acute pain   PT Problem List Decreased strength;Decreased activity tolerance;Decreased balance;Decreased mobility;Decreased knowledge of use of DME;Pain  PT Treatment Interventions Therapeutic activities;Therapeutic exercise;Balance training;DME instruction;Gait training;Patient/family education   PT Goals (Current goals can be found in the Care Plan section) Acute Rehab PT Goals Patient Stated Goal: Pt did not state a goal Time For Goal Achievement: 05/10/15 Potential to Achieve Goals: Fair    Frequency Min 3X/week   Barriers to discharge   Unsure of her son's ability to help following her d/c from hospital. Likely will need to return to ALF until family is back in town and able to help.        End of Session   Activity Tolerance: Patient limited by fatigue;Patient limited by pain Patient left: in chair;with call bell/phone within reach;with chair alarm set Nurse Communication: Patient requests pain meds         Time: 1121-1201 PT Time Calculation (min) (ACUTE ONLY): 40 min   Charges: 1 mod eval, 2  therapeutic activity        PT G CodesChase Picket:       Shadee Rathod, [place] [phone] office [place] Henriette Hesser 04/26/2015, 3:19 PM

## 2015-04-26 NOTE — Assessment & Plan Note (Signed)
Pt w/ multiple falls recently- this is 2nd fall that has required medical attention.  Pt is in severe pain and confused when compared to baseline.  She is severely bruised, her abd is distended and she is not able to be repositioned w/o great discomfort.  I do not feel that she is able to return to an Assisted Living environment on pain medication b/c that would put her at higher risk for falling and she would not have enough supervision.  She most likely needs admission for pain control and her abdominal distention needs to be worked up.  Called and spoke w/ EDP who agreed she needs assessment for confusion.  Pt will be transported to ER via wheelchair by RN.

## 2015-04-26 NOTE — Progress Notes (Addendum)
[facility] PROGRESS NOTE  Jacqueline [name] ZOX:[id number] DOB: 1928/12/09 DOA: 04/25/2015 PCP: Neena RhymesKatherine Tabori, MD   HPI: Jacqueline [name] is a 82 y.o. female with h/o mild dementia, CABG, HTN, A.Fib on amiodarone but not on anticoagulants due to fall risk , resident of ALF. Patient is sent in to the ED today with new onset of confusion per her PCP, also has cough for week  Assessment/Plan: 1. Community acquired pneumonia      - small area of ground glass attenuation in RUL, wet sounding cough/congestion      -continue rocephin/zithromax, add mucinex      -also get SLP evaluation      -FU sputum, blood Cx, FU Flu PCR  2. PAF      - rate controlled, continue amiodarone, no anticoagulants due to fall risk      -continue ASA  3. HTN       - BP on higher side, continue amlodipine, lisinopril  4. Dementia      -mild, stable  5. Falls        -ambulate, check orthostatics, Pt/OT eval  6. HLD - continue lipitor  DVT proph: lovenox  Code Status: DNR Family Communication: none at bedside Disposition Plan: to be determined   HPI/Subjective: Feels weak, productive cough  Objective: Filed Vitals:   04/26/15 0501 04/26/15 1135  BP: 166/91   Pulse: 73 76  Temp: 97.9 F (36.6 C)   Resp: 20     Intake/Output Summary (Last 24 hours) at 04/26/15 1155 Last data filed at 04/26/15 0900  Gross per 24 hour  Intake 1444.08 ml  Output   1925 ml  Net -480.92 ml   Filed Weights   04/25/15 1412 04/25/15 2057  Weight: 48.081 kg (106 lb) 51.574 kg (113 lb 11.2 oz)    Exam:   General:  Alert, awake, oriented to self and place, bruises all over, arms, face, under eyes, neck  Cardiovascular: S1S2/RRR  Respiratory: ronchi on R  Abdomen: soft, Nt, BS present  Musculoskeletal: no edema    Data Reviewed: Basic Metabolic Panel:  Recent Labs Lab 04/25/15 1430  NA 142  K 3.2*  CL 108  CO2 28  GLUCOSE 149*  BUN 28*  CREATININE 0.64  CALCIUM 8.8*    Liver Function Tests:  Recent Labs Lab 04/25/15 1430  AST 112*  ALT 143*  ALKPHOS 73  BILITOT 0.6  PROT 6.3*  ALBUMIN 2.7*    Recent Labs Lab 04/25/15 1430  LIPASE 53*   No results for input(s): AMMONIA in the last 168 hours. CBC:  Recent Labs Lab 04/25/15 1430  WBC 9.5  NEUTROABS 8.3*  HGB 11.8*  HCT 37.0  MCV 103.4*  PLT 230   Cardiac Enzymes:  Recent Labs Lab 04/25/15 1430  TROPONINI <0.03   BNP (last 3 results)  Recent Labs  07/19/14 1818  BNP 191.0*    ProBNP (last 3 results) No results for input(s): PROBNP in the last 8760 hours.  CBG:  Recent Labs Lab 04/25/15 1451  GLUCAP 140*    Recent Results (from the past 240 hour(s))  MRSA PCR Screening     Status: None   Collection Time: 04/26/15  5:17 AM  Result Value Ref Range Status   MRSA by PCR NEGATIVE NEGATIVE Final    Comment:        The GeneXpert MRSA Assay (FDA approved for NASAL specimens only), is one component of a comprehensive MRSA colonization surveillance program. It is not intended to  diagnose MRSA infection nor to guide or monitor treatment for MRSA infections.      Studies: Ct Head Wo Contrast  04/25/2015  CLINICAL DATA:  Fall.  Altered mental status.  Bruises. EXAM: CT HEAD WITHOUT CONTRAST CT MAXILLOFACIAL WITHOUT CONTRAST CT CERVICAL SPINE WITHOUT CONTRAST TECHNIQUE: Multidetector CT imaging of the head, cervical spine, and maxillofacial structures were performed using the standard protocol without intravenous contrast. Multiplanar CT image reconstructions of the cervical spine and maxillofacial structures were also generated. COMPARISON:  04/17/2015 head, cervical spine and maxillofacial CT. FINDINGS: CT HEAD FINDINGS Left frontal scalp contusion is decreased since 04/17/2015. No evidence of parenchymal hemorrhage or extra-axial fluid collection. No mass lesion, mass effect, or midline shift. No CT evidence of acute infarction. Intracranial atherosclerosis. Diffuse  cerebral volume loss. Nonspecific prominent stable subcortical and periventricular white matter hypodensity, most in keeping with chronic small vessel ischemic change. Stable small right lentiform nucleus lacunar infarct. No ventriculomegaly. There are new small air-fluid levels in the bilateral maxillary sinuses, with associated mucosal thickening in the bilateral maxillary sinuses. Partial opacification of the bilateral ethmoidal air cells. Stable tiny mucous retention cyst versus polyp in the posterior left frontal sinus. Mild mucosal thickening and inspissated secretions in the sphenoid sinus. New partial opacification of the right mastoid air cells. Left mastoid air cells are unopacified. No evidence of calvarial fracture. CT MAXILLOFACIAL FINDINGS Re- demonstration of small left frontal scalp contusion. No maxillofacial fracture. The maxilla and mandible appear intact. The nasal bones are unremarkable in appearance. There is stable levocurvature of the intact appearing nasal septum. No dislocation at the temporomandibular joints. 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 The visualized dentition demonstrates no acute abnormality. The orbits are intact bilaterally. Re- demonstration of a new small fluid levels in the bilateral maxillary sinuses with associated mild mucosal thickening in the bilateral maxillary sinuses. Partial opacification of the bilateral ethmoidal air cells. Stable tiny mucous retention cyst versus polyp in the posterior left frontal sinus. Mild mucosal thickening with mild inspissated secretions in the sphenoid sinus. No partial opacification of right mastoid air cells. Unopacified left mastoid air cells. No aggressive appearing focal osseous lesions. The parapharyngeal fat planes are preserved. The nasopharynx, oropharynx and hypopharynx are unremarkable in appearance. The parotid and submandibular glands are within normal limits. No cervical lymphadenopathy is seen. CT  CERVICAL SPINE FINDINGS No fracture is detected in the cervical spine. Stable subacute to chronic moderate T3 vertebral compression fracture with approximately 40% loss of vertebral body height, unchanged since 04/17/2015. No prevertebral soft tissue swelling. Normal cervical lordosis. Dens is well positioned between the lateral masses of C1. The lateral masses appear well-aligned. Moderate degenerative disc disease throughout the cervical spine, most prominent at C6-7. Moderate facet arthropathy in the cervical spine, asymmetric to the left. No significant cervical foraminal stenosis. No cervical spine subluxation. New partial opacification of the right mastoid air cells. Visualized left mastoid air cells appear unopacified. No evidence of intra-axial hemorrhage in the visualized brain. No gross cervical canal hematoma. Apical right upper lobe 4 mm pulmonary nodule (series 12/ image 73), stable since 04/17/2015. No cervical adenopathy or other significant neck soft tissue abnormality. IMPRESSION: 1. Small left frontal scalp contusion, decreased since 04/17/2015. No evidence of acute intracranial abnormality. No calvarial fracture. 2. No maxillofacial fracture. 3. No fracture or subluxation in the cervical spine. 4. Subacute to chronic moderate T3 vertebral compression fracture, stable. 5. Acute paranasal sinusitis, worsened. New partial opacification of the right mastoid air cells, nonspecific, favor a mild  infectious or inflammatory right mastoiditis. 6. Diffuse cerebral atrophy, prominent chronic small vessel ischemic white matter change and chronic right basal ganglia lacunar infarct. Electronically Signed   By: Delbert Phenix M.D.   On: 04/25/2015 16:14   Ct Chest W Contrast  04/25/2015  CLINICAL DATA:  Status post fall last night. Pain and bruising. Initial encounter. EXAM: CT CHEST, ABDOMEN, AND PELVIS WITH CONTRAST TECHNIQUE: Multidetector CT imaging of the chest, abdomen and pelvis was performed following  the standard protocol during bolus administration of intravenous contrast. CONTRAST:  100 ML OMNIPAQUE IOHEXOL 300 MG/ML  SOLN COMPARISON:  CT abdomen and pelvis 01/12/2015. PA and lateral chest 01/12/2015. MRI abdomen 12/21/2014. FINDINGS: CT CHEST FINDINGS Mediastinum/Lymph Nodes: No evidence of posttraumatic change is identified. There is cardiomegaly. Calcific aortic and coronary atherosclerosis is noted. The patient is status post CABG. Lungs/Pleura: Trace bilateral pleural effusions are seen. There is an area of ground-glass attenuation in the anterior right upper lobe. Dependent atelectasis is noted. Musculoskeletal: Remote T3 and T12 compression fractures are identified. Vertebral body height loss of T6 has progressed since the prior PA and lateral chest films. No acute fracture is identified. CT ABDOMEN PELVIS FINDINGS Hepatobiliary: A single small gallstone is identified. No evidence of cholecystitis is seen. No biliary ductal dilatation. Pancreas: Multilocular cystic lesion in the tail the pancreas is unchanged. Spleen: Unremarkable. Adrenals/Urinary Tract: The adrenal glands appear normal. Renal cysts are unchanged. Stomach/Bowel: Surgical anastomosis in the sigmoid colon is noted. The colon is otherwise unremarkable. Stomach and small bowel appear normal. Vascular/Lymphatic: IVC filter is noted. The patient has extensive aortoiliac atherosclerosis without aneurysm. Reproductive: Status post hysterectomy. Other: None. Musculoskeletal: Right hip replacement is seen. Remote pubic ramus fractures are present bilaterally. Lower lumbar spondylosis is noted. IMPRESSION: Negative for trauma to the chest, abdomen or pelvis. Small area of ground-glass attenuation anterior right upper lobe may be due to infectious or inflammatory process. Cardiomegaly. Extensive aortic and coronary atherosclerosis. Single small gallstone without evidence of cholecystitis. No change in a small multilocular cystic lesion in the tail  of the pancreas. Remote T3, T6 and T12 compression fractures. Vertebral body height loss of T6 has progressed since the prior examinations. Remote pubic ramus fractures are also noted. Electronically Signed   By: Drusilla Kanner M.D.   On: 04/25/2015 16:03   Ct Cervical Spine Wo Contrast  04/25/2015  CLINICAL DATA:  Fall.  Altered mental status.  Bruises. EXAM: CT HEAD WITHOUT CONTRAST CT MAXILLOFACIAL WITHOUT CONTRAST CT CERVICAL SPINE WITHOUT CONTRAST TECHNIQUE: Multidetector CT imaging of the head, cervical spine, and maxillofacial structures were performed using the standard protocol without intravenous contrast. Multiplanar CT image reconstructions of the cervical spine and maxillofacial structures were also generated. COMPARISON:  04/17/2015 head, cervical spine and maxillofacial CT. FINDINGS: CT HEAD FINDINGS Left frontal scalp contusion is decreased since 04/17/2015. No evidence of parenchymal hemorrhage or extra-axial fluid collection. No mass lesion, mass effect, or midline shift. No CT evidence of acute infarction. Intracranial atherosclerosis. Diffuse cerebral volume loss. Nonspecific prominent stable subcortical and periventricular white matter hypodensity, most in keeping with chronic small vessel ischemic change. Stable small right lentiform nucleus lacunar infarct. No ventriculomegaly. There are new small air-fluid levels in the bilateral maxillary sinuses, with associated mucosal thickening in the bilateral maxillary sinuses. Partial opacification of the bilateral ethmoidal air cells. Stable tiny mucous retention cyst versus polyp in the posterior left frontal sinus. Mild mucosal thickening and inspissated secretions in the sphenoid sinus. New partial opacification of the right mastoid  air cells. Left mastoid air cells are unopacified. No evidence of calvarial fracture. CT MAXILLOFACIAL FINDINGS Re- demonstration of small left frontal scalp contusion. No maxillofacial fracture. The maxilla and  mandible appear intact. The nasal bones are unremarkable in appearance. There is stable levocurvature of the intact appearing nasal septum. No dislocation at the temporomandibular joints. 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 The visualized dentition demonstrates no acute abnormality. The orbits are intact bilaterally. Re- demonstration of a new small fluid levels in the bilateral maxillary sinuses with associated mild mucosal thickening in the bilateral maxillary sinuses. Partial opacification of the bilateral ethmoidal air cells. Stable tiny mucous retention cyst versus polyp in the posterior left frontal sinus. Mild mucosal thickening with mild inspissated secretions in the sphenoid sinus. No partial opacification of right mastoid air cells. Unopacified left mastoid air cells. No aggressive appearing focal osseous lesions. The parapharyngeal fat planes are preserved. The nasopharynx, oropharynx and hypopharynx are unremarkable in appearance. The parotid and submandibular glands are within normal limits. No cervical lymphadenopathy is seen. CT CERVICAL SPINE FINDINGS No fracture is detected in the cervical spine. Stable subacute to chronic moderate T3 vertebral compression fracture with approximately 40% loss of vertebral body height, unchanged since 04/17/2015. No prevertebral soft tissue swelling. Normal cervical lordosis. Dens is well positioned between the lateral masses of C1. The lateral masses appear well-aligned. Moderate degenerative disc disease throughout the cervical spine, most prominent at C6-7. Moderate facet arthropathy in the cervical spine, asymmetric to the left. No significant cervical foraminal stenosis. No cervical spine subluxation. New partial opacification of the right mastoid air cells. Visualized left mastoid air cells appear unopacified. No evidence of intra-axial hemorrhage in the visualized brain. No gross cervical canal hematoma. Apical right upper lobe 4 mm  pulmonary nodule (series 12/ image 73), stable since 04/17/2015. No cervical adenopathy or other significant neck soft tissue abnormality. IMPRESSION: 1. Small left frontal scalp contusion, decreased since 04/17/2015. No evidence of acute intracranial abnormality. No calvarial fracture. 2. No maxillofacial fracture. 3. No fracture or subluxation in the cervical spine. 4. Subacute to chronic moderate T3 vertebral compression fracture, stable. 5. Acute paranasal sinusitis, worsened. New partial opacification of the right mastoid air cells, nonspecific, favor a mild infectious or inflammatory right mastoiditis. 6. Diffuse cerebral atrophy, prominent chronic small vessel ischemic white matter change and chronic right basal ganglia lacunar infarct. Electronically Signed   By: Delbert Phenix M.D.   On: 04/25/2015 16:14   Ct Abdomen Pelvis W Contrast  04/25/2015  CLINICAL DATA:  Status post fall last night. Pain and bruising. Initial encounter. EXAM: CT CHEST, ABDOMEN, AND PELVIS WITH CONTRAST TECHNIQUE: Multidetector CT imaging of the chest, abdomen and pelvis was performed following the standard protocol during bolus administration of intravenous contrast. CONTRAST:  100 ML OMNIPAQUE IOHEXOL 300 MG/ML  SOLN COMPARISON:  CT abdomen and pelvis 01/12/2015. PA and lateral chest 01/12/2015. MRI abdomen 12/21/2014. FINDINGS: CT CHEST FINDINGS Mediastinum/Lymph Nodes: No evidence of posttraumatic change is identified. There is cardiomegaly. Calcific aortic and coronary atherosclerosis is noted. The patient is status post CABG. Lungs/Pleura: Trace bilateral pleural effusions are seen. There is an area of ground-glass attenuation in the anterior right upper lobe. Dependent atelectasis is noted. Musculoskeletal: Remote T3 and T12 compression fractures are identified. Vertebral body height loss of T6 has progressed since the prior PA and lateral chest films. No acute fracture is identified. CT ABDOMEN PELVIS FINDINGS  Hepatobiliary: A single small gallstone is identified. No evidence of cholecystitis is seen. No biliary  ductal dilatation. Pancreas: Multilocular cystic lesion in the tail the pancreas is unchanged. Spleen: Unremarkable. Adrenals/Urinary Tract: The adrenal glands appear normal. Renal cysts are unchanged. Stomach/Bowel: Surgical anastomosis in the sigmoid colon is noted. The colon is otherwise unremarkable. Stomach and small bowel appear normal. Vascular/Lymphatic: IVC filter is noted. The patient has extensive aortoiliac atherosclerosis without aneurysm. Reproductive: Status post hysterectomy. Other: None. Musculoskeletal: Right hip replacement is seen. Remote pubic ramus fractures are present bilaterally. Lower lumbar spondylosis is noted. IMPRESSION: Negative for trauma to the chest, abdomen or pelvis. Small area of ground-glass attenuation anterior right upper lobe may be due to infectious or inflammatory process. Cardiomegaly. Extensive aortic and coronary atherosclerosis. Single small gallstone without evidence of cholecystitis. No change in a small multilocular cystic lesion in the tail of the pancreas. Remote T3, T6 and T12 compression fractures. Vertebral body height loss of T6 has progressed since the prior examinations. Remote pubic ramus fractures are also noted. Electronically Signed   By: Drusilla Kanner M.D.   On: 04/25/2015 16:03   Ct Maxillofacial Wo Cm  04/25/2015  CLINICAL DATA:  Fall.  Altered mental status.  Bruises. EXAM: CT HEAD WITHOUT CONTRAST CT MAXILLOFACIAL WITHOUT CONTRAST CT CERVICAL SPINE WITHOUT CONTRAST TECHNIQUE: Multidetector CT imaging of the head, cervical spine, and maxillofacial structures were performed using the standard protocol without intravenous contrast. Multiplanar CT image reconstructions of the cervical spine and maxillofacial structures were also generated. COMPARISON:  04/17/2015 head, cervical spine and maxillofacial CT. FINDINGS: CT HEAD FINDINGS Left frontal  scalp contusion is decreased since 04/17/2015. No evidence of parenchymal hemorrhage or extra-axial fluid collection. No mass lesion, mass effect, or midline shift. No CT evidence of acute infarction. Intracranial atherosclerosis. Diffuse cerebral volume loss. Nonspecific prominent stable subcortical and periventricular white matter hypodensity, most in keeping with chronic small vessel ischemic change. Stable small right lentiform nucleus lacunar infarct. No ventriculomegaly. There are new small air-fluid levels in the bilateral maxillary sinuses, with associated mucosal thickening in the bilateral maxillary sinuses. Partial opacification of the bilateral ethmoidal air cells. Stable tiny mucous retention cyst versus polyp in the posterior left frontal sinus. Mild mucosal thickening and inspissated secretions in the sphenoid sinus. New partial opacification of the right mastoid air cells. Left mastoid air cells are unopacified. No evidence of calvarial fracture. CT MAXILLOFACIAL FINDINGS Re- demonstration of small left frontal scalp contusion. No maxillofacial fracture. The maxilla and mandible appear intact. The nasal bones are unremarkable in appearance. There is stable levocurvature of the intact appearing nasal septum. No dislocation at the temporomandibular joints. 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 The visualized dentition demonstrates no acute abnormality. The orbits are intact bilaterally. Re- demonstration of a new small fluid levels in the bilateral maxillary sinuses with associated mild mucosal thickening in the bilateral maxillary sinuses. Partial opacification of the bilateral ethmoidal air cells. Stable tiny mucous retention cyst versus polyp in the posterior left frontal sinus. Mild mucosal thickening with mild inspissated secretions in the sphenoid sinus. No partial opacification of right mastoid air cells. Unopacified left mastoid air cells. No aggressive appearing focal  osseous lesions. The parapharyngeal fat planes are preserved. The nasopharynx, oropharynx and hypopharynx are unremarkable in appearance. The parotid and submandibular glands are within normal limits. No cervical lymphadenopathy is seen. CT CERVICAL SPINE FINDINGS No fracture is detected in the cervical spine. Stable subacute to chronic moderate T3 vertebral compression fracture with approximately 40% loss of vertebral body height, unchanged since 04/17/2015. No prevertebral soft tissue swelling. Normal cervical lordosis. Dens  is well positioned between the lateral masses of C1. The lateral masses appear well-aligned. Moderate degenerative disc disease throughout the cervical spine, most prominent at C6-7. Moderate facet arthropathy in the cervical spine, asymmetric to the left. No significant cervical foraminal stenosis. No cervical spine subluxation. New partial opacification of the right mastoid air cells. Visualized left mastoid air cells appear unopacified. No evidence of intra-axial hemorrhage in the visualized brain. No gross cervical canal hematoma. Apical right upper lobe 4 mm pulmonary nodule (series 12/ image 73), stable since 04/17/2015. No cervical adenopathy or other significant neck soft tissue abnormality. IMPRESSION: 1. Small left frontal scalp contusion, decreased since 04/17/2015. No evidence of acute intracranial abnormality. No calvarial fracture. 2. No maxillofacial fracture. 3. No fracture or subluxation in the cervical spine. 4. Subacute to chronic moderate T3 vertebral compression fracture, stable. 5. Acute paranasal sinusitis, worsened. New partial opacification of the right mastoid air cells, nonspecific, favor a mild infectious or inflammatory right mastoiditis. 6. Diffuse cerebral atrophy, prominent chronic small vessel ischemic white matter change and chronic right basal ganglia lacunar infarct. Electronically Signed   By: Delbert Phenix M.D.   On: 04/25/2015 16:14    Scheduled  Meds: . amiodarone  200 mg Oral Daily  . amLODipine  5 mg Oral Daily  . aspirin EC  325 mg Oral QPM  . atorvastatin  40 mg Oral q1800  . azithromycin  500 mg Oral Q24H  . cefTRIAXone (ROCEPHIN)  IV  1 g Intravenous Q24H  . enoxaparin (LOVENOX) injection  40 mg Subcutaneous Q24H  . feeding supplement (ENSURE ENLIVE)  237 mL Oral TID BM  . fluticasone  2 spray Each Nare Daily  . folic acid  1 mg Oral Daily  . guaiFENesin  600 mg Oral BID  . lisinopril  40 mg Oral Daily  . memantine  10 mg Oral BID  . pantoprazole  40 mg Oral Daily   Continuous Infusions: . sodium chloride 75 mL/hr at 04/25/15 2204   Antibiotics Given (last 72 hours)    Date/Time Action Medication Dose Rate   04/25/15 2347 Given  [meds not available at scheduled time]   cefTRIAXone (ROCEPHIN) 1 g in dextrose 5 % 50 mL IVPB 1 g 100 mL/hr      Principal Problem:   Right upper lobe pneumonia Active Problems:   HTN (hypertension)   Atrial fibrillation (HCC)   Fall    Time spent:    [facility]  [facility] Pager [phone]. If 7PM-7AM, please contact night-coverage at [email or web address], password [facility] 04/26/2015, 11:55 AM

## 2015-04-26 NOTE — Evaluation (Signed)
Clinical/Bedside Swallow Evaluation Patient Details  Name: Jacqueline [name] Gallier MRN: [id number] Date of Birth: [birth year]  Today's Date: 04/26/2015 Time: SLP Start Time (ACUTE ONLY): 1335 SLP Stop Time (ACUTE ONLY): 1406 SLP Time Calculation (min) (ACUTE ONLY): 31 min  Past Medical History:  Past Medical History  Diagnosis Date  . Diverticulitis   . Hypertension   . Osteoporosis   . CAD (coronary artery disease)   . Dementia   . PAF (paroxysmal atrial fibrillation) (HCC)   . HLD (hyperlipidemia)   . GERD (gastroesophageal reflux disease)    Past Surgical History:  Past Surgical History  Procedure Laterality Date  . Fracture surgery    . Partial colectomy    . Coronary artery bypass graft N/[name] 08/25/2012    Procedure: CORONARY ARTERY BYPASS GRAFTING ;  Surgeon: Alleen BorneBryan K Bartle, MD;  Location: [facility] OR;  Service: Open Heart Surgery;  Laterality: N/[name];  four bypasses total   . Left heart catheterization with coronary angiogram N/[name] 08/22/2012    Procedure: LEFT HEART CATHETERIZATION WITH CORONARY ANGIOGRAM;  Surgeon: Peter M SwazilandJordan, MD;  [facility];  Service: Cardiovascular;  Laterality: N/[name];   HPI:  Jacqueline [name] Jacqueline [name] is [name] 82 y.o. female with PMH of dementia, coronary artery disease status post CABG 5, remote paroxysmal [name]. fib followed by cardiology on no anticoagulation, hypertension, hyperlipidemia, GERD, osteoporosis, diverticulitis, compression fracture T12, who presents with [name] generalized weakness and concern for PNA.   Assessment / Plan / Recommendation Clinical Impression  Pt has immediate coughing following most cup sips of thin liquids, concerning for airway compromise. SLP provided Min cues for small sips of thin liquids, which alleviated coughing. She has prolonged mastication of soft solids, requiring multiple puree and liquid boluses to clear. Pt also has frequent eructation and subjective c/o difficulty getting solid foods to "go down", suspicious for esophageal  involvement. Recommend Dys 2 diet and nectar thick liquids to maximize safety.    Aspiration Risk  Mild aspiration risk;Risk for inadequate nutrition/hydration    Diet Recommendation Dysphagia 2 (Fine chop);Nectar-thick liquid   Liquid Administration via: Cup;Straw Medication Administration: Whole meds with puree Supervision: Patient able to self feed;Staff to assist with self feeding;Full supervision/cueing for compensatory strategies Compensations: Minimize environmental distractions;Slow rate;Small sips/bites;Follow solids with liquid Postural Changes: Seated upright at 90 degrees;Remain upright for at least 30 minutes after po intake    Other  Recommendations Oral Care Recommendations: Oral care BID Other Recommendations: Order thickener from pharmacy;Prohibited food (jello, ice cream, thin soups);Remove water pitcher   Follow up Recommendations  Skilled Nursing facility;24 hour supervision/assistance    Frequency and Duration min 2x/week  2 weeks       Prognosis Prognosis for Safe Diet Advancement: Fair      Swallow Study   General HPI: Jacqueline [name] Jacqueline [name] is [name] 82 y.o. female with PMH of dementia, coronary artery disease status post CABG 5, remote paroxysmal [name]. fib followed by cardiology on no anticoagulation, hypertension, hyperlipidemia, GERD, osteoporosis, diverticulitis, compression fracture T12, who presents with [name] generalized weakness and concern for PNA. Type of Study: Bedside Swallow Evaluation Previous Swallow Assessment: none in chart Diet Prior to this Study: Regular;Thin liquids Temperature Spikes Noted: No Respiratory Status: Room air History of Recent Intubation: No Behavior/Cognition: Alert;Cooperative;Requires cueing Oral Cavity Assessment: Within Functional Limits Oral Care Completed by SLP: No Oral Cavity - Dentition: Adequate natural dentition Vision: Functional for self-feeding Self-Feeding Abilities: Able to feed self;Needs assist Patient  Positioning: Upright in bed Baseline Vocal Quality: Normal  Oral/Motor/Sensory Function Overall Oral Motor/Sensory Function: Generalized oral weakness   Ice Chips Ice chips: Not tested   Thin Liquid Thin Liquid: Impaired Presentation: Cup;Self Fed Pharyngeal  Phase Impairments: Suspected delayed Swallow;Cough - Immediate    Nectar Thick Nectar Thick Liquid: Impaired Presentation: Cup;Self Fed Pharyngeal Phase Impairments: Suspected delayed Swallow   Honey Thick Honey Thick Liquid: Not tested   Puree Puree: Impaired Presentation: Spoon Oral Phase Functional Implications: Prolonged oral transit   Solid   GO   Solid: Impaired Presentation: Spoon Oral Phase Functional Implications: Impaired mastication;Prolonged oral transit       Maxcine Ham, M.[name]. CCC-SLP [phone]  Maxcine Ham 04/26/2015,2:18 PM

## 2015-04-26 NOTE — Care Management Note (Addendum)
Case Management Note  Patient Details  Name: Jacqueline [name] MRN: [id number] Date of Birth: 1928/12/20  Subjective/Objective:                    Action/Plan:  Patient is from  [place] currently while her son is out of town (usually lives with family and is cared for at home). Multiple falls , confused pneumonia. PT and ST evals ordered  . SW consult entered   Lovenia KimSteven Cancro (Son) [phone] Edwyna Perfectngela Fitchett (Daughter)  [phone]   Expected Discharge Date:                  Expected Discharge Plan:  Assisted Living / Rest Home  In-House Referral:     Discharge planning Services     Post Acute Care Choice:    Choice offered to:     DME Arranged:    DME Agency:     HH Arranged:    HH Agency:     Status of Service:  In process, will continue to follow  Medicare Important Message Given:    Date Medicare IM Given:    Medicare IM give by:    Date Additional Medicare IM Given:    Additional Medicare Important Message give by:     If discussed at Long Length of Stay Meetings, dates discussed:    Additional Comments:  Kingsley PlanWile, Tyjay Galindo Marie, RN 04/26/2015, 8:23 AM

## 2015-04-27 LAB — CBC
HCT: 33.6 % — ABNORMAL LOW (ref 36.0–46.0)
Hemoglobin: 11 g/dL — ABNORMAL LOW (ref 12.0–15.0)
MCH: 33.4 pg (ref 26.0–34.0)
MCHC: 32.7 g/dL (ref 30.0–36.0)
MCV: 102.1 fL — ABNORMAL HIGH (ref 78.0–100.0)
PLATELETS: 241 10*3/uL (ref 150–400)
RBC: 3.29 MIL/uL — AB (ref 3.87–5.11)
RDW: 15 % (ref 11.5–15.5)
WBC: 6.6 10*3/uL (ref 4.0–10.5)

## 2015-04-27 LAB — EXPECTORATED SPUTUM ASSESSMENT W GRAM STAIN, RFLX TO RESP C

## 2015-04-27 LAB — EXPECTORATED SPUTUM ASSESSMENT W REFEX TO RESP CULTURE

## 2015-04-27 LAB — BASIC METABOLIC PANEL
ANION GAP: 7 (ref 5–15)
BUN: 13 mg/dL (ref 6–20)
CO2: 27 mmol/L (ref 22–32)
Calcium: 8.5 mg/dL — ABNORMAL LOW (ref 8.9–10.3)
Chloride: 108 mmol/L (ref 101–111)
Creatinine, Ser: 0.49 mg/dL (ref 0.44–1.00)
GFR calc Af Amer: >60 mL/min (ref >60)
GFR calc non Af Amer: >60 mL/min (ref >60)
Glucose, Bld: 94 mg/dL (ref 65–99)
POTASSIUM: 3.5 mmol/L (ref 3.5–5.1)
SODIUM: 142 mmol/L (ref 135–145)

## 2015-04-27 MED ORDER — WHITE PETROLATUM GEL
Status: AC
  Administered 2015-04-27: 0.2
  Filled 2015-04-27: qty 1

## 2015-04-27 NOTE — Progress Notes (Signed)
[facility] PROGRESS NOTE  Jacqueline [name] ZOX:[id number] DOB: May 20, 1928 DOA: 04/25/2015 PCP: [name], MD   HPI: Jacqueline [name] is a 82 y.o. female with h/o mild dementia, CABG, HTN, A.Fib on amiodarone but not on anticoagulants due to fall risk , resident of ALF. Patient is sent in to the ED today with new onset of confusion per her PCP, also has cough for week  Assessment/Plan: 1. Community acquired pneumonia      -improving,  small area of ground glass attenuation in RUL, wet sounding cough/congestion      -continue rocephin/zithromax,  mucinex      -SLP evaluation completed D2 diet and thickened liquids ordered      -FU sputum, blood Cx, FU Flu PCR  2. PAF      - rate controlled, continue amiodarone, no anticoagulants due to fall risk      -continue ASA  3. HTN       - BP on higher side, continue amlodipine, lisinopril  4. Dementia      -mild, stable  5. Falls        -ambulate,  Orthostatics positive yesterday, s/p IVF for 2days,, Pt/OT eval       -repeat orthostatics tomorrrow  6. HLD - continue lipitor  DVT proph: lovenox  Code Status: DNR Family Communication: none at bedside Disposition Plan: to be determined, may need SNF   HPI/Subjective: Feels weak, productive cough  Objective: Filed Vitals:   04/26/15 2206 04/27/15 0651  BP: 169/80 188/84  Pulse: 71 73  Temp: 98.1 F (36.7 C) 97.9 F (36.6 C)  Resp: 18 18    Intake/Output Summary (Last 24 hours) at 04/27/15 1349 Last data filed at 04/27/15 0615  Gross per 24 hour  Intake   2549 ml  Output   1550 ml  Net    999 ml   Filed Weights   04/25/15 1412 04/25/15 2057  Weight: 48.081 kg (106 lb) 51.574 kg (113 lb 11.2 oz)    Exam:   General:  Alert, awake, oriented to self and place, bruises all over, arms, face, under eyes, neck  Cardiovascular: S1S2/RRR  Respiratory: ronchi on R  Abdomen: soft, Nt, BS present  Musculoskeletal: no edema    Data  Reviewed: Basic Metabolic Panel:  Recent Labs Lab 04/25/15 1430 04/27/15 0514  NA 142 142  K 3.2* 3.5  CL 108 108  CO2 28 27  GLUCOSE 149* 94  BUN 28* 13  CREATININE 0.64 0.49  CALCIUM 8.8* 8.5*   Liver Function Tests:  Recent Labs Lab 04/25/15 1430  AST 112*  ALT 143*  ALKPHOS 73  BILITOT 0.6  PROT 6.3*  ALBUMIN 2.7*    Recent Labs Lab 04/25/15 1430  LIPASE 53*   No results for input(s): AMMONIA in the last 168 hours. CBC:  Recent Labs Lab 04/25/15 1430 04/27/15 0514  WBC 9.5 6.6  NEUTROABS 8.3*  --   HGB 11.8* 11.0*  HCT 37.0 33.6*  MCV 103.4* 102.1*  PLT 230 241   Cardiac Enzymes:  Recent Labs Lab 04/25/15 1430  TROPONINI <0.03   BNP (last 3 results)  Recent Labs  07/19/14 1818  BNP 191.0*    ProBNP (last 3 results) No results for input(s): PROBNP in the last 8760 hours.  CBG:  Recent Labs Lab 04/25/15 1451  GLUCAP 140*    Recent Results (from the past 240 hour(s))  MRSA PCR Screening     Status: None   Collection Time: 04/26/15  5:17 AM  Result Value Ref Range Status   MRSA by PCR NEGATIVE NEGATIVE Final    Comment:        The GeneXpert MRSA Assay (FDA approved for NASAL specimens only), is one component of a comprehensive MRSA colonization surveillance program. It is not intended to diagnose MRSA infection nor to guide or monitor treatment for MRSA infections.   Culture, sputum-assessment     Status: None   Collection Time: 04/27/15  7:05 AM  Result Value Ref Range Status   Specimen Description SPUTUM  Final   Special Requests NONE  Final   Sputum evaluation   Final    MICROSCOPIC FINDINGS SUGGEST THAT THIS SPECIMEN IS NOT REPRESENTATIVE OF LOWER RESPIRATORY SECRETIONS. PLEASE RECOLLECT. Gram Stain Report Called to,Read Back By and Verified With: Marylee FlorasV. CALWITAN,RN AT 0831 ON [id number] BY Lucienne CapersS. YARBROUGH    Report Status 04/27/2015 FINAL  Final     Studies: Ct Head Wo Contrast  04/25/2015  CLINICAL DATA:  Fall.   Altered mental status.  Bruises. EXAM: CT HEAD WITHOUT CONTRAST CT MAXILLOFACIAL WITHOUT CONTRAST CT CERVICAL SPINE WITHOUT CONTRAST TECHNIQUE: Multidetector CT imaging of the head, cervical spine, and maxillofacial structures were performed using the standard protocol without intravenous contrast. Multiplanar CT image reconstructions of the cervical spine and maxillofacial structures were also generated. COMPARISON:  04/17/2015 head, cervical spine and maxillofacial CT. FINDINGS: CT HEAD FINDINGS Left frontal scalp contusion is decreased since 04/17/2015. No evidence of parenchymal hemorrhage or extra-axial fluid collection. No mass lesion, mass effect, or midline shift. No CT evidence of acute infarction. Intracranial atherosclerosis. Diffuse cerebral volume loss. Nonspecific prominent stable subcortical and periventricular white matter hypodensity, most in keeping with chronic small vessel ischemic change. Stable small right lentiform nucleus lacunar infarct. No ventriculomegaly. There are new small air-fluid levels in the bilateral maxillary sinuses, with associated mucosal thickening in the bilateral maxillary sinuses. Partial opacification of the bilateral ethmoidal air cells. Stable tiny mucous retention cyst versus polyp in the posterior left frontal sinus. Mild mucosal thickening and inspissated secretions in the sphenoid sinus. New partial opacification of the right mastoid air cells. Left mastoid air cells are unopacified. No evidence of calvarial fracture. CT MAXILLOFACIAL FINDINGS Re- demonstration of small left frontal scalp contusion. No maxillofacial fracture. The maxilla and mandible appear intact. The nasal bones are unremarkable in appearance. There is stable levocurvature of the intact appearing nasal septum. No dislocation at the temporomandibular joints. 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 The visualized dentition demonstrates no acute abnormality. The orbits are intact  bilaterally. Re- demonstration of a new small fluid levels in the bilateral maxillary sinuses with associated mild mucosal thickening in the bilateral maxillary sinuses. Partial opacification of the bilateral ethmoidal air cells. Stable tiny mucous retention cyst versus polyp in the posterior left frontal sinus. Mild mucosal thickening with mild inspissated secretions in the sphenoid sinus. No partial opacification of right mastoid air cells. Unopacified left mastoid air cells. No aggressive appearing focal osseous lesions. The parapharyngeal fat planes are preserved. The nasopharynx, oropharynx and hypopharynx are unremarkable in appearance. The parotid and submandibular glands are within normal limits. No cervical lymphadenopathy is seen. CT CERVICAL SPINE FINDINGS No fracture is detected in the cervical spine. Stable subacute to chronic moderate T3 vertebral compression fracture with approximately 40% loss of vertebral body height, unchanged since 04/17/2015. No prevertebral soft tissue swelling. Normal cervical lordosis. Dens is well positioned between the lateral masses of C1. The lateral masses appear well-aligned. Moderate degenerative disc disease throughout  the cervical spine, most prominent at C6-7. Moderate facet arthropathy in the cervical spine, asymmetric to the left. No significant cervical foraminal stenosis. No cervical spine subluxation. New partial opacification of the right mastoid air cells. Visualized left mastoid air cells appear unopacified. No evidence of intra-axial hemorrhage in the visualized brain. No gross cervical canal hematoma. Apical right upper lobe 4 mm pulmonary nodule (series 12/ image 73), stable since 04/17/2015. No cervical adenopathy or other significant neck soft tissue abnormality. IMPRESSION: 1. Small left frontal scalp contusion, decreased since 04/17/2015. No evidence of acute intracranial abnormality. No calvarial fracture. 2. No maxillofacial fracture. 3. No fracture  or subluxation in the cervical spine. 4. Subacute to chronic moderate T3 vertebral compression fracture, stable. 5. Acute paranasal sinusitis, worsened. New partial opacification of the right mastoid air cells, nonspecific, favor a mild infectious or inflammatory right mastoiditis. 6. Diffuse cerebral atrophy, prominent chronic small vessel ischemic white matter change and chronic right basal ganglia lacunar infarct. Electronically Signed   By: Delbert Phenix M.D.   On: 04/25/2015 16:14   Ct Chest W Contrast  04/25/2015  CLINICAL DATA:  Status post fall last night. Pain and bruising. Initial encounter. EXAM: CT CHEST, ABDOMEN, AND PELVIS WITH CONTRAST TECHNIQUE: Multidetector CT imaging of the chest, abdomen and pelvis was performed following the standard protocol during bolus administration of intravenous contrast. CONTRAST:  100 ML OMNIPAQUE IOHEXOL 300 MG/ML  SOLN COMPARISON:  CT abdomen and pelvis 01/12/2015. PA and lateral chest 01/12/2015. MRI abdomen 12/21/2014. FINDINGS: CT CHEST FINDINGS Mediastinum/Lymph Nodes: No evidence of posttraumatic change is identified. There is cardiomegaly. Calcific aortic and coronary atherosclerosis is noted. The patient is status post CABG. Lungs/Pleura: Trace bilateral pleural effusions are seen. There is an area of ground-glass attenuation in the anterior right upper lobe. Dependent atelectasis is noted. Musculoskeletal: Remote T3 and T12 compression fractures are identified. Vertebral body height loss of T6 has progressed since the prior PA and lateral chest films. No acute fracture is identified. CT ABDOMEN PELVIS FINDINGS Hepatobiliary: A single small gallstone is identified. No evidence of cholecystitis is seen. No biliary ductal dilatation. Pancreas: Multilocular cystic lesion in the tail the pancreas is unchanged. Spleen: Unremarkable. Adrenals/Urinary Tract: The adrenal glands appear normal. Renal cysts are unchanged. Stomach/Bowel: Surgical anastomosis in the  sigmoid colon is noted. The colon is otherwise unremarkable. Stomach and small bowel appear normal. Vascular/Lymphatic: IVC filter is noted. The patient has extensive aortoiliac atherosclerosis without aneurysm. Reproductive: Status post hysterectomy. Other: None. Musculoskeletal: Right hip replacement is seen. Remote pubic ramus fractures are present bilaterally. Lower lumbar spondylosis is noted. IMPRESSION: Negative for trauma to the chest, abdomen or pelvis. Small area of ground-glass attenuation anterior right upper lobe may be due to infectious or inflammatory process. Cardiomegaly. Extensive aortic and coronary atherosclerosis. Single small gallstone without evidence of cholecystitis. No change in a small multilocular cystic lesion in the tail of the pancreas. Remote T3, T6 and T12 compression fractures. Vertebral body height loss of T6 has progressed since the prior examinations. Remote pubic ramus fractures are also noted. Electronically Signed   By: Drusilla Kanner M.D.   On: 04/25/2015 16:03   Ct Cervical Spine Wo Contrast  04/25/2015  CLINICAL DATA:  Fall.  Altered mental status.  Bruises. EXAM: CT HEAD WITHOUT CONTRAST CT MAXILLOFACIAL WITHOUT CONTRAST CT CERVICAL SPINE WITHOUT CONTRAST TECHNIQUE: Multidetector CT imaging of the head, cervical spine, and maxillofacial structures were performed using the standard protocol without intravenous contrast. Multiplanar CT image reconstructions of the cervical  spine and maxillofacial structures were also generated. COMPARISON:  04/17/2015 head, cervical spine and maxillofacial CT. FINDINGS: CT HEAD FINDINGS Left frontal scalp contusion is decreased since 04/17/2015. No evidence of parenchymal hemorrhage or extra-axial fluid collection. No mass lesion, mass effect, or midline shift. No CT evidence of acute infarction. Intracranial atherosclerosis. Diffuse cerebral volume loss. Nonspecific prominent stable subcortical and periventricular white matter  hypodensity, most in keeping with chronic small vessel ischemic change. Stable small right lentiform nucleus lacunar infarct. No ventriculomegaly. There are new small air-fluid levels in the bilateral maxillary sinuses, with associated mucosal thickening in the bilateral maxillary sinuses. Partial opacification of the bilateral ethmoidal air cells. Stable tiny mucous retention cyst versus polyp in the posterior left frontal sinus. Mild mucosal thickening and inspissated secretions in the sphenoid sinus. New partial opacification of the right mastoid air cells. Left mastoid air cells are unopacified. No evidence of calvarial fracture. CT MAXILLOFACIAL FINDINGS Re- demonstration of small left frontal scalp contusion. No maxillofacial fracture. The maxilla and mandible appear intact. The nasal bones are unremarkable in appearance. There is stable levocurvature of the intact appearing nasal septum. No dislocation at the temporomandibular joints. 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 The visualized dentition demonstrates no acute abnormality. The orbits are intact bilaterally. Re- demonstration of a new small fluid levels in the bilateral maxillary sinuses with associated mild mucosal thickening in the bilateral maxillary sinuses. Partial opacification of the bilateral ethmoidal air cells. Stable tiny mucous retention cyst versus polyp in the posterior left frontal sinus. Mild mucosal thickening with mild inspissated secretions in the sphenoid sinus. No partial opacification of right mastoid air cells. Unopacified left mastoid air cells. No aggressive appearing focal osseous lesions. The parapharyngeal fat planes are preserved. The nasopharynx, oropharynx and hypopharynx are unremarkable in appearance. The parotid and submandibular glands are within normal limits. No cervical lymphadenopathy is seen. CT CERVICAL SPINE FINDINGS No fracture is detected in the cervical spine. Stable subacute to chronic  moderate T3 vertebral compression fracture with approximately 40% loss of vertebral body height, unchanged since 04/17/2015. No prevertebral soft tissue swelling. Normal cervical lordosis. Dens is well positioned between the lateral masses of C1. The lateral masses appear well-aligned. Moderate degenerative disc disease throughout the cervical spine, most prominent at C6-7. Moderate facet arthropathy in the cervical spine, asymmetric to the left. No significant cervical foraminal stenosis. No cervical spine subluxation. New partial opacification of the right mastoid air cells. Visualized left mastoid air cells appear unopacified. No evidence of intra-axial hemorrhage in the visualized brain. No gross cervical canal hematoma. Apical right upper lobe 4 mm pulmonary nodule (series 12/ image 73), stable since 04/17/2015. No cervical adenopathy or other significant neck soft tissue abnormality. IMPRESSION: 1. Small left frontal scalp contusion, decreased since 04/17/2015. No evidence of acute intracranial abnormality. No calvarial fracture. 2. No maxillofacial fracture. 3. No fracture or subluxation in the cervical spine. 4. Subacute to chronic moderate T3 vertebral compression fracture, stable. 5. Acute paranasal sinusitis, worsened. New partial opacification of the right mastoid air cells, nonspecific, favor a mild infectious or inflammatory right mastoiditis. 6. Diffuse cerebral atrophy, prominent chronic small vessel ischemic white matter change and chronic right basal ganglia lacunar infarct. Electronically Signed   By: Delbert Phenix M.D.   On: 04/25/2015 16:14   Ct Abdomen Pelvis W Contrast  04/25/2015  CLINICAL DATA:  Status post fall last night. Pain and bruising. Initial encounter. EXAM: CT CHEST, ABDOMEN, AND PELVIS WITH CONTRAST TECHNIQUE: Multidetector CT imaging of the chest, abdomen  and pelvis was performed following the standard protocol during bolus administration of intravenous contrast. CONTRAST:  100  ML OMNIPAQUE IOHEXOL 300 MG/ML  SOLN COMPARISON:  CT abdomen and pelvis 01/12/2015. PA and lateral chest 01/12/2015. MRI abdomen 12/21/2014. FINDINGS: CT CHEST FINDINGS Mediastinum/Lymph Nodes: No evidence of posttraumatic change is identified. There is cardiomegaly. Calcific aortic and coronary atherosclerosis is noted. The patient is status post CABG. Lungs/Pleura: Trace bilateral pleural effusions are seen. There is an area of ground-glass attenuation in the anterior right upper lobe. Dependent atelectasis is noted. Musculoskeletal: Remote T3 and T12 compression fractures are identified. Vertebral body height loss of T6 has progressed since the prior PA and lateral chest films. No acute fracture is identified. CT ABDOMEN PELVIS FINDINGS Hepatobiliary: A single small gallstone is identified. No evidence of cholecystitis is seen. No biliary ductal dilatation. Pancreas: Multilocular cystic lesion in the tail the pancreas is unchanged. Spleen: Unremarkable. Adrenals/Urinary Tract: The adrenal glands appear normal. Renal cysts are unchanged. Stomach/Bowel: Surgical anastomosis in the sigmoid colon is noted. The colon is otherwise unremarkable. Stomach and small bowel appear normal. Vascular/Lymphatic: IVC filter is noted. The patient has extensive aortoiliac atherosclerosis without aneurysm. Reproductive: Status post hysterectomy. Other: None. Musculoskeletal: Right hip replacement is seen. Remote pubic ramus fractures are present bilaterally. Lower lumbar spondylosis is noted. IMPRESSION: Negative for trauma to the chest, abdomen or pelvis. Small area of ground-glass attenuation anterior right upper lobe may be due to infectious or inflammatory process. Cardiomegaly. Extensive aortic and coronary atherosclerosis. Single small gallstone without evidence of cholecystitis. No change in a small multilocular cystic lesion in the tail of the pancreas. Remote T3, T6 and T12 compression fractures. Vertebral body height loss  of T6 has progressed since the prior examinations. Remote pubic ramus fractures are also noted. Electronically Signed   By: Drusilla Kanner M.D.   On: 04/25/2015 16:03   Ct Maxillofacial Wo Cm  04/25/2015  CLINICAL DATA:  Fall.  Altered mental status.  Bruises. EXAM: CT HEAD WITHOUT CONTRAST CT MAXILLOFACIAL WITHOUT CONTRAST CT CERVICAL SPINE WITHOUT CONTRAST TECHNIQUE: Multidetector CT imaging of the head, cervical spine, and maxillofacial structures were performed using the standard protocol without intravenous contrast. Multiplanar CT image reconstructions of the cervical spine and maxillofacial structures were also generated. COMPARISON:  04/17/2015 head, cervical spine and maxillofacial CT. FINDINGS: CT HEAD FINDINGS Left frontal scalp contusion is decreased since 04/17/2015. No evidence of parenchymal hemorrhage or extra-axial fluid collection. No mass lesion, mass effect, or midline shift. No CT evidence of acute infarction. Intracranial atherosclerosis. Diffuse cerebral volume loss. Nonspecific prominent stable subcortical and periventricular white matter hypodensity, most in keeping with chronic small vessel ischemic change. Stable small right lentiform nucleus lacunar infarct. No ventriculomegaly. There are new small air-fluid levels in the bilateral maxillary sinuses, with associated mucosal thickening in the bilateral maxillary sinuses. Partial opacification of the bilateral ethmoidal air cells. Stable tiny mucous retention cyst versus polyp in the posterior left frontal sinus. Mild mucosal thickening and inspissated secretions in the sphenoid sinus. New partial opacification of the right mastoid air cells. Left mastoid air cells are unopacified. No evidence of calvarial fracture. CT MAXILLOFACIAL FINDINGS Re- demonstration of small left frontal scalp contusion. No maxillofacial fracture. The maxilla and mandible appear intact. The nasal bones are unremarkable in appearance. There is stable  levocurvature of the intact appearing nasal septum. No dislocation at the temporomandibular joints. 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 The visualized dentition demonstrates no acute abnormality. The orbits are intact bilaterally. Re- demonstration  of a new small fluid levels in the bilateral maxillary sinuses with associated mild mucosal thickening in the bilateral maxillary sinuses. Partial opacification of the bilateral ethmoidal air cells. Stable tiny mucous retention cyst versus polyp in the posterior left frontal sinus. Mild mucosal thickening with mild inspissated secretions in the sphenoid sinus. No partial opacification of right mastoid air cells. Unopacified left mastoid air cells. No aggressive appearing focal osseous lesions. The parapharyngeal fat planes are preserved. The nasopharynx, oropharynx and hypopharynx are unremarkable in appearance. The parotid and submandibular glands are within normal limits. No cervical lymphadenopathy is seen. CT CERVICAL SPINE FINDINGS No fracture is detected in the cervical spine. Stable subacute to chronic moderate T3 vertebral compression fracture with approximately 40% loss of vertebral body height, unchanged since 04/17/2015. No prevertebral soft tissue swelling. Normal cervical lordosis. Dens is well positioned between the lateral masses of C1. The lateral masses appear well-aligned. Moderate degenerative disc disease throughout the cervical spine, most prominent at C6-7. Moderate facet arthropathy in the cervical spine, asymmetric to the left. No significant cervical foraminal stenosis. No cervical spine subluxation. New partial opacification of the right mastoid air cells. Visualized left mastoid air cells appear unopacified. No evidence of intra-axial hemorrhage in the visualized brain. No gross cervical canal hematoma. Apical right upper lobe 4 mm pulmonary nodule (series 12/ image 73), stable since 04/17/2015. No cervical adenopathy or  other significant neck soft tissue abnormality. IMPRESSION: 1. Small left frontal scalp contusion, decreased since 04/17/2015. No evidence of acute intracranial abnormality. No calvarial fracture. 2. No maxillofacial fracture. 3. No fracture or subluxation in the cervical spine. 4. Subacute to chronic moderate T3 vertebral compression fracture, stable. 5. Acute paranasal sinusitis, worsened. New partial opacification of the right mastoid air cells, nonspecific, favor a mild infectious or inflammatory right mastoiditis. 6. Diffuse cerebral atrophy, prominent chronic small vessel ischemic white matter change and chronic right basal ganglia lacunar infarct. Electronically Signed   By: Delbert Phenix M.D.   On: 04/25/2015 16:14    Scheduled Meds: . amiodarone  200 mg Oral Daily  . amLODipine  5 mg Oral Daily  . aspirin EC  325 mg Oral QPM  . atorvastatin  40 mg Oral q1800  . azithromycin  500 mg Oral Q24H  . cefTRIAXone (ROCEPHIN)  IV  1 g Intravenous Q24H  . enoxaparin (LOVENOX) injection  40 mg Subcutaneous Q24H  . feeding supplement (ENSURE ENLIVE)  237 mL Oral TID BM  . fluticasone  2 spray Each Nare Daily  . folic acid  1 mg Oral Daily  . guaiFENesin  600 mg Oral BID  . latanoprost  1 drop Both Eyes QHS  . lisinopril  40 mg Oral Daily  . memantine  10 mg Oral BID  . pantoprazole  40 mg Oral Daily   Continuous Infusions:   Antibiotics Given (last 72 hours)    Date/Time Action Medication Dose Rate   04/25/15 2347 Given  [meds not available at scheduled time]   cefTRIAXone (ROCEPHIN) 1 g in dextrose 5 % 50 mL IVPB 1 g 100 mL/hr   04/26/15 1724 Given   azithromycin (ZITHROMAX) tablet 500 mg 500 mg    04/26/15 2217 Given   cefTRIAXone (ROCEPHIN) 1 g in dextrose 5 % 50 mL IVPB 1 g 100 mL/hr      Principal Problem:   Right upper lobe pneumonia Active Problems:   HTN (hypertension)   Atrial fibrillation (HCC)   Fall   Community acquired pneumonia    Time  spent:     [facility]  [facility] Pager [phone]. If 7PM-7AM, please contact night-coverage at [email or web address], password [place] 04/27/2015, 1:49 PM  LOS: 1 day

## 2015-04-28 DIAGNOSIS — I10 Essential (primary) hypertension: Secondary | ICD-10-CM

## 2015-04-28 LAB — BASIC METABOLIC PANEL
ANION GAP: 7 (ref 5–15)
BUN: 14 mg/dL (ref 6–20)
CHLORIDE: 103 mmol/L (ref 101–111)
CO2: 30 mmol/L (ref 22–32)
Calcium: 8.9 mg/dL (ref 8.9–10.3)
Creatinine, Ser: 0.66 mg/dL (ref 0.44–1.00)
GFR calc Af Amer: >60 mL/min (ref >60)
Glucose, Bld: 125 mg/dL — ABNORMAL HIGH (ref 65–99)
POTASSIUM: 3.9 mmol/L (ref 3.5–5.1)
SODIUM: 140 mmol/L (ref 135–145)

## 2015-04-28 LAB — CBC
HCT: 37.2 % (ref 36.0–46.0)
HEMOGLOBIN: 12.3 g/dL (ref 12.0–15.0)
MCH: 33.8 pg (ref 26.0–34.0)
MCHC: 33.1 g/dL (ref 30.0–36.0)
MCV: 102.2 fL — AB (ref 78.0–100.0)
PLATELETS: 291 10*3/uL (ref 150–400)
RBC: 3.64 MIL/uL — AB (ref 3.87–5.11)
RDW: 15.1 % (ref 11.5–15.5)
WBC: 7.7 10*3/uL (ref 4.0–10.5)

## 2015-04-28 MED ORDER — HYDROCODONE-ACETAMINOPHEN 5-325 MG PO TABS
1.0000 | ORAL_TABLET | Freq: Once | ORAL | Status: AC
  Administered 2015-04-28: 1 via ORAL
  Filled 2015-04-28: qty 1

## 2015-04-28 NOTE — Progress Notes (Signed)
[facility] PROGRESS NOTE  Jacqueline [name] UEA:[id number] DOB: 1928/06/05 DOA: 04/25/2015 PCP: [name], MD   HPI: Jacqueline [name] is a 82 y.o. female with h/o mild dementia, CABG, HTN, A.Fib on amiodarone but not on anticoagulants due to fall risk , resident of ALF. Patient is sent in to the ED today with new onset of confusion per her PCP, also has cough for week  Assessment/Plan: 1. Community acquired pneumonia      -improving,  small area of ground glass attenuation in RUL, wet sounding cough/congestion      -continue rocephin/zithromax,  mucinex      -SLP evaluation completed D2 diet and thickened liquids ordered      -Sputum cx negative, blood Cx-NGTD  2. PAF      - rate controlled, continue amiodarone, no anticoagulants due to fall risk      - continue ASA  3. HTN       - BP on higher side, continue amlodipine, lisinopril  4. Dementia      -mild, stable  5. Falls        -ambulate,  Orthostatics positive on admission, s/p IVF for 2days,, Pt/OT eval        -ambulate, OOB  6. HLD - continue lipitor  DVT proph: lovenox  Code Status: DNR Family Communication: none at bedside Disposition Plan: to be determined, may need SNF   HPI/Subjective: Feels weak, productive cough  Objective: Filed Vitals:   04/27/15 2049 04/28/15 0639  BP: 150/75 137/79  Pulse: 71 78  Temp: 98.1 F (36.7 C) 97.6 F (36.4 C)  Resp: 16 16    Intake/Output Summary (Last 24 hours) at 04/28/15 1305 Last data filed at 04/28/15 4782  Gross per 24 hour  Intake    650 ml  Output    200 ml  Net    450 ml   Filed Weights   04/25/15 1412 04/25/15 2057  Weight: 48.081 kg (106 lb) 51.574 kg (113 lb 11.2 oz)    Exam:   General:  Alert, awake, oriented to self and place, bruises all over, arms, face, under eyes, neck  Cardiovascular: S1S2/RRR  Respiratory: ronchi on R  Abdomen: soft, Nt, BS present  Musculoskeletal: no edema    Data Reviewed: Basic  Metabolic Panel:  Recent Labs Lab 04/25/15 1430 04/27/15 0514 04/28/15 0550  NA 142 142 140  K 3.2* 3.5 3.9  CL 108 108 103  CO2 28 27 30   GLUCOSE 149* 94 125*  BUN 28* 13 14  CREATININE 0.64 0.49 0.66  CALCIUM 8.8* 8.5* 8.9   Liver Function Tests:  Recent Labs Lab 04/25/15 1430  AST 112*  ALT 143*  ALKPHOS 73  BILITOT 0.6  PROT 6.3*  ALBUMIN 2.7*    Recent Labs Lab 04/25/15 1430  LIPASE 53*   No results for input(s): AMMONIA in the last 168 hours. CBC:  Recent Labs Lab 04/25/15 1430 04/27/15 0514 04/28/15 0550  WBC 9.5 6.6 7.7  NEUTROABS 8.3*  --   --   HGB 11.8* 11.0* 12.3  HCT 37.0 33.6* 37.2  MCV 103.4* 102.1* 102.2*  PLT 230 241 291   Cardiac Enzymes:  Recent Labs Lab 04/25/15 1430  TROPONINI <0.03   BNP (last 3 results)  Recent Labs  07/19/14 1818  BNP 191.0*    ProBNP (last 3 results) No results for input(s): PROBNP in the last 8760 hours.  CBG:  Recent Labs Lab 04/25/15 1451  GLUCAP 140*    Recent  Results (from the past 240 hour(s))  Culture, blood (Routine X 2) w Reflex to ID Panel     Status: None (Preliminary result)   Collection Time: 04/25/15  5:30 PM  Result Value Ref Range Status   Specimen Description BLOOD LEFT AC  Final   Special Requests BOTTLES DRAWN AEROBIC AND ANAEROBIC 5ML EACH  Final   Culture   Final    NO GROWTH 1 DAY [facility]    Report Status PENDING  Incomplete  Culture, blood (Routine X 2) w Reflex to ID Panel     Status: None (Preliminary result)   Collection Time: 04/25/15  5:40 PM  Result Value Ref Range Status   Specimen Description BLOOD LEFT HAND  Final   Special Requests BOTTLES DRAWN AEROBIC AND ANAEROBIC 5ML EACH  Final   Culture   Final    NO GROWTH 1 DAY Performed at [facility]    Report Status PENDING  Incomplete  MRSA PCR Screening     Status: None   Collection Time: 04/26/15  5:17 AM  Result Value Ref Range Status   MRSA by PCR NEGATIVE  NEGATIVE Final    Comment:        The GeneXpert MRSA Assay (FDA approved for NASAL specimens only), is one component of a comprehensive MRSA colonization surveillance program. It is not intended to diagnose MRSA infection nor to guide or monitor treatment for MRSA infections.   Culture, sputum-assessment     Status: None   Collection Time: 04/27/15  7:05 AM  Result Value Ref Range Status   Specimen Description SPUTUM  Final   Special Requests NONE  Final   Sputum evaluation   Final    MICROSCOPIC FINDINGS SUGGEST THAT THIS SPECIMEN IS NOT REPRESENTATIVE OF LOWER RESPIRATORY SECRETIONS. PLEASE RECOLLECT. Gram Stain Report Called to,Read Back By and Verified With: Marylee FlorasV. CALWITAN,RN AT 0831 ON [id number] BY Lucienne CapersS. YARBROUGH    Report Status 04/27/2015 FINAL  Final     Studies: No results found.  Scheduled Meds: . amiodarone  200 mg Oral Daily  . amLODipine  5 mg Oral Daily  . aspirin EC  325 mg Oral QPM  . atorvastatin  40 mg Oral q1800  . azithromycin  500 mg Oral Q24H  . cefTRIAXone (ROCEPHIN)  IV  1 g Intravenous Q24H  . enoxaparin (LOVENOX) injection  40 mg Subcutaneous Q24H  . feeding supplement (ENSURE ENLIVE)  237 mL Oral TID BM  . fluticasone  2 spray Each Nare Daily  . folic acid  1 mg Oral Daily  . guaiFENesin  600 mg Oral BID  . HYDROcodone-acetaminophen  1 tablet Oral Once  . latanoprost  1 drop Both Eyes QHS  . lisinopril  40 mg Oral Daily  . memantine  10 mg Oral BID  . pantoprazole  40 mg Oral Daily   Continuous Infusions:   Antibiotics Given (last 72 hours)    Date/Time Action Medication Dose Rate   04/25/15 2347 Given  [meds not available at scheduled time]   cefTRIAXone (ROCEPHIN) 1 g in dextrose 5 % 50 mL IVPB 1 g 100 mL/hr   04/26/15 1724 Given   azithromycin (ZITHROMAX) tablet 500 mg 500 mg    04/26/15 2217 Given   cefTRIAXone (ROCEPHIN) 1 g in dextrose 5 % 50 mL IVPB 1 g 100 mL/hr   04/27/15 1735 Given   azithromycin (ZITHROMAX) tablet 500 mg 500 mg     04/27/15 2100 Given   cefTRIAXone (  ROCEPHIN) 1 g in dextrose 5 % 50 mL IVPB 1 g 100 mL/hr      Principal Problem:   Right upper lobe pneumonia Active Problems:   HTN (hypertension)   Atrial fibrillation (HCC)   Fall   Community acquired pneumonia    Time spent:    [facility]  [facility] Pager [phone]. If 7PM-7AM, please contact night-coverage at [email or web address], password [facility] 04/28/2015, 1:05 PM  LOS: 2 days

## 2015-04-29 MED ORDER — POLYETHYLENE GLYCOL 3350 17 G PO PACK
17.0000 g | PACK | Freq: Two times a day (BID) | ORAL | Status: DC
  Administered 2015-04-29 – 2015-04-30 (×3): 17 g via ORAL
  Filled 2015-04-29 (×3): qty 1

## 2015-04-29 MED ORDER — LEVOFLOXACIN 500 MG PO TABS
500.0000 mg | ORAL_TABLET | Freq: Every day | ORAL | Status: DC
  Administered 2015-04-29 – 2015-04-30 (×2): 500 mg via ORAL
  Filled 2015-04-29 (×2): qty 1

## 2015-04-29 MED ORDER — SENNOSIDES-DOCUSATE SODIUM 8.6-50 MG PO TABS
1.0000 | ORAL_TABLET | Freq: Two times a day (BID) | ORAL | Status: DC
  Administered 2015-04-29 – 2015-04-30 (×3): 1 via ORAL
  Filled 2015-04-29 (×3): qty 1

## 2015-04-29 MED ORDER — HYDROCODONE-ACETAMINOPHEN 5-325 MG PO TABS
1.0000 | ORAL_TABLET | Freq: Four times a day (QID) | ORAL | Status: DC | PRN
  Administered 2015-04-29 (×2): 1 via ORAL
  Filled 2015-04-29 (×2): qty 1

## 2015-04-29 NOTE — Progress Notes (Signed)
[facility] PROGRESS NOTE  [name]:[id number] DOB: 1928-07-12 DOA: 04/25/2015 PCP: [name], MD   HPI: Jacqueline [name] is a 82 y.o. female with h/o mild dementia, CABG, HTN, A.Fib on amiodarone but not on anticoagulants due to fall risk , resident of ALF. Patient is sent in to the ED today with new onset of confusion per her PCP, also has cough for week  Assessment/Plan: 1. Community acquired pneumonia      -improving,  small area of ground glass attenuation in RUL, wet sounding cough/congestion      -change rocephin/zithromax to levaquin,  mucinex      -SLP evaluation completed D2 diet and thickened liquids ordered      -Sputum cx negative, blood Cx-NGTD      -upgrade diet as tol  2. PAF      - rate controlled, continue amiodarone, no anticoagulants due to fall risk      - continue ASA  3. HTN       - BP on higher side, continue amlodipine, lisinopril  4. Dementia      -mild, stable  5. Falls        -ambulate,  Orthostatics positive on admission, s/p IVF for 2days,, Pt/OT eval        -ambulate, OOB  6. HLD - continue lipitor  7.   Constipation       -add miralax, senokot, ambulate     -downgrade diet if needed  DVT proph: lovenox  Code Status: DNR Family Communication: none at bedside Disposition Plan: SNF in 1-2days   HPI/Subjective: abd bloated, no BM in 2days  Objective: Filed Vitals:   04/28/15 2217 04/29/15 0629  BP: 161/73 155/86  Pulse: 72 75  Temp: 98.2 F (36.8 C) 97.7 F (36.5 C)  Resp: 16 16    Intake/Output Summary (Last 24 hours) at 04/29/15 1553 Last data filed at 04/29/15 1417  Gross per 24 hour  Intake   1200 ml  Output   1050 ml  Net    150 ml   Filed Weights   04/25/15 1412 04/25/15 2057  Weight: 48.081 kg (106 lb) 51.574 kg (113 lb 11.2 oz)    Exam:   General:  Alert, awake, oriented to self and place, bruises all over, arms, face, under eyes, neck  Cardiovascular:  S1S2/RRR  Respiratory: ronchi on R  Abdomen: soft, distended, Nt, BS present  Musculoskeletal: no edema    Data Reviewed: Basic Metabolic Panel:  Recent Labs Lab 04/25/15 1430 04/27/15 0514 04/28/15 0550  NA 142 142 140  K 3.2* 3.5 3.9  CL 108 108 103  CO2 28 27 30   GLUCOSE 149* 94 125*  BUN 28* 13 14  CREATININE 0.64 0.49 0.66  CALCIUM 8.8* 8.5* 8.9   Liver Function Tests:  Recent Labs Lab 04/25/15 1430  AST 112*  ALT 143*  ALKPHOS 73  BILITOT 0.6  PROT 6.3*  ALBUMIN 2.7*    Recent Labs Lab 04/25/15 1430  LIPASE 53*   No results for input(s): AMMONIA in the last 168 hours. CBC:  Recent Labs Lab 04/25/15 1430 04/27/15 0514 04/28/15 0550  WBC 9.5 6.6 7.7  NEUTROABS 8.3*  --   --   HGB 11.8* 11.0* 12.3  HCT 37.0 33.6* 37.2  MCV 103.4* 102.1* 102.2*  PLT 230 241 291   Cardiac Enzymes:  Recent Labs Lab 04/25/15 1430  TROPONINI <0.03   BNP (last 3 results)  Recent Labs  07/19/14 1818  BNP 191.0*  ProBNP (last 3 results) No results for input(s): PROBNP in the last 8760 hours.  CBG:  Recent Labs Lab 04/25/15 1451  GLUCAP 140*    Recent Results (from the past 240 hour(s))  Culture, blood (Routine X 2) w Reflex to ID Panel     Status: None (Preliminary result)   Collection Time: 04/25/15  5:30 PM  Result Value Ref Range Status   Specimen Description BLOOD LEFT AC  Final   Special Requests BOTTLES DRAWN AEROBIC AND ANAEROBIC EACH  Final   Culture   Final    NO GROWTH 3 DAYS Performed at [facility]    Report Status PENDING  Incomplete  Culture, blood (Routine X 2) w Reflex to ID Panel     Status: None (Preliminary result)   Collection Time: 04/25/15  5:40 PM  Result Value Ref Range Status   Specimen Description BLOOD LEFT HAND  Final   Special Requests BOTTLES DRAWN AEROBIC AND ANAEROBIC EACH  Final   Culture   Final    NO GROWTH 3 DAYS Performed at [facility]    Report Status PENDING  Incomplete   MRSA PCR Screening     Status: None   Collection Time: 04/26/15  5:17 AM  Result Value Ref Range Status   MRSA by PCR NEGATIVE NEGATIVE Final    Comment:        The GeneXpert MRSA Assay (FDA approved for NASAL specimens only), is one component of a comprehensive MRSA colonization surveillance program. It is not intended to diagnose MRSA infection nor to guide or monitor treatment for MRSA infections.   Culture, sputum-assessment     Status: None   Collection Time: 04/27/15  7:05 AM  Result Value Ref Range Status   Specimen Description SPUTUM  Final   Special Requests NONE  Final   Sputum evaluation   Final    MICROSCOPIC FINDINGS SUGGEST THAT THIS SPECIMEN IS NOT REPRESENTATIVE OF LOWER RESPIRATORY SECRETIONS. PLEASE RECOLLECT. Gram Stain Report Called to,Read Back By and Verified With: Marylee Floras AT 0831 ON [id number] BY Lucienne Capers    Report Status 04/27/2015 FINAL  Final     Studies: No results found.  Scheduled Meds: . amiodarone  200 mg Oral Daily  . amLODipine  5 mg Oral Daily  . aspirin EC  325 mg Oral QPM  . atorvastatin  40 mg Oral q1800  . enoxaparin (LOVENOX) injection  40 mg Subcutaneous Q24H  . feeding supplement (ENSURE ENLIVE)  237 mL Oral TID BM  . fluticasone  2 spray Each Nare Daily  . folic acid  1 mg Oral Daily  . guaiFENesin  600 mg Oral BID  . latanoprost  1 drop Both Eyes QHS  . lisinopril  40 mg Oral Daily  . memantine  10 mg Oral BID  . pantoprazole  40 mg Oral Daily  . polyethylene glycol  17 g Oral BID  . senna-docusate  1 tablet Oral BID   Continuous Infusions:   Antibiotics Given (last 72 hours)    Date/Time Action Medication Dose Rate   04/26/15 1724 Given   azithromycin (ZITHROMAX) tablet 500 mg 500 mg    04/26/15 2217 Given   cefTRIAXone (ROCEPHIN) 1 g in dextrose 5 % 50 mL IVPB 1 g 100 mL/hr   04/27/15 1735 Given   azithromycin (ZITHROMAX) tablet 500 mg 500 mg    04/27/15 2100 Given   cefTRIAXone (ROCEPHIN) 1 g in dextrose  5 % 50 mL IVPB  1 g 100 mL/hr   04/28/15 1837 Given   azithromycin (ZITHROMAX) tablet 500 mg 500 mg    04/28/15 2143 Given   cefTRIAXone (ROCEPHIN) 1 g in dextrose 5 % 50 mL IVPB 1 g 100 mL/hr      Principal Problem:   Right upper lobe pneumonia Active Problems:   HTN (hypertension)   Atrial fibrillation (HCC)   Fall   Community acquired pneumonia    Time spent: 35min    [facility],Josephus Harr[facility]  [facility] Pager [phone]. If 7PM-7AM, please contact night-coverage at [email or web address], password [email or web address] 04/29/2015, 3:53 PM  LOS: 3 days

## 2015-04-29 NOTE — Progress Notes (Signed)
Physical Therapy Treatment Patient Details Name: Jacqueline LimesJacqueline A Addison MRN: [id number] DOB: 1928/04/24 Today's Date: 04/29/2015    History of Present Illness Jacqueline [name] is a 82 y.o. female with a diagnosis of community acquired pneumonia. Pt has PMH of dementia, coronary artery disease status post CABG 5, remote paroxysmal A. fib followed by cardiology on no anticoagulation, hypertension, hyperlipidemia, GERD, osteoporosis, diverticulitis, compression fracture T12, who presents with a generalized weakness and increased urinary frequency.    PT Comments    Pt required +2 mod assist to ambulate 3 feet with the chair followed behind. Pt's SpO2 to 83% and Pt reported dizziness. Pt sat down, and SpO2 rose to 96% on room air. Pt then completed general lower extremity exercises, and tolerated them very well maintaining SpO2 in the high-90's.   Follow Up Recommendations  SNF           Precautions / Restrictions Precautions Precautions: Fall Precaution Comments: Pt requires +2 with mod assist for transfers and ambulation for safety Restrictions Weight Bearing Restrictions: No    Mobility                Transfers Overall transfer level: Needs assistance Equipment used: Rolling walker (2 wheeled) Transfers: Sit to/from Stand Sit to Stand: Mod assist;+2 physical assistance         General transfer comment: Pt requires bilateral UE support on RW and +2 mod assist to complete sit to stand transfer and ambulate 3 feet with chair followed behind. Pt takes very small steps, but was able to take larger steps when verbally cued. Pt has a kyphotic posture.  Ambulation/Gait Ambulation/Gait assistance: Mod assist;+2 physical assistance Ambulation Distance (Feet): 3 Feet Assistive device: Rolling walker (2 wheeled) Gait Pattern/deviations: Step-to pattern;Shuffle;Antalgic;Decreased stride length Gait velocity: decreased   General Gait Details: Pt shuffles feet and takes very  small steps. Pt is able to take larger steps when verbally cued, but is limited in her ability to ambulate due to both fatigue and pain.           Balance Overall balance assessment: Needs assistance Sitting-balance support: Feet supported;Single extremity supported Sitting balance-Leahy Scale: Fair Sitting balance - Comments: Pt able to maintain sitting balance in chair, but Pt holds a slumped posture.   Standing balance support: Bilateral upper extremity supported Standing balance-Leahy Scale: Poor Standing balance comment: Pt requires +2 mod assist and RW for support and safety                     Cognition Arousal/Alertness: Awake/alert Behavior During Therapy: WFL for tasks assessed/performed Overall Cognitive Status: History of cognitive impairments - at baseline                      Exercises General Exercises - Lower Extremity Hip ABduction/ADduction: AROM;Seated;Both;20 reps Hip Flexion/Marching: AROM;Both;20 reps;Seated Toe Raises: AROM;20 reps;Both Heel Raises: AROM;Both;20 reps    General Comments General comments (skin integrity, edema, etc.): SpO2 to 83 after ambulating 3 feet. Pt reported dizziness, and then took a seat. SpO2 then rose to 96% after seated rest break. Pt is on room air.      Pertinent Vitals/Pain Pain Assessment: 0-10 Pain Score: 7  Pain Location: abdomen  Pain Descriptors / Indicators: Grimacing Pain Intervention(s): Limited activity within patient's tolerance;Monitored during session;Repositioned           PT Goals (current goals can now be found in the care plan section) Acute Rehab PT Goals Patient Stated Goal: decrease pain  and go home Potential to Achieve Goals: Fair Progress towards PT goals: Progressing toward goals    Frequency  Min 3X/week    PT Plan Discharge plan needs to be updated    Co-evaluation PT/OT/SLP Co-Evaluation/Treatment: Yes Reason for Co-Treatment: Complexity of the patient's  impairments (multi-system involvement);For patient/therapist safety PT goals addressed during session: Mobility/safety with mobility;Proper use of DME;Strengthening/ROM       End of Session Equipment Utilized During Treatment: Gait belt Activity Tolerance: Patient limited by fatigue;Patient limited by pain Patient left: in chair;with call bell/phone within reach     Time: 1030-1055 PT Time Calculation (min) (ACUTE ONLY): 25 min  Charges:  $Therapeutic Exercise: 8-22 mins 1 therapeutic exercise                    [place], [place] [phone] office  Chase Picket 04/29/2015, 11:37 AM

## 2015-04-29 NOTE — Evaluation (Signed)
Occupational Therapy Evaluation Patient Details Name: Jacqueline LimesJacqueline A Warehime MRN: [id number] DOB: [birth year] Today's Date: 04/29/2015    History of Present Illness Jacqueline [name] is a 82 y.o. female with a diagnosis of community acquired pneumonia. Pt has PMH of dementia, coronary artery disease status post CABG 5, remote paroxysmal A. fib followed by cardiology on no anticoagulation, hypertension, hyperlipidemia, GERD, osteoporosis, diverticulitis, compression fracture T12, who presents with a generalized weakness and increased urinary frequency.   Clinical Impression   Pt reports she was independent with ADLs PTA but unsure pt is a good historian secondary to mild dementia. Pt lives at home with her son and daughter in law but has been staying in ALF since they have been out of town. Pt with difficulty recalling home set up; states that it has been so long since she has been there she cannot really remember a lot of details. Pt currently mod assist +2 for sit to stand and short functional mobility in room. Pt overall min assist for LB ADLs, and set up for UB ADLs and grooming activities in sitting. Recommending SNF for further rehab in order to maximize independence and safety with ADLs and functional mobility. Pt would benefit from continued skilled OT in order to increase independence and safety with LB ADLs, toilet transfers, and grooming activities in standing.     Follow Up Recommendations  SNF;Supervision/Assistance - 24 hour    Equipment Recommendations  None recommended by OT    Recommendations for Other Services       Precautions / Restrictions Precautions Precautions: Fall Restrictions Weight Bearing Restrictions: No      Mobility Bed Mobility               General bed mobility comments: Pt OOB in chair  Transfers Overall transfer level: Needs assistance Equipment used: Rolling walker (2 wheeled) Transfers: Sit to/from Stand Sit to Stand: Mod assist;+2  physical assistance         General transfer comment: Mod assist +2 to boost up from chair. Mod assist +2 required in standing for balance and short functional mobility in room.     Balance Overall balance assessment: Needs assistance Sitting-balance support: Feet supported;Single extremity supported Sitting balance-Leahy Scale: Fair     Standing balance support: Bilateral upper extremity supported Standing balance-Leahy Scale: Poor Standing balance comment: Relies on RW for support                            ADL Overall ADL's : Needs assistance/impaired Eating/Feeding: Set up;Sitting   Grooming: Set up;Sitting   Upper Body Bathing: Supervision/ safety;Sitting;Set up   Lower Body Bathing: Minimal assistance;Sit to/from stand   Upper Body Dressing : Supervision/safety;Sitting;Set up   Lower Body Dressing: Minimal assistance;Sit to/from stand Lower Body Dressing Details (indicate cue type and reason): Pt able to pull up bilateral socks; reports it is painful but is able to manage. Will likely require assist to pull up pants in standing; noted pt requires significant assist from RW in standing. Toilet Transfer: Moderate assistance;+2 for safety/equipment;Stand-pivot;BSC;RW Toilet Transfer Details (indicate cue type and reason): Mod assist +2 for sit to stand, mod assist +2 for stand pivot trasfer. Simulated by sit to stand from chair with a few steps for functional mobility. Toileting- Clothing Manipulation and Hygiene: Moderate assistance;Sitting/lateral lean       Functional mobility during ADLs: Moderate assistance;+2 for physical assistance;Rolling walker General ADL Comments: No family present for OT eval. Pt  is oriented to person, place and situation at this time. Pt has difficulty recalling home setup stating that she has been away from home for too long and cannot recall.      Vision     Perception     Praxis      Pertinent Vitals/Pain Pain  Assessment: 0-10 Pain Score: 9  Pain Location: abdomen with mobility, 7 at rest Pain Descriptors / Indicators: Grimacing;Guarding;Aching Pain Intervention(s): Limited activity within patient's tolerance;Monitored during session;Patient requesting pain meds-RN notified     Hand Dominance Right   Extremity/Trunk Assessment Upper Extremity Assessment Upper Extremity Assessment: Generalized weakness   Lower Extremity Assessment Lower Extremity Assessment: Defer to PT evaluation   Cervical / Trunk Assessment Cervical / Trunk Assessment: Kyphotic   Communication Communication Communication: No difficulties   Cognition Arousal/Alertness: Awake/alert Behavior During Therapy: WFL for tasks assessed/performed Overall Cognitive Status: History of cognitive impairments - at baseline                     General Comments       Exercises       Shoulder Instructions      Home Living Family/patient expects to be discharged to:: Private residence (in ALF while family out of town) Living Arrangements: Children Available Help at Discharge: Family;Available PRN/intermittently Type of Home: House       Home Layout: One level     Bathroom Shower/Tub:  (unsure)   Bathroom Toilet: Standard     Home Equipment: Environmental consultant - 2 wheels;Bedside commode;Shower seat;Grab bars - tub/shower   Additional Comments: Pt reports that she cannot remember some home setup/equipment because she has been gone from her home for so long.      Prior Functioning/Environment Level of Independence: Needs assistance  Gait / Transfers Assistance Needed: Independently used 2-wheeled RW ADL's / Homemaking Assistance Needed: Pt reports she has been completing ADLs independently.   Comments: Pt reports living with her son and daughter in law. Pt was put in a ALF due to her son having a procedure out of town. Pt fell while in ALF.    OT Diagnosis: Generalized weakness;Acute pain;Cognitive deficits   OT  Problem List: Decreased strength;Decreased activity tolerance;Impaired balance (sitting and/or standing);Decreased safety awareness;Decreased knowledge of use of DME or AE;Decreased knowledge of precautions;Pain   OT Treatment/Interventions: Self-care/ADL training;DME and/or AE instruction;Energy conservation;Therapeutic activities;Patient/family education;Balance training    OT Goals(Current goals can be found in the care plan section) Acute Rehab OT Goals Patient Stated Goal: None stated OT Goal Formulation: With patient Time For Goal Achievement: 05/13/15 Potential to Achieve Goals: Good ADL Goals Pt Will Perform Grooming: with min guard assist;standing Pt Will Perform Lower Body Bathing: with min guard assist;sit to/from stand Pt Will Perform Lower Body Dressing: with min guard assist;sit to/from stand Pt Will Transfer to Toilet: ambulating;bedside commode;with min assist (over toilet) Pt Will Perform Toileting - Clothing Manipulation and hygiene: with min guard assist;sit to/from stand  OT Frequency: Min 2X/week   Barriers to D/C:            Co-evaluation              End of Session Equipment Utilized During Treatment: Gait belt;Rolling walker Nurse Communication: Patient requests pain meds  Activity Tolerance: Patient limited by pain;Patient limited by fatigue Patient left: in chair;with call bell/phone within reach;with chair alarm set;Other (comment) (with PT)   Time: 1610-9604 OT Time Calculation (min): 12 min Charges:  OT General Charges $OT Visit: 1  Procedure OT Evaluation $OT Eval Moderate Complexity: 1 Procedure G-Codes:     Gaye Alken M.S., OTR/L Pager: [phone]  04/29/2015, 11:06 AM

## 2015-04-29 NOTE — Care Management Important Message (Signed)
Important Message  Patient Details  Name: Jacqueline LimesJacqueline A Jessop MRN: [id number] Date of Birth: [birth year]   Medicare Important Message Given:  Yes    Estefana Taylor P Rhanda Lemire 04/29/2015, 4:26 PM

## 2015-04-30 LAB — BASIC METABOLIC PANEL
ANION GAP: 9 (ref 5–15)
BUN: 15 mg/dL (ref 6–20)
CHLORIDE: 101 mmol/L (ref 101–111)
CO2: 28 mmol/L (ref 22–32)
Calcium: 8.8 mg/dL — ABNORMAL LOW (ref 8.9–10.3)
Creatinine, Ser: 0.59 mg/dL (ref 0.44–1.00)
GFR calc non Af Amer: >60 mL/min (ref >60)
Glucose, Bld: 90 mg/dL (ref 65–99)
POTASSIUM: 4.5 mmol/L (ref 3.5–5.1)
SODIUM: 138 mmol/L (ref 135–145)

## 2015-04-30 LAB — CBC
HEMATOCRIT: 37.5 % (ref 36.0–46.0)
HEMOGLOBIN: 12.1 g/dL (ref 12.0–15.0)
MCH: 33.1 pg (ref 26.0–34.0)
MCHC: 32.3 g/dL (ref 30.0–36.0)
MCV: 102.5 fL — AB (ref 78.0–100.0)
PLATELETS: 342 10*3/uL (ref 150–400)
RBC: 3.66 MIL/uL — AB (ref 3.87–5.11)
RDW: 15.2 % (ref 11.5–15.5)
WBC: 7.9 10*3/uL (ref 4.0–10.5)

## 2015-04-30 MED ORDER — SENNOSIDES-DOCUSATE SODIUM 8.6-50 MG PO TABS: 1.0000 | ORAL_TABLET | Freq: Two times a day (BID) | ORAL | Status: DC

## 2015-04-30 MED ORDER — MENTHOL 3 MG MT LOZG: 1.0000 | LOZENGE | Freq: Once | OROMUCOSAL | Status: DC

## 2015-04-30 MED ORDER — LEVOFLOXACIN 500 MG PO TABS: 500.0000 mg | ORAL_TABLET | Freq: Every day | ORAL | Status: DC

## 2015-04-30 MED ORDER — ACETAMINOPHEN 325 MG PO TABS: 650.0000 mg | ORAL_TABLET | Freq: Four times a day (QID) | ORAL | Status: AC | PRN

## 2015-04-30 MED ORDER — DIAZEPAM 2 MG PO TABS: ORAL_TABLET | ORAL | Status: DC

## 2015-04-30 NOTE — Discharge Summary (Signed)
Physician Discharge Summary  Jacqueline [name] ZOX:[id number] DOB: 1928/06/04 DOA: 04/25/2015  PCP: [name], MD  Admit date: 04/25/2015 Discharge date: 04/30/2015  Time spent: 45 minutes  1. PCP in 1 week, needs SLP FU in 2 weeks 2. Needs FU CXR in 4-6weeks   Discharge Diagnoses:  Principal Problem:   Right upper lobe pneumonia Active Problems:   HTN (hypertension)   Atrial fibrillation (HCC)   Fall   Community acquired pneumonia   Mild Dementia   Dyslipidemia   Constipation  Discharge Condition: stable  Diet recommendation: Dysphagia 2 diet with nectar thick liquids  Filed Weights   04/25/15 1412 04/25/15 2057  Weight: 48.081 kg (106 lb) 51.574 kg (113 lb 11.2 oz)    History of present illness:  Chief Complaint: Falls HPI: Jacqueline [name] is a 82 y.o. female with h/o mild dementia, CABG, HTN, A.Fib on amiodarone but not on anticoagulants due to fall risk. Patient is in an ALF currently while her family is out of town (usually lives with family and is cared for at home). Patient was sent to ED with new onset of confusion per her PCP. She was seeing PCP today due to at least 2 falls at the ALF. She had numerous bruises., Patient has had cough recently, although she is unsure when this began  Hospital Course:  1. Community acquired pneumonia  -improving, small area of ground glass attenuation in RUL, wet sounding cough/congestion  -change rocephin/zithromax to levaquin, mucinex  -SLP evaluation completed D2 diet and thickened liquids recommended  -Sputum cx negative, blood Cx-NGTD  -upgrade diet as tol  2. Dysphagia       -s/p SLP evaluation,  D2 diet and thickened liquids recommended       -needs SLP FU at SNF  3. PAF  - rate controlled, continue amiodarone, no anticoagulants due to fall risk  - continue ASA  3. HTN  - BP on higher side, continue amlodipine, lisinopril  4. Dementia  -mild, stable,  continue namenda  5. Falls  -Orthostatics positive on admission, s/p IVF for 2days,, Pt/OT eval completed  -SNF for rehab  6. HLD - continue lipitor  7. Constipation  -large BM yesterday after miralax and senokot, continue this  Discharge Exam: Filed Vitals:   04/29/15 2343 04/30/15 0529  BP: 151/92 155/82  Pulse:  74  Temp:  98.2 F (36.8 C)  Resp:  18    General: AAOx3 Cardiovascular: S1S2/RRR Respiratory: CTAB  Discharge Instructions   Discharge Instructions    Increase activity slowly    Complete by:  As directed           Current Discharge Medication List    START taking these medications   Details  acetaminophen (TYLENOL) 325 MG tablet Take 2 tablets (650 mg total) by mouth every 6 (six) hours as needed for mild pain or headache.    levofloxacin (LEVAQUIN) 500 MG tablet Take 1 tablet (500 mg total) by mouth daily. For 3days Qty: 3 tablet, Refills: 0    senna-docusate (SENOKOT-S) 8.6-50 MG tablet Take 1 tablet by mouth 2 (two) times daily.      CONTINUE these medications which have CHANGED   Details  diazepam (VALIUM) 2 MG tablet 1/2 tab daily and then use other 1/2 as needed for high anxiety moments Qty: 10 tablet, Refills: 0      CONTINUE these medications which have NOT CHANGED   Details  alendronate (FOSAMAX) 70 MG tablet TAKE 1 TABLET BY MOUTH EVERY 7  DAYS. TAKE WITH A FULL GLASS OF WATER ON AN EMPTY STOMACH Qty: 12 tablet, Refills: 1    amiodarone (PACERONE) 200 MG tablet TAKE 1 TABLET BY MOUTH DAILY. Qty: 30 tablet, Refills: 3    amLODipine (NORVASC) 5 MG tablet Take 1 tablet (5 mg total) by mouth daily. Qty: 180 tablet, Refills: 3    aspirin EC 325 MG tablet Take 325 mg by mouth every evening.     atorvastatin (LIPITOR) 40 MG tablet TAKE 1 TABLET BY MOUTH DAILY AT 6 PM Qty: 90 tablet, Refills: 0    BESIVANCE 0.6 % SUSP PLACE 1 DROP IN RIGHT EYE ONE TIME  A DAY Refills: 1    Difluprednate (DUREZOL) 0.05 % EMUL  Apply 1 drop to eye 3 (three) times daily. One drop in left eye three times daily    feeding supplement, ENSURE ENLIVE, (ENSURE ENLIVE) LIQD Take 237 mLs by mouth 3 (three) times daily between meals. Qty: 237 mL, Refills: 12    folic acid (FOLVITE) 1 MG tablet TAKE 1 TABLET BY MOUTH EVERY DAY Qty: 90 tablet, Refills: 1    ILEVRO 0.3 % ophthalmic suspension INSTILL 1 DROP IN RIGHT EYE AT AFTERNOON Refills: 1    lisinopril (PRINIVIL,ZESTRIL) 40 MG tablet Take 1 tablet (40 mg total) by mouth daily. Qty: 90 tablet, Refills: 3   Associated Diagnoses: Essential hypertension; Atrial fibrillation, unspecified    LUMIGAN 0.01 % SOLN PLACE 1 DROP INTO BOTH EYES AT BEDTIME. Refills: 0    memantine (NAMENDA) 10 MG tablet TAKE 1 TABLET (10 MG TOTAL) BY MOUTH 2 (TWO) TIMES DAILY. Qty: 60 tablet, Refills: 9    metoprolol tartrate (LOPRESSOR) 25 MG tablet Take 1 tablet by mouth 2 (two) times daily. Refills: 1    polyethylene glycol (MIRALAX / GLYCOLAX) packet Take 17 g by mouth daily as needed. Qty: 14 each, Refills: 0      STOP taking these medications     fluticasone (FLONASE) 50 MCG/ACT nasal spray      omeprazole (PRILOSEC) 20 MG capsule        No Known Allergies    The results of significant diagnostics from this hospitalization (including imaging, microbiology, ancillary and laboratory) are listed below for reference.    Significant Diagnostic Studies: Ct Head Wo Contrast  04/25/2015  CLINICAL DATA:  Fall.  Altered mental status.  Bruises. EXAM: CT HEAD WITHOUT CONTRAST CT MAXILLOFACIAL WITHOUT CONTRAST CT CERVICAL SPINE WITHOUT CONTRAST TECHNIQUE: Multidetector CT imaging of the head, cervical spine, and maxillofacial structures were performed using the standard protocol without intravenous contrast. Multiplanar CT image reconstructions of the cervical spine and maxillofacial structures were also generated. COMPARISON:  04/17/2015 head, cervical spine and maxillofacial CT.  FINDINGS: CT HEAD FINDINGS Left frontal scalp contusion is decreased since 04/17/2015. No evidence of parenchymal hemorrhage or extra-axial fluid collection. No mass lesion, mass effect, or midline shift. No CT evidence of acute infarction. Intracranial atherosclerosis. Diffuse cerebral volume loss. Nonspecific prominent stable subcortical and periventricular white matter hypodensity, most in keeping with chronic small vessel ischemic change. Stable small right lentiform nucleus lacunar infarct. No ventriculomegaly. There are new small air-fluid levels in the bilateral maxillary sinuses, with associated mucosal thickening in the bilateral maxillary sinuses. Partial opacification of the bilateral ethmoidal air cells. Stable tiny mucous retention cyst versus polyp in the posterior left frontal sinus. Mild mucosal thickening and inspissated secretions in the sphenoid sinus. New partial opacification of the right mastoid air cells. Left mastoid air cells are unopacified. No  evidence of calvarial fracture. CT MAXILLOFACIAL FINDINGS Re- demonstration of small left frontal scalp contusion. No maxillofacial fracture. The maxilla and mandible appear intact. The nasal bones are unremarkable in appearance. There is stable levocurvature of the intact appearing nasal septum. No dislocation at the temporomandibular joints. 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 The visualized dentition demonstrates no acute abnormality. The orbits are intact bilaterally. Re- demonstration of a new small fluid levels in the bilateral maxillary sinuses with associated mild mucosal thickening in the bilateral maxillary sinuses. Partial opacification of the bilateral ethmoidal air cells. Stable tiny mucous retention cyst versus polyp in the posterior left frontal sinus. Mild mucosal thickening with mild inspissated secretions in the sphenoid sinus. No partial opacification of right mastoid air cells. Unopacified left mastoid air  cells. No aggressive appearing focal osseous lesions. The parapharyngeal fat planes are preserved. The nasopharynx, oropharynx and hypopharynx are unremarkable in appearance. The parotid and submandibular glands are within normal limits. No cervical lymphadenopathy is seen. CT CERVICAL SPINE FINDINGS No fracture is detected in the cervical spine. Stable subacute to chronic moderate T3 vertebral compression fracture with approximately 40% loss of vertebral body height, unchanged since 04/17/2015. No prevertebral soft tissue swelling. Normal cervical lordosis. Dens is well positioned between the lateral masses of C1. The lateral masses appear well-aligned. Moderate degenerative disc disease throughout the cervical spine, most prominent at C6-7. Moderate facet arthropathy in the cervical spine, asymmetric to the left. No significant cervical foraminal stenosis. No cervical spine subluxation. New partial opacification of the right mastoid air cells. Visualized left mastoid air cells appear unopacified. No evidence of intra-axial hemorrhage in the visualized brain. No gross cervical canal hematoma. Apical right upper lobe 4 mm pulmonary nodule (series 12/ image 73), stable since 04/17/2015. No cervical adenopathy or other significant neck soft tissue abnormality. IMPRESSION: 1. Small left frontal scalp contusion, decreased since 04/17/2015. No evidence of acute intracranial abnormality. No calvarial fracture. 2. No maxillofacial fracture. 3. No fracture or subluxation in the cervical spine. 4. Subacute to chronic moderate T3 vertebral compression fracture, stable. 5. Acute paranasal sinusitis, worsened. New partial opacification of the right mastoid air cells, nonspecific, favor a mild infectious or inflammatory right mastoiditis. 6. Diffuse cerebral atrophy, prominent chronic small vessel ischemic white matter change and chronic right basal ganglia lacunar infarct. Electronically Signed   By: Delbert Phenix M.D.   On:  04/25/2015 16:14   Ct Head Wo Contrast  04/17/2015  CLINICAL DATA:  Tripped and fall in bathroom, bruising and swelling between the eyes and along the left face with facial bruising. Dementia. EXAM: CT HEAD WITHOUT CONTRAST CT MAXILLOFACIAL WITHOUT CONTRAST CT CERVICAL SPINE WITHOUT CONTRAST TECHNIQUE: Multidetector CT imaging of the head, cervical spine, and maxillofacial structures were performed using the standard protocol without intravenous contrast. Multiplanar CT image reconstructions of the cervical spine and maxillofacial structures were also generated. COMPARISON:  08/10/2013 FINDINGS: CT HEAD FINDINGS Remote right lentiform nucleus lacunar infarct. Periventricular white matter and corona radiata hypodensities favor chronic ischemic microvascular white matter disease. No intracranial hemorrhage, mass lesion, or acute CVA. Left forehead scalp hematoma. Chronic ethmoid, maxillary, left sphenoid, and frontal sinusitis. There is atherosclerotic calcification of the cavernous carotid arteries bilaterally. CT MAXILLOFACIAL FINDINGS Left forehead scalp hematoma extending from the midline laterally. No underlying fracture. Chronic ethmoid, frontal, sphenoid, and maxillary sinusitis. The mastoid air cells and middle ears appear clear. No acute facial fracture identified. Degenerative findings of the left mandibular condyle. No significant intraorbital abnormality. Left periorbital soft tissue swelling  with abnormal soft tissue swelling overlying the left maxilla and face. Atherosclerosis noted. CT CERVICAL SPINE FINDINGS Calcification in the transverse ligament of C1. 40% anterior wedge compression fracture of T3 with 3 mm posterior bony retropulsion and sclerosis along the superior margin of the fracture. This fracture is thought to be at least 38-month-old. Multilevel uncinate and facet spurring but without osseous foraminal impingement. No malalignment or acute fracture identified. Mild biapical pleural  parenchymal scarring. IMPRESSION: 1. Left forehead, periorbital, and left facial soft tissue swelling without underlying bony or orbital abnormality. 2. No acute intracranial findings. 3. Periventricular white matter and corona radiata hypodensities favor chronic ischemic microvascular white matter disease. 4. Remote right lentiform nucleus lacunar infarct. 5. Chronic paranasal sinusitis. 6. 40% anterior wedge compression fracture of the T3 vertebral body with 3 mm posterior bony retropulsion -this fractures thought to be at least 58-month-old but probably less than a year old. 7. Multilevel spondylosis in the cervical spine, without cervical spine fracture or subluxation. No bony foraminal impingement. Electronically Signed   By: Gaylyn Rong M.D.   On: 04/17/2015 13:39   Ct Chest W Contrast  04/25/2015  CLINICAL DATA:  Status post fall last night. Pain and bruising. Initial encounter. EXAM: CT CHEST, ABDOMEN, AND PELVIS WITH CONTRAST TECHNIQUE: Multidetector CT imaging of the chest, abdomen and pelvis was performed following the standard protocol during bolus administration of intravenous contrast. CONTRAST:  100 ML OMNIPAQUE IOHEXOL 300 MG/ML  SOLN COMPARISON:  CT abdomen and pelvis 01/12/2015. PA and lateral chest 01/12/2015. MRI abdomen 12/21/2014. FINDINGS: CT CHEST FINDINGS Mediastinum/Lymph Nodes: No evidence of posttraumatic change is identified. There is cardiomegaly. Calcific aortic and coronary atherosclerosis is noted. The patient is status post CABG. Lungs/Pleura: Trace bilateral pleural effusions are seen. There is an area of ground-glass attenuation in the anterior right upper lobe. Dependent atelectasis is noted. Musculoskeletal: Remote T3 and T12 compression fractures are identified. Vertebral body height loss of T6 has progressed since the prior PA and lateral chest films. No acute fracture is identified. CT ABDOMEN PELVIS FINDINGS Hepatobiliary: A single small gallstone is identified. No  evidence of cholecystitis is seen. No biliary ductal dilatation. Pancreas: Multilocular cystic lesion in the tail the pancreas is unchanged. Spleen: Unremarkable. Adrenals/Urinary Tract: The adrenal glands appear normal. Renal cysts are unchanged. Stomach/Bowel: Surgical anastomosis in the sigmoid colon is noted. The colon is otherwise unremarkable. Stomach and small bowel appear normal. Vascular/Lymphatic: IVC filter is noted. The patient has extensive aortoiliac atherosclerosis without aneurysm. Reproductive: Status post hysterectomy. Other: None. Musculoskeletal: Right hip replacement is seen. Remote pubic ramus fractures are present bilaterally. Lower lumbar spondylosis is noted. IMPRESSION: Negative for trauma to the chest, abdomen or pelvis. Small area of ground-glass attenuation anterior right upper lobe may be due to infectious or inflammatory process. Cardiomegaly. Extensive aortic and coronary atherosclerosis. Single small gallstone without evidence of cholecystitis. No change in a small multilocular cystic lesion in the tail of the pancreas. Remote T3, T6 and T12 compression fractures. Vertebral body height loss of T6 has progressed since the prior examinations. Remote pubic ramus fractures are also noted. Electronically Signed   By: Drusilla Kanner M.D.   On: 04/25/2015 16:03   Ct Cervical Spine Wo Contrast  04/25/2015  CLINICAL DATA:  Fall.  Altered mental status.  Bruises. EXAM: CT HEAD WITHOUT CONTRAST CT MAXILLOFACIAL WITHOUT CONTRAST CT CERVICAL SPINE WITHOUT CONTRAST TECHNIQUE: Multidetector CT imaging of the head, cervical spine, and maxillofacial structures were performed using the standard protocol without intravenous contrast.  Multiplanar CT image reconstructions of the cervical spine and maxillofacial structures were also generated. COMPARISON:  04/17/2015 head, cervical spine and maxillofacial CT. FINDINGS: CT HEAD FINDINGS Left frontal scalp contusion is decreased since 04/17/2015. No  evidence of parenchymal hemorrhage or extra-axial fluid collection. No mass lesion, mass effect, or midline shift. No CT evidence of acute infarction. Intracranial atherosclerosis. Diffuse cerebral volume loss. Nonspecific prominent stable subcortical and periventricular white matter hypodensity, most in keeping with chronic small vessel ischemic change. Stable small right lentiform nucleus lacunar infarct. No ventriculomegaly. There are new small air-fluid levels in the bilateral maxillary sinuses, with associated mucosal thickening in the bilateral maxillary sinuses. Partial opacification of the bilateral ethmoidal air cells. Stable tiny mucous retention cyst versus polyp in the posterior left frontal sinus. Mild mucosal thickening and inspissated secretions in the sphenoid sinus. New partial opacification of the right mastoid air cells. Left mastoid air cells are unopacified. No evidence of calvarial fracture. CT MAXILLOFACIAL FINDINGS Re- demonstration of small left frontal scalp contusion. No maxillofacial fracture. The maxilla and mandible appear intact. The nasal bones are unremarkable in appearance. There is stable levocurvature of the intact appearing nasal septum. No dislocation at the temporomandibular joints. 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 The visualized dentition demonstrates no acute abnormality. The orbits are intact bilaterally. Re- demonstration of a new small fluid levels in the bilateral maxillary sinuses with associated mild mucosal thickening in the bilateral maxillary sinuses. Partial opacification of the bilateral ethmoidal air cells. Stable tiny mucous retention cyst versus polyp in the posterior left frontal sinus. Mild mucosal thickening with mild inspissated secretions in the sphenoid sinus. No partial opacification of right mastoid air cells. Unopacified left mastoid air cells. No aggressive appearing focal osseous lesions. The parapharyngeal fat planes are  preserved. The nasopharynx, oropharynx and hypopharynx are unremarkable in appearance. The parotid and submandibular glands are within normal limits. No cervical lymphadenopathy is seen. CT CERVICAL SPINE FINDINGS No fracture is detected in the cervical spine. Stable subacute to chronic moderate T3 vertebral compression fracture with approximately 40% loss of vertebral body height, unchanged since 04/17/2015. No prevertebral soft tissue swelling. Normal cervical lordosis. Dens is well positioned between the lateral masses of C1. The lateral masses appear well-aligned. Moderate degenerative disc disease throughout the cervical spine, most prominent at C6-7. Moderate facet arthropathy in the cervical spine, asymmetric to the left. No significant cervical foraminal stenosis. No cervical spine subluxation. New partial opacification of the right mastoid air cells. Visualized left mastoid air cells appear unopacified. No evidence of intra-axial hemorrhage in the visualized brain. No gross cervical canal hematoma. Apical right upper lobe 4 mm pulmonary nodule (series 12/ image 73), stable since 04/17/2015. No cervical adenopathy or other significant neck soft tissue abnormality. IMPRESSION: 1. Small left frontal scalp contusion, decreased since 04/17/2015. No evidence of acute intracranial abnormality. No calvarial fracture. 2. No maxillofacial fracture. 3. No fracture or subluxation in the cervical spine. 4. Subacute to chronic moderate T3 vertebral compression fracture, stable. 5. Acute paranasal sinusitis, worsened. New partial opacification of the right mastoid air cells, nonspecific, favor a mild infectious or inflammatory right mastoiditis. 6. Diffuse cerebral atrophy, prominent chronic small vessel ischemic white matter change and chronic right basal ganglia lacunar infarct. Electronically Signed   By: Delbert Phenix M.D.   On: 04/25/2015 16:14   Ct Cervical Spine Wo Contrast  04/17/2015  CLINICAL DATA:  Tripped and  fall in bathroom, bruising and swelling between the eyes and along the left face with facial  bruising. Dementia. EXAM: CT HEAD WITHOUT CONTRAST CT MAXILLOFACIAL WITHOUT CONTRAST CT CERVICAL SPINE WITHOUT CONTRAST TECHNIQUE: Multidetector CT imaging of the head, cervical spine, and maxillofacial structures were performed using the standard protocol without intravenous contrast. Multiplanar CT image reconstructions of the cervical spine and maxillofacial structures were also generated. COMPARISON:  08/10/2013 FINDINGS: CT HEAD FINDINGS Remote right lentiform nucleus lacunar infarct. Periventricular white matter and corona radiata hypodensities favor chronic ischemic microvascular white matter disease. No intracranial hemorrhage, mass lesion, or acute CVA. Left forehead scalp hematoma. Chronic ethmoid, maxillary, left sphenoid, and frontal sinusitis. There is atherosclerotic calcification of the cavernous carotid arteries bilaterally. CT MAXILLOFACIAL FINDINGS Left forehead scalp hematoma extending from the midline laterally. No underlying fracture. Chronic ethmoid, frontal, sphenoid, and maxillary sinusitis. The mastoid air cells and middle ears appear clear. No acute facial fracture identified. Degenerative findings of the left mandibular condyle. No significant intraorbital abnormality. Left periorbital soft tissue swelling with abnormal soft tissue swelling overlying the left maxilla and face. Atherosclerosis noted. CT CERVICAL SPINE FINDINGS Calcification in the transverse ligament of C1. 40% anterior wedge compression fracture of T3 with 3 mm posterior bony retropulsion and sclerosis along the superior margin of the fracture. This fracture is thought to be at least 78-month-old. Multilevel uncinate and facet spurring but without osseous foraminal impingement. No malalignment or acute fracture identified. Mild biapical pleural parenchymal scarring. IMPRESSION: 1. Left forehead, periorbital, and left facial soft  tissue swelling without underlying bony or orbital abnormality. 2. No acute intracranial findings. 3. Periventricular white matter and corona radiata hypodensities favor chronic ischemic microvascular white matter disease. 4. Remote right lentiform nucleus lacunar infarct. 5. Chronic paranasal sinusitis. 6. 40% anterior wedge compression fracture of the T3 vertebral body with 3 mm posterior bony retropulsion -this fractures thought to be at least 45-month-old but probably less than a year old. 7. Multilevel spondylosis in the cervical spine, without cervical spine fracture or subluxation. No bony foraminal impingement. Electronically Signed   By: Gaylyn Rong M.D.   On: 04/17/2015 13:39   Ct Abdomen Pelvis W Contrast  04/25/2015  CLINICAL DATA:  Status post fall last night. Pain and bruising. Initial encounter. EXAM: CT CHEST, ABDOMEN, AND PELVIS WITH CONTRAST TECHNIQUE: Multidetector CT imaging of the chest, abdomen and pelvis was performed following the standard protocol during bolus administration of intravenous contrast. CONTRAST:  100 ML OMNIPAQUE IOHEXOL 300 MG/ML  SOLN COMPARISON:  CT abdomen and pelvis 01/12/2015. PA and lateral chest 01/12/2015. MRI abdomen 12/21/2014. FINDINGS: CT CHEST FINDINGS Mediastinum/Lymph Nodes: No evidence of posttraumatic change is identified. There is cardiomegaly. Calcific aortic and coronary atherosclerosis is noted. The patient is status post CABG. Lungs/Pleura: Trace bilateral pleural effusions are seen. There is an area of ground-glass attenuation in the anterior right upper lobe. Dependent atelectasis is noted. Musculoskeletal: Remote T3 and T12 compression fractures are identified. Vertebral body height loss of T6 has progressed since the prior PA and lateral chest films. No acute fracture is identified. CT ABDOMEN PELVIS FINDINGS Hepatobiliary: A single small gallstone is identified. No evidence of cholecystitis is seen. No biliary ductal dilatation. Pancreas:  Multilocular cystic lesion in the tail the pancreas is unchanged. Spleen: Unremarkable. Adrenals/Urinary Tract: The adrenal glands appear normal. Renal cysts are unchanged. Stomach/Bowel: Surgical anastomosis in the sigmoid colon is noted. The colon is otherwise unremarkable. Stomach and small bowel appear normal. Vascular/Lymphatic: IVC filter is noted. The patient has extensive aortoiliac atherosclerosis without aneurysm. Reproductive: Status post hysterectomy. Other: None. Musculoskeletal: Right hip replacement is seen.  Remote pubic ramus fractures are present bilaterally. Lower lumbar spondylosis is noted. IMPRESSION: Negative for trauma to the chest, abdomen or pelvis. Small area of ground-glass attenuation anterior right upper lobe may be due to infectious or inflammatory process. Cardiomegaly. Extensive aortic and coronary atherosclerosis. Single small gallstone without evidence of cholecystitis. No change in a small multilocular cystic lesion in the tail of the pancreas. Remote T3, T6 and T12 compression fractures. Vertebral body height loss of T6 has progressed since the prior examinations. Remote pubic ramus fractures are also noted. Electronically Signed   By: Drusilla Kanner M.D.   On: 04/25/2015 16:03   Dg Chest Port 1 View  04/17/2015  CLINICAL DATA:  Productive cough for 2 days, history hypertension, coronary artery disease post CABG, former smoker EXAM: PORTABLE CHEST 1 VIEW COMPARISON:  Portable exam 1531 hours compared to 01/15/2015 FINDINGS: Rotated to the RIGHT. Enlargement of cardiac silhouette post CABG. Atherosclerotic calcification aorta. Mediastinal contours and pulmonary vascularity normal. Subsegmental atelectasis LEFT base. Lungs grossly clear. No pleural effusion or pneumothorax. Skin fold projects over RIGHT lung. Diffuse osseous demineralization. IMPRESSION: Enlargement of cardiac silhouette post CABG. LEFT basilar atelectasis. Electronically Signed   By: Ulyses Southward M.D.   On:  04/17/2015 15:45   Dg Hip Unilat With Pelvis 2-3 Views Left  04/17/2015  CLINICAL DATA:  Recent fall with left hip pain, initial encounter EXAM: DG HIP (WITH OR WITHOUT PELVIS) 2-3V LEFT COMPARISON:  None. FINDINGS: Postsurgical changes are noted in the right hip. Pelvic ring appears intact. No fracture or dislocation is seen. No gross soft tissue abnormality is noted. IMPRESSION: No acute abnormality noted. Electronically Signed   By: Alcide Clever M.D.   On: 04/17/2015 13:38   Ct Maxillofacial Wo Cm  04/25/2015  CLINICAL DATA:  Fall.  Altered mental status.  Bruises. EXAM: CT HEAD WITHOUT CONTRAST CT MAXILLOFACIAL WITHOUT CONTRAST CT CERVICAL SPINE WITHOUT CONTRAST TECHNIQUE: Multidetector CT imaging of the head, cervical spine, and maxillofacial structures were performed using the standard protocol without intravenous contrast. Multiplanar CT image reconstructions of the cervical spine and maxillofacial structures were also generated. COMPARISON:  04/17/2015 head, cervical spine and maxillofacial CT. FINDINGS: CT HEAD FINDINGS Left frontal scalp contusion is decreased since 04/17/2015. No evidence of parenchymal hemorrhage or extra-axial fluid collection. No mass lesion, mass effect, or midline shift. No CT evidence of acute infarction. Intracranial atherosclerosis. Diffuse cerebral volume loss. Nonspecific prominent stable subcortical and periventricular white matter hypodensity, most in keeping with chronic small vessel ischemic change. Stable small right lentiform nucleus lacunar infarct. No ventriculomegaly. There are new small air-fluid levels in the bilateral maxillary sinuses, with associated mucosal thickening in the bilateral maxillary sinuses. Partial opacification of the bilateral ethmoidal air cells. Stable tiny mucous retention cyst versus polyp in the posterior left frontal sinus. Mild mucosal thickening and inspissated secretions in the sphenoid sinus. New partial opacification of the right  mastoid air cells. Left mastoid air cells are unopacified. No evidence of calvarial fracture. CT MAXILLOFACIAL FINDINGS Re- demonstration of small left frontal scalp contusion. No maxillofacial fracture. The maxilla and mandible appear intact. The nasal bones are unremarkable in appearance. There is stable levocurvature of the intact appearing nasal septum. No dislocation at the temporomandibular joints. 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 The visualized dentition demonstrates no acute abnormality. The orbits are intact bilaterally. Re- demonstration of a new small fluid levels in the bilateral maxillary sinuses with associated mild mucosal thickening in the bilateral maxillary sinuses. Partial opacification of the bilateral  ethmoidal air cells. Stable tiny mucous retention cyst versus polyp in the posterior left frontal sinus. Mild mucosal thickening with mild inspissated secretions in the sphenoid sinus. No partial opacification of right mastoid air cells. Unopacified left mastoid air cells. No aggressive appearing focal osseous lesions. The parapharyngeal fat planes are preserved. The nasopharynx, oropharynx and hypopharynx are unremarkable in appearance. The parotid and submandibular glands are within normal limits. No cervical lymphadenopathy is seen. CT CERVICAL SPINE FINDINGS No fracture is detected in the cervical spine. Stable subacute to chronic moderate T3 vertebral compression fracture with approximately 40% loss of vertebral body height, unchanged since 04/17/2015. No prevertebral soft tissue swelling. Normal cervical lordosis. Dens is well positioned between the lateral masses of C1. The lateral masses appear well-aligned. Moderate degenerative disc disease throughout the cervical spine, most prominent at C6-7. Moderate facet arthropathy in the cervical spine, asymmetric to the left. No significant cervical foraminal stenosis. No cervical spine subluxation. New partial  opacification of the right mastoid air cells. Visualized left mastoid air cells appear unopacified. No evidence of intra-axial hemorrhage in the visualized brain. No gross cervical canal hematoma. Apical right upper lobe 4 mm pulmonary nodule (series 12/ image 73), stable since 04/17/2015. No cervical adenopathy or other significant neck soft tissue abnormality. IMPRESSION: 1. Small left frontal scalp contusion, decreased since 04/17/2015. No evidence of acute intracranial abnormality. No calvarial fracture. 2. No maxillofacial fracture. 3. No fracture or subluxation in the cervical spine. 4. Subacute to chronic moderate T3 vertebral compression fracture, stable. 5. Acute paranasal sinusitis, worsened. New partial opacification of the right mastoid air cells, nonspecific, favor a mild infectious or inflammatory right mastoiditis. 6. Diffuse cerebral atrophy, prominent chronic small vessel ischemic white matter change and chronic right basal ganglia lacunar infarct. Electronically Signed   By: Delbert Phenix M.D.   On: 04/25/2015 16:14   Ct Maxillofacial Wo Cm  04/17/2015  CLINICAL DATA:  Tripped and fall in bathroom, bruising and swelling between the eyes and along the left face with facial bruising. Dementia. EXAM: CT HEAD WITHOUT CONTRAST CT MAXILLOFACIAL WITHOUT CONTRAST CT CERVICAL SPINE WITHOUT CONTRAST TECHNIQUE: Multidetector CT imaging of the head, cervical spine, and maxillofacial structures were performed using the standard protocol without intravenous contrast. Multiplanar CT image reconstructions of the cervical spine and maxillofacial structures were also generated. COMPARISON:  08/10/2013 FINDINGS: CT HEAD FINDINGS Remote right lentiform nucleus lacunar infarct. Periventricular white matter and corona radiata hypodensities favor chronic ischemic microvascular white matter disease. No intracranial hemorrhage, mass lesion, or acute CVA. Left forehead scalp hematoma. Chronic ethmoid, maxillary, left  sphenoid, and frontal sinusitis. There is atherosclerotic calcification of the cavernous carotid arteries bilaterally. CT MAXILLOFACIAL FINDINGS Left forehead scalp hematoma extending from the midline laterally. No underlying fracture. Chronic ethmoid, frontal, sphenoid, and maxillary sinusitis. The mastoid air cells and middle ears appear clear. No acute facial fracture identified. Degenerative findings of the left mandibular condyle. No significant intraorbital abnormality. Left periorbital soft tissue swelling with abnormal soft tissue swelling overlying the left maxilla and face. Atherosclerosis noted. CT CERVICAL SPINE FINDINGS Calcification in the transverse ligament of C1. 40% anterior wedge compression fracture of T3 with 3 mm posterior bony retropulsion and sclerosis along the superior margin of the fracture. This fracture is thought to be at least 6-month-old. Multilevel uncinate and facet spurring but without osseous foraminal impingement. No malalignment or acute fracture identified. Mild biapical pleural parenchymal scarring. IMPRESSION: 1. Left forehead, periorbital, and left facial soft tissue swelling without underlying bony or orbital abnormality.  2. No acute intracranial findings. 3. Periventricular white matter and corona radiata hypodensities favor chronic ischemic microvascular white matter disease. 4. Remote right lentiform nucleus lacunar infarct. 5. Chronic paranasal sinusitis. 6. 40% anterior wedge compression fracture of the T3 vertebral body with 3 mm posterior bony retropulsion -this fractures thought to be at least 40-month-old but probably less than a year old. 7. Multilevel spondylosis in the cervical spine, without cervical spine fracture or subluxation. No bony foraminal impingement. Electronically Signed   By: Gaylyn Rong M.D.   On: 04/17/2015 13:39    Microbiology: Recent Results (from the past 240 hour(s))  Culture, blood (Routine X 2) w Reflex to ID Panel     Status:  None (Preliminary result)   Collection Time: 04/25/15  5:30 PM  Result Value Ref Range Status   Specimen Description BLOOD LEFT AC  Final   Special Requests BOTTLES DRAWN AEROBIC AND ANAEROBIC EACH  Final   Culture   Final    NO GROWTH 3 DAYS Performed at [facility]    Report Status PENDING  Incomplete  Culture, blood (Routine X 2) w Reflex to ID Panel     Status: None (Preliminary result)   Collection Time: 04/25/15  5:40 PM  Result Value Ref Range Status   Specimen Description BLOOD LEFT HAND  Final   Special Requests BOTTLES DRAWN AEROBIC AND ANAEROBIC EACH  Final   Culture   Final    NO GROWTH 3 DAYS Performed at [facility]    Report Status PENDING  Incomplete  MRSA PCR Screening     Status: None   Collection Time: 04/26/15  5:17 AM  Result Value Ref Range Status   MRSA by PCR NEGATIVE NEGATIVE Final    Comment:        The GeneXpert MRSA Assay (FDA approved for NASAL specimens only), is one component of a comprehensive MRSA colonization surveillance program. It is not intended to diagnose MRSA infection nor to guide or monitor treatment for MRSA infections.   Culture, sputum-assessment     Status: None   Collection Time: 04/27/15  7:05 AM  Result Value Ref Range Status   Specimen Description SPUTUM  Final   Special Requests NONE  Final   Sputum evaluation   Final    MICROSCOPIC FINDINGS SUGGEST THAT THIS SPECIMEN IS NOT REPRESENTATIVE OF LOWER RESPIRATORY SECRETIONS. PLEASE RECOLLECT. Gram Stain Report Called to,Read Back By and Verified With: Marylee Floras AT 0831 ON [id number] BY Lucienne Capers    Report Status 04/27/2015 FINAL  Final     Labs: Basic Metabolic Panel:  Recent Labs Lab 04/25/15 1430 04/27/15 0514 04/28/15 0550 04/30/15 0523  NA 142 142 140 138  K 3.2* 3.5 3.9 4.5  CL 108 108 103 101  CO2 28 27 30 28   GLUCOSE 149* 94 125* 90  BUN 28* 13 14 15   CREATININE 0.64 0.49 0.66 0.59  CALCIUM 8.8* 8.5* 8.9 8.8*   Liver  Function Tests:  Recent Labs Lab 04/25/15 1430  AST 112*  ALT 143*  ALKPHOS 73  BILITOT 0.6  PROT 6.3*  ALBUMIN 2.7*    Recent Labs Lab 04/25/15 1430  LIPASE 53*   No results for input(s): AMMONIA in the last 168 hours. CBC:  Recent Labs Lab 04/25/15 1430 04/27/15 0514 04/28/15 0550 04/30/15 0523  WBC 9.5 6.6 7.7 7.9  NEUTROABS 8.3*  --   --   --   HGB 11.8* 11.0* 12.3 12.1  HCT 37.0  33.6* 37.2 37.5  MCV 103.4* 102.1* 102.2* 102.5*  PLT 230 241 291 342   Cardiac Enzymes:  Recent Labs Lab 04/25/15 1430  TROPONINI <0.03   BNP: BNP (last 3 results)  Recent Labs  07/19/14 1818  BNP 191.0*    ProBNP (last 3 results) No results for input(s): PROBNP in the last 8760 hours.  CBG:  Recent Labs Lab 04/25/15 1451  GLUCAP 140*       SignedZannie Cove MD.  [facility] 04/30/2015, 11:08 AM

## 2015-04-30 NOTE — Progress Notes (Signed)
Speech Language Pathology Treatment: Dysphagia  Patient Details Name: Jacqueline [name] MRN: [id number] DOB: 1928/08/29 Today's Date: 04/30/2015 Time: 1120-1130 SLP Time Calculation (min) (ACUTE ONLY): 10 min  Assessment / Plan / Recommendation Clinical Impression  Pt voices her distaste for thickened liquids, but consumed 4 ounces of nectar-thick apple juice without further complaints. No overt signs of aspiration noted today, although frequent eructation was observed. Will continue to follow.   HPI HPI: Jacqueline [name] is a 82 y.o. female with PMH of dementia, coronary artery disease status post CABG 5, remote paroxysmal A. fib followed by cardiology on no anticoagulation, hypertension, hyperlipidemia, GERD, osteoporosis, diverticulitis, compression fracture T12, who presents with a generalized weakness and concern for PNA.      SLP Plan  Continue with current plan of care     Recommendations  Diet recommendations: Dysphagia 2 (fine chop);Nectar-thick liquid Liquids provided via: Cup;Straw Medication Administration: Whole meds with puree Supervision: Patient able to self feed;Full supervision/cueing for compensatory strategies Compensations: Minimize environmental distractions;Slow rate;Small sips/bites;Follow solids with liquid Postural Changes and/or Swallow Maneuvers: Seated upright 90 degrees;Upright 30-60 min after meal             Oral Care Recommendations: Oral care BID Follow up Recommendations: Skilled Nursing facility;24 hour supervision/assistance Plan: Continue with current plan of care     GO               Maxcine Ham, M.A. CCC-SLP [phone]  Maxcine Ham 04/30/2015, 12:12 PM

## 2015-04-30 NOTE — NC FL2 (Signed)
[place] MEDICAID FL2 LEVEL OF CARE SCREENING TOOL     IDENTIFICATION  Patient Name: Jacqueline [name] Birthdate: 06/15/1928 Sex: female Admission Date (Current Location): 04/25/2015  [facility] and [place] Number:  Producer, television/film/video and Address:  [facility], [address], [place], [place] [address]      Provider Number: [id number]  Attending Physician Name and Address:  Zannie Cove, MD  Relative Name and Phone Number:       Current Level of Care: Hospital Recommended Level of Care: Assisted Living Facility Prior Approval Number:    Date Approved/Denied:   PASRR Number:    Discharge Plan:  (ALF  [facility])    Current Diagnoses: Patient Active Problem List   Diagnosis Date Noted  . Community acquired pneumonia 04/26/2015  . Right upper lobe pneumonia 04/25/2015  . Fall 04/23/2015  . Generalized anxiety disorder 01/22/2015  . Anorexia 01/17/2015  . UTI (lower urinary tract infection) 01/16/2015  . Generalized weakness 01/16/2015  . Compression fracture of T12 vertebra (HCC)   . Hyponatremia 10/20/2014  . Pressure ulcer 10/20/2014  . Depression 10/18/2014  . Severe muscle deconditioning 10/18/2014  . Right knee pain 08/20/2014  . Hyperlipidemia 02/05/2014  . Physical exam 02/05/2014  . 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 Allergic rhinitis 10/05/2013  . Loss of weight 10/05/2013  . Protein calorie malnutrition (HCC) 10/05/2013  . SOB (shortness of breath) 08/17/2013  . CAD (coronary artery disease) 01/02/2013  . Atrial fibrillation (HCC) 01/02/2013  . Aortic stenosis 01/02/2013  . Confusion 11/21/2012  . Pancreatic lesion 11/21/2012  . Constipation, slow transit 11/21/2012  . Cerumen impaction 09/27/2012  . Chest heaviness 08/03/2012  . Abnormal urine odor 08/03/2012  . Insomnia 07/04/2012  . Hearing loss of aging 07/04/2012  . Age-related bone loss 04/28/2012  . HTN (hypertension) 04/28/2012  .  Reflux 04/28/2012    Orientation RESPIRATION BLADDER Height & Weight    Time, Self, Situation  Normal     113 lbs.  BEHAVIORAL SYMPTOMS/MOOD NEUROLOGICAL BOWEL NUTRITION STATUS        Diet (DYS II   Nectar Thick   also feeding supplement (ENSURE ENLIVE) (ENSURE ENLIVE) liquid 237 mL )  AMBULATORY STATUS COMMUNICATION OF NEEDS Skin   Supervision Verbally Normal                       Personal Care Assistance Level of Assistance              Functional Limitations Info             SPECIAL CARE FACTORS FREQUENCY                       Contractures Contractures Info: Not present    Additional Factors Info  Code Status, Allergies Code Status Info: DNR Allergies Info: NKDA           Current Medications (04/30/2015):  This is the current hospital active medication list Current Facility-Administered Medications  Medication Dose Route Frequency Provider Last Rate Last Dose  . acetaminophen (TYLENOL) tablet 650 mg  650 mg Oral Q6H PRN Zannie Cove, MD   650 mg at 04/27/15 1447  . amiodarone (PACERONE) tablet 200 mg  200 mg Oral Daily Hillary Bow, DO   200 mg at 04/30/15 0950  . amLODipine (NORVASC) tablet 5 mg  5 mg Oral Daily Hillary Bow, DO   5 mg at 04/30/15  1610  . aspirin EC tablet 325 mg  325 mg Oral QPM Hillary Bow, DO   325 mg at 04/29/15 1834  . atorvastatin (LIPITOR) tablet 40 mg  40 mg Oral q1800 Hillary Bow, DO   40 mg at 04/29/15 1834  . enoxaparin (LOVENOX) injection 40 mg  40 mg Subcutaneous Q24H Hillary Bow, DO   40 mg at 04/30/15 0951  . feeding supplement (ENSURE ENLIVE) (ENSURE ENLIVE) liquid 237 mL  237 mL Oral TID BM Hillary Bow, DO   237 mL at 04/30/15 0951  . fluticasone (FLONASE) 50 MCG/ACT nasal spray 2 spray  2 spray Each Nare Daily Hillary Bow, DO   2 spray at 04/30/15 0951  . folic acid (FOLVITE) tablet 1 mg  1 mg Oral Daily Hillary Bow, DO   1 mg at 04/30/15 0950  . guaiFENesin (MUCINEX) 12 hr tablet  600 mg  600 mg Oral BID Zannie Cove, MD   600 mg at 04/30/15 0951  . HYDROcodone-acetaminophen (NORCO/VICODIN) 5-325 MG per tablet 1 tablet  1 tablet Oral Q6H PRN Zannie Cove, MD   1 tablet at 04/29/15 2219  . latanoprost (XALATAN) 0.005 % ophthalmic solution 1 drop  1 drop Both Eyes QHS Rolan Lipa, NP   1 drop at 04/29/15 2216  . levofloxacin (LEVAQUIN) tablet 500 mg  500 mg Oral Daily Zannie Cove, MD   500 mg at 04/30/15 0951  . lisinopril (PRINIVIL,ZESTRIL) tablet 40 mg  40 mg Oral Daily Hillary Bow, DO   40 mg at 04/30/15 0951  . memantine (NAMENDA) tablet 10 mg  10 mg Oral BID Hillary Bow, DO   10 mg at 04/30/15 0951  . menthol-cetylpyridinium (CEPACOL) lozenge 3 mg  1 lozenge Oral Once Zannie Cove, MD      . pantoprazole (PROTONIX) EC tablet 40 mg  40 mg Oral Daily Hillary Bow, DO   40 mg at 04/30/15 0950  . polyethylene glycol (MIRALAX / GLYCOLAX) packet 17 g  17 g Oral BID Zannie Cove, MD   17 g at 04/30/15 0951  . RESOURCE THICKENUP CLEAR   Oral PRN Zannie Cove, MD      . senna-docusate (Senokot-S) tablet 1 tablet  1 tablet Oral BID Zannie Cove, MD   1 tablet at 04/30/15 9604     Discharge Medications: Please see discharge summary for a list of discharge medications.  Relevant Imaging Results:  Relevant Lab Results:   Additional Information SSN#   [id number]  [name]

## 2015-04-30 NOTE — Progress Notes (Signed)
CSW working on discharge for Pt to SNF.   CSW spoke with family, assessment to follow. Pt's family is on their way back to [facility] and CSW left another message with family to see if there is someone else who can sign Pt into facility. [facility] are considering if paperwork can be completed.   Jacqueline [name]  [phone]

## 2015-04-30 NOTE — Progress Notes (Signed)
Report called to [place] at [facility].

## 2015-04-30 NOTE — [place] FL2 (Signed)
[place] MEDICAID FL2 LEVEL OF CARE SCREENING TOOL     IDENTIFICATION  Patient Name: Jacqueline [name] Birthdate: 11-21-1928 Sex: female Admission Date (Current Location): 04/25/2015  [facility] and [place] Number:  Producer, television/film/video and Address:  [facility], [address], [place], [place] [address]      Provider Number: [id number]  Attending Physician Name and Address:  Zannie Cove, MD  Relative Name and Phone Number:       Current Level of Care: Hospital Recommended Level of Care: Skilled Nursing Facility Prior Approval Number:    Date Approved/Denied:   PASRR Number:    Discharge Plan:  (ALF  [facility])    Current Diagnoses: Patient Active Problem List   Diagnosis Date Noted  . Community acquired pneumonia 04/26/2015  . Right upper lobe pneumonia 04/25/2015  . Fall 04/23/2015  . Generalized anxiety disorder 01/22/2015  . Anorexia 01/17/2015  . UTI (lower urinary tract infection) 01/16/2015  . Generalized weakness 01/16/2015  . Compression fracture of T12 vertebra (HCC)   . Hyponatremia 10/20/2014  . Pressure ulcer 10/20/2014  . Depression 10/18/2014  . Severe muscle deconditioning 10/18/2014  . Right knee pain 08/20/2014  . Hyperlipidemia 02/05/2014  . Physical exam 02/05/2014  . 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 Allergic rhinitis 10/05/2013  . Loss of weight 10/05/2013  . Protein calorie malnutrition (HCC) 10/05/2013  . SOB (shortness of breath) 08/17/2013  . CAD (coronary artery disease) 01/02/2013  . Atrial fibrillation (HCC) 01/02/2013  . Aortic stenosis 01/02/2013  . Confusion 11/21/2012  . Pancreatic lesion 11/21/2012  . Constipation, slow transit 11/21/2012  . Cerumen impaction 09/27/2012  . Chest heaviness 08/03/2012  . Abnormal urine odor 08/03/2012  . Insomnia 07/04/2012  . Hearing loss of aging 07/04/2012  . Age-related bone loss 04/28/2012  . HTN (hypertension) 04/28/2012  .  Reflux 04/28/2012    Orientation RESPIRATION BLADDER Height & Weight    Time, Self, Situation  Normal     113 lbs.  BEHAVIORAL SYMPTOMS/MOOD NEUROLOGICAL BOWEL NUTRITION STATUS        Diet (DYS II   Nectar Thick   also feeding supplement (ENSURE ENLIVE) (ENSURE ENLIVE) liquid 237 mL )  AMBULATORY STATUS COMMUNICATION OF NEEDS Skin   Supervision Verbally Normal                       Personal Care Assistance Level of Assistance              Functional Limitations Info             SPECIAL CARE FACTORS FREQUENCY                       Contractures Contractures Info: Not present    Additional Factors Info  Code Status, Allergies Code Status Info: DNR Allergies Info: NKDA           Current Medications (04/30/2015):  This is the current hospital active medication list Current Facility-Administered Medications  Medication Dose Route Frequency Provider Last Rate Last Dose  . acetaminophen (TYLENOL) tablet 650 mg  650 mg Oral Q6H PRN Zannie Cove, MD   650 mg at 04/27/15 1447  . amiodarone (PACERONE) tablet 200 mg  200 mg Oral Daily Hillary Bow, DO   200 mg at 04/30/15 0950  . amLODipine (NORVASC) tablet 5 mg  5 mg Oral Daily Hillary Bow, DO   5 mg at 04/30/15  1610  . aspirin EC tablet 325 mg  325 mg Oral QPM Hillary Bow, DO   325 mg at 04/29/15 1834  . atorvastatin (LIPITOR) tablet 40 mg  40 mg Oral q1800 Hillary Bow, DO   40 mg at 04/29/15 1834  . enoxaparin (LOVENOX) injection 40 mg  40 mg Subcutaneous Q24H Hillary Bow, DO   40 mg at 04/30/15 0951  . feeding supplement (ENSURE ENLIVE) (ENSURE ENLIVE) liquid 237 mL  237 mL Oral TID BM Hillary Bow, DO   237 mL at 04/30/15 0951  . fluticasone (FLONASE) 50 MCG/ACT nasal spray 2 spray  2 spray Each Nare Daily Hillary Bow, DO   2 spray at 04/30/15 0951  . folic acid (FOLVITE) tablet 1 mg  1 mg Oral Daily Hillary Bow, DO   1 mg at 04/30/15 0950  . guaiFENesin (MUCINEX) 12 hr tablet  600 mg  600 mg Oral BID Zannie Cove, MD   600 mg at 04/30/15 0951  . HYDROcodone-acetaminophen (NORCO/VICODIN) 5-325 MG per tablet 1 tablet  1 tablet Oral Q6H PRN Zannie Cove, MD   1 tablet at 04/29/15 2219  . latanoprost (XALATAN) 0.005 % ophthalmic solution 1 drop  1 drop Both Eyes QHS Rolan Lipa, NP   1 drop at 04/29/15 2216  . levofloxacin (LEVAQUIN) tablet 500 mg  500 mg Oral Daily Zannie Cove, MD   500 mg at 04/30/15 0951  . lisinopril (PRINIVIL,ZESTRIL) tablet 40 mg  40 mg Oral Daily Hillary Bow, DO   40 mg at 04/30/15 0951  . memantine (NAMENDA) tablet 10 mg  10 mg Oral BID Hillary Bow, DO   10 mg at 04/30/15 0951  . menthol-cetylpyridinium (CEPACOL) lozenge 3 mg  1 lozenge Oral Once Zannie Cove, MD      . pantoprazole (PROTONIX) EC tablet 40 mg  40 mg Oral Daily Hillary Bow, DO   40 mg at 04/30/15 0950  . polyethylene glycol (MIRALAX / GLYCOLAX) packet 17 g  17 g Oral BID Zannie Cove, MD   17 g at 04/30/15 0951  . RESOURCE THICKENUP CLEAR   Oral PRN Zannie Cove, MD      . senna-docusate (Senokot-S) tablet 1 tablet  1 tablet Oral BID Zannie Cove, MD   1 tablet at 04/30/15 9604     Discharge Medications: Please see discharge summary for a list of discharge medications.  Relevant Imaging Results:  Relevant Lab Results:   Additional Information SSN#   [id number]  [name]

## 2015-05-01 ENCOUNTER — Non-Acute Institutional Stay (SKILLED_NURSING_FACILITY): Payer: Commercial Managed Care - HMO | Admitting: Internal Medicine

## 2015-05-01 ENCOUNTER — Encounter: Payer: Self-pay | Admitting: Internal Medicine

## 2015-05-01 DIAGNOSIS — I480 Paroxysmal atrial fibrillation: Secondary | ICD-10-CM

## 2015-05-01 DIAGNOSIS — F0391 Unspecified dementia with behavioral disturbance: Secondary | ICD-10-CM | POA: Diagnosis not present

## 2015-05-01 DIAGNOSIS — F0390 Unspecified dementia without behavioral disturbance: Secondary | ICD-10-CM

## 2015-05-01 DIAGNOSIS — R100 Acute abdomen: Secondary | ICD-10-CM

## 2015-05-01 DIAGNOSIS — K5900 Constipation, unspecified: Secondary | ICD-10-CM | POA: Diagnosis not present

## 2015-05-01 DIAGNOSIS — R109 Unspecified abdominal pain: Secondary | ICD-10-CM

## 2015-05-01 DIAGNOSIS — E785 Hyperlipidemia, unspecified: Secondary | ICD-10-CM | POA: Diagnosis not present

## 2015-05-01 DIAGNOSIS — R531 Weakness: Secondary | ICD-10-CM

## 2015-05-01 DIAGNOSIS — F411 Generalized anxiety disorder: Secondary | ICD-10-CM

## 2015-05-01 DIAGNOSIS — F0150 Vascular dementia without behavioral disturbance: Secondary | ICD-10-CM

## 2015-05-01 DIAGNOSIS — J189 Pneumonia, unspecified organism: Secondary | ICD-10-CM | POA: Diagnosis not present

## 2015-05-01 DIAGNOSIS — E46 Unspecified protein-calorie malnutrition: Secondary | ICD-10-CM | POA: Diagnosis not present

## 2015-05-01 DIAGNOSIS — M810 Age-related osteoporosis without current pathological fracture: Secondary | ICD-10-CM

## 2015-05-01 DIAGNOSIS — M8580 Other specified disorders of bone density and structure, unspecified site: Secondary | ICD-10-CM

## 2015-05-01 LAB — CULTURE, BLOOD (ROUTINE X 2)
CULTURE: NO GROWTH
Culture: NO GROWTH

## 2015-05-01 NOTE — Progress Notes (Signed)
Patient ID: Jacqueline [name], female   DOB: [birth year], 82 y.o.   MRN: [id number]       PCP: [name], MD  Code Status: DNR  No Known Allergies  Chief Complaint  Patient presents with  . New Admit To SNF    New admission to facility     HPI:  82 y.o. patient is here for short term rehabilitation post hospital admission from 04/25/15-04/30/15 with right upper lobe pneumonia and dysphagia. She was started on antibiotics. She has PMH of HTN, dementia, afib, constipation among others. She is seen in her room today. She has been feeling weak and has abdominal pain.   Review of Systems:  Constitutional: positive for malaise. Negative for fever, chills.  HENT: Negative for headache, congestion, nasal discharge.   Eyes: Negative for double vision and discharge.  Respiratory: Negative for cough, shortness of breath and wheezing.   Cardiovascular: Negative for chest pain, palpitations, leg swelling.  Gastrointestinal: Negative for heartburn, nausea, vomiting. Had bowel movement this am- regular soft stool without blood per therapy team Genitourinary: Negative for dysuria and flank pain.  Musculoskeletal: Negative for falls in the facility but is a high fall risk Skin: Negative for rash.  Neurological: Negative for dizziness Psychiatric/Behavioral: positive for memory loss.    Past Medical History  Diagnosis Date  . Diverticulitis   . Hypertension   . Osteoporosis   . CAD (coronary artery disease)   . Dementia   . PAF (paroxysmal atrial fibrillation) (HCC)   . HLD (hyperlipidemia)   . GERD (gastroesophageal reflux disease)   . Community acquired pneumonia   . Dyslipidemia   . Constipation   . Anorexia   . UTI (lower urinary tract infection)   . Generalized weakness   . Compression fracture of T12 vertebra (HCC)   . Hyponatremia   . Pressure ulcer   . Depression   . Severe muscle deconditioning   . Right knee pain   . Allergic rhinitis   . Protein calorie  malnutrition (HCC)   . SOB (shortness of breath)   . Cerumen impaction   . Insomnia   . Hearing loss of aging    Past Surgical History  Procedure Laterality Date  . Fracture surgery    . Partial colectomy    . Coronary artery bypass graft N/A 08/25/2012    Procedure: CORONARY ARTERY BYPASS GRAFTING ;  Surgeon: Alleen Borne, MD;  Location: [facility] OR;  Service: Open Heart Surgery;  Laterality: N/A;  four bypasses total   . Left heart catheterization with coronary angiogram N/A 08/22/2012    Procedure: LEFT HEART CATHETERIZATION WITH CORONARY ANGIOGRAM;  Surgeon: Peter M [place], MD;  Location: [facility] CATH LAB;  Service: Cardiovascular;  Laterality: N/A;   Social History:   reports that she has quit smoking. Her smoking use included Cigarettes. She has never used smokeless tobacco. She reports that she does not drink alcohol or use illicit drugs.  Family History  Problem Relation Age of Onset  . Hypertension Other   . Cancer Mother     stomach  . Diabetes Neg Hx   . Heart disease Neg Hx   . Stroke Neg Hx     Medications:   Medication List       This list is accurate as of: 05/01/15  2:10 PM.  Always use your most recent med list.               acetaminophen 325 MG tablet  Commonly known as:  TYLENOL  Take 2 tablets (650 mg total) by mouth every 6 (six) hours as needed for mild pain or headache.     alendronate 70 MG tablet  Commonly known as:  FOSAMAX  TAKE 1 TABLET BY MOUTH EVERY 7 DAYS. TAKE WITH A FULL GLASS OF WATER ON AN EMPTY STOMACH     amiodarone 200 MG tablet  Commonly known as:  PACERONE  TAKE 1 TABLET BY MOUTH DAILY.     amLODipine 5 MG tablet  Commonly known as:  NORVASC  Take 1 tablet (5 mg total) by mouth daily.     aspirin EC 325 MG tablet  Take 325 mg by mouth every evening.     atorvastatin 40 MG tablet  Commonly known as:  LIPITOR  TAKE 1 TABLET BY MOUTH DAILY AT 6 PM     BESIVANCE 0.6 % Susp  Generic drug:  Besifloxacin HCl  PLACE 1 DROP IN RIGHT  EYE ONE TIME  A DAY     diazepam 2 MG tablet  Commonly known as:  VALIUM  1/2 tab daily and then use other 1/2 as needed for high anxiety moments     DUREZOL 0.05 % Emul  Generic drug:  Difluprednate  Apply 1 drop to eye 3 (three) times daily. One drop in left eye three times daily     feeding supplement (ENSURE ENLIVE) Liqd  Take 237 mLs by mouth 3 (three) times daily between meals.     folic acid 1 MG tablet  Commonly known as:  FOLVITE  TAKE 1 TABLET BY MOUTH EVERY DAY     ILEVRO 0.3 % ophthalmic suspension  Generic drug:  nepafenac  INSTILL 1 DROP IN RIGHT EYE AT AFTERNOON     levofloxacin 500 MG tablet  Commonly known as:  LEVAQUIN  Take 1 tablet (500 mg total) by mouth daily. For 3days     lisinopril 40 MG tablet  Commonly known as:  PRINIVIL,ZESTRIL  Take 1 tablet (40 mg total) by mouth daily.     LUMIGAN 0.01 % Soln  Generic drug:  bimatoprost  PLACE 1 DROP INTO BOTH EYES AT BEDTIME.     memantine 10 MG tablet  Commonly known as:  NAMENDA  TAKE 1 TABLET (10 MG TOTAL) BY MOUTH 2 (TWO) TIMES DAILY.     metoprolol tartrate 25 MG tablet  Commonly known as:  LOPRESSOR  Take 1 tablet by mouth 2 (two) times daily.     polyethylene glycol packet  Commonly known as:  MIRALAX / GLYCOLAX  Take 17 g by mouth daily as needed.     senna-docusate 8.6-50 MG tablet  Commonly known as:  Senokot-S  Take 1 tablet by mouth 2 (two) times daily.         Physical Exam: Filed Vitals:   05/01/15 1127  BP: 126/76  Pulse: 80  Temp: 98 F (36.7 C)  TempSrc: Oral  Resp: 20  Height:  (1.676 m)  Weight: 114 lb 6.2 oz (51.886 kg)  SpO2: 96%    General- elderly female, thin built, in no acute distress Head- normocephalic, atraumatic Nose- no maxillary or frontal sinus tenderness, no nasal discharge Throat- moist mucus membrane Eyes- no pallor, no icterus, no discharge, normal conjunctiva, normal sclera Neck- no cervical lymphadenopathy Cardiovascular- normal s1,s2,  no leg edema Respiratory- bilateral clear to auscultation, no wheeze, no rhonchi, no crackles, no use of accessory muscles Abdomen- bowel sounds present, soft, non tender Musculoskeletal- able to move all 4 extremities, generalized  weakness and unsteady gait, sacral wound present Neurological- alert and oriented to person Skin- bruise and ecchymoses to face and arms    Labs reviewed: Basic Metabolic Panel:  Recent Labs  16/10/96 1138  04/27/15 0514 04/28/15 0550 04/30/15 0523  NA 136  < > 142 140 138  K 4.0  < > 3.5 3.9 4.5  CL 99  < > 108 103 101  CO2 31  < > 27 30 28   GLUCOSE 75  < > 94 125* 90  BUN 15  < > 13 14 15   CREATININE 0.66  < > 0.49 0.66 0.59  CALCIUM 9.5  < > 8.5* 8.9 8.8*  MG 1.9  --   --   --   --   < > = values in this interval not displayed. Liver Function Tests:  Recent Labs  01/15/15 2106 01/22/15 1138 04/25/15 1430  AST 38 28 112*  ALT 32 28 143*  ALKPHOS 73 77 73  BILITOT 1.0 0.4 0.6  PROT 6.3* 6.6 6.3*  ALBUMIN 3.3* 3.4* 2.7*    Recent Labs  01/12/15 0750 01/16/15 0245 04/25/15 1430  LIPASE 38 35 53*   No results for input(s): AMMONIA in the last 8760 hours. CBC:  Recent Labs  01/22/15 1138 04/17/15 1230  04/25/15 1430 04/27/15 0514 04/28/15 0550 04/30/15 0523  WBC 10.7* 8.4  --  9.5 6.6 7.7 7.9  NEUTROABS 9.2* 7.3  --  8.3*  --   --   --   HGB 12.6 11.8*  < > 11.8* 11.0* 12.3 12.1  HCT 38.1 36.8  < > 37.0 33.6* 37.2 37.5  MCV 102.7* 103.1*  --  103.4* 102.1* 102.2* 102.5*  PLT 288.0 188  --  230 241 291 342  < > = values in this interval not displayed. Cardiac Enzymes:  Recent Labs  01/16/15 0947 01/16/15 1540 04/25/15 1430  TROPONINI <0.03 <0.03 <0.03   BNP: Invalid input(s): POCBNP CBG:  Recent Labs  04/25/15 1451  GLUCAP 140*    Radiological Exams: Ct Head Wo Contrast  04/25/2015  CLINICAL DATA:  Fall.  Altered mental status.  Bruises. EXAM: CT HEAD WITHOUT CONTRAST CT MAXILLOFACIAL WITHOUT CONTRAST CT  CERVICAL SPINE WITHOUT CONTRAST TECHNIQUE: Multidetector CT imaging of the head, cervical spine, and maxillofacial structures were performed using the standard protocol without intravenous contrast. Multiplanar CT image reconstructions of the cervical spine and maxillofacial structures were also generated. COMPARISON:  04/17/2015 head, cervical spine and maxillofacial CT. FINDINGS: CT HEAD FINDINGS Left frontal scalp contusion is decreased since 04/17/2015. No evidence of parenchymal hemorrhage or extra-axial fluid collection. No mass lesion, mass effect, or midline shift. No CT evidence of acute infarction. Intracranial atherosclerosis. Diffuse cerebral volume loss. Nonspecific prominent stable subcortical and periventricular white matter hypodensity, most in keeping with chronic small vessel ischemic change. Stable small right lentiform nucleus lacunar infarct. No ventriculomegaly. There are new small air-fluid levels in the bilateral maxillary sinuses, with associated mucosal thickening in the bilateral maxillary sinuses. Partial opacification of the bilateral ethmoidal air cells. Stable tiny mucous retention cyst versus polyp in the posterior left frontal sinus. Mild mucosal thickening and inspissated secretions in the sphenoid sinus. New partial opacification of the right mastoid air cells. Left mastoid air cells are unopacified. No evidence of calvarial fracture. CT MAXILLOFACIAL FINDINGS Re- demonstration of small left frontal scalp contusion. No maxillofacial fracture. The maxilla and mandible appear intact. The nasal bones are unremarkable in appearance. There is stable levocurvature of the intact appearing nasal  septum. No dislocation at the temporomandibular joints. 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 The visualized dentition demonstrates no acute abnormality. The orbits are intact bilaterally. Re- demonstration of a new small fluid levels in the bilateral maxillary sinuses with  associated mild mucosal thickening in the bilateral maxillary sinuses. Partial opacification of the bilateral ethmoidal air cells. Stable tiny mucous retention cyst versus polyp in the posterior left frontal sinus. Mild mucosal thickening with mild inspissated secretions in the sphenoid sinus. No partial opacification of right mastoid air cells. Unopacified left mastoid air cells. No aggressive appearing focal osseous lesions. The parapharyngeal fat planes are preserved. The nasopharynx, oropharynx and hypopharynx are unremarkable in appearance. The parotid and submandibular glands are within normal limits. No cervical lymphadenopathy is seen. CT CERVICAL SPINE FINDINGS No fracture is detected in the cervical spine. Stable subacute to chronic moderate T3 vertebral compression fracture with approximately 40% loss of vertebral body height, unchanged since 04/17/2015. No prevertebral soft tissue swelling. Normal cervical lordosis. Dens is well positioned between the lateral masses of C1. The lateral masses appear well-aligned. Moderate degenerative disc disease throughout the cervical spine, most prominent at C6-7. Moderate facet arthropathy in the cervical spine, asymmetric to the left. No significant cervical foraminal stenosis. No cervical spine subluxation. New partial opacification of the right mastoid air cells. Visualized left mastoid air cells appear unopacified. No evidence of intra-axial hemorrhage in the visualized brain. No gross cervical canal hematoma. Apical right upper lobe 4 mm pulmonary nodule (series 12/ image 73), stable since 04/17/2015. No cervical adenopathy or other significant neck soft tissue abnormality. IMPRESSION: 1. Small left frontal scalp contusion, decreased since 04/17/2015. No evidence of acute intracranial abnormality. No calvarial fracture. 2. No maxillofacial fracture. 3. No fracture or subluxation in the cervical spine. 4. Subacute to chronic moderate T3 vertebral compression  fracture, stable. 5. Acute paranasal sinusitis, worsened. New partial opacification of the right mastoid air cells, nonspecific, favor a mild infectious or inflammatory right mastoiditis. 6. Diffuse cerebral atrophy, prominent chronic small vessel ischemic white matter change and chronic right basal ganglia lacunar infarct. Electronically Signed   By: Delbert Phenix M.D.   On: 04/25/2015 16:14   Ct Chest W Contrast  04/25/2015  CLINICAL DATA:  Status post fall last night. Pain and bruising. Initial encounter. EXAM: CT CHEST, ABDOMEN, AND PELVIS WITH CONTRAST TECHNIQUE: Multidetector CT imaging of the chest, abdomen and pelvis was performed following the standard protocol during bolus administration of intravenous contrast. CONTRAST:  100 ML OMNIPAQUE IOHEXOL 300 MG/ML  SOLN COMPARISON:  CT abdomen and pelvis 01/12/2015. PA and lateral chest 01/12/2015. MRI abdomen 12/21/2014. FINDINGS: CT CHEST FINDINGS Mediastinum/Lymph Nodes: No evidence of posttraumatic change is identified. There is cardiomegaly. Calcific aortic and coronary atherosclerosis is noted. The patient is status post CABG. Lungs/Pleura: Trace bilateral pleural effusions are seen. There is an area of ground-glass attenuation in the anterior right upper lobe. Dependent atelectasis is noted. Musculoskeletal: Remote T3 and T12 compression fractures are identified. Vertebral body height loss of T6 has progressed since the prior PA and lateral chest films. No acute fracture is identified. CT ABDOMEN PELVIS FINDINGS Hepatobiliary: A single small gallstone is identified. No evidence of cholecystitis is seen. No biliary ductal dilatation. Pancreas: Multilocular cystic lesion in the tail the pancreas is unchanged. Spleen: Unremarkable. Adrenals/Urinary Tract: The adrenal glands appear normal. Renal cysts are unchanged. Stomach/Bowel: Surgical anastomosis in the sigmoid colon is noted. The colon is otherwise unremarkable. Stomach and small bowel appear normal.  Vascular/Lymphatic: IVC filter is  noted. The patient has extensive aortoiliac atherosclerosis without aneurysm. Reproductive: Status post hysterectomy. Other: None. Musculoskeletal: Right hip replacement is seen. Remote pubic ramus fractures are present bilaterally. Lower lumbar spondylosis is noted. IMPRESSION: Negative for trauma to the chest, abdomen or pelvis. Small area of ground-glass attenuation anterior right upper lobe may be due to infectious or inflammatory process. Cardiomegaly. Extensive aortic and coronary atherosclerosis. Single small gallstone without evidence of cholecystitis. No change in a small multilocular cystic lesion in the tail of the pancreas. Remote T3, T6 and T12 compression fractures. Vertebral body height loss of T6 has progressed since the prior examinations. Remote pubic ramus fractures are also noted. Electronically Signed   By: Drusilla Kanner M.D.   On: 04/25/2015 16:03   Ct Cervical Spine Wo Contrast  04/25/2015  CLINICAL DATA:  Fall.  Altered mental status.  Bruises. EXAM: CT HEAD WITHOUT CONTRAST CT MAXILLOFACIAL WITHOUT CONTRAST CT CERVICAL SPINE WITHOUT CONTRAST TECHNIQUE: Multidetector CT imaging of the head, cervical spine, and maxillofacial structures were performed using the standard protocol without intravenous contrast. Multiplanar CT image reconstructions of the cervical spine and maxillofacial structures were also generated. COMPARISON:  04/17/2015 head, cervical spine and maxillofacial CT. FINDINGS: CT HEAD FINDINGS Left frontal scalp contusion is decreased since 04/17/2015. No evidence of parenchymal hemorrhage or extra-axial fluid collection. No mass lesion, mass effect, or midline shift. No CT evidence of acute infarction. Intracranial atherosclerosis. Diffuse cerebral volume loss. Nonspecific prominent stable subcortical and periventricular white matter hypodensity, most in keeping with chronic small vessel ischemic change. Stable small right lentiform nucleus  lacunar infarct. No ventriculomegaly. There are new small air-fluid levels in the bilateral maxillary sinuses, with associated mucosal thickening in the bilateral maxillary sinuses. Partial opacification of the bilateral ethmoidal air cells. Stable tiny mucous retention cyst versus polyp in the posterior left frontal sinus. Mild mucosal thickening and inspissated secretions in the sphenoid sinus. New partial opacification of the right mastoid air cells. Left mastoid air cells are unopacified. No evidence of calvarial fracture. CT MAXILLOFACIAL FINDINGS Re- demonstration of small left frontal scalp contusion. No maxillofacial fracture. The maxilla and mandible appear intact. The nasal bones are unremarkable in appearance. There is stable levocurvature of the intact appearing nasal septum. No dislocation at the temporomandibular joints. 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 The visualized dentition demonstrates no acute abnormality. The orbits are intact bilaterally. Re- demonstration of a new small fluid levels in the bilateral maxillary sinuses with associated mild mucosal thickening in the bilateral maxillary sinuses. Partial opacification of the bilateral ethmoidal air cells. Stable tiny mucous retention cyst versus polyp in the posterior left frontal sinus. Mild mucosal thickening with mild inspissated secretions in the sphenoid sinus. No partial opacification of right mastoid air cells. Unopacified left mastoid air cells. No aggressive appearing focal osseous lesions. The parapharyngeal fat planes are preserved. The nasopharynx, oropharynx and hypopharynx are unremarkable in appearance. The parotid and submandibular glands are within normal limits. No cervical lymphadenopathy is seen. CT CERVICAL SPINE FINDINGS No fracture is detected in the cervical spine. Stable subacute to chronic moderate T3 vertebral compression fracture with approximately 40% loss of vertebral body height, unchanged  since 04/17/2015. No prevertebral soft tissue swelling. Normal cervical lordosis. Dens is well positioned between the lateral masses of C1. The lateral masses appear well-aligned. Moderate degenerative disc disease throughout the cervical spine, most prominent at C6-7. Moderate facet arthropathy in the cervical spine, asymmetric to the left. No significant cervical foraminal stenosis. No cervical spine subluxation. New partial opacification  of the right mastoid air cells. Visualized left mastoid air cells appear unopacified. No evidence of intra-axial hemorrhage in the visualized brain. No gross cervical canal hematoma. Apical right upper lobe 4 mm pulmonary nodule (series 12/ image 73), stable since 04/17/2015. No cervical adenopathy or other significant neck soft tissue abnormality. IMPRESSION: 1. Small left frontal scalp contusion, decreased since 04/17/2015. No evidence of acute intracranial abnormality. No calvarial fracture. 2. No maxillofacial fracture. 3. No fracture or subluxation in the cervical spine. 4. Subacute to chronic moderate T3 vertebral compression fracture, stable. 5. Acute paranasal sinusitis, worsened. New partial opacification of the right mastoid air cells, nonspecific, favor a mild infectious or inflammatory right mastoiditis. 6. Diffuse cerebral atrophy, prominent chronic small vessel ischemic white matter change and chronic right basal ganglia lacunar infarct. Electronically Signed   By: Delbert Phenix M.D.   On: 04/25/2015 16:14   Ct Abdomen Pelvis W Contrast  04/25/2015  CLINICAL DATA:  Status post fall last night. Pain and bruising. Initial encounter. EXAM: CT CHEST, ABDOMEN, AND PELVIS WITH CONTRAST TECHNIQUE: Multidetector CT imaging of the chest, abdomen and pelvis was performed following the standard protocol during bolus administration of intravenous contrast. CONTRAST:  100 ML OMNIPAQUE IOHEXOL 300 MG/ML  SOLN COMPARISON:  CT abdomen and pelvis 01/12/2015. PA and lateral chest  01/12/2015. MRI abdomen 12/21/2014. FINDINGS: CT CHEST FINDINGS Mediastinum/Lymph Nodes: No evidence of posttraumatic change is identified. There is cardiomegaly. Calcific aortic and coronary atherosclerosis is noted. The patient is status post CABG. Lungs/Pleura: Trace bilateral pleural effusions are seen. There is an area of ground-glass attenuation in the anterior right upper lobe. Dependent atelectasis is noted. Musculoskeletal: Remote T3 and T12 compression fractures are identified. Vertebral body height loss of T6 has progressed since the prior PA and lateral chest films. No acute fracture is identified. CT ABDOMEN PELVIS FINDINGS Hepatobiliary: A single small gallstone is identified. No evidence of cholecystitis is seen. No biliary ductal dilatation. Pancreas: Multilocular cystic lesion in the tail the pancreas is unchanged. Spleen: Unremarkable. Adrenals/Urinary Tract: The adrenal glands appear normal. Renal cysts are unchanged. Stomach/Bowel: Surgical anastomosis in the sigmoid colon is noted. The colon is otherwise unremarkable. Stomach and small bowel appear normal. Vascular/Lymphatic: IVC filter is noted. The patient has extensive aortoiliac atherosclerosis without aneurysm. Reproductive: Status post hysterectomy. Other: None. Musculoskeletal: Right hip replacement is seen. Remote pubic ramus fractures are present bilaterally. Lower lumbar spondylosis is noted. IMPRESSION: Negative for trauma to the chest, abdomen or pelvis. Small area of ground-glass attenuation anterior right upper lobe may be due to infectious or inflammatory process. Cardiomegaly. Extensive aortic and coronary atherosclerosis. Single small gallstone without evidence of cholecystitis. No change in a small multilocular cystic lesion in the tail of the pancreas. Remote T3, T6 and T12 compression fractures. Vertebral body height loss of T6 has progressed since the prior examinations. Remote pubic ramus fractures are also noted.  Electronically Signed   By: Drusilla Kanner M.D.   On: 04/25/2015 16:03   Ct Maxillofacial Wo Cm  04/25/2015  CLINICAL DATA:  Fall.  Altered mental status.  Bruises. EXAM: CT HEAD WITHOUT CONTRAST CT MAXILLOFACIAL WITHOUT CONTRAST CT CERVICAL SPINE WITHOUT CONTRAST TECHNIQUE: Multidetector CT imaging of the head, cervical spine, and maxillofacial structures were performed using the standard protocol without intravenous contrast. Multiplanar CT image reconstructions of the cervical spine and maxillofacial structures were also generated. COMPARISON:  04/17/2015 head, cervical spine and maxillofacial CT. FINDINGS: CT HEAD FINDINGS Left frontal scalp contusion is decreased since 04/17/2015. No evidence  of parenchymal hemorrhage or extra-axial fluid collection. No mass lesion, mass effect, or midline shift. No CT evidence of acute infarction. Intracranial atherosclerosis. Diffuse cerebral volume loss. Nonspecific prominent stable subcortical and periventricular white matter hypodensity, most in keeping with chronic small vessel ischemic change. Stable small right lentiform nucleus lacunar infarct. No ventriculomegaly. There are new small air-fluid levels in the bilateral maxillary sinuses, with associated mucosal thickening in the bilateral maxillary sinuses. Partial opacification of the bilateral ethmoidal air cells. Stable tiny mucous retention cyst versus polyp in the posterior left frontal sinus. Mild mucosal thickening and inspissated secretions in the sphenoid sinus. New partial opacification of the right mastoid air cells. Left mastoid air cells are unopacified. No evidence of calvarial fracture. CT MAXILLOFACIAL FINDINGS Re- demonstration of small left frontal scalp contusion. No maxillofacial fracture. The maxilla and mandible appear intact. The nasal bones are unremarkable in appearance. There is stable levocurvature of the intact appearing nasal septum. No dislocation at the temporomandibular joints.  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 The visualized dentition demonstrates no acute abnormality. The orbits are intact bilaterally. Re- demonstration of a new small fluid levels in the bilateral maxillary sinuses with associated mild mucosal thickening in the bilateral maxillary sinuses. Partial opacification of the bilateral ethmoidal air cells. Stable tiny mucous retention cyst versus polyp in the posterior left frontal sinus. Mild mucosal thickening with mild inspissated secretions in the sphenoid sinus. No partial opacification of right mastoid air cells. Unopacified left mastoid air cells. No aggressive appearing focal osseous lesions. The parapharyngeal fat planes are preserved. The nasopharynx, oropharynx and hypopharynx are unremarkable in appearance. The parotid and submandibular glands are within normal limits. No cervical lymphadenopathy is seen. CT CERVICAL SPINE FINDINGS No fracture is detected in the cervical spine. Stable subacute to chronic moderate T3 vertebral compression fracture with approximately 40% loss of vertebral body height, unchanged since 04/17/2015. No prevertebral soft tissue swelling. Normal cervical lordosis. Dens is well positioned between the lateral masses of C1. The lateral masses appear well-aligned. Moderate degenerative disc disease throughout the cervical spine, most prominent at C6-7. Moderate facet arthropathy in the cervical spine, asymmetric to the left. No significant cervical foraminal stenosis. No cervical spine subluxation. New partial opacification of the right mastoid air cells. Visualized left mastoid air cells appear unopacified. No evidence of intra-axial hemorrhage in the visualized brain. No gross cervical canal hematoma. Apical right upper lobe 4 mm pulmonary nodule (series 12/ image 73), stable since 04/17/2015. No cervical adenopathy or other significant neck soft tissue abnormality. IMPRESSION: 1. Small left frontal scalp contusion,  decreased since 04/17/2015. No evidence of acute intracranial abnormality. No calvarial fracture. 2. No maxillofacial fracture. 3. No fracture or subluxation in the cervical spine. 4. Subacute to chronic moderate T3 vertebral compression fracture, stable. 5. Acute paranasal sinusitis, worsened. New partial opacification of the right mastoid air cells, nonspecific, favor a mild infectious or inflammatory right mastoiditis. 6. Diffuse cerebral atrophy, prominent chronic small vessel ischemic white matter change and chronic right basal ganglia lacunar infarct. Electronically Signed   By: Delbert Phenix M.D.   On: 04/25/2015 16:14    Assessment/Plan  Generalized weakness Will have patient work with PT/OT as tolerated to regain strength and restore function.  Fall precautions are in place.  CAP Continue and complete course of levaquin. Monitor her breathing. Add florastor 250 mg bid x 1 week  Acute abdominal pain With generalized tenderness. No guarding or rigidity on exam. On review of ct abdomen and plevis from recent hospital  admission. A small gallstone was noted and pancreatic lesion was noted to be stable and no signs of cholecystitis was noted. No signs of acute abdomen on exam. Get cbc with diff, cmp, amylase and lipase checked. Monitor VS bid for now, monitor for nausea or vomiting or diarrhea. Give tylenol 650 mg q6h prn pain for now  afib Rate controlled. Continue lopressor 25 mg bid for rate control, amiodarone 200 mg daily, aspirin ec 325 mg daily  Protein calorie malnutrition Monitor po intake and weight, get dietary consult, continue feeding supplement  Dementia Continue namenda for now. Will have her work with physical therapy and occupational therapy team to help with gait training and muscle strengthening exercises to help with ADLs.fall precautions. Skin care. Encourage to be out of bed. Get SLP consult  HTN Stable BP continue norvasc 5 mg daily, lisinopril 40 mg daily and  lopressor 25 mg bid  Constipation On senna s bid and daily miralax, monitor  HLD Continue lipitor 40 mg daily  Gad Continue current regimen of valium  Osteoporosis Continue weekly fosamax   Goals of care: short term rehabilitation   Labs/tests ordered: cbc, cmp, amylase, lipase  Family/ staff Communication: reviewed care plan with patient and nursing supervisor    Oneal Grout, MD  [facility] [phone] (Monday-Friday 8 am - 5 pm) [phone] (afterhours)

## 2015-05-02 LAB — CBC AND DIFFERENTIAL
HEMATOCRIT: 34 % — AB (ref 36–46)
HEMOGLOBIN: 10.8 g/dL — AB (ref 12.0–16.0)
NEUTROS ABS: 4 /uL
PLATELETS: 420 10*3/uL — AB (ref 150–399)
WBC: 5.8 10*3/mL

## 2015-05-02 LAB — BASIC METABOLIC PANEL
BUN: 17 mg/dL (ref 4–21)
CREATININE: 0.6 mg/dL (ref 0.5–1.1)
Glucose: 83 mg/dL
Potassium: 4.6 mmol/L (ref 3.4–5.3)
Sodium: 140 mmol/L (ref 137–147)

## 2015-05-02 LAB — HEPATIC FUNCTION PANEL
ALK PHOS: 107 U/L (ref 25–125)
ALT: 38 U/L — AB (ref 7–35)
AST: 32 U/L (ref 13–35)
BILIRUBIN, TOTAL: 0.4 mg/dL

## 2015-05-10 ENCOUNTER — Other Ambulatory Visit: Payer: Self-pay | Admitting: Family Medicine

## 2015-05-10 NOTE — Telephone Encounter (Signed)
Medication filled to pharmacy as requested.   

## 2015-05-22 ENCOUNTER — Encounter: Payer: Self-pay | Admitting: Neurology

## 2015-05-22 ENCOUNTER — Encounter: Payer: Self-pay | Admitting: Adult Health

## 2015-05-22 ENCOUNTER — Non-Acute Institutional Stay (SKILLED_NURSING_FACILITY): Payer: Commercial Managed Care - HMO | Admitting: Adult Health

## 2015-05-22 DIAGNOSIS — F0390 Unspecified dementia without behavioral disturbance: Secondary | ICD-10-CM

## 2015-05-22 DIAGNOSIS — I10 Essential (primary) hypertension: Secondary | ICD-10-CM

## 2015-05-22 DIAGNOSIS — R1310 Dysphagia, unspecified: Secondary | ICD-10-CM | POA: Diagnosis not present

## 2015-05-22 DIAGNOSIS — F411 Generalized anxiety disorder: Secondary | ICD-10-CM | POA: Diagnosis not present

## 2015-05-22 DIAGNOSIS — E785 Hyperlipidemia, unspecified: Secondary | ICD-10-CM

## 2015-05-22 DIAGNOSIS — D638 Anemia in other chronic diseases classified elsewhere: Secondary | ICD-10-CM

## 2015-05-22 DIAGNOSIS — E46 Unspecified protein-calorie malnutrition: Secondary | ICD-10-CM | POA: Diagnosis not present

## 2015-05-22 DIAGNOSIS — R531 Weakness: Secondary | ICD-10-CM

## 2015-05-22 DIAGNOSIS — M810 Age-related osteoporosis without current pathological fracture: Secondary | ICD-10-CM | POA: Diagnosis not present

## 2015-05-22 DIAGNOSIS — K5900 Constipation, unspecified: Secondary | ICD-10-CM | POA: Diagnosis not present

## 2015-05-22 DIAGNOSIS — I480 Paroxysmal atrial fibrillation: Secondary | ICD-10-CM

## 2015-05-22 NOTE — Progress Notes (Signed)
Patient ID: Jacqueline [name], female   DOB: 04-16-1928, 82 y.o.   MRN: [id number]    DATE:  05/22/2015   MRN:  [id number]  BIRTHDAY: 1928/12/24  Facility:  Nursing Home Location:  [facility]  Nursing Home Room Number: 905-2  LEVEL OF CARE:  SNF [phone])  Contact Information    Name Relation Home Work [place] Son [phone]  [phone]   [name], [name] Daughter [phone]  [phone]       Code Status History    Date Active Date Inactive Code Status Order ID Comments User Context   04/25/2015  9:56 PM 04/30/2015  9:46 PM DNR [id number]  Hillary Bow, DO Inpatient   04/25/2015  9:50 PM 04/25/2015  9:56 PM Full Code [id number]  Hillary Bow, DO Inpatient   01/16/2015 12:46 AM 01/18/2015  4:33 PM DNR [id number]  Lorretta Harp, MD ED   10/20/2014  1:44 PM 10/23/2014  7:22 PM DNR [id number]  Leatha Gilding, MD ED   08/29/2012 11:50 AM 09/02/2012  4:24 PM Full Code [id number]  Alleen Borne, MD Inpatient   08/25/2012  1:43 PM 08/29/2012 11:50 AM Full Code [id number]  Rowe Clack, PA-C Inpatient    Questions for Most Recent Historical Code Status (Order [id number])    Question Answer Comment   In the event of cardiac or respiratory ARREST Do not call a "code blue"    In the event of cardiac or respiratory ARREST Do not perform Intubation, CPR, defibrillation or ACLS    In the event of cardiac or respiratory ARREST Use medication by any route, position, wound care, and other measures to relive pain and suffering. May use oxygen, suction and manual treatment of airway obstruction as needed for comfort.     Advance Directive Documentation        Most Recent Value   Type of Advance Directive  Out of facility DNR (pink MOST or yellow form)   Pre-existing out of facility DNR order (yellow form or pink MOST form)     "MOST" Form in Place?         Chief Complaint  Patient presents with  . Medical Management of Chronic Issues    Generalized weakness, Afib,  protein-calorie, dementia, hypertension, constipation, hyperlipidemia, anxiety, osteoporosis, anemia and dysphagia    HISTORY OF PRESENT ILLNESS:  This is an 82 year old female who is being seen for a routine visit. She has been admitted to [facility] on 04/30/15 from [facility] green she was treated for right upper lobe pneumonia and dysphagia. She has PMH of hypertension, dementia, atrial fibrillation and constipation. She is being followed-up by Speech Therapist and was recently changed to soft with thin liquids for meals. Latest albumin is 2.48 and she was recently started on Procel for supplementation. RD consult was ordered. She was having increased anxiety so Valium was recently increased to 1 mg PO BID PRN.  She has been admitted for a short-term rehabilitation.  PAST MEDICAL HISTORY:  Past Medical History  Diagnosis Date  . Diverticulitis   . Hypertension   . Osteoporosis   . CAD (coronary artery disease)   . Dementia   . PAF (paroxysmal atrial fibrillation) (HCC)   . HLD (hyperlipidemia)   . GERD (gastroesophageal reflux disease)   . Community acquired pneumonia   . Dyslipidemia   . Constipation   . Anorexia   . UTI (lower urinary tract infection)   . Generalized weakness   . Compression  fracture of T12 vertebra (HCC)   . Hyponatremia   . Pressure ulcer   . Depression   . Severe muscle deconditioning   . Right knee pain   . Allergic rhinitis   . Protein calorie malnutrition (HCC)   . SOB (shortness of breath)   . Cerumen impaction   . Insomnia   . Hearing loss of aging      CURRENT MEDICATIONS: Reviewed  Patient's Medications  New Prescriptions   No medications on file  Previous Medications   ACETAMINOPHEN (TYLENOL) 325 MG TABLET    Take 2 tablets (650 mg total) by mouth every 6 (six) hours as needed for mild pain or headache.   ALENDRONATE (FOSAMAX) 70 MG TABLET    TAKE 1 TABLET BY MOUTH EVERY 7 DAYS. TAKE WITH A FULL GLASS OF WATER ON AN EMPTY  STOMACH   AMIODARONE (PACERONE) 200 MG TABLET    TAKE 1 TABLET BY MOUTH DAILY.   AMLODIPINE (NORVASC) 5 MG TABLET    Take 1 tablet (5 mg total) by mouth daily.   ASPIRIN EC 325 MG TABLET    Take 325 mg by mouth every evening.    ATORVASTATIN (LIPITOR) 40 MG TABLET    TAKE 1 TABLET BY MOUTH DAILY AT 6 PM   BESIVANCE 0.6 % SUSP    PLACE 1 DROP IN RIGHT EYE ONE TIME  A DAY   DIAZEPAM (VALIUM) 2 MG TABLET    1/2 tab daily and then use other 1/2 as needed for high anxiety moments   DIFLUPREDNATE (DUREZOL) 0.05 % EMUL    Apply 1 drop to eye 3 (three) times daily. One drop in left eye three times daily   FOLIC ACID (FOLVITE) 1 MG TABLET    TAKE 1 TABLET BY MOUTH EVERY DAY   ILEVRO 0.3 % OPHTHALMIC SUSPENSION    INSTILL 1 DROP IN RIGHT EYE AT AFTERNOON   LATANOPROST (XALATAN) 0.005 % OPHTHALMIC SOLUTION    Place 1 drop into both eyes at bedtime.   LISINOPRIL (PRINIVIL,ZESTRIL) 40 MG TABLET    Take 1 tablet (40 mg total) by mouth daily.   MEMANTINE (NAMENDA) 10 MG TABLET    TAKE 1 TABLET (10 MG TOTAL) BY MOUTH 2 (TWO) TIMES DAILY.   METOPROLOL TARTRATE (LOPRESSOR) 25 MG TABLET    Take 1 tablet by mouth 2 (two) times daily.   POLYETHYLENE GLYCOL (MIRALAX / GLYCOLAX) PACKET    Take 17 g by mouth daily as needed.   PROTEIN (PROCEL 100) POWD    Take 2 scoop by mouth 2 (two) times daily.   SENNA-DOCUSATE (SENOKOT-S) 8.6-50 MG TABLET    Take 1 tablet by mouth 2 (two) times daily.   UNABLE TO FIND    Take 120 mLs by mouth 3 (three) times daily. Med Name: MedPass 120 mL PO TID in between meals  Modified Medications   No medications on file  Discontinued Medications   FEEDING SUPPLEMENT, ENSURE ENLIVE, (ENSURE ENLIVE) LIQD    Take 237 mLs by mouth 3 (three) times daily between meals.   LEVOFLOXACIN (LEVAQUIN) 500 MG TABLET    Take 1 tablet (500 mg total) by mouth daily. For 3days   LUMIGAN 0.01 % SOLN    PLACE 1 DROP INTO BOTH EYES AT BEDTIME.     No Known Allergies   REVIEW OF SYSTEMS:  GENERAL: no  change in appetite, no fatigue, no weight changes, no fever, chills or weakness EYES: Denies change in vision, dry eyes, eye pain,  itching or discharge EARS: Denies change in hearing, ringing in ears, or earache NOSE: Denies nasal congestion or epistaxis MOUTH and THROAT: Denies oral discomfort, gingival pain or bleeding, pain from teeth or hoarseness   RESPIRATORY: no cough, SOB, DOE, wheezing, hemoptysis CARDIAC: no chest pain, edema or palpitations GI: no abdominal pain, diarrhea, constipation, heart burn, nausea or vomiting GU: Denies dysuria, frequency, hematuria, incontinence, or discharge PSYCHIATRIC: Denies feeling of depression or anxiety. No report of hallucinations, insomnia, paranoia, or agitation   PHYSICAL EXAMINATION  GENERAL APPEARANCE: Well nourished. In no acute distress. Normal body habitus SKIN:  Skin is warm and dry. There are no suspicious lesions or rash HEAD: Normal in size and contour. No evidence of trauma EYES: Lids open and close normally. No blepharitis, entropion or ectropion. PERRL. Conjunctivae are clear and sclerae are white. Lenses are without opacity EARS: Pinnae are normal. Patient hears normal voice tunes of the examiner MOUTH and THROAT: Lips are without lesions. Oral mucosa is moist and without lesions. Tongue is normal in shape, size, and color and without lesions NECK: supple, trachea midline, no neck masses, no thyroid tenderness, no thyromegaly LYMPHATICS: no LAN in the neck, no supraclavicular LAN RESPIRATORY: breathing is even & unlabored, BS CTAB CARDIAC: RRR, no murmur,no extra heart sounds, no edema GI: abdomen soft, normal BS, no masses, no tenderness, no hepatomegaly, no splenomegaly EXTREMITIES:  Able to move X 4 extremities PSYCHIATRIC: Alert and oriented X 3. Affect and behavior are appropriate  LABS/RADIOLOGY: Labs reviewed: Basic Metabolic Panel:  Recent Labs  40/98/11 1138  04/27/15 0514 04/28/15 0550 04/30/15 0523 05/02/15   NA 136  < > 142 140 138 140  K 4.0  < > 3.5 3.9 4.5 4.6  CL 99  < > 108 103 101  --   CO2 31  < > 27 30 28   --   GLUCOSE 75  < > 94 125* 90  --   BUN 15  < > 13 14 15 17   CREATININE 0.66  < > 0.49 0.66 0.59 0.6  CALCIUM 9.5  < > 8.5* 8.9 8.8*  --   MG 1.9  --   --   --   --   --   < > = values in this interval not displayed. Liver Function Tests:  Recent Labs  01/15/15 2106 01/22/15 1138 04/25/15 1430 05/02/15  AST 38 28 112* 32  ALT 32 28 143* 38*  ALKPHOS 73 77 73 107  BILITOT 1.0 0.4 0.6  --   PROT 6.3* 6.6 6.3*  --   ALBUMIN 3.3* 3.4* 2.7*  --     Recent Labs  01/12/15 0750 01/16/15 0245 04/25/15 1430  LIPASE 38 35 53*   CBC:  Recent Labs  04/17/15 1230  04/25/15 1430 04/27/15 0514 04/28/15 0550 04/30/15 0523 05/02/15  WBC 8.4  --  9.5 6.6 7.7 7.9 5.8  NEUTROABS 7.3  --  8.3*  --   --   --  4  HGB 11.8*  < > 11.8* 11.0* 12.3 12.1 10.8*  HCT 36.8  < > 37.0 33.6* 37.2 37.5 34*  MCV 103.1*  --  103.4* 102.1* 102.2* 102.5*  --   PLT 188  --  230 241 291 342 420*  < > = values in this interval not displayed.  Lipid Panel:  Recent Labs  01/22/15 1138  HDL 49.30   Cardiac Enzymes:  Recent Labs  01/16/15 0947 01/16/15 1540 04/25/15 1430  TROPONINI <0.03 <0.03 <0.03  CBG:  Recent Labs  04/25/15 1451  GLUCAP 140*      Ct Head Wo Contrast  04/25/2015  CLINICAL DATA:  Fall.  Altered mental status.  Bruises. EXAM: CT HEAD WITHOUT CONTRAST CT MAXILLOFACIAL WITHOUT CONTRAST CT CERVICAL SPINE WITHOUT CONTRAST TECHNIQUE: Multidetector CT imaging of the head, cervical spine, and maxillofacial structures were performed using the standard protocol without intravenous contrast. Multiplanar CT image reconstructions of the cervical spine and maxillofacial structures were also generated. COMPARISON:  04/17/2015 head, cervical spine and maxillofacial CT. FINDINGS: CT HEAD FINDINGS Left frontal scalp contusion is decreased since 04/17/2015. No evidence of  parenchymal hemorrhage or extra-axial fluid collection. No mass lesion, mass effect, or midline shift. No CT evidence of acute infarction. Intracranial atherosclerosis. Diffuse cerebral volume loss. Nonspecific prominent stable subcortical and periventricular white matter hypodensity, most in keeping with chronic small vessel ischemic change. Stable small right lentiform nucleus lacunar infarct. No ventriculomegaly. There are new small air-fluid levels in the bilateral maxillary sinuses, with associated mucosal thickening in the bilateral maxillary sinuses. Partial opacification of the bilateral ethmoidal air cells. Stable tiny mucous retention cyst versus polyp in the posterior left frontal sinus. Mild mucosal thickening and inspissated secretions in the sphenoid sinus. New partial opacification of the right mastoid air cells. Left mastoid air cells are unopacified. No evidence of calvarial fracture. CT MAXILLOFACIAL FINDINGS Re- demonstration of small left frontal scalp contusion. No maxillofacial fracture. The maxilla and mandible appear intact. The nasal bones are unremarkable in appearance. There is stable levocurvature of the intact appearing nasal septum. No dislocation at the temporomandibular joints. 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 The visualized dentition demonstrates no acute abnormality. The orbits are intact bilaterally. Re- demonstration of a new small fluid levels in the bilateral maxillary sinuses with associated mild mucosal thickening in the bilateral maxillary sinuses. Partial opacification of the bilateral ethmoidal air cells. Stable tiny mucous retention cyst versus polyp in the posterior left frontal sinus. Mild mucosal thickening with mild inspissated secretions in the sphenoid sinus. No partial opacification of right mastoid air cells. Unopacified left mastoid air cells. No aggressive appearing focal osseous lesions. The parapharyngeal fat planes are preserved. The  nasopharynx, oropharynx and hypopharynx are unremarkable in appearance. The parotid and submandibular glands are within normal limits. No cervical lymphadenopathy is seen. CT CERVICAL SPINE FINDINGS No fracture is detected in the cervical spine. Stable subacute to chronic moderate T3 vertebral compression fracture with approximately 40% loss of vertebral body height, unchanged since 04/17/2015. No prevertebral soft tissue swelling. Normal cervical lordosis. Dens is well positioned between the lateral masses of C1. The lateral masses appear well-aligned. Moderate degenerative disc disease throughout the cervical spine, most prominent at C6-7. Moderate facet arthropathy in the cervical spine, asymmetric to the left. No significant cervical foraminal stenosis. No cervical spine subluxation. New partial opacification of the right mastoid air cells. Visualized left mastoid air cells appear unopacified. No evidence of intra-axial hemorrhage in the visualized brain. No gross cervical canal hematoma. Apical right upper lobe 4 mm pulmonary nodule (series 12/ image 73), stable since 04/17/2015. No cervical adenopathy or other significant neck soft tissue abnormality. IMPRESSION: 1. Small left frontal scalp contusion, decreased since 04/17/2015. No evidence of acute intracranial abnormality. No calvarial fracture. 2. No maxillofacial fracture. 3. No fracture or subluxation in the cervical spine. 4. Subacute to chronic moderate T3 vertebral compression fracture, stable. 5. Acute paranasal sinusitis, worsened. New partial opacification of the right mastoid air cells, nonspecific, favor a mild infectious or inflammatory  right mastoiditis. 6. Diffuse cerebral atrophy, prominent chronic small vessel ischemic white matter change and chronic right basal ganglia lacunar infarct. Electronically Signed   By: Delbert Phenix M.D.   On: 04/25/2015 16:14   Ct Chest W Contrast  04/25/2015  CLINICAL DATA:  Status post fall last night. Pain  and bruising. Initial encounter. EXAM: CT CHEST, ABDOMEN, AND PELVIS WITH CONTRAST TECHNIQUE: Multidetector CT imaging of the chest, abdomen and pelvis was performed following the standard protocol during bolus administration of intravenous contrast. CONTRAST:  100 ML OMNIPAQUE IOHEXOL 300 MG/ML  SOLN COMPARISON:  CT abdomen and pelvis 01/12/2015. PA and lateral chest 01/12/2015. MRI abdomen 12/21/2014. FINDINGS: CT CHEST FINDINGS Mediastinum/Lymph Nodes: No evidence of posttraumatic change is identified. There is cardiomegaly. Calcific aortic and coronary atherosclerosis is noted. The patient is status post CABG. Lungs/Pleura: Trace bilateral pleural effusions are seen. There is an area of ground-glass attenuation in the anterior right upper lobe. Dependent atelectasis is noted. Musculoskeletal: Remote T3 and T12 compression fractures are identified. Vertebral body height loss of T6 has progressed since the prior PA and lateral chest films. No acute fracture is identified. CT ABDOMEN PELVIS FINDINGS Hepatobiliary: A single small gallstone is identified. No evidence of cholecystitis is seen. No biliary ductal dilatation. Pancreas: Multilocular cystic lesion in the tail the pancreas is unchanged. Spleen: Unremarkable. Adrenals/Urinary Tract: The adrenal glands appear normal. Renal cysts are unchanged. Stomach/Bowel: Surgical anastomosis in the sigmoid colon is noted. The colon is otherwise unremarkable. Stomach and small bowel appear normal. Vascular/Lymphatic: IVC filter is noted. The patient has extensive aortoiliac atherosclerosis without aneurysm. Reproductive: Status post hysterectomy. Other: None. Musculoskeletal: Right hip replacement is seen. Remote pubic ramus fractures are present bilaterally. Lower lumbar spondylosis is noted. IMPRESSION: Negative for trauma to the chest, abdomen or pelvis. Small area of ground-glass attenuation anterior right upper lobe may be due to infectious or inflammatory process.  Cardiomegaly. Extensive aortic and coronary atherosclerosis. Single small gallstone without evidence of cholecystitis. No change in a small multilocular cystic lesion in the tail of the pancreas. Remote T3, T6 and T12 compression fractures. Vertebral body height loss of T6 has progressed since the prior examinations. Remote pubic ramus fractures are also noted. Electronically Signed   By: Drusilla Kanner M.D.   On: 04/25/2015 16:03   Ct Cervical Spine Wo Contrast  04/25/2015  CLINICAL DATA:  Fall.  Altered mental status.  Bruises. EXAM: CT HEAD WITHOUT CONTRAST CT MAXILLOFACIAL WITHOUT CONTRAST CT CERVICAL SPINE WITHOUT CONTRAST TECHNIQUE: Multidetector CT imaging of the head, cervical spine, and maxillofacial structures were performed using the standard protocol without intravenous contrast. Multiplanar CT image reconstructions of the cervical spine and maxillofacial structures were also generated. COMPARISON:  04/17/2015 head, cervical spine and maxillofacial CT. FINDINGS: CT HEAD FINDINGS Left frontal scalp contusion is decreased since 04/17/2015. No evidence of parenchymal hemorrhage or extra-axial fluid collection. No mass lesion, mass effect, or midline shift. No CT evidence of acute infarction. Intracranial atherosclerosis. Diffuse cerebral volume loss. Nonspecific prominent stable subcortical and periventricular white matter hypodensity, most in keeping with chronic small vessel ischemic change. Stable small right lentiform nucleus lacunar infarct. No ventriculomegaly. There are new small air-fluid levels in the bilateral maxillary sinuses, with associated mucosal thickening in the bilateral maxillary sinuses. Partial opacification of the bilateral ethmoidal air cells. Stable tiny mucous retention cyst versus polyp in the posterior left frontal sinus. Mild mucosal thickening and inspissated secretions in the sphenoid sinus. New partial opacification of the right mastoid air cells. Left  mastoid air cells  are unopacified. No evidence of calvarial fracture. CT MAXILLOFACIAL FINDINGS Re- demonstration of small left frontal scalp contusion. No maxillofacial fracture. The maxilla and mandible appear intact. The nasal bones are unremarkable in appearance. There is stable levocurvature of the intact appearing nasal septum. No dislocation at the temporomandibular joints. 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 The visualized dentition demonstrates no acute abnormality. The orbits are intact bilaterally. Re- demonstration of a new small fluid levels in the bilateral maxillary sinuses with associated mild mucosal thickening in the bilateral maxillary sinuses. Partial opacification of the bilateral ethmoidal air cells. Stable tiny mucous retention cyst versus polyp in the posterior left frontal sinus. Mild mucosal thickening with mild inspissated secretions in the sphenoid sinus. No partial opacification of right mastoid air cells. Unopacified left mastoid air cells. No aggressive appearing focal osseous lesions. The parapharyngeal fat planes are preserved. The nasopharynx, oropharynx and hypopharynx are unremarkable in appearance. The parotid and submandibular glands are within normal limits. No cervical lymphadenopathy is seen. CT CERVICAL SPINE FINDINGS No fracture is detected in the cervical spine. Stable subacute to chronic moderate T3 vertebral compression fracture with approximately 40% loss of vertebral body height, unchanged since 04/17/2015. No prevertebral soft tissue swelling. Normal cervical lordosis. Dens is well positioned between the lateral masses of C1. The lateral masses appear well-aligned. Moderate degenerative disc disease throughout the cervical spine, most prominent at C6-7. Moderate facet arthropathy in the cervical spine, asymmetric to the left. No significant cervical foraminal stenosis. No cervical spine subluxation. New partial opacification of the right mastoid air cells.  Visualized left mastoid air cells appear unopacified. No evidence of intra-axial hemorrhage in the visualized brain. No gross cervical canal hematoma. Apical right upper lobe 4 mm pulmonary nodule (series 12/ image 73), stable since 04/17/2015. No cervical adenopathy or other significant neck soft tissue abnormality. IMPRESSION: 1. Small left frontal scalp contusion, decreased since 04/17/2015. No evidence of acute intracranial abnormality. No calvarial fracture. 2. No maxillofacial fracture. 3. No fracture or subluxation in the cervical spine. 4. Subacute to chronic moderate T3 vertebral compression fracture, stable. 5. Acute paranasal sinusitis, worsened. New partial opacification of the right mastoid air cells, nonspecific, favor a mild infectious or inflammatory right mastoiditis. 6. Diffuse cerebral atrophy, prominent chronic small vessel ischemic white matter change and chronic right basal ganglia lacunar infarct. Electronically Signed   By: Delbert Phenix M.D.   On: 04/25/2015 16:14   Ct Abdomen Pelvis W Contrast  04/25/2015  CLINICAL DATA:  Status post fall last night. Pain and bruising. Initial encounter. EXAM: CT CHEST, ABDOMEN, AND PELVIS WITH CONTRAST TECHNIQUE: Multidetector CT imaging of the chest, abdomen and pelvis was performed following the standard protocol during bolus administration of intravenous contrast. CONTRAST:  100 ML OMNIPAQUE IOHEXOL 300 MG/ML  SOLN COMPARISON:  CT abdomen and pelvis 01/12/2015. PA and lateral chest 01/12/2015. MRI abdomen 12/21/2014. FINDINGS: CT CHEST FINDINGS Mediastinum/Lymph Nodes: No evidence of posttraumatic change is identified. There is cardiomegaly. Calcific aortic and coronary atherosclerosis is noted. The patient is status post CABG. Lungs/Pleura: Trace bilateral pleural effusions are seen. There is an area of ground-glass attenuation in the anterior right upper lobe. Dependent atelectasis is noted. Musculoskeletal: Remote T3 and T12 compression fractures  are identified. Vertebral body height loss of T6 has progressed since the prior PA and lateral chest films. No acute fracture is identified. CT ABDOMEN PELVIS FINDINGS Hepatobiliary: A single small gallstone is identified. No evidence of cholecystitis is seen. No biliary ductal dilatation. Pancreas:  Multilocular cystic lesion in the tail the pancreas is unchanged. Spleen: Unremarkable. Adrenals/Urinary Tract: The adrenal glands appear normal. Renal cysts are unchanged. Stomach/Bowel: Surgical anastomosis in the sigmoid colon is noted. The colon is otherwise unremarkable. Stomach and small bowel appear normal. Vascular/Lymphatic: IVC filter is noted. The patient has extensive aortoiliac atherosclerosis without aneurysm. Reproductive: Status post hysterectomy. Other: None. Musculoskeletal: Right hip replacement is seen. Remote pubic ramus fractures are present bilaterally. Lower lumbar spondylosis is noted. IMPRESSION: Negative for trauma to the chest, abdomen or pelvis. Small area of ground-glass attenuation anterior right upper lobe may be due to infectious or inflammatory process. Cardiomegaly. Extensive aortic and coronary atherosclerosis. Single small gallstone without evidence of cholecystitis. No change in a small multilocular cystic lesion in the tail of the pancreas. Remote T3, T6 and T12 compression fractures. Vertebral body height loss of T6 has progressed since the prior examinations. Remote pubic ramus fractures are also noted. Electronically Signed   By: Drusilla Kanner M.D.   On: 04/25/2015 16:03   Ct Maxillofacial Wo Cm  04/25/2015  CLINICAL DATA:  Fall.  Altered mental status.  Bruises. EXAM: CT HEAD WITHOUT CONTRAST CT MAXILLOFACIAL WITHOUT CONTRAST CT CERVICAL SPINE WITHOUT CONTRAST TECHNIQUE: Multidetector CT imaging of the head, cervical spine, and maxillofacial structures were performed using the standard protocol without intravenous contrast. Multiplanar CT image reconstructions of the  cervical spine and maxillofacial structures were also generated. COMPARISON:  04/17/2015 head, cervical spine and maxillofacial CT. FINDINGS: CT HEAD FINDINGS Left frontal scalp contusion is decreased since 04/17/2015. No evidence of parenchymal hemorrhage or extra-axial fluid collection. No mass lesion, mass effect, or midline shift. No CT evidence of acute infarction. Intracranial atherosclerosis. Diffuse cerebral volume loss. Nonspecific prominent stable subcortical and periventricular white matter hypodensity, most in keeping with chronic small vessel ischemic change. Stable small right lentiform nucleus lacunar infarct. No ventriculomegaly. There are new small air-fluid levels in the bilateral maxillary sinuses, with associated mucosal thickening in the bilateral maxillary sinuses. Partial opacification of the bilateral ethmoidal air cells. Stable tiny mucous retention cyst versus polyp in the posterior left frontal sinus. Mild mucosal thickening and inspissated secretions in the sphenoid sinus. New partial opacification of the right mastoid air cells. Left mastoid air cells are unopacified. No evidence of calvarial fracture. CT MAXILLOFACIAL FINDINGS Re- demonstration of small left frontal scalp contusion. No maxillofacial fracture. The maxilla and mandible appear intact. The nasal bones are unremarkable in appearance. There is stable levocurvature of the intact appearing nasal septum. No dislocation at the temporomandibular joints. 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 The visualized dentition demonstrates no acute abnormality. The orbits are intact bilaterally. Re- demonstration of a new small fluid levels in the bilateral maxillary sinuses with associated mild mucosal thickening in the bilateral maxillary sinuses. Partial opacification of the bilateral ethmoidal air cells. Stable tiny mucous retention cyst versus polyp in the posterior left frontal sinus. Mild mucosal thickening with  mild inspissated secretions in the sphenoid sinus. No partial opacification of right mastoid air cells. Unopacified left mastoid air cells. No aggressive appearing focal osseous lesions. The parapharyngeal fat planes are preserved. The nasopharynx, oropharynx and hypopharynx are unremarkable in appearance. The parotid and submandibular glands are within normal limits. No cervical lymphadenopathy is seen. CT CERVICAL SPINE FINDINGS No fracture is detected in the cervical spine. Stable subacute to chronic moderate T3 vertebral compression fracture with approximately 40% loss of vertebral body height, unchanged since 04/17/2015. No prevertebral soft tissue swelling. Normal cervical lordosis. Dens is well positioned  between the lateral masses of C1. The lateral masses appear well-aligned. Moderate degenerative disc disease throughout the cervical spine, most prominent at C6-7. Moderate facet arthropathy in the cervical spine, asymmetric to the left. No significant cervical foraminal stenosis. No cervical spine subluxation. New partial opacification of the right mastoid air cells. Visualized left mastoid air cells appear unopacified. No evidence of intra-axial hemorrhage in the visualized brain. No gross cervical canal hematoma. Apical right upper lobe 4 mm pulmonary nodule (series 12/ image 73), stable since 04/17/2015. No cervical adenopathy or other significant neck soft tissue abnormality. IMPRESSION: 1. Small left frontal scalp contusion, decreased since 04/17/2015. No evidence of acute intracranial abnormality. No calvarial fracture. 2. No maxillofacial fracture. 3. No fracture or subluxation in the cervical spine. 4. Subacute to chronic moderate T3 vertebral compression fracture, stable. 5. Acute paranasal sinusitis, worsened. New partial opacification of the right mastoid air cells, nonspecific, favor a mild infectious or inflammatory right mastoiditis. 6. Diffuse cerebral atrophy, prominent chronic small vessel  ischemic white matter change and chronic right basal ganglia lacunar infarct. Electronically Signed   By: Delbert Phenix M.D.   On: 04/25/2015 16:14    ASSESSMENT/PLAN:   Generalized weakness - for rehabilitation  Atrial fibrillation - rate-controlled; continue Lopressor 25 mg twice a day, amiodarone 200 mg daily and aspirin EC 325 mg daily  Protein calorie malnutrition, severe - albumin 2.8; recently started on Procel 2 scoops by mouth twice a day  Dementia - stable; continue Namenda 10 mg 1 tab by mouth twice a day  Hypertension - controlled; continue Norvasc 5 mg daily, lisinopril 40 mg daily and Lopressor 25 mg twice a day  Constipation -  continue senna S1 tab by mouth twice a day and MiraLAX 17 g by mouth daily when necessary  Hyperlipidemia - doing atorvastatin 40 mg daily  Anxiety - mood is stable; recently increased Valium to 2 mg 1/2 tab= 1 mg BID PRN  Osteoporosis - continue Fosamax 70 mg 1 tab by mouth every Tuesdays  Dysphagia - recently started on mechanical soft diet with thin liquids; continue follow-up with ST  Anemia of chronic disease - hgb 10.8; check CBC     Goals of care:  Short-term rehabilitation     [facility], NP [facility] [phone]

## 2015-05-23 LAB — CBC AND DIFFERENTIAL
HCT: 34 % — AB (ref 36–46)
Hemoglobin: 10.9 g/dL — AB (ref 12.0–16.0)
Platelets: 206 10*3/uL (ref 150–399)
WBC: 7.4 10^3/mL

## 2015-05-24 ENCOUNTER — Non-Acute Institutional Stay: Payer: Commercial Managed Care - HMO | Admitting: Adult Health

## 2015-05-24 ENCOUNTER — Encounter: Payer: Self-pay | Admitting: Adult Health

## 2015-05-24 DIAGNOSIS — R531 Weakness: Secondary | ICD-10-CM | POA: Diagnosis not present

## 2015-05-24 DIAGNOSIS — E785 Hyperlipidemia, unspecified: Secondary | ICD-10-CM

## 2015-05-24 DIAGNOSIS — M810 Age-related osteoporosis without current pathological fracture: Secondary | ICD-10-CM

## 2015-05-24 DIAGNOSIS — E46 Unspecified protein-calorie malnutrition: Secondary | ICD-10-CM | POA: Diagnosis not present

## 2015-05-24 DIAGNOSIS — J189 Pneumonia, unspecified organism: Secondary | ICD-10-CM | POA: Diagnosis not present

## 2015-05-24 DIAGNOSIS — F411 Generalized anxiety disorder: Secondary | ICD-10-CM

## 2015-05-24 DIAGNOSIS — I480 Paroxysmal atrial fibrillation: Secondary | ICD-10-CM | POA: Diagnosis not present

## 2015-05-24 DIAGNOSIS — R1310 Dysphagia, unspecified: Secondary | ICD-10-CM

## 2015-05-24 DIAGNOSIS — D638 Anemia in other chronic diseases classified elsewhere: Secondary | ICD-10-CM | POA: Diagnosis not present

## 2015-05-24 DIAGNOSIS — F0390 Unspecified dementia without behavioral disturbance: Secondary | ICD-10-CM

## 2015-05-24 DIAGNOSIS — I10 Essential (primary) hypertension: Secondary | ICD-10-CM

## 2015-05-24 NOTE — Progress Notes (Signed)
Patient ID: Jacqueline [name], female   DOB: [birth year], 82 y.o.   MRN: [id number]    DATE:  05/24/2015   MRN:  [id number]  BIRTHDAY: [birth year]  Facility:  Nursing Home Location:  [facility]  Nursing Home Room Number: 905-2  LEVEL OF CARE:  SNF [phone])  Contact Information    Name Relation Home Work [place] Son [phone]  [phone]   [place], [place]ia Daughter [phone]  [phone]       Code Status History    Date Active Date Inactive Code Status Order ID Comments User Context   04/25/2015  9:56 PM 04/30/2015  9:46 PM DNR [id number]  Hillary Bow, DO Inpatient   04/25/2015  9:50 PM 04/25/2015  9:56 PM Full Code [id number]  Hillary Bow, DO Inpatient   01/16/2015 12:46 AM 01/18/2015  4:33 PM DNR [id number]  Lorretta Harp, MD ED   10/20/2014  1:44 PM 10/23/2014  7:22 PM DNR [id number]  Leatha Gilding, MD ED   08/29/2012 11:50 AM 09/02/2012  4:24 PM Full Code [id number]  Alleen Borne, MD Inpatient   08/25/2012  1:43 PM 08/29/2012 11:50 AM Full Code [id number]  Rowe Clack, PA-C Inpatient    Questions for Most Recent Historical Code Status (Order [id number])    Question Answer Comment   In the event of cardiac or respiratory ARREST Do not call a "code blue"    In the event of cardiac or respiratory ARREST Do not perform Intubation, CPR, defibrillation or ACLS    In the event of cardiac or respiratory ARREST Use medication by any route, position, wound care, and other measures to relive pain and suffering. May use oxygen, suction and manual treatment of airway obstruction as needed for comfort.     Advance Directive Documentation        Most Recent Value   Type of Advance Directive  Out of facility DNR (pink MOST or yellow form)   Pre-existing out of facility DNR order (yellow form or pink MOST form)     "MOST" Form in Place?         Chief Complaint  Patient presents with  . Discharge Note    HISTORY OF PRESENT ILLNESS:  This is an  82 year old female who is for discharge home with Home health PT,OT, CNA and ST. DME: standard wheelchair with adjustable back, anti-tippers 16" X 18", cushion and standard leg rest. She has been admitted to [facility] on 04/30/15 from [facility] whrein she was treated for right upper lobe pneumonia and dysphagia. She has PMH of hypertension, dementia, atrial fibrillation and constipation.   Patient was admitted to this facility for short-term rehabilitation after the patient's recent hospitalization.  Patient has completed SNF rehabilitation and therapy has cleared the patient for discharge.  PAST MEDICAL HISTORY:  Past Medical History  Diagnosis Date  . Diverticulitis   . Hypertension   . Osteoporosis   . CAD (coronary artery disease)   . Dementia   . PAF (paroxysmal atrial fibrillation) (HCC)   . HLD (hyperlipidemia)   . GERD (gastroesophageal reflux disease)   . Community acquired pneumonia   . Dyslipidemia   . Constipation   . Anorexia   . UTI (lower urinary tract infection)   . Generalized weakness   . Compression fracture of T12 vertebra (HCC)   . Hyponatremia   . Pressure ulcer   . Depression   . Severe muscle deconditioning   . Right knee pain   .  Allergic rhinitis   . Protein calorie malnutrition (HCC)   . SOB (shortness of breath)   . Cerumen impaction   . Insomnia   . Hearing loss of aging      CURRENT MEDICATIONS: Reviewed  Patient's Medications  New Prescriptions   No medications on file  Previous Medications   ACETAMINOPHEN (TYLENOL) 325 MG TABLET    Take 2 tablets (650 mg total) by mouth every 6 (six) hours as needed for mild pain or headache.   ALENDRONATE (FOSAMAX) 70 MG TABLET    TAKE 1 TABLET BY MOUTH EVERY 7 DAYS. TAKE WITH A FULL GLASS OF WATER ON AN EMPTY STOMACH   AMIODARONE (PACERONE) 200 MG TABLET    TAKE 1 TABLET BY MOUTH DAILY.   AMLODIPINE (NORVASC) 5 MG TABLET    Take 1 tablet (5 mg total) by mouth daily.   ASPIRIN EC 325 MG TABLET     Take 325 mg by mouth every evening.    ATORVASTATIN (LIPITOR) 40 MG TABLET    TAKE 1 TABLET BY MOUTH DAILY AT 6 PM   BESIVANCE 0.6 % SUSP    PLACE 1 DROP IN RIGHT EYE ONE TIME  A DAY   DIAZEPAM (VALIUM) 2 MG TABLET    1/2 tab daily and then use other 1/2 as needed for high anxiety moments   DIFLUPREDNATE (DUREZOL) 0.05 % EMUL    Apply 1 drop to eye 3 (three) times daily. One drop in left eye three times daily   FOLIC ACID (FOLVITE) 1 MG TABLET    TAKE 1 TABLET BY MOUTH EVERY DAY   ILEVRO 0.3 % OPHTHALMIC SUSPENSION    INSTILL 1 DROP IN RIGHT EYE AT AFTERNOON   LATANOPROST (XALATAN) 0.005 % OPHTHALMIC SOLUTION    Place 1 drop into both eyes at bedtime.   LISINOPRIL (PRINIVIL,ZESTRIL) 40 MG TABLET    Take 1 tablet (40 mg total) by mouth daily.   MEMANTINE (NAMENDA) 10 MG TABLET    TAKE 1 TABLET (10 MG TOTAL) BY MOUTH 2 (TWO) TIMES DAILY.   METOPROLOL TARTRATE (LOPRESSOR) 25 MG TABLET    Take 1 tablet by mouth 2 (two) times daily.   POLYETHYLENE GLYCOL (MIRALAX / GLYCOLAX) PACKET    Take 17 g by mouth daily as needed.   PROTEIN (PROCEL 100) POWD    Take 2 scoop by mouth 2 (two) times daily.   SENNA-DOCUSATE (SENOKOT-S) 8.6-50 MG TABLET    Take 1 tablet by mouth 2 (two) times daily.   UNABLE TO FIND    Take 120 mLs by mouth 3 (three) times daily. Med Name: MedPass 120 mL PO TID in between meals  Modified Medications   No medications on file  Discontinued Medications   No medications on file     No Known Allergies   REVIEW OF SYSTEMS:  GENERAL: no change in appetite, no fatigue, no weight changes, no fever, chills or weakness EYES: Denies change in vision, dry eyes, eye pain, itching or discharge EARS: Denies change in hearing, ringing in ears, or earache NOSE: Denies nasal congestion or epistaxis MOUTH and THROAT: Denies oral discomfort, gingival pain or bleeding, pain from teeth or hoarseness   RESPIRATORY: no cough, SOB, DOE, wheezing, hemoptysis CARDIAC: no chest pain, edema or  palpitations GI: no abdominal pain, diarrhea, constipation, heart burn, nausea or vomiting GU: Denies dysuria, frequency, hematuria, incontinence, or discharge PSYCHIATRIC: Denies feeling of depression or anxiety. No report of hallucinations, insomnia, paranoia, or agitation   PHYSICAL EXAMINATION  GENERAL APPEARANCE: Well nourished. In no acute distress. Normal body habitus SKIN:  Skin is warm and dry. There are no suspicious lesions or rash HEAD: Normal in size and contour. No evidence of trauma EYES: Lids open and close normally. No blepharitis, entropion or ectropion. PERRL. Conjunctivae are clear and sclerae are white. Lenses are without opacity EARS: Pinnae are normal. Patient hears normal voice tunes of the examiner MOUTH and THROAT: Lips are without lesions. Oral mucosa is moist and without lesions. Tongue is normal in shape, size, and color and without lesions NECK: supple, trachea midline, no neck masses, no thyroid tenderness, no thyromegaly LYMPHATICS: no LAN in the neck, no supraclavicular LAN RESPIRATORY: breathing is even & unlabored, BS CTAB CARDIAC: RRR, no murmur,no extra heart sounds, no edema GI: abdomen soft, normal BS, no masses, no tenderness, no hepatomegaly, no splenomegaly EXTREMITIES:  Able to move X 4 extremities PSYCHIATRIC: Alert and oriented X 3. Affect and behavior are appropriate  LABS/RADIOLOGY: Labs reviewed: 05/23/15  WBC 7.4 hemoglobin 10.9 hematocrit 33.7 MCV 104.3 platelet 206 Basic Metabolic Panel:  Recent Labs  13/24/40 1138  04/27/15 0514 04/28/15 0550 04/30/15 0523 05/02/15  NA 136  < > 142 140 138 140  K 4.0  < > 3.5 3.9 4.5 4.6  CL 99  < > 108 103 101  --   CO2 31  < > 27 30 28   --   GLUCOSE 75  < > 94 125* 90  --   BUN 15  < > 13 14 15 17   CREATININE 0.66  < > 0.49 0.66 0.59 0.6  CALCIUM 9.5  < > 8.5* 8.9 8.8*  --   MG 1.9  --   --   --   --   --   < > = values in this interval not displayed. Liver Function Tests:  Recent  Labs  01/15/15 2106 01/22/15 1138 04/25/15 1430 05/02/15  AST 38 28 112* 32  ALT 32 28 143* 38*  ALKPHOS 73 77 73 107  BILITOT 1.0 0.4 0.6  --   PROT 6.3* 6.6 6.3*  --   ALBUMIN 3.3* 3.4* 2.7*  --     Recent Labs  01/12/15 0750 01/16/15 0245 04/25/15 1430  LIPASE 38 35 53*   CBC:  Recent Labs  04/17/15 1230  04/25/15 1430 04/27/15 0514 04/28/15 0550 04/30/15 0523 05/02/15  WBC 8.4  --  9.5 6.6 7.7 7.9 5.8  NEUTROABS 7.3  --  8.3*  --   --   --  4  HGB 11.8*  < > 11.8* 11.0* 12.3 12.1 10.8*  HCT 36.8  < > 37.0 33.6* 37.2 37.5 34*  MCV 103.1*  --  103.4* 102.1* 102.2* 102.5*  --   PLT 188  --  230 241 291 342 420*  < > = values in this interval not displayed.  Lipid Panel:  Recent Labs  01/22/15 1138  HDL 49.30   Cardiac Enzymes:  Recent Labs  01/16/15 0947 01/16/15 1540 04/25/15 1430  TROPONINI <0.03 <0.03 <0.03   CBG:  Recent Labs  04/25/15 1451  GLUCAP 140*      Ct Head Wo Contrast  04/25/2015  CLINICAL DATA:  Fall.  Altered mental status.  Bruises. EXAM: CT HEAD WITHOUT CONTRAST CT MAXILLOFACIAL WITHOUT CONTRAST CT CERVICAL SPINE WITHOUT CONTRAST TECHNIQUE: Multidetector CT imaging of the head, cervical spine, and maxillofacial structures were performed using the standard protocol without intravenous contrast. Multiplanar CT image reconstructions of the cervical spine and  maxillofacial structures were also generated. COMPARISON:  04/17/2015 head, cervical spine and maxillofacial CT. FINDINGS: CT HEAD FINDINGS Left frontal scalp contusion is decreased since 04/17/2015. No evidence of parenchymal hemorrhage or extra-axial fluid collection. No mass lesion, mass effect, or midline shift. No CT evidence of acute infarction. Intracranial atherosclerosis. Diffuse cerebral volume loss. Nonspecific prominent stable subcortical and periventricular white matter hypodensity, most in keeping with chronic small vessel ischemic change. Stable small right lentiform  nucleus lacunar infarct. No ventriculomegaly. There are new small air-fluid levels in the bilateral maxillary sinuses, with associated mucosal thickening in the bilateral maxillary sinuses. Partial opacification of the bilateral ethmoidal air cells. Stable tiny mucous retention cyst versus polyp in the posterior left frontal sinus. Mild mucosal thickening and inspissated secretions in the sphenoid sinus. New partial opacification of the right mastoid air cells. Left mastoid air cells are unopacified. No evidence of calvarial fracture. CT MAXILLOFACIAL FINDINGS Re- demonstration of small left frontal scalp contusion. No maxillofacial fracture. The maxilla and mandible appear intact. The nasal bones are unremarkable in appearance. There is stable levocurvature of the intact appearing nasal septum. No dislocation at the temporomandibular joints. 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 The visualized dentition demonstrates no acute abnormality. The orbits are intact bilaterally. Re- demonstration of a new small fluid levels in the bilateral maxillary sinuses with associated mild mucosal thickening in the bilateral maxillary sinuses. Partial opacification of the bilateral ethmoidal air cells. Stable tiny mucous retention cyst versus polyp in the posterior left frontal sinus. Mild mucosal thickening with mild inspissated secretions in the sphenoid sinus. No partial opacification of right mastoid air cells. Unopacified left mastoid air cells. No aggressive appearing focal osseous lesions. The parapharyngeal fat planes are preserved. The nasopharynx, oropharynx and hypopharynx are unremarkable in appearance. The parotid and submandibular glands are within normal limits. No cervical lymphadenopathy is seen. CT CERVICAL SPINE FINDINGS No fracture is detected in the cervical spine. Stable subacute to chronic moderate T3 vertebral compression fracture with approximately 40% loss of vertebral body height,  unchanged since 04/17/2015. No prevertebral soft tissue swelling. Normal cervical lordosis. Dens is well positioned between the lateral masses of C1. The lateral masses appear well-aligned. Moderate degenerative disc disease throughout the cervical spine, most prominent at C6-7. Moderate facet arthropathy in the cervical spine, asymmetric to the left. No significant cervical foraminal stenosis. No cervical spine subluxation. New partial opacification of the right mastoid air cells. Visualized left mastoid air cells appear unopacified. No evidence of intra-axial hemorrhage in the visualized brain. No gross cervical canal hematoma. Apical right upper lobe 4 mm pulmonary nodule (series 12/ image 73), stable since 04/17/2015. No cervical adenopathy or other significant neck soft tissue abnormality. IMPRESSION: 1. Small left frontal scalp contusion, decreased since 04/17/2015. No evidence of acute intracranial abnormality. No calvarial fracture. 2. No maxillofacial fracture. 3. No fracture or subluxation in the cervical spine. 4. Subacute to chronic moderate T3 vertebral compression fracture, stable. 5. Acute paranasal sinusitis, worsened. New partial opacification of the right mastoid air cells, nonspecific, favor a mild infectious or inflammatory right mastoiditis. 6. Diffuse cerebral atrophy, prominent chronic small vessel ischemic white matter change and chronic right basal ganglia lacunar infarct. Electronically Signed   By: Delbert Phenix M.D.   On: 04/25/2015 16:14   Ct Chest W Contrast  04/25/2015  CLINICAL DATA:  Status post fall last night. Pain and bruising. Initial encounter. EXAM: CT CHEST, ABDOMEN, AND PELVIS WITH CONTRAST TECHNIQUE: Multidetector CT imaging of the chest, abdomen and pelvis was  performed following the standard protocol during bolus administration of intravenous contrast. CONTRAST:  100 ML OMNIPAQUE IOHEXOL 300 MG/ML  SOLN COMPARISON:  CT abdomen and pelvis 01/12/2015. PA and lateral chest  01/12/2015. MRI abdomen 12/21/2014. FINDINGS: CT CHEST FINDINGS Mediastinum/Lymph Nodes: No evidence of posttraumatic change is identified. There is cardiomegaly. Calcific aortic and coronary atherosclerosis is noted. The patient is status post CABG. Lungs/Pleura: Trace bilateral pleural effusions are seen. There is an area of ground-glass attenuation in the anterior right upper lobe. Dependent atelectasis is noted. Musculoskeletal: Remote T3 and T12 compression fractures are identified. Vertebral body height loss of T6 has progressed since the prior PA and lateral chest films. No acute fracture is identified. CT ABDOMEN PELVIS FINDINGS Hepatobiliary: A single small gallstone is identified. No evidence of cholecystitis is seen. No biliary ductal dilatation. Pancreas: Multilocular cystic lesion in the tail the pancreas is unchanged. Spleen: Unremarkable. Adrenals/Urinary Tract: The adrenal glands appear normal. Renal cysts are unchanged. Stomach/Bowel: Surgical anastomosis in the sigmoid colon is noted. The colon is otherwise unremarkable. Stomach and small bowel appear normal. Vascular/Lymphatic: IVC filter is noted. The patient has extensive aortoiliac atherosclerosis without aneurysm. Reproductive: Status post hysterectomy. Other: None. Musculoskeletal: Right hip replacement is seen. Remote pubic ramus fractures are present bilaterally. Lower lumbar spondylosis is noted. IMPRESSION: Negative for trauma to the chest, abdomen or pelvis. Small area of ground-glass attenuation anterior right upper lobe may be due to infectious or inflammatory process. Cardiomegaly. Extensive aortic and coronary atherosclerosis. Single small gallstone without evidence of cholecystitis. No change in a small multilocular cystic lesion in the tail of the pancreas. Remote T3, T6 and T12 compression fractures. Vertebral body height loss of T6 has progressed since the prior examinations. Remote pubic ramus fractures are also noted.  Electronically Signed   By: Drusilla Kanner M.D.   On: 04/25/2015 16:03   Ct Cervical Spine Wo Contrast  04/25/2015  CLINICAL DATA:  Fall.  Altered mental status.  Bruises. EXAM: CT HEAD WITHOUT CONTRAST CT MAXILLOFACIAL WITHOUT CONTRAST CT CERVICAL SPINE WITHOUT CONTRAST TECHNIQUE: Multidetector CT imaging of the head, cervical spine, and maxillofacial structures were performed using the standard protocol without intravenous contrast. Multiplanar CT image reconstructions of the cervical spine and maxillofacial structures were also generated. COMPARISON:  04/17/2015 head, cervical spine and maxillofacial CT. FINDINGS: CT HEAD FINDINGS Left frontal scalp contusion is decreased since 04/17/2015. No evidence of parenchymal hemorrhage or extra-axial fluid collection. No mass lesion, mass effect, or midline shift. No CT evidence of acute infarction. Intracranial atherosclerosis. Diffuse cerebral volume loss. Nonspecific prominent stable subcortical and periventricular white matter hypodensity, most in keeping with chronic small vessel ischemic change. Stable small right lentiform nucleus lacunar infarct. No ventriculomegaly. There are new small air-fluid levels in the bilateral maxillary sinuses, with associated mucosal thickening in the bilateral maxillary sinuses. Partial opacification of the bilateral ethmoidal air cells. Stable tiny mucous retention cyst versus polyp in the posterior left frontal sinus. Mild mucosal thickening and inspissated secretions in the sphenoid sinus. New partial opacification of the right mastoid air cells. Left mastoid air cells are unopacified. No evidence of calvarial fracture. CT MAXILLOFACIAL FINDINGS Re- demonstration of small left frontal scalp contusion. No maxillofacial fracture. The maxilla and mandible appear intact. The nasal bones are unremarkable in appearance. There is stable levocurvature of the intact appearing nasal septum. No dislocation at the temporomandibular  joints. 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 The visualized dentition demonstrates no acute abnormality. The orbits are intact bilaterally. Re- demonstration of a  new small fluid levels in the bilateral maxillary sinuses with associated mild mucosal thickening in the bilateral maxillary sinuses. Partial opacification of the bilateral ethmoidal air cells. Stable tiny mucous retention cyst versus polyp in the posterior left frontal sinus. Mild mucosal thickening with mild inspissated secretions in the sphenoid sinus. No partial opacification of right mastoid air cells. Unopacified left mastoid air cells. No aggressive appearing focal osseous lesions. The parapharyngeal fat planes are preserved. The nasopharynx, oropharynx and hypopharynx are unremarkable in appearance. The parotid and submandibular glands are within normal limits. No cervical lymphadenopathy is seen. CT CERVICAL SPINE FINDINGS No fracture is detected in the cervical spine. Stable subacute to chronic moderate T3 vertebral compression fracture with approximately 40% loss of vertebral body height, unchanged since 04/17/2015. No prevertebral soft tissue swelling. Normal cervical lordosis. Dens is well positioned between the lateral masses of C1. The lateral masses appear well-aligned. Moderate degenerative disc disease throughout the cervical spine, most prominent at C6-7. Moderate facet arthropathy in the cervical spine, asymmetric to the left. No significant cervical foraminal stenosis. No cervical spine subluxation. New partial opacification of the right mastoid air cells. Visualized left mastoid air cells appear unopacified. No evidence of intra-axial hemorrhage in the visualized brain. No gross cervical canal hematoma. Apical right upper lobe 4 mm pulmonary nodule (series 12/ image 73), stable since 04/17/2015. No cervical adenopathy or other significant neck soft tissue abnormality. IMPRESSION: 1. Small left frontal scalp  contusion, decreased since 04/17/2015. No evidence of acute intracranial abnormality. No calvarial fracture. 2. No maxillofacial fracture. 3. No fracture or subluxation in the cervical spine. 4. Subacute to chronic moderate T3 vertebral compression fracture, stable. 5. Acute paranasal sinusitis, worsened. New partial opacification of the right mastoid air cells, nonspecific, favor a mild infectious or inflammatory right mastoiditis. 6. Diffuse cerebral atrophy, prominent chronic small vessel ischemic white matter change and chronic right basal ganglia lacunar infarct. Electronically Signed   By: Delbert Phenix M.D.   On: 04/25/2015 16:14   Ct Abdomen Pelvis W Contrast  04/25/2015  CLINICAL DATA:  Status post fall last night. Pain and bruising. Initial encounter. EXAM: CT CHEST, ABDOMEN, AND PELVIS WITH CONTRAST TECHNIQUE: Multidetector CT imaging of the chest, abdomen and pelvis was performed following the standard protocol during bolus administration of intravenous contrast. CONTRAST:  100 ML OMNIPAQUE IOHEXOL 300 MG/ML  SOLN COMPARISON:  CT abdomen and pelvis 01/12/2015. PA and lateral chest 01/12/2015. MRI abdomen 12/21/2014. FINDINGS: CT CHEST FINDINGS Mediastinum/Lymph Nodes: No evidence of posttraumatic change is identified. There is cardiomegaly. Calcific aortic and coronary atherosclerosis is noted. The patient is status post CABG. Lungs/Pleura: Trace bilateral pleural effusions are seen. There is an area of ground-glass attenuation in the anterior right upper lobe. Dependent atelectasis is noted. Musculoskeletal: Remote T3 and T12 compression fractures are identified. Vertebral body height loss of T6 has progressed since the prior PA and lateral chest films. No acute fracture is identified. CT ABDOMEN PELVIS FINDINGS Hepatobiliary: A single small gallstone is identified. No evidence of cholecystitis is seen. No biliary ductal dilatation. Pancreas: Multilocular cystic lesion in the tail the pancreas is  unchanged. Spleen: Unremarkable. Adrenals/Urinary Tract: The adrenal glands appear normal. Renal cysts are unchanged. Stomach/Bowel: Surgical anastomosis in the sigmoid colon is noted. The colon is otherwise unremarkable. Stomach and small bowel appear normal. Vascular/Lymphatic: IVC filter is noted. The patient has extensive aortoiliac atherosclerosis without aneurysm. Reproductive: Status post hysterectomy. Other: None. Musculoskeletal: Right hip replacement is seen. Remote pubic ramus fractures are present bilaterally. Lower  lumbar spondylosis is noted. IMPRESSION: Negative for trauma to the chest, abdomen or pelvis. Small area of ground-glass attenuation anterior right upper lobe may be due to infectious or inflammatory process. Cardiomegaly. Extensive aortic and coronary atherosclerosis. Single small gallstone without evidence of cholecystitis. No change in a small multilocular cystic lesion in the tail of the pancreas. Remote T3, T6 and T12 compression fractures. Vertebral body height loss of T6 has progressed since the prior examinations. Remote pubic ramus fractures are also noted. Electronically Signed   By: Drusilla Kanner M.D.   On: 04/25/2015 16:03   Ct Maxillofacial Wo Cm  04/25/2015  CLINICAL DATA:  Fall.  Altered mental status.  Bruises. EXAM: CT HEAD WITHOUT CONTRAST CT MAXILLOFACIAL WITHOUT CONTRAST CT CERVICAL SPINE WITHOUT CONTRAST TECHNIQUE: Multidetector CT imaging of the head, cervical spine, and maxillofacial structures were performed using the standard protocol without intravenous contrast. Multiplanar CT image reconstructions of the cervical spine and maxillofacial structures were also generated. COMPARISON:  04/17/2015 head, cervical spine and maxillofacial CT. FINDINGS: CT HEAD FINDINGS Left frontal scalp contusion is decreased since 04/17/2015. No evidence of parenchymal hemorrhage or extra-axial fluid collection. No mass lesion, mass effect, or midline shift. No CT evidence of acute  infarction. Intracranial atherosclerosis. Diffuse cerebral volume loss. Nonspecific prominent stable subcortical and periventricular white matter hypodensity, most in keeping with chronic small vessel ischemic change. Stable small right lentiform nucleus lacunar infarct. No ventriculomegaly. There are new small air-fluid levels in the bilateral maxillary sinuses, with associated mucosal thickening in the bilateral maxillary sinuses. Partial opacification of the bilateral ethmoidal air cells. Stable tiny mucous retention cyst versus polyp in the posterior left frontal sinus. Mild mucosal thickening and inspissated secretions in the sphenoid sinus. New partial opacification of the right mastoid air cells. Left mastoid air cells are unopacified. No evidence of calvarial fracture. CT MAXILLOFACIAL FINDINGS Re- demonstration of small left frontal scalp contusion. No maxillofacial fracture. The maxilla and mandible appear intact. The nasal bones are unremarkable in appearance. There is stable levocurvature of the intact appearing nasal septum. No dislocation at the temporomandibular joints. 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 The visualized dentition demonstrates no acute abnormality. The orbits are intact bilaterally. Re- demonstration of a new small fluid levels in the bilateral maxillary sinuses with associated mild mucosal thickening in the bilateral maxillary sinuses. Partial opacification of the bilateral ethmoidal air cells. Stable tiny mucous retention cyst versus polyp in the posterior left frontal sinus. Mild mucosal thickening with mild inspissated secretions in the sphenoid sinus. No partial opacification of right mastoid air cells. Unopacified left mastoid air cells. No aggressive appearing focal osseous lesions. The parapharyngeal fat planes are preserved. The nasopharynx, oropharynx and hypopharynx are unremarkable in appearance. The parotid and submandibular glands are within normal  limits. No cervical lymphadenopathy is seen. CT CERVICAL SPINE FINDINGS No fracture is detected in the cervical spine. Stable subacute to chronic moderate T3 vertebral compression fracture with approximately 40% loss of vertebral body height, unchanged since 04/17/2015. No prevertebral soft tissue swelling. Normal cervical lordosis. Dens is well positioned between the lateral masses of C1. The lateral masses appear well-aligned. Moderate degenerative disc disease throughout the cervical spine, most prominent at C6-7. Moderate facet arthropathy in the cervical spine, asymmetric to the left. No significant cervical foraminal stenosis. No cervical spine subluxation. New partial opacification of the right mastoid air cells. Visualized left mastoid air cells appear unopacified. No evidence of intra-axial hemorrhage in the visualized brain. No gross cervical canal hematoma. Apical right upper  lobe 4 mm pulmonary nodule (series 12/ image 73), stable since 04/17/2015. No cervical adenopathy or other significant neck soft tissue abnormality. IMPRESSION: 1. Small left frontal scalp contusion, decreased since 04/17/2015. No evidence of acute intracranial abnormality. No calvarial fracture. 2. No maxillofacial fracture. 3. No fracture or subluxation in the cervical spine. 4. Subacute to chronic moderate T3 vertebral compression fracture, stable. 5. Acute paranasal sinusitis, worsened. New partial opacification of the right mastoid air cells, nonspecific, favor a mild infectious or inflammatory right mastoiditis. 6. Diffuse cerebral atrophy, prominent chronic small vessel ischemic white matter change and chronic right basal ganglia lacunar infarct. Electronically Signed   By: Delbert Phenix M.D.   On: 04/25/2015 16:14    ASSESSMENT/PLAN:   Generalized weakness - for Home health PT, OT, CNA and ST  Atrial fibrillation - rate-controlled; continue Lopressor 25 mg twice a day, amiodarone 200 mg daily and aspirin EC 325 mg  daily  Protein calorie malnutrition, severe - albumin 2.8; recently started on Procel 2 scoops by mouth twice a day  Dementia - stable; continue Namenda 10 mg 1 tab by mouth twice a day  Hypertension - controlled; continue Norvasc 5 mg daily, lisinopril 40 mg daily and Lopressor 25 mg twice a day  Constipation -  continue senna S1 tab by mouth twice a day and MiraLAX 17 g by mouth daily when necessary  Hyperlipidemia - continued atorvastatin 40 mg daily  Anxiety - mood is stable; recently increased Valium to 2 mg 1/2 tab= 1 mg BID PRN  Osteoporosis - continue Fosamax 70 mg 1 tab by mouth every Tuesdays  Dysphagia - recently started on mechanical soft diet with thin liquids; continue follow-up with Home health to ST  Anemia of chronic disease - hgb 10.8; re-check 10.9, stable     I have filled out patient's discharge paperwork and written prescriptions.  Patient will receive home health PT, OT, ST and CNA.  DME provided:  standard wheelchair with adjustable back, anti-tippers 16" X 18", cushion and standard leg rest  Total discharge time: Greater than 30 minutes  Discharge time involved coordination of the discharge process with social worker, nursing staff and therapy department. Medical justification for home health services/DME verified.    [facility], NP [employer] [phone]

## 2015-05-27 DIAGNOSIS — Z8701 Personal history of pneumonia (recurrent): Secondary | ICD-10-CM | POA: Diagnosis not present

## 2015-05-27 DIAGNOSIS — R1310 Dysphagia, unspecified: Secondary | ICD-10-CM | POA: Diagnosis not present

## 2015-05-27 DIAGNOSIS — I4891 Unspecified atrial fibrillation: Secondary | ICD-10-CM | POA: Diagnosis not present

## 2015-05-27 DIAGNOSIS — I2510 Atherosclerotic heart disease of native coronary artery without angina pectoris: Secondary | ICD-10-CM | POA: Diagnosis not present

## 2015-05-30 ENCOUNTER — Other Ambulatory Visit: Payer: Self-pay | Admitting: Family Medicine

## 2015-05-30 NOTE — Telephone Encounter (Signed)
Medication filled to pharmacy as requested.   

## 2015-05-31 ENCOUNTER — Ambulatory Visit (INDEPENDENT_AMBULATORY_CARE_PROVIDER_SITE_OTHER): Payer: Commercial Managed Care - HMO | Admitting: Family Medicine

## 2015-05-31 ENCOUNTER — Encounter: Payer: Self-pay | Admitting: Family Medicine

## 2015-05-31 VITALS — BP 120/68 | HR 53 | Temp 98.0°F | Ht 65.0 in | Wt 105.8 lb

## 2015-05-31 DIAGNOSIS — I480 Paroxysmal atrial fibrillation: Secondary | ICD-10-CM

## 2015-05-31 DIAGNOSIS — E785 Hyperlipidemia, unspecified: Secondary | ICD-10-CM

## 2015-05-31 DIAGNOSIS — F411 Generalized anxiety disorder: Secondary | ICD-10-CM | POA: Diagnosis not present

## 2015-05-31 DIAGNOSIS — Z0000 Encounter for general adult medical examination without abnormal findings: Secondary | ICD-10-CM | POA: Diagnosis not present

## 2015-05-31 DIAGNOSIS — I10 Essential (primary) hypertension: Secondary | ICD-10-CM

## 2015-05-31 DIAGNOSIS — R296 Repeated falls: Secondary | ICD-10-CM

## 2015-05-31 DIAGNOSIS — M810 Age-related osteoporosis without current pathological fracture: Secondary | ICD-10-CM | POA: Diagnosis not present

## 2015-05-31 LAB — LIPID PANEL
Cholesterol: 93 mg/dL (ref 0–200)
HDL: 42.7 mg/dL
LDL Cholesterol: 34 mg/dL (ref 0–99)
NonHDL: 49.92
Total CHOL/HDL Ratio: 2
Triglycerides: 82 mg/dL (ref 0.0–149.0)
VLDL: 16.4 mg/dL (ref 0.0–40.0)

## 2015-05-31 LAB — CBC WITH DIFFERENTIAL/PLATELET
BASOS PCT: 0.3 % (ref 0.0–3.0)
Basophils Absolute: 0 10*3/uL (ref 0.0–0.1)
EOS ABS: 0.1 10*3/uL (ref 0.0–0.7)
Eosinophils Relative: 0.7 % (ref 0.0–5.0)
HEMATOCRIT: 37 % (ref 36.0–46.0)
HEMOGLOBIN: 12.4 g/dL (ref 12.0–15.0)
LYMPHS ABS: 0.8 10*3/uL (ref 0.7–4.0)
Lymphocytes Relative: 10.6 % — ABNORMAL LOW (ref 12.0–46.0)
MCHC: 33.5 g/dL (ref 30.0–36.0)
MCV: 101.1 fl — AB (ref 78.0–100.0)
MONO ABS: 0.5 10*3/uL (ref 0.1–1.0)
Monocytes Relative: 6.2 % (ref 3.0–12.0)
NEUTROS ABS: 6.3 10*3/uL (ref 1.4–7.7)
Neutrophils Relative %: 82.2 % — ABNORMAL HIGH (ref 43.0–77.0)
PLATELETS: 282 10*3/uL (ref 150.0–400.0)
RBC: 3.66 Mil/uL — ABNORMAL LOW (ref 3.87–5.11)
RDW: 17.6 % — AB (ref 11.5–15.5)
WBC: 7.7 10*3/uL (ref 4.0–10.5)

## 2015-05-31 LAB — COMPREHENSIVE METABOLIC PANEL
ALBUMIN: 3.5 g/dL (ref 3.5–5.2)
ALT: 25 U/L (ref 0–35)
AST: 30 U/L (ref 0–37)
Alkaline Phosphatase: 88 U/L (ref 39–117)
BUN: 21 mg/dL (ref 6–23)
CALCIUM: 9.4 mg/dL (ref 8.4–10.5)
CHLORIDE: 105 meq/L (ref 96–112)
CO2: 31 meq/L (ref 19–32)
CREATININE: 0.63 mg/dL (ref 0.40–1.20)
GFR: 95.56 mL/min (ref >60.00)
Glucose, Bld: 117 mg/dL — ABNORMAL HIGH (ref 70–99)
POTASSIUM: 4.8 meq/L (ref 3.5–5.1)
Sodium: 141 mEq/L (ref 135–145)
Total Bilirubin: 0.4 mg/dL (ref 0.2–1.2)
Total Protein: 6.7 g/dL (ref 6.0–8.3)

## 2015-05-31 MED ORDER — DIAZEPAM 2 MG PO TABS: ORAL_TABLET | ORAL | Status: DC

## 2015-05-31 NOTE — Progress Notes (Signed)
Subjective:   Jacqueline [name] is a 82 y.o. female who presents for Medicare Annual (Subsequent) preventive examination.  Pt is new to me and needs to establish with new pcp.  Family is with pt and c/o frequent falls especially when she is left alone for any period of time.    Review of Systems:   Review of Systems  Constitutional: Negative for activity change, appetite change and fatigue.  HENT: Negative for hearing loss, congestion, tinnitus and ear discharge.   Eyes: Negative for visual disturbance (see optho q1y -- vision corrected to 20/20 with glasses).  Respiratory: Negative for cough, chest tightness and shortness of breath.   Cardiovascular: Negative for chest pain, palpitations and leg swelling.  Gastrointestinal: Negative for abdominal pain, diarrhea, constipation and abdominal distention.  Genitourinary: Negative for urgency, frequency, decreased urine volume and difficulty urinating.  Musculoskeletal: Negative for back pain, arthralgias  Skin: Negative for color change, pallor and rash.  Neurological: Negative for dizziness, light-headedness, numbness and headaches. + unsteady on feet-- frequent falls Hematological: Negative for adenopathy. Does not bruise/bleed easily.  Psychiatric/Behavioral: Negative for suicidal ideas,, sleep disturbance, self-injury, dysphoric mood, decreased concentration and agitation. + anxiety + high risk for falling No abuse/ violence in home           Objective:     Vitals: BP 120/68 mmHg  Pulse 53  Temp(Src) 98 F (36.7 C) (Oral)  Ht _0  (1.651 m)  Wt 105 lb 12.8 oz (47.991 kg)  BMI 17.61 kg/m2  SpO2 97%  BP 120/68 mmHg  Pulse 53  Temp(Src) 98 F (36.7 C) (Oral)  Ht _1  (1.651 m)  Wt 105 lb 12.8 oz (47.991 kg)  BMI 17.61 kg/m2  SpO2 97% General appearance: alert, cooperative, appears stated age and no distress Head: Normocephalic, without obvious abnormality, atraumatic Eyes: negative findings: lids and lashes  normal and pupils equal, round, reactive to light and accomodation Ears: normal TM's and external ear canals both ears Nose: Nares normal. Septum midline. Mucosa normal. No drainage or sinus tenderness. Throat: lips, mucosa, and tongue normal; teeth and gums normal Neck: no adenopathy, supple, symmetrical, trachea midline and thyroid not enlarged, symmetric, no tenderness/mass/nodules Back: negative Lungs: clear to auscultation bilaterally Heart: S1, S2 normal Abdomen: soft, non-tender; bowel sounds normal; no masses,  no organomegaly Extremities: extremities normal, atraumatic, no cyanosis or edema Pulses: 2+ and symmetric Skin: Skin color, texture, turgor normal. No rashes or lesions Lymph nodes: Cervical, supraclavicular, and axillary nodes normal. Neurologic: Mental status: Alert, oriented, thought content appropriate, orientation: person, affect: flat Motor: weakness in legs unsteady on feet Tobacco History  Smoking status  . Former Smoker  . Types: Cigarettes  Smokeless tobacco  . Never Used     Counseling given: Not Answered   Past Medical History  Diagnosis Date  . Diverticulitis   . Hypertensive heart disease   . Osteoporosis   . CAD (coronary artery disease)     a. 08/2012 Abnl MV;  b. 08/2012 CABGx5: LIMA->LAD, VG->Diag, VG->OM1->OM2, VG->RCA.  Marland Kitchen Dementia   . PAF (paroxysmal atrial fibrillation) ([place])     a. Rhythm mgmt w/ amio;  b. CHA2DS2VASc = 5-->No anticoagulation 2/2 dementia/falls.  Marland Kitchen HLD (hyperlipidemia)   . GERD (gastroesophageal reflux disease)   . Dyslipidemia   . Constipation   . Anorexia   . UTI (lower urinary tract infection)   . Generalized weakness   . Compression fracture of T12 vertebra ([facility])   . Hyponatremia   .  Pressure ulcer   . Depression   . Severe muscle deconditioning   . Right knee pain   . Allergic rhinitis   . Protein calorie malnutrition ([place])   . Cerumen impaction   . Insomnia   . Hearing loss of aging   . Mild aortic  stenosis     a. 08/2012 Echo: EF 60-65%, no rwma, mild AS, mildly dil LA, PASP 26mHg.   Past Surgical History  Procedure Laterality Date  . Fracture surgery    . Partial colectomy    . Coronary artery bypass graft N/A 08/25/2012    Procedure: CORONARY ARTERY BYPASS GRAFTING ;  Surgeon: BGaye Pollack MD;  Location: [facility]  Service: Open Heart Surgery;  Laterality: N/A;  four bypasses total   . Left heart catheterization with coronary angiogram N/A 08/22/2012    Procedure: LEFT HEART CATHETERIZATION WITH CORONARY ANGIOGRAM;  Surgeon: Peter M JMartinique MD;  [facility];  Service: Cardiovascular;  Laterality: N/A;   Family History  Problem Relation Age of Onset  . Hypertension Other   . Cancer Mother     stomach  . Diabetes Neg Hx   . Heart disease Neg Hx   . Stroke Neg Hx    History  Sexual Activity  . Sexual Activity: No    No facility-administered encounter medications on file as of 05/31/2015.   Outpatient Encounter Prescriptions as of 05/31/2015  Medication Sig  . acetaminophen (TYLENOL) 325 MG tablet Take 2 tablets (650 mg total) by mouth every 6 (six) hours as needed for mild pain or headache. (Patient taking differently: Take 325 mg by mouth every 6 (six) hours as needed for mild pain or headache. )  . alendronate (FOSAMAX) 70 MG tablet TAKE 1 TABLET BY MOUTH EVERY 7 DAYS. TAKE WITH A FULL GLASS OF WATER ON AN EMPTY STOMACH  . amiodarone (PACERONE) 200 MG tablet TAKE 1 TABLET BY MOUTH DAILY.  .Marland Kitchenaspirin EC 325 MG tablet Take 325 mg by mouth every evening.   .Marland Kitchenatorvastatin (LIPITOR) 40 MG tablet TAKE 1 TABLET BY MOUTH DAILY AT 6 PM  . diazepam (VALIUM) 2 MG tablet 1/2 tab daily and then use other 1/2 as needed for high anxiety moments (Patient taking differently: Take 1 mg by mouth at bedtime. )  . folic acid (FOLVITE) 1 MG tablet TAKE 1 TABLET BY MOUTH EVERY DAY  . lisinopril (PRINIVIL,ZESTRIL) 40 MG tablet Take 1 tablet (40 mg total) by mouth daily.  .Marland KitchenLUMIGAN 0.01 %  SOLN Place 1 drop into both eyes at bedtime.  . memantine (NAMENDA) 10 MG tablet TAKE 1 TABLET (10 MG TOTAL) BY MOUTH 2 (TWO) TIMES DAILY.  . metoprolol tartrate (LOPRESSOR) 25 MG tablet Take 1 tablet by mouth 2 (two) times daily.  . polyethylene glycol (MIRALAX / GLYCOLAX) packet Take 17 g by mouth daily as needed. (Patient taking differently: Take 17 g by mouth daily as needed for mild constipation. )  . [DISCONTINUED] diazepam (VALIUM) 2 MG tablet 1/2 tab daily and then use other 1/2 as needed for high anxiety moments  . [DISCONTINUED] ILEVRO 0.3 % ophthalmic suspension INSTILL 1 DROP IN RIGHT EYE AT AFTERNOON  . [DISCONTINUED] amLODipine (NORVASC) 5 MG tablet Take 1 tablet (5 mg total) by mouth daily.  . [DISCONTINUED] BESIVANCE 0.6 % SUSP PLACE 1 DROP IN RIGHT EYE ONE TIME  A DAY  . [DISCONTINUED] Difluprednate (DUREZOL) 0.05 % EMUL Apply 1 drop to eye 3 (three) times daily. One drop in  left eye three times daily  . [DISCONTINUED] latanoprost (XALATAN) 0.005 % ophthalmic solution Place 1 drop into both eyes at bedtime.  . [DISCONTINUED] Protein (PROCEL 100) POWD Take 2 scoop by mouth 2 (two) times daily.  . [DISCONTINUED] senna-docusate (SENOKOT-S) 8.6-50 MG tablet Take 1 tablet by mouth 2 (two) times daily.  . [DISCONTINUED] UNABLE TO FIND Take 120 mLs by mouth 3 (three) times daily. Med Name: MedPass 120 mL PO TID in between meals    Activities of Daily Living In your present state of health, do you have any difficulty performing the following activities: 05/31/2015 04/25/2015  Hearing? N Y  Vision? N Y  Difficulty concentrating or making decisions? N -  Walking or climbing stairs? N Y  Dressing or bathing? N Y  Doing errands, shopping? Tempie Donning    Patient Care Team: Rosalita Chessman, DO as PCP - General (Family Medicine) Lelon Perla, MD as Attending Physician (Cardiology) Dorann Ou, MD as Consulting Physician (Ophthalmology) Lelon Perla, MD as Consulting Physician  (Cardiology) Pieter Partridge, DO as Consulting Physician (Neurology)    Assessment:    CPE Exercise Activities and Dietary recommendations=  wheelchair bound when home alone  Goals    None     Fall Risk Fall Risk  05/31/2015 04/25/2015 08/20/2014 08/17/2013  Falls in the past year? Yes Yes No Yes  Number falls in past yr: 2 or more 2 or more - 2 or more  Injury with Fall? Yes Yes - -  Risk Factor Category  High Fall Risk High Fall Risk - -  Risk for fall due to : Impaired balance/gait;Mental status change Impaired balance/gait - -  Follow up Falls evaluation completed Follow up appointment - -   Depression Screen PHQ 2/9 Scores 05/31/2015 04/25/2015 08/20/2014 08/17/2013  PHQ - 2 Score 0 0 0 0  Exception Documentation - Patient refusal - -     Cognitive Testing MMSE - Mini Mental State Exam 07/03/2014  Orientation to time 5  Orientation to Place 5  Registration 3  Attention/ Calculation 5  Recall 2  Language- name 2 objects 2  Language- repeat 1  Language- follow 3 step command 3  Language- read & follow direction 1  Write a sentence 1  Copy design 1  Total score 29    Immunization History  Administered Date(s) Administered  . Influenza,inj,Quad PF,36+ Mos 02/05/2014, 04/16/2015  . Influenza-Unspecified 04/14/2011  . PPD Test 04/30/2015, 05/14/2015  . Pneumococcal Conjugate-13 02/05/2014  . Pneumococcal-Unspecified 04/14/2011  . Tdap 04/17/2015   Screening Tests Health Maintenance  Topic Date Due  . ZOSTAVAX  01/12/2016 (Originally 11/10/1990)  . INFLUENZA VACCINE  11/12/2015  . TETANUS/TDAP  04/16/2025  . DEXA SCAN  Completed  . PNA vac Low Risk Adult  Completed      Plan:    see AVS During the course of the visit the patient was educated and counseled about the following appropriate screening and preventive services:   Vaccines to include Pneumoccal, Influenza, Hepatitis B, Td, Zostavax, HCV  Electrocardiogram  Cardiovascular Disease  Colorectal cancer  screening  Bone density screening  Diabetes screening  Glaucoma screening  Mammography/PAP  Nutrition counseling   Patient Instructions (the written plan) was given to the patient.  1. Generalized anxiety disorder D/w family--- meds put pt at high risk for fall and should not be given if being left alone - diazepam (VALIUM) 2 MG tablet; 1/2 tab daily and then use other 1/2 as needed for high  anxiety moments (Patient taking differently: Take 1 mg by mouth at bedtime. )  Dispense: 10 tablet; Refill: 0  2. Osteoporosis  - DG Bone Density; Future  3. Routine history and physical examination of adult   4. Essential hypertension con't meds per cardiology - Lipid panel - CBC with Differential/Platelet - POCT urinalysis dipstick - Comp Met (CMET)  5. Hyperlipidemia Cont lipitor for now - Lipid panel - CBC with Differential/Platelet - POCT urinalysis dipstick - Comp Met (CMET)  Garnet Koyanagi, DO  06/02/2015

## 2015-05-31 NOTE — Patient Instructions (Signed)
Preventive Care for Adults, Female A healthy lifestyle and preventive care can promote health and wellness. Preventive health guidelines for women include the following key practices.  A routine yearly physical is a good way to check with your health care provider about your health and preventive screening. It is a chance to share any concerns and updates on your health and to receive a thorough exam.  Visit your dentist for a routine exam and preventive care every 6 months. Brush your teeth twice a day and floss once a day. Good oral hygiene prevents tooth decay and gum disease.  The frequency of eye exams is based on your age, health, family medical history, use of contact lenses, and other factors. Follow your health care provider's recommendations for frequency of eye exams.  Eat a healthy diet. Foods like vegetables, fruits, whole grains, low-fat dairy products, and lean protein foods contain the nutrients you need without too many calories. Decrease your intake of foods high in solid fats, added sugars, and salt. Eat the right amount of calories for you.Get information about a proper diet from your health care provider, if necessary.  Regular physical exercise is one of the most important things you can do for your health. Most adults should get at least 150 minutes of moderate-intensity exercise (any activity that increases your heart rate and causes you to sweat) each week. In addition, most adults need muscle-strengthening exercises on 2 or more days a week.  Maintain a healthy weight. The body mass index (BMI) is a screening tool to identify possible weight problems. It provides an estimate of body fat based on height and weight. Your health care provider can find your BMI and can help you achieve or maintain a healthy weight.For adults 20 years and older:  A BMI below 18.5 is considered underweight.  A BMI of 18.5 to 24.9 is normal.  A BMI of 25 to 29.9 is considered overweight.  A  BMI of 30 and above is considered obese.  Maintain normal blood lipids and cholesterol levels by exercising and minimizing your intake of saturated fat. Eat a balanced diet with plenty of fruit and vegetables. Blood tests for lipids and cholesterol should begin at age 45 and be repeated every 5 years. If your lipid or cholesterol levels are high, you are over 50, or you are at high risk for heart disease, you may need your cholesterol levels checked more frequently.Ongoing high lipid and cholesterol levels should be treated with medicines if diet and exercise are not working.  If you smoke, find out from your health care provider how to quit. If you do not use tobacco, do not start.  Lung cancer screening is recommended for adults aged 45-80 years who are at high risk for developing lung cancer because of a history of smoking. A yearly low-dose CT scan of the lungs is recommended for people who have at least a 30-pack-year history of smoking and are a current smoker or have quit within the past 15 years. A pack year of smoking is smoking an average of 1 pack of cigarettes a day for 1 year (for example: 1 pack a day for 30 years or 2 packs a day for 15 years). Yearly screening should continue until the smoker has stopped smoking for at least 15 years. Yearly screening should be stopped for people who develop a health problem that would prevent them from having lung cancer treatment.  If you are pregnant, do not drink alcohol. If you are  breastfeeding, be very cautious about drinking alcohol. If you are not pregnant and choose to drink alcohol, do not have more than 1 drink per day. One drink is considered to be 12 ounces (355 mL) of beer, 5 ounces (148 mL) of wine, or 1.5 ounces (44 mL) of liquor.  Avoid use of street drugs. Do not share needles with anyone. Ask for help if you need support or instructions about stopping the use of drugs.  High blood pressure causes heart disease and increases the risk  of stroke. Your blood pressure should be checked at least every 1 to 2 years. Ongoing high blood pressure should be treated with medicines if weight loss and exercise do not work.  If you are 55-79 years old, ask your health care provider if you should take aspirin to prevent strokes.  Diabetes screening is done by taking a blood sample to check your blood glucose level after you have not eaten for a certain period of time (fasting). If you are not overweight and you do not have risk factors for diabetes, you should be screened once every 3 years starting at age 45. If you are overweight or obese and you are 40-70 years of age, you should be screened for diabetes every year as part of your cardiovascular risk assessment.  Breast cancer screening is essential preventive care for women. You should practice "breast self-awareness." This means understanding the normal appearance and feel of your breasts and may include breast self-examination. Any changes detected, no matter how small, should be reported to a health care provider. Women in their 20s and 30s should have a clinical breast exam (CBE) by a health care provider as part of a regular health exam every 1 to 3 years. After age 40, women should have a CBE every year. Starting at age 40, women should consider having a mammogram (breast X-ray test) every year. Women who have a family history of breast cancer should talk to their health care provider about genetic screening. Women at a high risk of breast cancer should talk to their health care providers about having an MRI and a mammogram every year.  Breast cancer gene (BRCA)-related cancer risk assessment is recommended for women who have family members with BRCA-related cancers. BRCA-related cancers include breast, ovarian, tubal, and peritoneal cancers. Having family members with these cancers may be associated with an increased risk for harmful changes (mutations) in the breast cancer genes BRCA1 and  BRCA2. Results of the assessment will determine the need for genetic counseling and BRCA1 and BRCA2 testing.  Your health care provider may recommend that you be screened regularly for cancer of the pelvic organs (ovaries, uterus, and vagina). This screening involves a pelvic examination, including checking for microscopic changes to the surface of your cervix (Pap test). You may be encouraged to have this screening done every 3 years, beginning at age 21.  For women ages 30-65, health care providers may recommend pelvic exams and Pap testing every 3 years, or they may recommend the Pap and pelvic exam, combined with testing for human papilloma virus (HPV), every 5 years. Some types of HPV increase your risk of cervical cancer. Testing for HPV may also be done on women of any age with unclear Pap test results.  Other health care providers may not recommend any screening for nonpregnant women who are considered low risk for pelvic cancer and who do not have symptoms. Ask your health care provider if a screening pelvic exam is right for   you.  If you have had past treatment for cervical cancer or a condition that could lead to cancer, you need Pap tests and screening for cancer for at least 20 years after your treatment. If Pap tests have been discontinued, your risk factors (such as having a new sexual partner) need to be reassessed to determine if screening should resume. Some women have medical problems that increase the chance of getting cervical cancer. In these cases, your health care provider may recommend more frequent screening and Pap tests.  Colorectal cancer can be detected and often prevented. Most routine colorectal cancer screening begins at the age of 50 years and continues through age 75 years. However, your health care provider may recommend screening at an earlier age if you have risk factors for colon cancer. On a yearly basis, your health care provider may provide home test kits to check  for hidden blood in the stool. Use of a small camera at the end of a tube, to directly examine the colon (sigmoidoscopy or colonoscopy), can detect the earliest forms of colorectal cancer. Talk to your health care provider about this at age 50, when routine screening begins. Direct exam of the colon should be repeated every 5-10 years through age 75 years, unless early forms of precancerous polyps or small growths are found.  People who are at an increased risk for hepatitis B should be screened for this virus. You are considered at high risk for hepatitis B if:  You were born in a country where hepatitis B occurs often. Talk with your health care provider about which countries are considered high risk.  Your parents were born in a high-risk country and you have not received a shot to protect against hepatitis B (hepatitis B vaccine).  You have HIV or AIDS.  You use needles to inject street drugs.  You live with, or have sex with, someone who has hepatitis B.  You get hemodialysis treatment.  You take certain medicines for conditions like cancer, organ transplantation, and autoimmune conditions.  Hepatitis C blood testing is recommended for all people born from 1945 through 1965 and any individual with known risks for hepatitis C.  Practice safe sex. Use condoms and avoid high-risk sexual practices to reduce the spread of sexually transmitted infections (STIs). STIs include gonorrhea, chlamydia, syphilis, trichomonas, herpes, HPV, and human immunodeficiency virus (HIV). Herpes, HIV, and HPV are viral illnesses that have no cure. They can result in disability, cancer, and death.  You should be screened for sexually transmitted illnesses (STIs) including gonorrhea and chlamydia if:  You are sexually active and are younger than 24 years.  You are older than 24 years and your health care provider tells you that you are at risk for this type of infection.  Your sexual activity has changed  since you were last screened and you are at an increased risk for chlamydia or gonorrhea. Ask your health care provider if you are at risk.  If you are at risk of being infected with HIV, it is recommended that you take a prescription medicine daily to prevent HIV infection. This is called preexposure prophylaxis (PrEP). You are considered at risk if:  You are sexually active and do not regularly use condoms or know the HIV status of your partner(s).  You take drugs by injection.  You are sexually active with a partner who has HIV.  Talk with your health care provider about whether you are at high risk of being infected with HIV. If   you choose to begin PrEP, you should first be tested for HIV. You should then be tested every 3 months for as long as you are taking PrEP.  Osteoporosis is a disease in which the bones lose minerals and strength with aging. This can result in serious bone fractures or breaks. The risk of osteoporosis can be identified using a bone density scan. Women ages 67 years and over and women at risk for fractures or osteoporosis should discuss screening with their health care providers. Ask your health care provider whether you should take a calcium supplement or vitamin D to reduce the rate of osteoporosis.  Menopause can be associated with physical symptoms and risks. Hormone replacement therapy is available to decrease symptoms and risks. You should talk to your health care provider about whether hormone replacement therapy is right for you.  Use sunscreen. Apply sunscreen liberally and repeatedly throughout the day. You should seek shade when your shadow is shorter than you. Protect yourself by wearing long sleeves, pants, a wide-brimmed hat, and sunglasses year round, whenever you are outdoors.  Once a month, do a whole body skin exam, using a mirror to look at the skin on your back. Tell your health care provider of new moles, moles that have irregular borders, moles that  are larger than a pencil eraser, or moles that have changed in shape or color.  Stay current with required vaccines (immunizations).  Influenza vaccine. All adults should be immunized every year.  Tetanus, diphtheria, and acellular pertussis (Td, Tdap) vaccine. Pregnant women should receive 1 dose of Tdap vaccine during each pregnancy. The dose should be obtained regardless of the length of time since the last dose. Immunization is preferred during the 27th-36th week of gestation. An adult who has not previously received Tdap or who does not know her vaccine status should receive 1 dose of Tdap. This initial dose should be followed by tetanus and diphtheria toxoids (Td) booster doses every 10 years. Adults with an unknown or incomplete history of completing a 3-dose immunization series with Td-containing vaccines should begin or complete a primary immunization series including a Tdap dose. Adults should receive a Td booster every 10 years.  Varicella vaccine. An adult without evidence of immunity to varicella should receive 2 doses or a second dose if she has previously received 1 dose. Pregnant females who do not have evidence of immunity should receive the first dose after pregnancy. This first dose should be obtained before leaving the health care facility. The second dose should be obtained 4-8 weeks after the first dose.  Human papillomavirus (HPV) vaccine. Females aged 13-26 years who have not received the vaccine previously should obtain the 3-dose series. The vaccine is not recommended for use in pregnant females. However, pregnancy testing is not needed before receiving a dose. If a female is found to be pregnant after receiving a dose, no treatment is needed. In that case, the remaining doses should be delayed until after the pregnancy. Immunization is recommended for any person with an immunocompromised condition through the age of 61 years if she did not get any or all doses earlier. During the  3-dose series, the second dose should be obtained 4-8 weeks after the first dose. The third dose should be obtained 24 weeks after the first dose and 16 weeks after the second dose.  Zoster vaccine. One dose is recommended for adults aged 30 years or older unless certain conditions are present.  Measles, mumps, and rubella (MMR) vaccine. Adults born  before 1957 generally are considered immune to measles and mumps. Adults born in 1957 or later should have 1 or more doses of MMR vaccine unless there is a contraindication to the vaccine or there is laboratory evidence of immunity to each of the three diseases. A routine second dose of MMR vaccine should be obtained at least 28 days after the first dose for students attending postsecondary schools, health care workers, or international travelers. People who received inactivated measles vaccine or an unknown type of measles vaccine during 1963-1967 should receive 2 doses of MMR vaccine. People who received inactivated mumps vaccine or an unknown type of mumps vaccine before 1979 and are at high risk for mumps infection should consider immunization with 2 doses of MMR vaccine. For females of childbearing age, rubella immunity should be determined. If there is no evidence of immunity, females who are not pregnant should be vaccinated. If there is no evidence of immunity, females who are pregnant should delay immunization until after pregnancy. Unvaccinated health care workers born before 1957 who lack laboratory evidence of measles, mumps, or rubella immunity or laboratory confirmation of disease should consider measles and mumps immunization with 2 doses of MMR vaccine or rubella immunization with 1 dose of MMR vaccine.  Pneumococcal 13-valent conjugate (PCV13) vaccine. When indicated, a person who is uncertain of his immunization history and has no record of immunization should receive the PCV13 vaccine. All adults 65 years of age and older should receive this  vaccine. An adult aged 19 years or older who has certain medical conditions and has not been previously immunized should receive 1 dose of PCV13 vaccine. This PCV13 should be followed with a dose of pneumococcal polysaccharide (PPSV23) vaccine. Adults who are at high risk for pneumococcal disease should obtain the PPSV23 vaccine at least 8 weeks after the dose of PCV13 vaccine. Adults older than 80 years of age who have normal immune system function should obtain the PPSV23 vaccine dose at least 1 year after the dose of PCV13 vaccine.  Pneumococcal polysaccharide (PPSV23) vaccine. When PCV13 is also indicated, PCV13 should be obtained first. All adults aged 65 years and older should be immunized. An adult younger than age 65 years who has certain medical conditions should be immunized. Any person who resides in a nursing home or long-term care facility should be immunized. An adult smoker should be immunized. People with an immunocompromised condition and certain other conditions should receive both PCV13 and PPSV23 vaccines. People with human immunodeficiency virus (HIV) infection should be immunized as soon as possible after diagnosis. Immunization during chemotherapy or radiation therapy should be avoided. Routine use of PPSV23 vaccine is not recommended for American Indians, [place] Natives, or people younger than 65 years unless there are medical conditions that require PPSV23 vaccine. When indicated, people who have unknown immunization and have no record of immunization should receive PPSV23 vaccine. One-time revaccination 5 years after the first dose of PPSV23 is recommended for people aged 19-64 years who have chronic kidney failure, nephrotic syndrome, asplenia, or immunocompromised conditions. People who received 1-2 doses of PPSV23 before age 65 years should receive another dose of PPSV23 vaccine at age 65 years or later if at least 5 years have passed since the previous dose. Doses of PPSV23 are not  needed for people immunized with PPSV23 at or after age 65 years.  Meningococcal vaccine. Adults with asplenia or persistent complement component deficiencies should receive 2 doses of quadrivalent meningococcal conjugate (MenACWY-D) vaccine. The doses should be obtained   at least 2 months apart. Microbiologists working with certain meningococcal bacteria, [place] recruits, people at risk during an outbreak, and people who travel to or live in countries with a high rate of meningitis should be immunized. A first-year college student up through age 34 years who is living in a residence hall should receive a dose if she did not receive a dose on or after her 16th birthday. Adults who have certain high-risk conditions should receive one or more doses of vaccine.  Hepatitis A vaccine. Adults who wish to be protected from this disease, have certain high-risk conditions, work with hepatitis A-infected animals, work in hepatitis A research labs, or travel to or work in countries with a high rate of hepatitis A should be immunized. Adults who were previously unvaccinated and who anticipate close contact with an international adoptee during the first 60 days after arrival in the [place] from a country with a high rate of hepatitis A should be immunized.  Hepatitis B vaccine. Adults who wish to be protected from this disease, have certain high-risk conditions, may be exposed to blood or other infectious body fluids, are household contacts or sex partners of hepatitis B positive people, are clients or workers in certain care facilities, or travel to or work in countries with a high rate of hepatitis B should be immunized.  Haemophilus influenzae type b (Hib) vaccine. A previously unvaccinated person with asplenia or sickle cell disease or having a scheduled splenectomy should receive 1 dose of Hib vaccine. Regardless of previous immunization, a recipient of a hematopoietic stem cell transplant should receive a  3-dose series 6-12 months after her successful transplant. Hib vaccine is not recommended for adults with HIV infection. Preventive Services / Frequency Ages 35 to 4 years  Blood pressure check.** / Every 3-5 years.  Lipid and cholesterol check.** / Every 5 years beginning at age 60.  Clinical breast exam.** / Every 3 years for women in their 71s and 10s.  BRCA-related cancer risk assessment.** / For women who have family members with a BRCA-related cancer (breast, ovarian, tubal, or peritoneal cancers).  Pap test.** / Every 2 years from ages 76 through 26. Every 3 years starting at age 61 through age 76 or 93 with a history of 3 consecutive normal Pap tests.  HPV screening.** / Every 3 years from ages 37 through ages 60 to 51 with a history of 3 consecutive normal Pap tests.  Hepatitis C blood test.** / For any individual with known risks for hepatitis C.  Skin self-exam. / Monthly.  Influenza vaccine. / Every year.  Tetanus, diphtheria, and acellular pertussis (Tdap, Td) vaccine.** / Consult your health care provider. Pregnant women should receive 1 dose of Tdap vaccine during each pregnancy. 1 dose of Td every 10 years.  Varicella vaccine.** / Consult your health care provider. Pregnant females who do not have evidence of immunity should receive the first dose after pregnancy.  HPV vaccine. / 3 doses over 6 months, if 93 and younger. The vaccine is not recommended for use in pregnant females. However, pregnancy testing is not needed before receiving a dose.  Measles, mumps, rubella (MMR) vaccine.** / You need at least 1 dose of MMR if you were born in 1957 or later. You may also need a 2nd dose. For females of childbearing age, rubella immunity should be determined. If there is no evidence of immunity, females who are not pregnant should be vaccinated. If there is no evidence of immunity, females who are  pregnant should delay immunization until after pregnancy.  Pneumococcal  13-valent conjugate (PCV13) vaccine.** / Consult your health care provider.  Pneumococcal polysaccharide (PPSV23) vaccine.** / 1 to 2 doses if you smoke cigarettes or if you have certain conditions.  Meningococcal vaccine.** / 1 dose if you are age 68 to 8 years and a Market researcher living in a residence hall, or have one of several medical conditions, you need to get vaccinated against meningococcal disease. You may also need additional booster doses.  Hepatitis A vaccine.** / Consult your health care provider.  Hepatitis B vaccine.** / Consult your health care provider.  Haemophilus influenzae type b (Hib) vaccine.** / Consult your health care provider. Ages 7 to 53 years  Blood pressure check.** / Every year.  Lipid and cholesterol check.** / Every 5 years beginning at age 25 years.  Lung cancer screening. / Every year if you are aged 11-80 years and have a 30-pack-year history of smoking and currently smoke or have quit within the past 15 years. Yearly screening is stopped once you have quit smoking for at least 15 years or develop a health problem that would prevent you from having lung cancer treatment.  Clinical breast exam.** / Every year after age 48 years.  BRCA-related cancer risk assessment.** / For women who have family members with a BRCA-related cancer (breast, ovarian, tubal, or peritoneal cancers).  Mammogram.** / Every year beginning at age 41 years and continuing for as long as you are in good health. Consult with your health care provider.  Pap test.** / Every 3 years starting at age 65 years through age 37 or 70 years with a history of 3 consecutive normal Pap tests.  HPV screening.** / Every 3 years from ages 72 years through ages 60 to 40 years with a history of 3 consecutive normal Pap tests.  Fecal occult blood test (FOBT) of stool. / Every year beginning at age 21 years and continuing until age 5 years. You may not need to do this test if you get  a colonoscopy every 10 years.  Flexible sigmoidoscopy or colonoscopy.** / Every 5 years for a flexible sigmoidoscopy or every 10 years for a colonoscopy beginning at age 35 years and continuing until age 48 years.  Hepatitis C blood test.** / For all people born from [birth year] through 1965 and any individual with known risks for hepatitis C.  Skin self-exam. / Monthly.  Influenza vaccine. / Every year.  Tetanus, diphtheria, and acellular pertussis (Tdap/Td) vaccine.** / Consult your health care provider. Pregnant women should receive 1 dose of Tdap vaccine during each pregnancy. 1 dose of Td every 10 years.  Varicella vaccine.** / Consult your health care provider. Pregnant females who do not have evidence of immunity should receive the first dose after pregnancy.  Zoster vaccine.** / 1 dose for adults aged 30 years or older.  Measles, mumps, rubella (MMR) vaccine.** / You need at least 1 dose of MMR if you were born in 1957 or later. You may also need a second dose. For females of childbearing age, rubella immunity should be determined. If there is no evidence of immunity, females who are not pregnant should be vaccinated. If there is no evidence of immunity, females who are pregnant should delay immunization until after pregnancy.  Pneumococcal 13-valent conjugate (PCV13) vaccine.** / Consult your health care provider.  Pneumococcal polysaccharide (PPSV23) vaccine.** / 1 to 2 doses if you smoke cigarettes or if you have certain conditions.  Meningococcal vaccine.** /  Consult your health care provider.  Hepatitis A vaccine.** / Consult your health care provider.  Hepatitis B vaccine.** / Consult your health care provider.  Haemophilus influenzae type b (Hib) vaccine.** / Consult your health care provider. Ages 64 years and over  Blood pressure check.** / Every year.  Lipid and cholesterol check.** / Every 5 years beginning at age 23 years.  Lung cancer screening. / Every year if you  are aged 16-80 years and have a 30-pack-year history of smoking and currently smoke or have quit within the past 15 years. Yearly screening is stopped once you have quit smoking for at least 15 years or develop a health problem that would prevent you from having lung cancer treatment.  Clinical breast exam.** / Every year after age 74 years.  BRCA-related cancer risk assessment.** / For women who have family members with a BRCA-related cancer (breast, ovarian, tubal, or peritoneal cancers).  Mammogram.** / Every year beginning at age 44 years and continuing for as long as you are in good health. Consult with your health care provider.  Pap test.** / Every 3 years starting at age 58 years through age 22 or 39 years with 3 consecutive normal Pap tests. Testing can be stopped between 65 and 70 years with 3 consecutive normal Pap tests and no abnormal Pap or HPV tests in the past 10 years.  HPV screening.** / Every 3 years from ages 64 years through ages 70 or 61 years with a history of 3 consecutive normal Pap tests. Testing can be stopped between 65 and 70 years with 3 consecutive normal Pap tests and no abnormal Pap or HPV tests in the past 10 years.  Fecal occult blood test (FOBT) of stool. / Every year beginning at age 40 years and continuing until age 27 years. You may not need to do this test if you get a colonoscopy every 10 years.  Flexible sigmoidoscopy or colonoscopy.** / Every 5 years for a flexible sigmoidoscopy or every 10 years for a colonoscopy beginning at age 7 years and continuing until age 32 years.  Hepatitis C blood test.** / For all people born from [birth year] through 1963 and any individual with known risks for hepatitis C.  Osteoporosis screening.** / A one-time screening for women ages 30 years and over and women at risk for fractures or osteoporosis.  Skin self-exam. / Monthly.  Influenza vaccine. / Every year.  Tetanus, diphtheria, and acellular pertussis (Tdap/Td)  vaccine.** / 1 dose of Td every 10 years.  Varicella vaccine.** / Consult your health care provider.  Zoster vaccine.** / 1 dose for adults aged 35 years or older.  Pneumococcal 13-valent conjugate (PCV13) vaccine.** / Consult your health care provider.  Pneumococcal polysaccharide (PPSV23) vaccine.** / 1 dose for all adults aged 46 years and older.  Meningococcal vaccine.** / Consult your health care provider.  Hepatitis A vaccine.** / Consult your health care provider.  Hepatitis B vaccine.** / Consult your health care provider.  Haemophilus influenzae type b (Hib) vaccine.** / Consult your health care provider. ** Family history and personal history of risk and conditions may change your health care provider's recommendations.   This information is not intended to replace advice given to you by your health care provider. Make sure you discuss any questions you have with your health care provider.   Document Released: 05/26/2001 Document Revised: 04/20/2014 Document Reviewed: 08/25/2010 Elsevier Interactive Patient Education [employer].

## 2015-06-01 ENCOUNTER — Encounter (HOSPITAL_COMMUNITY): Payer: Self-pay | Admitting: *Deleted

## 2015-06-01 ENCOUNTER — Inpatient Hospital Stay (HOSPITAL_COMMUNITY)
Admission: EM | Admit: 2015-06-01 | Discharge: 2015-06-06 | DRG: 480 | Disposition: A | Discharge status: Skilled Nursing Facility | Payer: Commercial Managed Care - HMO | Attending: Internal Medicine | Admitting: Internal Medicine

## 2015-06-01 DIAGNOSIS — I350 Nonrheumatic aortic (valve) stenosis: Secondary | ICD-10-CM

## 2015-06-01 DIAGNOSIS — D62 Acute posthemorrhagic anemia: Secondary | ICD-10-CM | POA: Diagnosis not present

## 2015-06-01 DIAGNOSIS — E43 Unspecified severe protein-calorie malnutrition: Secondary | ICD-10-CM | POA: Insufficient documentation

## 2015-06-01 DIAGNOSIS — S72142A Displaced intertrochanteric fracture of left femur, initial encounter for closed fracture: Secondary | ICD-10-CM | POA: Diagnosis not present

## 2015-06-01 DIAGNOSIS — I4891 Unspecified atrial fibrillation: Secondary | ICD-10-CM

## 2015-06-01 DIAGNOSIS — Z7982 Long term (current) use of aspirin: Secondary | ICD-10-CM

## 2015-06-01 DIAGNOSIS — I480 Paroxysmal atrial fibrillation: Secondary | ICD-10-CM | POA: Diagnosis present

## 2015-06-01 DIAGNOSIS — E785 Hyperlipidemia, unspecified: Secondary | ICD-10-CM | POA: Diagnosis present

## 2015-06-01 DIAGNOSIS — Z66 Do not resuscitate: Secondary | ICD-10-CM | POA: Diagnosis present

## 2015-06-01 DIAGNOSIS — M25552 Pain in left hip: Secondary | ICD-10-CM | POA: Diagnosis not present

## 2015-06-01 DIAGNOSIS — W1830XA Fall on same level, unspecified, initial encounter: Secondary | ICD-10-CM | POA: Diagnosis present

## 2015-06-01 DIAGNOSIS — F411 Generalized anxiety disorder: Secondary | ICD-10-CM | POA: Diagnosis present

## 2015-06-01 DIAGNOSIS — I2510 Atherosclerotic heart disease of native coronary artery without angina pectoris: Secondary | ICD-10-CM | POA: Diagnosis present

## 2015-06-01 DIAGNOSIS — F0390 Unspecified dementia without behavioral disturbance: Secondary | ICD-10-CM | POA: Diagnosis present

## 2015-06-01 DIAGNOSIS — Y92009 Unspecified place in unspecified non-institutional (private) residence as the place of occurrence of the external cause: Secondary | ICD-10-CM

## 2015-06-01 DIAGNOSIS — M810 Age-related osteoporosis without current pathological fracture: Secondary | ICD-10-CM | POA: Diagnosis present

## 2015-06-01 DIAGNOSIS — Z419 Encounter for procedure for purposes other than remedying health state, unspecified: Secondary | ICD-10-CM | POA: Diagnosis present

## 2015-06-01 DIAGNOSIS — I119 Hypertensive heart disease without heart failure: Secondary | ICD-10-CM | POA: Diagnosis present

## 2015-06-01 DIAGNOSIS — E46 Unspecified protein-calorie malnutrition: Secondary | ICD-10-CM | POA: Diagnosis present

## 2015-06-01 DIAGNOSIS — Z8249 Family history of ischemic heart disease and other diseases of the circulatory system: Secondary | ICD-10-CM

## 2015-06-01 DIAGNOSIS — Z87891 Personal history of nicotine dependence: Secondary | ICD-10-CM

## 2015-06-01 DIAGNOSIS — R55 Syncope and collapse: Secondary | ICD-10-CM | POA: Diagnosis present

## 2015-06-01 DIAGNOSIS — Z7983 Long term (current) use of bisphosphonates: Secondary | ICD-10-CM

## 2015-06-01 DIAGNOSIS — Z951 Presence of aortocoronary bypass graft: Secondary | ICD-10-CM

## 2015-06-01 DIAGNOSIS — R32 Unspecified urinary incontinence: Secondary | ICD-10-CM | POA: Diagnosis not present

## 2015-06-01 DIAGNOSIS — S72009A Fracture of unspecified part of neck of unspecified femur, initial encounter for closed fracture: Secondary | ICD-10-CM | POA: Diagnosis present

## 2015-06-01 DIAGNOSIS — Z96641 Presence of right artificial hip joint: Secondary | ICD-10-CM | POA: Diagnosis present

## 2015-06-01 DIAGNOSIS — I10 Essential (primary) hypertension: Secondary | ICD-10-CM | POA: Diagnosis present

## 2015-06-01 DIAGNOSIS — R339 Retention of urine, unspecified: Secondary | ICD-10-CM | POA: Diagnosis not present

## 2015-06-01 DIAGNOSIS — S72002A Fracture of unspecified part of neck of left femur, initial encounter for closed fracture: Secondary | ICD-10-CM | POA: Diagnosis present

## 2015-06-01 DIAGNOSIS — Z79899 Other long term (current) drug therapy: Secondary | ICD-10-CM

## 2015-06-01 DIAGNOSIS — W19XXXA Unspecified fall, initial encounter: Secondary | ICD-10-CM

## 2015-06-01 DIAGNOSIS — Z681 Body mass index (BMI) 19 or less, adult: Secondary | ICD-10-CM

## 2015-06-01 DIAGNOSIS — R197 Diarrhea, unspecified: Secondary | ICD-10-CM

## 2015-06-01 DIAGNOSIS — H9190 Unspecified hearing loss, unspecified ear: Secondary | ICD-10-CM | POA: Diagnosis present

## 2015-06-01 DIAGNOSIS — Z9181 History of falling: Secondary | ICD-10-CM

## 2015-06-01 DIAGNOSIS — K219 Gastro-esophageal reflux disease without esophagitis: Secondary | ICD-10-CM | POA: Diagnosis present

## 2015-06-01 HISTORY — DX: Nonrheumatic aortic (valve) stenosis: I35.0

## 2015-06-01 HISTORY — DX: Hypertensive heart disease without heart failure: I11.9

## 2015-06-01 LAB — COMPREHENSIVE METABOLIC PANEL
ALBUMIN: 3.2 g/dL — AB (ref 3.5–5.0)
ALK PHOS: 98 U/L (ref 38–126)
ALT: 38 U/L (ref 14–54)
AST: 52 U/L — AB (ref 15–41)
Anion gap: 11 (ref 5–15)
BUN: 26 mg/dL — ABNORMAL HIGH (ref 6–20)
CALCIUM: 10 mg/dL (ref 8.9–10.3)
CHLORIDE: 103 mmol/L (ref 101–111)
CO2: 27 mmol/L (ref 22–32)
CREATININE: 0.86 mg/dL (ref 0.44–1.00)
GFR calc non Af Amer: >60 mL/min (ref >60)
GLUCOSE: 175 mg/dL — AB (ref 65–99)
Potassium: 4.9 mmol/L (ref 3.5–5.1)
SODIUM: 141 mmol/L (ref 135–145)
Total Bilirubin: 0.6 mg/dL (ref 0.3–1.2)
Total Protein: 6.5 g/dL (ref 6.5–8.1)

## 2015-06-01 LAB — CBC WITH DIFFERENTIAL/PLATELET
BASOS ABS: 0 10*3/uL (ref 0.0–0.1)
BASOS PCT: 0 %
EOS ABS: 0.1 10*3/uL (ref 0.0–0.7)
EOS PCT: 1 %
HCT: 38.2 % (ref 36.0–46.0)
HEMOGLOBIN: 12.1 g/dL (ref 12.0–15.0)
LYMPHS ABS: 1.2 10*3/uL (ref 0.7–4.0)
Lymphocytes Relative: 13 %
MCH: 33.4 pg (ref 26.0–34.0)
MCHC: 31.7 g/dL (ref 30.0–36.0)
MCV: 105.5 fL — ABNORMAL HIGH (ref 78.0–100.0)
Monocytes Absolute: 0.4 10*3/uL (ref 0.1–1.0)
Monocytes Relative: 5 %
NEUTROS PCT: 81 %
Neutro Abs: 7 10*3/uL (ref 1.7–7.7)
PLATELETS: 269 10*3/uL (ref 150–400)
RBC: 3.62 MIL/uL — AB (ref 3.87–5.11)
RDW: 16.8 % — ABNORMAL HIGH (ref 11.5–15.5)
WBC: 8.7 10*3/uL (ref 4.0–10.5)

## 2015-06-01 MED ORDER — MORPHINE SULFATE (PF) 4 MG/ML IV SOLN
6.0000 mg | Freq: Once | INTRAVENOUS | Status: AC
  Administered 2015-06-01: 6 mg via INTRAVENOUS
  Filled 2015-06-01: qty 2

## 2015-06-01 MED ORDER — ONDANSETRON HCL 4 MG/2ML IJ SOLN
4.0000 mg | Freq: Once | INTRAMUSCULAR | Status: AC
  Administered 2015-06-01: 4 mg via INTRAVENOUS
  Filled 2015-06-01: qty 2

## 2015-06-01 NOTE — ED Notes (Signed)
Patient transported to X-ray 

## 2015-06-01 NOTE — ED Provider Notes (Signed)
MSE was initiated and I personally evaluated the patient and placed orders (if any) at  11:05 PM on June 01, 2015.  The patient appears stable so that the remainder of the MSE may be completed by another provider.  Likely right hip fracture given shortening Rotation.  Pain will be treated this time.  Patient be taken back for x-rays and standard blood work and preop testing  Azalia Bilis, MD 06/01/15 [phone]

## 2015-06-01 NOTE — ED Notes (Signed)
Patient presents after a fall at home.  C/o left knee pain and left leg shortened and rotated  States she cannot move her leg   Has been having frequent falls

## 2015-06-01 NOTE — ED Provider Notes (Signed)
CSN: [id number]     Arrival date & time 06/01/15  2147 History  By signing my name below, I, Jacqueline [name], attest that this documentation has been prepared under the direction and in the presence of Jacqueline Kaplan, MD. Electronically Signed: Bethel [name], ED Scribe. 06/02/2015. 2:13 AM   Chief Complaint  Patient presents with  . Fall  . Hip Injury  . Knee Injury    The history is provided by the patient. No language interpreter was used.   Jacqueline [name] is a 82 y.o. female who presents to the Emergency Department complaining of a fall at home 1.5 hours ago. Pt states that she became dizzy and lost balance.   Associated symptoms include left knee pain. Pt states that she wants to straighten the left leg but cannot. No head injury or LOC.She typically uses a walker to ambulate and notes that she has been having frequent falls.  She is not on a blood thinner.  Past Medical History  Diagnosis Date  . Diverticulitis   . Hypertension   . Osteoporosis   . CAD (coronary artery disease)   . Dementia   . PAF (paroxysmal atrial fibrillation) (HCC)   . HLD (hyperlipidemia)   . GERD (gastroesophageal reflux disease)   . Community acquired pneumonia   . Dyslipidemia   . Constipation   . Anorexia   . UTI (lower urinary tract infection)   . Generalized weakness   . Compression fracture of T12 vertebra (HCC)   . Hyponatremia   . Pressure ulcer   . Depression   . Severe muscle deconditioning   . Right knee pain   . Allergic rhinitis   . Protein calorie malnutrition (HCC)   . SOB (shortness of breath)   . Cerumen impaction   . Insomnia   . Hearing loss of aging    Past Surgical History  Procedure Laterality Date  . Fracture surgery    . Partial colectomy    . Coronary artery bypass graft N/A 08/25/2012    Procedure: CORONARY ARTERY BYPASS GRAFTING ;  Surgeon: Alleen Borne, MD;  Location: [facility] OR;  Service: Open Heart Surgery;  Laterality: N/A;  four bypasses total   .  Left heart catheterization with coronary angiogram N/A 08/22/2012    Procedure: LEFT HEART CATHETERIZATION WITH CORONARY ANGIOGRAM;  Surgeon: Peter M Swaziland, MD;  Location: [facility] CATH LAB;  Service: Cardiovascular;  Laterality: N/A;   Family History  Problem Relation Age of Onset  . Hypertension Other   . Cancer Mother     stomach  . Diabetes Neg Hx   . Heart disease Neg Hx   . Stroke Neg Hx    Social History  Substance Use Topics  . Smoking status: Former Smoker    Types: Cigarettes  . Smokeless tobacco: Never Used  . Alcohol Use: No     Comment: Occasional   OB History    No data available     Review of Systems  A complete 10 system review of systems was obtained and all systems are negative except as noted in the HPI and PMH.    Allergies  Review of patient's allergies indicates no known allergies.  Home Medications   Prior to Admission medications   Medication Sig Start Date End Date Taking? Authorizing Provider  acetaminophen (TYLENOL) 325 MG tablet Take 2 tablets (650 mg total) by mouth every 6 (six) hours as needed for mild pain or headache. Patient taking differently: Take 325 mg by mouth  every 6 (six) hours as needed for mild pain or headache.  04/30/15  Yes Zannie Cove, MD  alendronate (FOSAMAX) 70 MG tablet TAKE 1 TABLET BY MOUTH EVERY 7 DAYS. TAKE WITH A FULL GLASS OF WATER ON AN EMPTY STOMACH 02/25/15  Yes Sheliah Hatch, MD  amiodarone (PACERONE) 200 MG tablet TAKE 1 TABLET BY MOUTH DAILY. 05/10/15  Yes Sheliah Hatch, MD  aspirin EC 325 MG tablet Take 325 mg by mouth every evening.    Yes Historical Provider, MD  atorvastatin (LIPITOR) 40 MG tablet TAKE 1 TABLET BY MOUTH DAILY AT 6 PM 05/30/15  Yes Sheliah Hatch, MD  diazepam (VALIUM) 2 MG tablet 1/2 tab daily and then use other 1/2 as needed for high anxiety moments Patient taking differently: Take 1 mg by mouth at bedtime.  05/31/15  Yes Lelon Perla, DO  folic acid (FOLVITE) 1 MG tablet TAKE 1  TABLET BY MOUTH EVERY DAY 04/16/15  Yes Sheliah Hatch, MD  lisinopril (PRINIVIL,ZESTRIL) 40 MG tablet Take 1 tablet (40 mg total) by mouth daily. 09/11/14  Yes Lewayne Bunting, MD  LUMIGAN 0.01 % SOLN Place 1 drop into both eyes at bedtime. 05/15/15  Yes Historical Provider, MD  memantine (NAMENDA) 10 MG tablet TAKE 1 TABLET (10 MG TOTAL) BY MOUTH 2 (TWO) TIMES DAILY. 02/07/15  Yes Adam Mliss Fritz, DO  metoprolol tartrate (LOPRESSOR) 25 MG tablet Take 1 tablet by mouth 2 (two) times daily. 03/04/15  Yes Historical Provider, MD  Multiple Vitamin (MULTIVITAMIN WITH MINERALS) TABS tablet Take 0.5 tablets by mouth 2 (two) times daily.   Yes Historical Provider, MD  polyethylene glycol (MIRALAX / GLYCOLAX) packet Take 17 g by mouth daily as needed. Patient taking differently: Take 17 g by mouth daily as needed for mild constipation.  01/17/15  Yes Saima Rizwan, MD   BP 140/73 mmHg  Pulse 53  Temp(Src) 97.7 F (36.5 C) (Oral)  Resp 18  Wt 105 lb (47.628 kg)  SpO2 100% Physical Exam  Constitutional: She is oriented to person, place, and time. She appears well-developed and well-nourished. No distress.  HENT:  Head: Normocephalic.  Pt has bilateral infraorbital ecchymosis but no other signs of injury or bleeding  Eyes: EOM are normal.  Neck: Normal range of motion.  Cardiovascular: Normal rate and regular rhythm.   Murmur heard.  Systolic murmur is present  Pulses:      Dorsalis pedis pulses are 1+ on the left side.  Pulmonary/Chest: Effort normal and breath sounds normal.  Abdominal: Soft. Bowel sounds are normal. She exhibits no distension. There is no tenderness.  Musculoskeletal:  BUE reveals no gross deformity or TTP. No midline cervical spine tenderness. ROM intact.  Tenderness over left hip with palpation.  LLE is externally rotated and appears slightly shortened as compared to the contralateral side.  Tenderness over the proximal femur.   Neurological: She is alert and oriented to  person, place, and time.  Skin: Skin is warm and dry.  Psychiatric: She has a normal mood and affect. Judgment normal.  Nursing note and vitals reviewed.   ED Course  Procedures (including critical care time) DIAGNOSTIC STUDIES: Oxygen Saturation is 100% on RA,  normal by my interpretation.    COORDINATION OF CARE: 11:16 PM Discussed treatment plan which includes lab work, EKG, XR of the left knee, XR of the left hip and pelvis, CXR, and morphine with pt at bedside and pt agreed to plan.  2:12 AM-Consult complete with  Orthopedics. Patient case explained and discussed. Call ended at 2:13 AM   Labs Review Labs Reviewed  CBC WITH DIFFERENTIAL/PLATELET - Abnormal; Notable for the following:    RBC 3.62 (*)    MCV 105.5 (*)    RDW 16.8 (*)    All other components within normal limits  COMPREHENSIVE METABOLIC PANEL - Abnormal; Notable for the following:    Glucose, Bld 175 (*)    BUN 26 (*)    Albumin 3.2 (*)    AST 52 (*)    All other components within normal limits  PROTIME-INR  TYPE AND SCREEN    Imaging Review Dg Chest 1 View  06/02/2015  CLINICAL DATA:  82 year old female with hip fracture. Preop radiograph. EXAM: CHEST 1 VIEW COMPARISON:  Chest CT dated 04/25/2015 under radiograph dated 04/17/2015 FINDINGS: Single-view of chest demonstrate an area of increased density in the right lower lung field which may represent atelectatic changes. Pneumonia is not excluded. Linear and platelike atelectasis/ scarring at the left lung base noted. There is no pleural effusion or pneumothorax. Mild cardiomegaly. Median sternotomy wires and CABG vascular clips. Osteopenia. No acute fracture. IMPRESSION: Small focal right lower lung field opacity may represent atelectasis. Pneumonia is not excluded. Electronically Signed   By: Elgie Collard M.D.   On: 06/02/2015 00:54   Dg Knee 1-2 Views Left  06/02/2015  CLINICAL DATA:  82 year old female with fall and left knee pain. EXAM: LEFT KNEE - 1-2  VIEW COMPARISON:  None. FINDINGS: No acute fracture or dislocation. The bones are osteopenic. There are chronic changes with chondrocalcinosis of the lateral and medial meniscus. No joint effusion. The soft tissues are grossly unremarkable. Multiple surgical clips noted in the soft tissues inferior to the knee. IMPRESSION: No acute fracture or dislocation. Electronically Signed   By: Elgie Collard M.D.   On: 06/02/2015 01:09   Dg Hip Unilat With Pelvis 2-3 Views Left  06/02/2015  CLINICAL DATA:  82 year old female with fall and left hip pain. EXAM: DG HIP (WITH OR WITHOUT PELVIS) 2-3V LEFT COMPARISON:  Radiograph dated 04/17/2015 FINDINGS: There is advanced osteopenia which limits evaluation for fracture. There is intertrochanteric fracture of the left femur with mild proximal migration of the femoral shaft in relation to the head of the femur and mild impaction. The left femoral head remains in anatomic alignment with the acetabulum. There is a right hip arthroplasty. No dislocation. Probable old healed pubic bone fractures. IMPRESSION: Intertrochanteric fracture of the left femur. Electronically Signed   By: Elgie Collard M.D.   On: 06/02/2015 01:06   I have personally reviewed and evaluated these images and lab results as part of my medical decision-making.   EKG Interpretation   Date/Time:  Saturday June 01 2015 23:09:35 EST Ventricular Rate:  54 PR Interval:  124 QRS Duration: 123 QT Interval:  496 QTC Calculation: 470 R Axis:   52 Text Interpretation:  Sinus rhythm Probable left atrial enlargement Right  bundle branch block No significant change since last tracing Confirmed by  Rhunette Croft, MD, Janey Genta [phone]) on 06/01/2015 11:26:58 PM      MDM   Final diagnoses:  Closed left hip fracture, initial encounter (HCC)    I personally performed the services described in this documentation, which was scribed in my presence. The recorded information has been reviewed and is  accurate.   Pt comes in post fall. She is noted to have LLE pain. She denies any head trauma and has no nausea, vomiting, visual complains, seizures,  altered mental status, loss of consciousness, new weakness, or numbness.  Pt has LLE tenderness, and we have concerns for hip fracture/dislocation.  Xrays ordered.   2:18 AM Has L intertroc fracture. Spoke with Orthopedics - recommend NPO, they will see her 1st think in the morning. Medicine to admit.   Jacqueline Kaplan, MD 06/02/15 [phone]

## 2015-06-02 ENCOUNTER — Inpatient Hospital Stay (HOSPITAL_COMMUNITY): Payer: Commercial Managed Care - HMO

## 2015-06-02 ENCOUNTER — Emergency Department (HOSPITAL_COMMUNITY): Payer: Commercial Managed Care - HMO

## 2015-06-02 ENCOUNTER — Inpatient Hospital Stay (HOSPITAL_COMMUNITY): Payer: Commercial Managed Care - HMO | Admitting: Anesthesiology

## 2015-06-02 ENCOUNTER — Encounter (HOSPITAL_COMMUNITY)
Admission: EM | Disposition: A | Discharge status: Skilled Nursing Facility | Payer: Self-pay | Source: Home / Self Care | Attending: Internal Medicine

## 2015-06-02 ENCOUNTER — Encounter (HOSPITAL_COMMUNITY): Payer: Self-pay | Admitting: Family Medicine

## 2015-06-02 DIAGNOSIS — K219 Gastro-esophageal reflux disease without esophagitis: Secondary | ICD-10-CM | POA: Diagnosis present

## 2015-06-02 DIAGNOSIS — Z87891 Personal history of nicotine dependence: Secondary | ICD-10-CM | POA: Diagnosis not present

## 2015-06-02 DIAGNOSIS — M810 Age-related osteoporosis without current pathological fracture: Secondary | ICD-10-CM | POA: Diagnosis present

## 2015-06-02 DIAGNOSIS — D62 Acute posthemorrhagic anemia: Secondary | ICD-10-CM | POA: Diagnosis not present

## 2015-06-02 DIAGNOSIS — I480 Paroxysmal atrial fibrillation: Secondary | ICD-10-CM | POA: Diagnosis present

## 2015-06-02 DIAGNOSIS — R296 Repeated falls: Secondary | ICD-10-CM | POA: Insufficient documentation

## 2015-06-02 DIAGNOSIS — S72009A Fracture of unspecified part of neck of unspecified femur, initial encounter for closed fracture: Secondary | ICD-10-CM | POA: Diagnosis present

## 2015-06-02 DIAGNOSIS — F0390 Unspecified dementia without behavioral disturbance: Secondary | ICD-10-CM | POA: Diagnosis present

## 2015-06-02 DIAGNOSIS — I10 Essential (primary) hypertension: Secondary | ICD-10-CM | POA: Diagnosis not present

## 2015-06-02 DIAGNOSIS — Z01810 Encounter for preprocedural cardiovascular examination: Secondary | ICD-10-CM | POA: Diagnosis not present

## 2015-06-02 DIAGNOSIS — I350 Nonrheumatic aortic (valve) stenosis: Secondary | ICD-10-CM | POA: Diagnosis present

## 2015-06-02 DIAGNOSIS — E785 Hyperlipidemia, unspecified: Secondary | ICD-10-CM | POA: Diagnosis present

## 2015-06-02 DIAGNOSIS — Z9181 History of falling: Secondary | ICD-10-CM | POA: Diagnosis not present

## 2015-06-02 DIAGNOSIS — W19XXXA Unspecified fall, initial encounter: Secondary | ICD-10-CM | POA: Diagnosis not present

## 2015-06-02 DIAGNOSIS — S72002D Fracture of unspecified part of neck of left femur, subsequent encounter for closed fracture with routine healing: Secondary | ICD-10-CM | POA: Diagnosis not present

## 2015-06-02 DIAGNOSIS — R32 Unspecified urinary incontinence: Secondary | ICD-10-CM | POA: Diagnosis not present

## 2015-06-02 DIAGNOSIS — R197 Diarrhea, unspecified: Secondary | ICD-10-CM | POA: Diagnosis not present

## 2015-06-02 DIAGNOSIS — Z7983 Long term (current) use of bisphosphonates: Secondary | ICD-10-CM | POA: Diagnosis not present

## 2015-06-02 DIAGNOSIS — M25552 Pain in left hip: Secondary | ICD-10-CM | POA: Diagnosis present

## 2015-06-02 DIAGNOSIS — H9190 Unspecified hearing loss, unspecified ear: Secondary | ICD-10-CM | POA: Diagnosis present

## 2015-06-02 DIAGNOSIS — Z79899 Other long term (current) drug therapy: Secondary | ICD-10-CM | POA: Diagnosis not present

## 2015-06-02 DIAGNOSIS — Z7982 Long term (current) use of aspirin: Secondary | ICD-10-CM | POA: Diagnosis not present

## 2015-06-02 DIAGNOSIS — F411 Generalized anxiety disorder: Secondary | ICD-10-CM | POA: Diagnosis present

## 2015-06-02 DIAGNOSIS — S72142D Displaced intertrochanteric fracture of left femur, subsequent encounter for closed fracture with routine healing: Secondary | ICD-10-CM | POA: Diagnosis not present

## 2015-06-02 DIAGNOSIS — R55 Syncope and collapse: Secondary | ICD-10-CM | POA: Diagnosis present

## 2015-06-02 DIAGNOSIS — W1830XA Fall on same level, unspecified, initial encounter: Secondary | ICD-10-CM | POA: Diagnosis present

## 2015-06-02 DIAGNOSIS — S72142A Displaced intertrochanteric fracture of left femur, initial encounter for closed fracture: Secondary | ICD-10-CM | POA: Diagnosis present

## 2015-06-02 DIAGNOSIS — S72002A Fracture of unspecified part of neck of left femur, initial encounter for closed fracture: Secondary | ICD-10-CM | POA: Diagnosis not present

## 2015-06-02 DIAGNOSIS — E43 Unspecified severe protein-calorie malnutrition: Secondary | ICD-10-CM | POA: Diagnosis present

## 2015-06-02 DIAGNOSIS — Z66 Do not resuscitate: Secondary | ICD-10-CM | POA: Diagnosis present

## 2015-06-02 DIAGNOSIS — Z681 Body mass index (BMI) 19 or less, adult: Secondary | ICD-10-CM | POA: Diagnosis not present

## 2015-06-02 DIAGNOSIS — W19XXXD Unspecified fall, subsequent encounter: Secondary | ICD-10-CM | POA: Diagnosis not present

## 2015-06-02 DIAGNOSIS — R339 Retention of urine, unspecified: Secondary | ICD-10-CM | POA: Diagnosis not present

## 2015-06-02 DIAGNOSIS — Y92009 Unspecified place in unspecified non-institutional (private) residence as the place of occurrence of the external cause: Secondary | ICD-10-CM | POA: Diagnosis not present

## 2015-06-02 DIAGNOSIS — Z419 Encounter for procedure for purposes other than remedying health state, unspecified: Secondary | ICD-10-CM

## 2015-06-02 DIAGNOSIS — E46 Unspecified protein-calorie malnutrition: Secondary | ICD-10-CM

## 2015-06-02 DIAGNOSIS — S72142S Displaced intertrochanteric fracture of left femur, sequela: Secondary | ICD-10-CM | POA: Diagnosis not present

## 2015-06-02 DIAGNOSIS — I2510 Atherosclerotic heart disease of native coronary artery without angina pectoris: Secondary | ICD-10-CM | POA: Diagnosis not present

## 2015-06-02 DIAGNOSIS — Z96641 Presence of right artificial hip joint: Secondary | ICD-10-CM | POA: Diagnosis present

## 2015-06-02 DIAGNOSIS — Z8249 Family history of ischemic heart disease and other diseases of the circulatory system: Secondary | ICD-10-CM | POA: Diagnosis not present

## 2015-06-02 DIAGNOSIS — I110 Hypertensive heart disease with heart failure: Secondary | ICD-10-CM

## 2015-06-02 DIAGNOSIS — Z951 Presence of aortocoronary bypass graft: Secondary | ICD-10-CM | POA: Diagnosis not present

## 2015-06-02 DIAGNOSIS — I119 Hypertensive heart disease without heart failure: Secondary | ICD-10-CM | POA: Diagnosis present

## 2015-06-02 HISTORY — PX: INTRAMEDULLARY (IM) NAIL INTERTROCHANTERIC: SHX5875

## 2015-06-02 LAB — PROTIME-INR
INR: 1.15 (ref 0.00–1.49)
PROTHROMBIN TIME: 14.9 s (ref 11.6–15.2)

## 2015-06-02 LAB — SURGICAL PCR SCREEN
MRSA, PCR: NEGATIVE
STAPHYLOCOCCUS AUREUS: NEGATIVE

## 2015-06-02 SURGERY — FIXATION, FRACTURE, INTERTROCHANTERIC, WITH INTRAMEDULLARY ROD
Anesthesia: Monitor Anesthesia Care | Site: Leg Upper | Laterality: Left

## 2015-06-02 MED ORDER — 0.9 % SODIUM CHLORIDE (POUR BTL) OPTIME
TOPICAL | Status: DC | PRN
  Administered 2015-06-02: 1000 mL

## 2015-06-02 MED ORDER — HYDROCODONE-ACETAMINOPHEN 5-325 MG PO TABS: 1.0000 | ORAL_TABLET | Freq: Four times a day (QID) | ORAL | Status: DC | PRN

## 2015-06-02 MED ORDER — FOLIC ACID 1 MG PO TABS
1.0000 mg | ORAL_TABLET | Freq: Every day | ORAL | Status: DC
  Administered 2015-06-03 – 2015-06-06 (×4): 1 mg via ORAL
  Filled 2015-06-02 (×6): qty 1

## 2015-06-02 MED ORDER — AMIODARONE HCL 100 MG PO TABS
200.0000 mg | ORAL_TABLET | Freq: Every day | ORAL | Status: DC
  Administered 2015-06-03 – 2015-06-06 (×4): 200 mg via ORAL
  Filled 2015-06-02 (×2): qty 2
  Filled 2015-06-02: qty 1
  Filled 2015-06-02 (×2): qty 2

## 2015-06-02 MED ORDER — METHOCARBAMOL 1000 MG/10ML IJ SOLN
500.0000 mg | Freq: Four times a day (QID) | INTRAVENOUS | Status: DC | PRN
  Filled 2015-06-02: qty 5

## 2015-06-02 MED ORDER — ATORVASTATIN CALCIUM 40 MG PO TABS
40.0000 mg | ORAL_TABLET | Freq: Every day | ORAL | Status: DC
  Administered 2015-06-02 – 2015-06-06 (×5): 40 mg via ORAL
  Filled 2015-06-02 (×5): qty 1

## 2015-06-02 MED ORDER — MEMANTINE HCL 10 MG PO TABS
10.0000 mg | ORAL_TABLET | Freq: Two times a day (BID) | ORAL | Status: DC
  Administered 2015-06-02 – 2015-06-06 (×8): 10 mg via ORAL
  Filled 2015-06-02 (×9): qty 1

## 2015-06-02 MED ORDER — PHENYLEPHRINE HCL 10 MG/ML IJ SOLN
10.0000 mg | INTRAVENOUS | Status: DC | PRN
  Administered 2015-06-02: 20 ug/min via INTRAVENOUS
  Administered 2015-06-02: 40 ug/min via INTRAVENOUS

## 2015-06-02 MED ORDER — METOCLOPRAMIDE HCL 5 MG PO TABS: 5.0000 mg | ORAL_TABLET | Freq: Three times a day (TID) | ORAL | Status: DC | PRN

## 2015-06-02 MED ORDER — PROPOFOL 10 MG/ML IV BOLUS
INTRAVENOUS | Status: DC | PRN
Start: 2015-06-02 — End: 2015-06-02
  Administered 2015-06-02: 30 mg via INTRAVENOUS

## 2015-06-02 MED ORDER — FENTANYL CITRATE (PF) 250 MCG/5ML IJ SOLN
INTRAMUSCULAR | Status: AC
  Filled 2015-06-02: qty 5

## 2015-06-02 MED ORDER — ONDANSETRON HCL 4 MG/2ML IJ SOLN
4.0000 mg | Freq: Once | INTRAMUSCULAR | Status: AC
  Administered 2015-06-02: 4 mg via INTRAVENOUS
  Filled 2015-06-02: qty 2

## 2015-06-02 MED ORDER — LACTATED RINGERS IV SOLN
INTRAVENOUS | Status: DC | PRN
  Administered 2015-06-02: 13:00:00 via INTRAVENOUS

## 2015-06-02 MED ORDER — MENTHOL 3 MG MT LOZG: 1.0000 | LOZENGE | OROMUCOSAL | Status: DC | PRN

## 2015-06-02 MED ORDER — MORPHINE SULFATE (PF) 2 MG/ML IV SOLN: 0.5000 mg | INTRAVENOUS | Status: DC | PRN

## 2015-06-02 MED ORDER — ACETAMINOPHEN 325 MG PO TABS
650.0000 mg | ORAL_TABLET | Freq: Four times a day (QID) | ORAL | Status: DC | PRN
  Administered 2015-06-02: 650 mg via ORAL
  Filled 2015-06-02 (×2): qty 2

## 2015-06-02 MED ORDER — MORPHINE SULFATE (PF) 2 MG/ML IV SOLN
0.5000 mg | INTRAVENOUS | Status: DC | PRN
  Administered 2015-06-02: 0.5 mg via INTRAVENOUS

## 2015-06-02 MED ORDER — POLYETHYLENE GLYCOL 3350 17 G PO PACK
17.0000 g | PACK | Freq: Every day | ORAL | Status: DC | PRN
  Filled 2015-06-02: qty 1

## 2015-06-02 MED ORDER — CEFAZOLIN SODIUM-DEXTROSE 2-3 GM-% IV SOLR
2.0000 g | Freq: Four times a day (QID) | INTRAVENOUS | Status: AC
  Administered 2015-06-02 – 2015-06-03 (×2): 2 g via INTRAVENOUS
  Filled 2015-06-02 (×2): qty 50

## 2015-06-02 MED ORDER — METOPROLOL TARTRATE 25 MG PO TABS: 25.0000 mg | ORAL_TABLET | Freq: Two times a day (BID) | ORAL | Status: DC

## 2015-06-02 MED ORDER — ONDANSETRON HCL 4 MG/2ML IJ SOLN: 4.0000 mg | Freq: Four times a day (QID) | INTRAMUSCULAR | Status: DC | PRN

## 2015-06-02 MED ORDER — PHENOL 1.4 % MT LIQD: 1.0000 | OROMUCOSAL | Status: DC | PRN

## 2015-06-02 MED ORDER — METHOCARBAMOL 500 MG PO TABS
500.0000 mg | ORAL_TABLET | Freq: Four times a day (QID) | ORAL | Status: DC | PRN
  Administered 2015-06-03: 500 mg via ORAL
  Filled 2015-06-02: qty 1

## 2015-06-02 MED ORDER — METOCLOPRAMIDE HCL 5 MG/ML IJ SOLN: 5.0000 mg | Freq: Three times a day (TID) | INTRAMUSCULAR | Status: DC | PRN

## 2015-06-02 MED ORDER — ACETAMINOPHEN 650 MG RE SUPP: 650.0000 mg | Freq: Four times a day (QID) | RECTAL | Status: DC | PRN

## 2015-06-02 MED ORDER — BUPIVACAINE IN DEXTROSE 0.75-8.25 % IT SOLN
INTRATHECAL | Status: DC | PRN
  Administered 2015-06-02: 12 mg via INTRATHECAL

## 2015-06-02 MED ORDER — SODIUM CHLORIDE 0.9 % IV SOLN
INTRAVENOUS | Status: DC
  Administered 2015-06-02 – 2015-06-03 (×3): via INTRAVENOUS

## 2015-06-02 MED ORDER — CEFAZOLIN SODIUM 1-5 GM-% IV SOLN
INTRAVENOUS | Status: AC
  Filled 2015-06-02: qty 50

## 2015-06-02 MED ORDER — ONDANSETRON HCL 4 MG PO TABS
4.0000 mg | ORAL_TABLET | Freq: Four times a day (QID) | ORAL | Status: DC | PRN
Start: 2015-06-02 — End: 2015-06-06

## 2015-06-02 MED ORDER — HYDROCODONE-ACETAMINOPHEN 5-325 MG PO TABS
1.0000 | ORAL_TABLET | Freq: Four times a day (QID) | ORAL | Status: DC | PRN
  Administered 2015-06-03: 1 via ORAL
  Filled 2015-06-02: qty 1

## 2015-06-02 MED ORDER — MORPHINE SULFATE (PF) 2 MG/ML IV SOLN
2.0000 mg | INTRAVENOUS | Status: DC | PRN
  Filled 2015-06-02: qty 1

## 2015-06-02 MED ORDER — ASPIRIN EC 325 MG PO TBEC: 325.0000 mg | DELAYED_RELEASE_TABLET | Freq: Every evening | ORAL | Status: DC

## 2015-06-02 MED ORDER — SODIUM CHLORIDE 0.9 % IV BOLUS (SEPSIS)
250.0000 mL | Freq: Once | INTRAVENOUS | Status: AC
  Administered 2015-06-02: 250 mL via INTRAVENOUS

## 2015-06-02 MED ORDER — WHITE PETROLATUM GEL
Status: DC | PRN
  Administered 2015-06-02: 0.2 via TOPICAL
  Filled 2015-06-02: qty 28.35

## 2015-06-02 MED ORDER — METOPROLOL TARTRATE 25 MG PO TABS
25.0000 mg | ORAL_TABLET | Freq: Two times a day (BID) | ORAL | Status: DC
  Administered 2015-06-02: 25 mg via ORAL
  Filled 2015-06-02: qty 1

## 2015-06-02 MED ORDER — ASPIRIN EC 325 MG PO TBEC
325.0000 mg | DELAYED_RELEASE_TABLET | Freq: Every day | ORAL | Status: DC
  Administered 2015-06-03 – 2015-06-06 (×4): 325 mg via ORAL
  Filled 2015-06-02 (×4): qty 1

## 2015-06-02 MED ORDER — CEFAZOLIN SODIUM-DEXTROSE 2-3 GM-% IV SOLR
INTRAVENOUS | Status: DC | PRN
  Administered 2015-06-02: 2 g via INTRAVENOUS

## 2015-06-02 MED ORDER — SODIUM CHLORIDE 0.9 % IV BOLUS (SEPSIS)
500.0000 mL | Freq: Once | INTRAVENOUS | Status: AC
  Administered 2015-06-03: 500 mL via INTRAVENOUS

## 2015-06-02 MED ORDER — FENTANYL CITRATE (PF) 100 MCG/2ML IJ SOLN
INTRAMUSCULAR | Status: DC | PRN
  Administered 2015-06-02: 50 ug via INTRAVENOUS

## 2015-06-02 SURGICAL SUPPLY — 46 items
BLADE SURG 15 STRL LF DISP TIS (BLADE) ×1 IMPLANT
BLADE SURG 15 STRL SS (BLADE) ×2
BNDG COHESIVE 4X5 TAN NS LF (GAUZE/BANDAGES/DRESSINGS) ×3 IMPLANT
BNDG GAUZE ELAST 4 BULKY (GAUZE/BANDAGES/DRESSINGS) ×3 IMPLANT
COVER PERINEAL POST (MISCELLANEOUS) ×3 IMPLANT
COVER SURGICAL LIGHT HANDLE (MISCELLANEOUS) ×3 IMPLANT
DRAPE PROXIMA HALF (DRAPES) IMPLANT
DRAPE STERI IOBAN 125X83 (DRAPES) ×3 IMPLANT
DRSG MEPILEX BORDER 4X4 (GAUZE/BANDAGES/DRESSINGS) ×3 IMPLANT
DRSG MEPILEX BORDER 4X8 (GAUZE/BANDAGES/DRESSINGS) ×3 IMPLANT
DRSG PAD ABDOMINAL 8X10 ST (GAUZE/BANDAGES/DRESSINGS) ×6 IMPLANT
DURAPREP 26ML APPLICATOR (WOUND CARE) ×3 IMPLANT
ELECT REM PT RETURN 9FT ADLT (ELECTROSURGICAL) ×3
ELECTRODE REM PT RTRN 9FT ADLT (ELECTROSURGICAL) ×1 IMPLANT
FACESHIELD WRAPAROUND (MASK) ×3 IMPLANT
GAUZE XEROFORM 1X8 LF (GAUZE/BANDAGES/DRESSINGS) ×3 IMPLANT
GAUZE XEROFORM 5X9 LF (GAUZE/BANDAGES/DRESSINGS) ×3 IMPLANT
GLOVE BIO SURGEON STRL SZ8 (GLOVE) ×3 IMPLANT
GLOVE BIOGEL PI IND STRL 8 (GLOVE) ×1 IMPLANT
GLOVE BIOGEL PI INDICATOR 8 (GLOVE) ×2
GLOVE ORTHO TXT STRL SZ7.5 (GLOVE) ×3 IMPLANT
GOWN STRL REUS W/ TWL LRG LVL3 (GOWN DISPOSABLE) ×1 IMPLANT
GOWN STRL REUS W/ TWL XL LVL3 (GOWN DISPOSABLE) ×2 IMPLANT
GOWN STRL REUS W/TWL LRG LVL3 (GOWN DISPOSABLE) ×2
GOWN STRL REUS W/TWL XL LVL3 (GOWN DISPOSABLE) ×4
GUIDE PIN 3.2MM (MISCELLANEOUS) ×2
GUIDE PIN ORTH 343X3.2XBRAD (MISCELLANEOUS) ×1 IMPLANT
KIT BASIN OR (CUSTOM PROCEDURE TRAY) ×3 IMPLANT
KIT ROOM TURNOVER OR (KITS) ×3 IMPLANT
LINER BOOT UNIVERSAL DISP (MISCELLANEOUS) ×3 IMPLANT
MANIFOLD NEPTUNE II (INSTRUMENTS) ×3 IMPLANT
NAIL TRIGEN LEFT 10X38-125 (Nail) ×3 IMPLANT
NS IRRIG 1000ML POUR BTL (IV SOLUTION) ×3 IMPLANT
PACK GENERAL/GYN (CUSTOM PROCEDURE TRAY) ×3 IMPLANT
PAD ARMBOARD 7.5X6 YLW CONV (MISCELLANEOUS) ×6 IMPLANT
PAD CAST 4YDX4 CTTN HI CHSV (CAST SUPPLIES) ×2 IMPLANT
PADDING CAST COTTON 4X4 STRL (CAST SUPPLIES) ×4
SCREW LAG COMPR KIT 90/85 (Screw) ×3 IMPLANT
STAPLER VISISTAT 35W (STAPLE) ×3 IMPLANT
SUT VIC AB 0 CT1 27 (SUTURE) ×6
SUT VIC AB 0 CT1 27XBRD ANBCTR (SUTURE) ×3 IMPLANT
SUT VIC AB 2-0 CT1 27 (SUTURE) ×6
SUT VIC AB 2-0 CT1 TAPERPNT 27 (SUTURE) ×3 IMPLANT
TOWEL OR 17X24 6PK STRL BLUE (TOWEL DISPOSABLE) ×3 IMPLANT
TOWEL OR 17X26 10 PK STRL BLUE (TOWEL DISPOSABLE) ×3 IMPLANT
WATER STERILE IRR 1000ML POUR (IV SOLUTION) ×3 IMPLANT

## 2015-06-02 NOTE — Anesthesia Procedure Notes (Addendum)
Spinal Patient location during procedure: OR Start time: 06/02/2015 1:16 PM End time: 06/02/2015 1:26 PM Staffing Anesthesiologist: Heather Roberts Performed by: anesthesiologist  Preanesthetic Checklist Completed: patient identified, surgical consent, pre-op evaluation, timeout performed, IV checked, risks and benefits discussed and monitors and equipment checked Spinal Block Patient position: left lateral decubitus Prep: DuraPrep Patient monitoring: cardiac monitor, continuous pulse ox and blood pressure Approach: midline Injection technique: single-shot Needle Needle type: Quincke  Needle gauge: 25 G Needle length: 9 cm Additional Notes Functioning IV was confirmed and monitors were applied. Sterile prep and drape, including hand hygiene and sterile gloves were used. The patient was positioned and the spine was prepped. The skin was anesthetized with lidocaine.  Free flow of clear CSF was obtained prior to injecting local anesthetic into the CSF.  The spinal needle aspirated freely following injection.  The needle was carefully withdrawn.  The patient tolerated the procedure well.   Procedure Name: MAC Date/Time: 06/02/2015 1:16 PM Performed by: Wray Kearns A Pre-anesthesia Checklist: Patient identified, Emergency Drugs available, Suction available, Patient being monitored and Timeout performed Patient Re-evaluated:Patient Re-evaluated prior to inductionOxygen Delivery Method: Nasal cannula Intubation Type: IV induction Placement Confirmation: positive ETCO2 Dental Injury: Teeth and Oropharynx as per pre-operative assessment

## 2015-06-02 NOTE — Anesthesia Preprocedure Evaluation (Signed)
Anesthesia Evaluation    Reviewed: Allergy & Precautions, H&P , NPO status , Patient's Chart, lab work & pertinent test results  History of Anesthesia Complications Negative for: history of anesthetic complications  Airway Mallampati: I  TM Distance: >3 FB Neck ROM: Full    Dental  (+) Teeth Intact, Caps, Dental Advisory Given   Pulmonary former smoker,    breath sounds clear to auscultation       Cardiovascular hypertension, + CAD   Rhythm:Regular Rate:Normal     Neuro/Psych PSYCHIATRIC DISORDERS Depression negative neurological ROS     GI/Hepatic Neg liver ROS, GERD  ,  Endo/Other  negative endocrine ROS  Renal/GU negative Renal ROS     Musculoskeletal   Abdominal   Peds  Hematology negative hematology ROS (+)   Anesthesia Other Findings   Reproductive/Obstetrics                             Anesthesia Physical  Anesthesia Plan  ASA: III  Anesthesia Plan: MAC and Spinal   Post-op Pain Management:    Induction: Intravenous  Airway Management Planned: Simple Face Mask  Additional Equipment: Arterial line  Intra-op Plan:   Post-operative Plan:   Informed Consent:   Plan Discussed with:   Anesthesia Plan Comments:         Anesthesia Quick Evaluation

## 2015-06-02 NOTE — Consult Note (Signed)
Reason for Consult:  Left hip fracture Referring Physician:   EDP  Jacqueline [name] is an 82 y.o. female.  HPI: 82 yo female with multiple medical problems as well as a fall history.  Sustained an accidental fall late last night and was brought to the ED with left hip pain and an obvious deformity.  X-rays confirmed a left hip intertrochanteric fracture.  Orthopedics is consulted for further eval and treatment of this injury.  She is awake at the bedside and does follow commands appropriately.  She complains of left hip pain.  Past Medical History  Diagnosis Date  . Diverticulitis   . Hypertension   . Osteoporosis   . CAD (coronary artery disease)   . Dementia   . PAF (paroxysmal atrial fibrillation) ([place])   . HLD (hyperlipidemia)   . GERD (gastroesophageal reflux disease)   . Community acquired pneumonia   . Dyslipidemia   . Constipation   . Anorexia   . UTI (lower urinary tract infection)   . Generalized weakness   . Compression fracture of T12 vertebra (H[facility])   . Hyponatremia   . Pressure ulcer   . Depression   . Severe muscle deconditioning   . Right knee pain   . Allergic rhinitis   . Protein calorie malnutrition ([place])   . SOB (shortness of breath)   . Cerumen impaction   . Insomnia   . Hearing loss of aging     Past Surgical History  Procedure Laterality Date  . Fracture surgery    . Partial colectomy    . Coronary artery bypass graft N/A 08/25/2012    Procedure: CORONARY ARTERY BYPASS GRAFTING ;  Surgeon: Gaye Pollack, MD;  Location: [place];  Service: Open Heart Surgery;  Laterality: N/A;  four bypasses total   . Left heart catheterization with coronary angiogram N/A 08/22/2012    Procedure: LEFT HEART CATHETERIZATION WITH CORONARY ANGIOGRAM;  Surgeon: Peter M Martinique, MD;  Location: [facility] CATH LAB;  Service: Cardiovascular;  Laterality: N/A;    Family History  Problem Relation Age of Onset  . Hypertension Other   . Cancer Mother     stomach  . Diabetes Neg Hx    . Heart disease Neg Hx   . Stroke Neg Hx     Social History:  reports that she has quit smoking. Her smoking use included Cigarettes. She has never used smokeless tobacco. She reports that she does not drink alcohol or use illicit drugs.  Allergies: No Known Allergies  Medications: I have reviewed the patient's current medications.  Results for orders placed or performed during the hospital encounter of 06/01/15 (from the past 48 hour(s))  CBC with Differential     Status: Abnormal   Collection Time: 06/01/15 10:26 PM  Result Value Ref Range   WBC 8.7 4.0 - 10.5 K/uL   RBC 3.62 (L) 3.87 - 5.11 MIL/uL   Hemoglobin 12.1 12.0 - 15.0 g/dL   HCT 38.2 36.0 - 46.0 %   MCV 105.5 (H) 78.0 - 100.0 fL   MCH 33.4 26.0 - 34.0 pg   MCHC 31.7 30.0 - 36.0 g/dL   RDW 16.8 (H) 11.5 - 15.5 %   Platelets 269 150 - 400 K/uL   Neutrophils Relative % 81 %   Neutro Abs 7.0 1.7 - 7.7 K/uL   Lymphocytes Relative 13 %   Lymphs Abs 1.2 0.7 - 4.0 K/uL   Monocytes Relative 5 %   Monocytes Absolute 0.4 0.1 - 1.0  K/uL   Eosinophils Relative 1 %   Eosinophils Absolute 0.1 0.0 - 0.7 K/uL   Basophils Relative 0 %   Basophils Absolute 0.0 0.0 - 0.1 K/uL  Comprehensive metabolic panel     Status: Abnormal   Collection Time: 06/01/15 10:26 PM  Result Value Ref Range   Sodium 141 135 - 145 mmol/L   Potassium 4.9 3.5 - 5.1 mmol/L   Chloride 103 101 - 111 mmol/L   CO2 27 22 - 32 mmol/L   Glucose, Bld 175 (H) 65 - 99 mg/dL   BUN 26 (H) 6 - 20 mg/dL   Creatinine, Ser 0.86 0.44 - 1.00 mg/dL   Calcium 10.0 8.9 - 10.3 mg/dL   Total Protein 6.5 6.5 - 8.1 g/dL   Albumin 3.2 (L) 3.5 - 5.0 g/dL   AST 52 (H) 15 - 41 U/L   ALT 38 14 - 54 U/L   Alkaline Phosphatase 98 38 - 126 U/L   Total Bilirubin 0.6 0.3 - 1.2 mg/dL   GFR calc non Af Amer >60 >60 mL/min   GFR calc Af Amer >60 >60 mL/min    Comment: (NOTE) The eGFR has been calculated using the CKD EPI equation. This calculation has not been validated in all  clinical situations. eGFR's persistently <60 mL/min signify possible Chronic Kidney Disease.    Anion gap 11 5 - 15  Protime-INR     Status: None   Collection Time: 06/02/15 12:44 AM  Result Value Ref Range   Prothrombin Time 14.9 11.6 - 15.2 seconds   INR 1.15 0.00 - 1.49  Type and screen [place]     Status: None   Collection Time: 06/02/15 12:44 AM  Result Value Ref Range   ABO/RH(D) A NEG    Antibody Screen NEG    Sample Expiration 06/05/2015     Dg Chest 1 View  06/02/2015  CLINICAL DATA:  82 year old female with hip fracture. Preop radiograph. EXAM: CHEST 1 VIEW COMPARISON:  Chest CT dated 04/25/2015 under radiograph dated 04/17/2015 FINDINGS: Single-view of chest demonstrate an area of increased density in the right lower lung field which may represent atelectatic changes. Pneumonia is not excluded. Linear and platelike atelectasis/ scarring at the left lung base noted. There is no pleural effusion or pneumothorax. Mild cardiomegaly. Median sternotomy wires and CABG vascular clips. Osteopenia. No acute fracture. IMPRESSION: Small focal right lower lung field opacity may represent atelectasis. Pneumonia is not excluded. Electronically Signed   By: Anner Crete M.D.   On: 06/02/2015 00:54   Dg Knee 1-2 Views Left  06/02/2015  CLINICAL DATA:  82 year old female with fall and left knee pain. EXAM: LEFT KNEE - 1-2 VIEW COMPARISON:  None. FINDINGS: No acute fracture or dislocation. The bones are osteopenic. There are chronic changes with chondrocalcinosis of the lateral and medial meniscus. No joint effusion. The soft tissues are grossly unremarkable. Multiple surgical clips noted in the soft tissues inferior to the knee. IMPRESSION: No acute fracture or dislocation. Electronically Signed   By: Anner Crete M.D.   On: 06/02/2015 01:09   Dg Hip Unilat With Pelvis 2-3 Views Left  06/02/2015  CLINICAL DATA:  82 year old female with fall and left hip pain. EXAM: DG  HIP (WITH OR WITHOUT PELVIS) 2-3V LEFT COMPARISON:  Radiograph dated 04/17/2015 FINDINGS: There is advanced osteopenia which limits evaluation for fracture. There is intertrochanteric fracture of the left femur with mild proximal migration of the femoral shaft in relation to the head of  the femur and mild impaction. The left femoral head remains in anatomic alignment with the acetabulum. There is a right hip arthroplasty. No dislocation. Probable old healed pubic bone fractures. IMPRESSION: Intertrochanteric fracture of the left femur. Electronically Signed   By: Anner Crete M.D.   On: 06/02/2015 01:06    ROS Blood pressure 131/63, pulse 56, temperature 97.7 F (36.5 C), temperature source Oral, resp. rate 22, weight 47.628 kg (105 lb), SpO2 98 %. Physical Exam  Constitutional: She appears well-developed and well-nourished.  Eyes: Pupils are equal, round, and reactive to light.  Neck: Normal range of motion. Neck supple.  Cardiovascular: Normal rate.   Respiratory: Effort normal and breath sounds normal.  GI: Soft. Bowel sounds are normal.  Musculoskeletal:       Left hip: She exhibits decreased range of motion, decreased strength, tenderness and deformity.  Neurological: She is alert.  Skin: Skin is warm and dry.  Psychiatric: She has a normal mood and affect.    Assessment/Plan: Left hip with displaced intertrochanteric femur fracture 1)  Given her considerable pain and the displacement of the fracture, surgery has been recommended to stabilize her injury.  This can be accomplished thru small incisions and will allow for early weight-bearing.  Certainly risks are involved given her medical/cardiac history.  She has been seen by [facility] and a Cardiology consult has been recommended.  Would like to proceed to surgery today pending clearance.  Will also discuss in detail with her son.  Anabelen Kaminsky Y 06/02/2015, 7:39 AM

## 2015-06-02 NOTE — Anesthesia Postprocedure Evaluation (Signed)
Anesthesia Post Note  Patient: Jacqueline [name]  Procedure(s) Performed: Procedure(s) (LRB): INTRAMEDULLARY (IM) NAIL INTERTROCHANTRIC (Left)  Patient location during evaluation: PACU Anesthesia Type: Spinal and MAC Level of consciousness: awake and alert Pain management: pain level controlled Vital Signs Assessment: post-procedure vital signs reviewed and stable Respiratory status: spontaneous breathing and respiratory function stable Cardiovascular status: blood pressure returned to baseline and stable Postop Assessment: spinal receding Anesthetic complications: no    Last Vitals:  Filed Vitals:   06/02/15 1523 06/02/15 1551  BP: 108/55 117/75  Pulse: 49 70  Temp: 36.7 C 36.7 C  Resp: 14 18    Last Pain:  Filed Vitals:   06/02/15 1611  PainSc: 0-No pain                 Estee Yohe DANIEL

## 2015-06-02 NOTE — Assessment & Plan Note (Signed)
Per cardiology 

## 2015-06-02 NOTE — ED Notes (Signed)
Patient placed in room 25  Alert and oriented.  SCD's applied.

## 2015-06-02 NOTE — ED Notes (Signed)
Attempted report 

## 2015-06-02 NOTE — H&P (Signed)
History and Physical  Patient Name: Jacqueline [name]     ZOX:[id number]    DOB: 03-Nov-1928    DOA: 06/01/2015 Referring physician: Derwood Kaplan, MD PCP: Loreen Freud, DO      Chief Complaint: Hip pain and fall  HPI: Jacqueline [name] is a 82 y.o. female with a past medical history significant for CAD s/b CABG, pAF on amiodarone no warfarin, and aortic stenosis who presents with hip pain after a fall.  The patient was admitted 2 months ago after a fall with orthostasis, diagnosed with pneumonia and treated and was in rehabilitation until 24 hours before admission. She had just been discharged home to stay with her son and daughter-in-law, when she was walking for the phone tonight, began to feel lightheaded and "faint", and fell on the ground. There was no LOC. There is no chest pain or palpitations. She had immediate L hip pain and could not walk.    In the ED, the patient was clinically stable and a plain radiograph of the LEFT hip showed a intertrochanteric fracture.  The case was discussed with Dr. Magnus Ivan who agreed to see the patient, and [facility] were asked to admit for medical management.    There was no preceding vertigo, no chest discomfort, palpitations, or dyspnea, nor are there any of those symptoms now.  The patient denies active heart issues, angina. She has had a CABG. She does not typically exert to an equivalent of 4 METS.     Review of Systems:  Pt complains of L hip pain. All other systems negative except as just noted or noted in the history of present illness.         No Known Allergies  Prior to Admission medications   Medication Sig Start Date End Date Taking? Authorizing Provider  acetaminophen (TYLENOL) 325 MG tablet Take 2 tablets (650 mg total) by mouth every 6 (six) hours as needed for mild pain or headache. Patient taking differently: Take 325 mg by mouth every 6 (six) hours as needed for mild pain or headache.  04/30/15  Yes Zannie Cove, MD    alendronate (FOSAMAX) 70 MG tablet TAKE 1 TABLET BY MOUTH EVERY 7 DAYS. TAKE WITH A FULL GLASS OF WATER ON AN EMPTY STOMACH 02/25/15  Yes Sheliah Hatch, MD  amiodarone (PACERONE) 200 MG tablet TAKE 1 TABLET BY MOUTH DAILY. 05/10/15  Yes Sheliah Hatch, MD  aspirin EC 325 MG tablet Take 325 mg by mouth every evening.    Yes Historical Provider, MD  atorvastatin (LIPITOR) 40 MG tablet TAKE 1 TABLET BY MOUTH DAILY AT 6 PM 05/30/15  Yes Sheliah Hatch, MD  diazepam (VALIUM) 2 MG tablet 1/2 tab daily and then use other 1/2 as needed for high anxiety moments Patient taking differently: Take 1 mg by mouth at bedtime.  05/31/15  Yes Lelon Perla, DO  folic acid (FOLVITE) 1 MG tablet TAKE 1 TABLET BY MOUTH EVERY DAY 04/16/15  Yes Sheliah Hatch, MD  lisinopril (PRINIVIL,ZESTRIL) 40 MG tablet Take 1 tablet (40 mg total) by mouth daily. 09/11/14  Yes Lewayne Bunting, MD  LUMIGAN 0.01 % SOLN Place 1 drop into both eyes at bedtime. 05/15/15  Yes Historical Provider, MD  memantine (NAMENDA) 10 MG tablet TAKE 1 TABLET (10 MG TOTAL) BY MOUTH 2 (TWO) TIMES DAILY. 02/07/15  Yes Adam Mliss Fritz, DO  metoprolol tartrate (LOPRESSOR) 25 MG tablet Take 1 tablet by mouth 2 (two) times daily. 03/04/15  Yes Historical  Provider, MD  Multiple Vitamin (MULTIVITAMIN WITH MINERALS) TABS tablet Take 0.5 tablets by mouth 2 (two) times daily.   Yes Historical Provider, MD  polyethylene glycol (MIRALAX / GLYCOLAX) packet Take 17 g by mouth daily as needed. Patient taking differently: Take 17 g by mouth daily as needed for mild constipation.  01/17/15  Yes Calvert Cantor, MD    Past Medical History  Diagnosis Date  . Diverticulitis   . Hypertension   . Osteoporosis   . CAD (coronary artery disease)   . Dementia   . PAF (paroxysmal atrial fibrillation) ([facility])   . HLD (hyperlipidemia)   . GERD (gastroesophageal reflux disease)   . Community acquired pneumonia   . Dyslipidemia   . Constipation   . Anorexia   . UTI  (lower urinary tract infection)   . Generalized weakness   . Compression fracture of T12 vertebra (H[facility])   . Hyponatremia   . Pressure ulcer   . Depression   . Severe muscle deconditioning   . Right knee pain   . Allergic rhinitis   . Protein calorie malnutrition ([facility])   . SOB (shortness of breath)   . Cerumen impaction   . Insomnia   . Hearing loss of aging     Past Surgical History  Procedure Laterality Date  . Fracture surgery    . Partial colectomy    . Coronary artery bypass graft N/A 08/25/2012    Procedure: CORONARY ARTERY BYPASS GRAFTING ;  Surgeon: Alleen Borne, MD;  Location: [facility] OR;  Service: Open Heart Surgery;  Laterality: N/A;  four bypasses total   . Left heart catheterization with coronary angiogram N/A 08/22/2012    Procedure: LEFT HEART CATHETERIZATION WITH CORONARY ANGIOGRAM;  Surgeon: Peter M [place]az[place], MD;  Location: [facility] CATH LAB;  Service: Cardiovascular;  Laterality: N/A;    Family history: family history includes Cancer in her mother; Hypertension in her other. There is no history of Diabetes, Heart disease, or Stroke.  Social History: Patient was just discharged from SNF to live with her son and daguther in [place].  She is from [place].  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  She has minimal smoking history.  She is supposed to walk with a walker.         Physical Exam: BP 132/65 mmHg  Pulse 54  Temp(Src) 97.7 F (36.5 C) (Oral)  Resp 15  Wt 47.628 kg (105 lb)  SpO2 98% General appearance: Frail, elderly, kyphotic adult female, alert and in mild distress from pain.   Eyes: Anicteric, conjunctiva pink, lids and lashes normal.     ENT: No nasal deformity, discharge, or epistaxis.  OP dry but without lesions.   Skin: Warm and dry.   Cardiac: RRR, nl S1-S2, 3/6 harsh systolic murmur, precordial heave.  Capillary refill is brisk.  No JVD, no significant LE edema.  Radial pulses 2+ and symmetric. Respiratory: Normal  respiratory rate and rhythm.  CTAB without rales or wheezes. Abdomen: Abdomen soft without rigidity.  No TTP. No ascites, distension.   MSK: L leg externally rotated and foreshortened. Neuro: Mild demential.  Sensorium intact and responding to questions, attention normal.  Speech is fluent.  Moves all extremities equally and with normal coordination.    Psych: Behavior appropriate.  Affect pleasant.  No evidence of aural or visual hallucinations or delusions.       Labs on Admission:  The metabolic panel shows .normal sodium, potassium, bicarbonate,  and renal function AST is minimally elevated. INR nromal The complete blood count shows no anemia or thrombocytopenia.   Radiological Exams on Admission: Personally reviewed: Dg Chest 1 View 06/02/2015   IMPRESSION: Small focal right lower lung field opacity may represent atelectasis. Pneumonia is not excluded.    Dg Knee 1-2 Views Left 06/02/2015 IMPRESSION: No acute fracture or dislocation.   Dg Hip Unilat With Pelvis 2-3 Views Left 06/02/2015  IMPRESSION: Intertrochanteric fracture of the left femur.     EKG: Independently reviewed. Rate 54, QTc 470, RBBB no ST changes.  Sinus rhythm.   Echocardiogram 08/2012: EF 60-65%, Aortic valve area 1.5 cm^2     Assessment and Plan: 1. Hip fracture: The patient will be seen by Dr. Magnus Ivan in the morning at [facility], to evaluate for operative fixation of the L hip.   -Admit to med-surg bed -Hydrocodone-acetaminophen or morphine as tolerated for pain -Bed rest, apply ice, document sedation and vitals per Hip fracture protocol -NPO at midnight -MIVF  Patient has a RCRI score of 1 and no active cardiac conditions, but she has known AS without echocardiogram since 2014. -Echocardiogram is ordered -Consult to Cardiology for preoperative clearance.    2. Afib: ChADS2Vasc 4.  Not on warfarin it appears.  Currently rhythm controlled with amio.  INR normal. -Continue metoprolol and  amiodarone  3. HTN and coronary disease secondary prevention: -Continue statin, aspirin, and metoprolol -Hold lisinopril until post-operatively -PRN hydralazine for excessive hypertension  4. Dementia: -May continue Namenda  5. Syncope: Suspect this was orthostatic again, in context of deconditioning.   -Check echocardiogram -Observe on telemetry  6. Chest radiograph: In the clinical context, without cough, hypoxia, dyspnea, fever or leukocytosis, this is not favored to be pneumonia.        DVT PPx: SCDs Diet: NPO at midnight  Consultants: Orthopedics Code Status: DO NOT RESUSCITATE Family Communication: None present  Medical decision making: What exists of the patient's previous chart was reviewed in depth and the case was discussed with Dr. Rhunette Croft. Patient seen 3:00 AM on 06/02/2015.  Disposition Plan:  Admit to [facility] for hip fracture.  Consult with Ortho tomorrow.  Consult to Cardiology for preopserative clearance.     Alberteen Sam [facility] Pager [phone]

## 2015-06-02 NOTE — Progress Notes (Signed)
*  PRELIMINARY RESULTS* Echocardiogram 2D Echocardiogram has been performed.  Jacqueline [name] 06/02/2015, 9:55 AM

## 2015-06-02 NOTE — Consult Note (Signed)
Cardiology Consult    Patient ID: Jacqueline [name] MRN: [id number], DOB/AGE: 1929-04-07   Admit date: 06/01/2015 Date of Consult: 06/02/2015  Primary Physician: Loreen Freud, DO Primary Cardiologist: B. Jens Som, MD  Requesting Provider: C. Blackmon, MD  Patient Profile    82 y/o ? with a h/o CAD s/p CABG, HTN, PAF on amio, and mild AS, who was admitted following fall and L femur Fx.  Past Medical History   Past Medical History  Diagnosis Date  . Diverticulitis   . Hypertensive heart disease   . Osteoporosis   . CAD (coronary artery disease)     a. 08/2012 Abnl MV;  b. 08/2012 CABGx5: LIMA->LAD, VG->Diag, VG->OM1->OM2, VG->RCA.  Marland Kitchen Dementia   . PAF (paroxysmal atrial fibrillation) (HCC)     a. Rhythm mgmt w/ amio;  b. CHA2DS2VASc = 5-->No anticoagulation 2/2 dementia/falls.  Marland Kitchen HLD (hyperlipidemia)   . GERD (gastroesophageal reflux disease)   . Dyslipidemia   . Constipation   . Anorexia   . UTI (lower urinary tract infection)   . Generalized weakness   . Compression fracture of T12 vertebra (HCC)   . Hyponatremia   . Pressure ulcer   . Depression   . Severe muscle deconditioning   . Right knee pain   . Allergic rhinitis   . Protein calorie malnutrition (HCC)   . Cerumen impaction   . Insomnia   . Hearing loss of aging   . Mild aortic stenosis     a. 08/2012 Echo: EF 60-65%, no rwma, mild AS, mildly dil LA, PASP .    Past Surgical History  Procedure Laterality Date  . Fracture surgery    . Partial colectomy    . Coronary artery bypass graft N/A 08/25/2012    Procedure: CORONARY ARTERY BYPASS GRAFTING ;  Surgeon: Alleen Borne, MD;  Location: [facility] OR;  Service: Open Heart Surgery;  Laterality: N/A;  four bypasses total   . Left heart catheterization with coronary angiogram N/A 08/22/2012    Procedure: LEFT HEART CATHETERIZATION WITH CORONARY ANGIOGRAM;  Surgeon: Peter M Swaziland, MD;  Location: [facility] CATH LAB;  Service: Cardiovascular;  Laterality: N/A;      Allergies  No Known Allergies  History of Present Illness    82 y/o ? with the above complex PMH.  She is s/p CABG x 5 in 08/2012 and since then, has done reasonably well from a cardiac standpoint.  She is not particularly active @ home and does have a h/o unsteadiness and falls.  She was recently hospitalized 2/2 fall in the setting of pna and orthostasis and subsequently d/c'd to rehab.  She apparently did well in rehab and was d/c'd to home with her son and dtr in law on 2/17.  Unfortunately, on 2/18, she was walking from her kitchen to her living room and felt lightheaded.  She tried to make it to her recliner but lost her balance and fell.  She did not lose consciousness.  She noted left leg/hip pain and was taken to [facility] ED where xrays revealed a left intertrochanteric femur fx.  Plan is for surgical correction today and we've been asked to eval.  No recent c/p.  She occasionally experiences DOE but is not very active.  No events on tele since admission.  Inpatient Medications    . amiodarone  200 mg Oral Daily  . aspirin EC  325 mg Oral QPM  . atorvastatin  40 mg Oral q1800  . folic acid  1  mg Oral Daily  . memantine  10 mg Oral BID  . metoprolol tartrate  25 mg Oral BID    Family History    Family History  Problem Relation Age of Onset  . Hypertension Other   . Cancer Mother     stomach  . Diabetes Neg Hx   . Heart disease Neg Hx   . Stroke Neg Hx     Social History    Social History   Social History  . Marital Status: Widowed    Spouse Name: N/A  . Number of Children: 2  . Years of Education: 13+   Occupational History  . Retired    Social History Main Topics  . Smoking status: Former Smoker    Types: Cigarettes  . Smokeless tobacco: Never Used  . Alcohol Use: No     Comment: Occasional  . Drug Use: No  . Sexual Activity: No   Other Topics Concern  . Not on file   Social History Narrative   Regular exercise-no   Caffeine Use-yes     Review  of Systems    General:  No chills, fever, night sweats or weight changes.  Cardiovascular:  No chest pain, occas dyspnea on exertion, no edema, orthopnea, palpitations, paroxysmal nocturnal dyspnea. +++ orthostatic ss/lightheadedness. Dermatological: No rash, lesions/masses Respiratory: No cough, dyspnea Urologic: No hematuria, dysuria Abdominal:   No nausea, vomiting, diarrhea, bright red blood per rectum, melena, or hematemesis Neurologic:  No visual changes, wkns, changes in mental status. MSK: left leg pain. All other systems reviewed and are otherwise negative except as noted above.  Physical Exam    Blood pressure 141/64, pulse 58, temperature 98.5 F (36.9 C), temperature source Oral, resp. rate 16, weight 105 lb (47.628 kg), SpO2 100 %.  General: Pleasant, NAD Psych: Normal affect. Neuro: Alert and oriented X 3. Moves all extremities spontaneously. HEENT: Normal  Neck: Supple without bruits or JVD. Lungs:  Resp regular and unlabored, CTA. Heart: RRR no s3, s4, 3/6 SEM RUSB - heard throughout. Abdomen: Soft, non-tender, non-distended, BS + x 4.  Extremities: No clubbing, cyanosis or edema. DP/PT/Radials 2+ and equal bilaterally.  Labs     Lab Results  Component Value Date   WBC 8.7 06/01/2015   HGB 12.1 06/01/2015   HCT 38.2 06/01/2015   MCV 105.5* 06/01/2015   PLT 269 06/01/2015    Recent Labs Lab 06/01/15 2226  NA 141  K 4.9  CL 103  CO2 27  BUN 26*  CREATININE 0.86  CALCIUM 10.0  PROT 6.5  BILITOT 0.6  ALKPHOS 98  ALT 38  AST 52*  GLUCOSE 175*   Lab Results  Component Value Date   CHOL 93 05/31/2015   HDL 42.70 05/31/2015   LDLCALC 34 05/31/2015   TRIG 82.0 05/31/2015   Lab Results  Component Value Date   DDIMER 0.84* 08/03/2012     Radiology Studies    Dg Chest 1 View  06/02/2015  CLINICAL DATA:  82 year old female with hip fracture. Preop radiograph. EXAM: CHEST 1 VIEW COMPARISON:  Chest CT dated 04/25/2015 under radiograph dated  04/17/2015 FINDINGS: Single-view of chest demonstrate an area of increased density in the right lower lung field which may represent atelectatic changes. Pneumonia is not excluded. Linear and platelike atelectasis/ scarring at the left lung base noted. There is no pleural effusion or pneumothorax. Mild cardiomegaly. Median sternotomy wires and CABG vascular clips. Osteopenia. No acute fracture. IMPRESSION: Small focal right lower lung field opacity may  represent atelectasis. Pneumonia is not excluded. Electronically Signed   By: Elgie Collard M.D.   On: 06/02/2015 00:54   Dg Knee 1-2 Views Left  06/02/2015  CLINICAL DATA:  82 year old female with fall and left knee pain. EXAM: LEFT KNEE - 1-2 VIEW COMPARISON:  None. FINDINGS: No acute fracture or dislocation. The bones are osteopenic. There are chronic changes with chondrocalcinosis of the lateral and medial meniscus. No joint effusion. The soft tissues are grossly unremarkable. Multiple surgical clips noted in the soft tissues inferior to the knee. IMPRESSION: No acute fracture or dislocation. Electronically Signed   By: Elgie Collard M.D.   On: 06/02/2015 01:09   Dg Hip Unilat With Pelvis 2-3 Views Left  06/02/2015  CLINICAL DATA:  82 year old female with fall and left hip pain. EXAM: DG HIP (WITH OR WITHOUT PELVIS) 2-3V LEFT COMPARISON:  Radiograph dated 04/17/2015 FINDINGS: There is advanced osteopenia which limits evaluation for fracture. There is intertrochanteric fracture of the left femur with mild proximal migration of the femoral shaft in relation to the head of the femur and mild impaction. The left femoral head remains in anatomic alignment with the acetabulum. There is a right hip arthroplasty. No dislocation. Probable old healed pubic bone fractures. IMPRESSION: Intertrochanteric fracture of the left femur. Electronically Signed   By: Elgie Collard M.D.   On: 06/02/2015 01:06    ECG & Cardiac Imaging    SB, 54, RBBB, LAE, no acute  st/t changes.  Assessment & Plan    1.  Left intertrochanteric femur Fx:  S/p fall after feeling lightheaded while walking.  Did not lose consciousness. Given age and co-morbidities, she is at moderate risk for a cardiac complication related to surgery however she has not been having chest pain or dyspnea.  She is in sinus rhythm and ecg is non-acute.  OK to proceed to OR w/o further ischemic evaluation.  Cont asa, bb, statin throughout perioperative period and we will follow along.  2.  CAD:  S/p CABG x 5 in 2014.  No recent c/p.  Activity limited @ home - uses walker, "I watch a lot of TV."  As above, at least moderate risk for cardiac complication r/t surgery however in the absence of c/p/symptoms, she will not require additional ischemic eval at this time.  Cont asa, bb, statin.  3.  PAF:  In sinus.  On amio.  No anticoagulation 2/2 h/o falls and dementia.  Watch for PAF post-op.  4.  Hypertensive Heart Disease:  Stable.  Follow.  5.  HL:  On statin.  Signed, Nicolasa Ducking, NP 06/02/2015, 11:31 AM    Attending note Patient seen and discussed with NP Brion Aliment, I agree with his documentation. Agree with proceeding with necessary ortho surgery. She has no active acute cardiac conditions, and has not had recent significant cardiac symptoms though she has a fairly sedentary lifestyle. Would not delay her neccesary surgery for noninvasive stress testing. Continue cardiac meds in the periop period.   Dominga Ferry MD

## 2015-06-02 NOTE — ED Notes (Signed)
Echo tech at bedside.

## 2015-06-02 NOTE — ED Notes (Signed)
Admitting MD at bedside.

## 2015-06-02 NOTE — Assessment & Plan Note (Signed)
Pt will get up and try to do things around the house when family / caretakers leave.  Discussed with pt and family importance of her not being left alone and discussed possible options.  -----home health comes now few x a week but pt needs more supervision.  --- ? Hiring sitter to stay with her and get her lunch and drinks as needed and just keep her company ---  Pt may need nursing home  Pt high fall risk

## 2015-06-02 NOTE — Transfer of Care (Signed)
Immediate Anesthesia Transfer of Care Note  Patient: Jacqueline [name]  Procedure(s) Performed: Procedure(s): INTRAMEDULLARY (IM) NAIL INTERTROCHANTRIC (Left)  Patient Location: PACU  Anesthesia Type:Spinal and MAC combined with regional for post-op pain  Level of Consciousness: awake, alert , oriented, patient cooperative and responds to stimulation  Airway & Oxygen Therapy: Patient Spontanous Breathing and Patient connected to nasal cannula oxygen  Post-op Assessment: Report given to RN, Post -op Vital signs reviewed and stable, Patient moving all extremities and Patient moving all extremities X 4  Post vital signs: Reviewed and stable  Last Vitals:  Filed Vitals:   06/02/15 1109 06/02/15 1207  BP: 141/64 142/62  Pulse: 58 60  Temp: 36.9 C   Resp: 16     Complications: No apparent anesthesia complications

## 2015-06-02 NOTE — ED Notes (Signed)
SCD's applied, Vaseline applied to lips.

## 2015-06-02 NOTE — Assessment & Plan Note (Signed)
con't meds for now  Consider stopping due to protein cal malnutrition

## 2015-06-02 NOTE — Progress Notes (Signed)
[facility] PROGRESS NOTE  Jacqueline [name] UXL:[id number] DOB: 10/10/1928 DOA: 06/01/2015 PCP: Loreen Freud, DO HPI/Subjective: 82 y.o. WF PMHx Dementia, Depression, CAD native artery S/P CABG, Paroxysmal AF on Amiodarone no warfarin, Aortic Stenosis, HLD, who presents with hip pain after a fall.  The patient was admitted 2 months ago after a fall with orthostasis, diagnosed with pneumonia and treated and was in rehabilitation until 24 hours before admission. She had just been discharged home to stay with her son and daughter-in-law, when she was walking for the phone tonight, began to feel lightheaded and "faint", and fell on the ground. There was no LOC. There is no chest pain or palpitations. She had immediate L hip pain and could not walk.   In the ED, the patient was clinically stable and a plain radiograph of the LEFT hip showed a intertrochanteric fracture. The case was discussed with Dr. Magnus Ivan orthopedic surgery who agreed to see the patient, and [facility] were asked to admit for medical management.   There was no preceding vertigo, no chest discomfort, palpitations, or dyspnea, nor are there any of those symptoms now.  The patient denies active heart issues, angina. She has had a CABG. She does not typically exert to an equivalent of 4 METS.    HPI/Subjective: 2/19 A/O 4, NAD. States feels chilly. Negative pain on left hip     Assessment/Plan: Left Hip fracture: -Patient has a RCRI score of 1 and no active cardiac conditions, but she has known AS without echocardiogram since 2014. -Echocardiogram is ordered -Spoke with NP Brion Aliment from [facility] and patient will be evaluated for cardiac clearance.  -Dr.Christopher Aretha Parrot orthopedic surgery performed left hip INTRAMEDULLARY (IM) NAIL  Afib: -ChADS2Vasc 4. Not on warfarin prior to admission.  -Rate controlled on Amiodarone and Metoprolol would hold these medications until the A.m.  -Amiodarone 200 mg restart on  2/20 -Metoprolol 25 mg BID restart 2/20  HTN and coronary disease secondary prevention: -See A. Fib  Syncope: -Suspect this was orthostatic again, in context of deconditioning.  -See left hip fracture    Dementia: -Namenda 10 mg BID    Code Status: DO NOT RESUSCITATE Family Communication: None Disposition Plan: Per orthopedic's   Consultants: Dr. Magnus Ivan orthopedic surgery [facility] pending  Procedures: 2/19 Echocardiogram;- Left ventricle: moderate LVH. LVEF= 60%-65%. - (grade 1 diastolicdysfunction). - Aortic valve Moderatelythickened, moderately calcified leaflets. Mild stenosis. - Left atrium: moderately dilated. - Right atrium: ildly dilated. 2/19 INTRAMEDULLARY (IM) NAIL INTERTROCHANTRIC left hip  Cultures NA  Antibiotics: Ancef 2/19>>  DVT prophylaxis SCD    Objective: Filed Vitals:   06/02/15 1438 06/02/15 1453 06/02/15 1523 06/02/15 1551  BP: 97/54 106/48 108/55 117/75  Pulse: 52 52 49 70  Temp:   98 F (36.7 C) 98 F (36.7 C)  TempSrc:      Resp: 16 21 14 18   Weight:      SpO2: 100% 100% 99% 100%    Intake/Output Summary (Last 24 hours) at 06/02/15 1628 Last data filed at 06/02/15 1409  Gross per 24 hour  Intake    400 ml  Output    100 ml  Net    300 ml   Filed Weights   06/01/15 2211  Weight: 47.628 kg (105 lb)     Exam: General A/O 4, NAD, No acute respiratory distress Eyes: Negative headache, positive bilateral black eyes secondary to fall,,negative scleral hemorrhage ENT: Negative Runny nose, negative ear pain, negative tinnitus, negative gingival bleeding, Neck:  Negative scars,  masses, torticollis, lymphadenopathy, JVD Lungs: Clear to auscultation bilaterally without wheezes or crackles Cardiovascular: Irregular irregular rhythm and rate, without murmur gallop or rub normal S1 and S2 Abdomen:negative abdominal pain, negative dysphagia, nondistended, positive soft, bowel sounds, no rebound, no ascites, no appreciable  mass Extremities: No significant cyanosis, clubbing. Left lateral hip bandage clear dry with ice pack., Psychiatric:  Negative depression, negative anxiety, negative fatigue, negative mania  Neurologic:  Cranial nerves II through XII intact, tongue/uvula midline, all extremities muscle strength 5/5, sensation intact throughout,negative dysarthria, negative expressive aphasia, negative receptive aphasia.     Data Reviewed: Basic Metabolic Panel:  Recent Labs Lab 05/31/15 1055 06/01/15 2226  NA 141 141  K 4.8 4.9  CL 105 103  CO2 31 27  GLUCOSE 117* 175*  BUN 21 26*  CREATININE 0.63 0.86  CALCIUM 9.4 10.0   Liver Function Tests:  Recent Labs Lab 05/31/15 1055 06/01/15 2226  AST 30 52*  ALT 25 38  ALKPHOS 88 98  BILITOT 0.4 0.6  PROT 6.7 6.5  ALBUMIN 3.5 3.2*   No results for input(s): LIPASE, AMYLASE in the last 168 hours. No results for input(s): AMMONIA in the last 168 hours. CBC:  Recent Labs Lab 05/31/15 1055 06/01/15 2226  WBC 7.7 8.7  NEUTROABS 6.3 7.0  HGB 12.4 12.1  HCT 37.0 38.2  MCV 101.1* 105.5*  PLT 282.0 269   Cardiac Enzymes: No results for input(s): CKTOTAL, CKMB, CKMBINDEX, TROPONINI in the last 168 hours. BNP (last 3 results)  Recent Labs  07/19/14 1818  BNP 191.0*    ProBNP (last 3 results) No results for input(s): PROBNP in the last 8760 hours.  CBG: No results for input(s): GLUCAP in the last 168 hours.  Recent Results (from the past 240 hour(s))  Surgical pcr screen     Status: None   Collection Time: 06/02/15 10:50 AM  Result Value Ref Range Status   MRSA, PCR NEGATIVE NEGATIVE Final   Staphylococcus aureus NEGATIVE NEGATIVE Final    Comment:        The Xpert SA Assay (FDA approved for NASAL specimens in patients over 9 years of age), is one component of a comprehensive surveillance program.  Test performance has been validated by [facility] for patients greater than or equal to 61 year old. It is not intended to  diagnose infection nor to guide or monitor treatment.      Studies: Dg Chest 1 View  06/02/2015  CLINICAL DATA:  82 year old female with hip fracture. Preop radiograph. EXAM: CHEST 1 VIEW COMPARISON:  Chest CT dated 04/25/2015 under radiograph dated 04/17/2015 FINDINGS: Single-view of chest demonstrate an area of increased density in the right lower lung field which may represent atelectatic changes. Pneumonia is not excluded. Linear and platelike atelectasis/ scarring at the left lung base noted. There is no pleural effusion or pneumothorax. Mild cardiomegaly. Median sternotomy wires and CABG vascular clips. Osteopenia. No acute fracture. IMPRESSION: Small focal right lower lung field opacity may represent atelectasis. Pneumonia is not excluded. Electronically Signed   By: Elgie Collard M.D.   On: 06/02/2015 00:54   Dg Knee 1-2 Views Left  06/02/2015  CLINICAL DATA:  82 year old female with fall and left knee pain. EXAM: LEFT KNEE - 1-2 VIEW COMPARISON:  None. FINDINGS: No acute fracture or dislocation. The bones are osteopenic. There are chronic changes with chondrocalcinosis of the lateral and medial meniscus. No joint effusion. The soft tissues are grossly unremarkable. Multiple surgical clips noted in the soft  tissues inferior to the knee. IMPRESSION: No acute fracture or dislocation. Electronically Signed   By: Elgie Collard M.D.   On: 06/02/2015 01:09   Dg C-arm 1-60 Min  06/02/2015  CLINICAL DATA:  ORIF left proximal femur fracture EXAM: DG C-ARM 61-120 MIN; LEFT FEMUR 2 VIEWS COMPARISON:  06/01/2015 left hip radiographs FINDINGS: Fluoroscopy time 49.8 seconds. Provided spot fluoroscopic intraoperative nondiagnostic radiographs of the left femur demonstrate transfixation of a comminuted intratrochanteric left proximal femur fracture by 2 left femoral neck screws with interlocking left femoral shaft intra medullary rod. IMPRESSION: Intraoperative fluoroscopic guidance for ORIF left proximal  femur fracture. Electronically Signed   By: Delbert Phenix M.D.   On: 06/02/2015 14:22   Dg Hip Unilat With Pelvis 2-3 Views Left  06/02/2015  CLINICAL DATA:  82 year old female with fall and left hip pain. EXAM: DG HIP (WITH OR WITHOUT PELVIS) 2-3V LEFT COMPARISON:  Radiograph dated 04/17/2015 FINDINGS: There is advanced osteopenia which limits evaluation for fracture. There is intertrochanteric fracture of the left femur with mild proximal migration of the femoral shaft in relation to the head of the femur and mild impaction. The left femoral head remains in anatomic alignment with the acetabulum. There is a right hip arthroplasty. No dislocation. Probable old healed pubic bone fractures. IMPRESSION: Intertrochanteric fracture of the left femur. Electronically Signed   By: Elgie Collard M.D.   On: 06/02/2015 01:06   Dg Femur Min 2 Views Left  06/02/2015  CLINICAL DATA:  ORIF left proximal femur fracture EXAM: DG C-ARM 61-120 MIN; LEFT FEMUR 2 VIEWS COMPARISON:  06/01/2015 left hip radiographs FINDINGS: Fluoroscopy time 49.8 seconds. Provided spot fluoroscopic intraoperative nondiagnostic radiographs of the left femur demonstrate transfixation of a comminuted intratrochanteric left proximal femur fracture by 2 left femoral neck screws with interlocking left femoral shaft intra medullary rod. IMPRESSION: Intraoperative fluoroscopic guidance for ORIF left proximal femur fracture. Electronically Signed   By: Delbert Phenix M.D.   On: 06/02/2015 14:22    Scheduled Meds: . amiodarone  200 mg Oral Daily  . [START ON 06/03/2015] aspirin EC  325 mg Oral Q breakfast  . atorvastatin  40 mg Oral q1800  . ceFAZolin      .  ceFAZolin (ANCEF) IV  2 g Intravenous Q6H  . folic acid  1 mg Oral Daily  . memantine  10 mg Oral BID  . [START ON 06/03/2015] metoprolol tartrate  25 mg Oral BID   Continuous Infusions: . sodium chloride 50 mL/hr at 06/02/15 1615    Principal Problem:   Closed intertrochanteric fracture  of left femur (HCC) Active Problems:   HTN (hypertension)   CAD (coronary artery disease)   Paroxysmal atrial fibrillation (HCC)   Aortic stenosis   Protein calorie malnutrition (HCC)   Hip fracture (HCC)   PAF (paroxysmal atrial fibrillation) (HCC)   Hypertensive heart disease   Closed left hip fracture (HCC)   Surgery, elective    Time spent: 35 minutes    WOODS, CURTIS J  [facility] Pager [phone]. If 7PM-7AM, please contact night-coverage at [email or web address], password [facility] 06/02/2015, 4:28 PM  LOS: 0 days    Care during the described time interval was provided by me .  I have reviewed this patient's available data, including medical history, events of note, physical examination, and all test results as part of my evaluation. I have personally reviewed and interpreted all radiology studies.   Carolyne Littles, MD [phone] Pager

## 2015-06-02 NOTE — Brief Op Note (Signed)
06/01/2015 - 06/02/2015  2:12 PM  PATIENT:  Jacqueline [name]  82 y.o. female  PRE-OPERATIVE DIAGNOSIS:  left intertroch hip fracture  POST-OPERATIVE DIAGNOSIS:  left intertroch hip fracture  PROCEDURE:  Procedure(s): INTRAMEDULLARY (IM) NAIL INTERTROCHANTRIC (Left)  SURGEON:  Surgeon(s) and Role:    * Kathryne Hitch, MD - Primary  ANESTHESIA:   spinal  EBL:  Total I/O In: 100 [I.V.:100] Out: 100 [Blood:100]  DICTATION: .Other Dictation: Dictation Number [id number]  PLAN OF CARE: Admit to inpatient   PATIENT DISPOSITION:  PACU - hemodynamically stable.   Delay start of Pharmacological VTE agent (>24hrs) due to surgical blood loss or risk of bleeding: no

## 2015-06-03 ENCOUNTER — Encounter (HOSPITAL_COMMUNITY): Payer: Self-pay | Admitting: Orthopaedic Surgery

## 2015-06-03 DIAGNOSIS — W19XXXD Unspecified fall, subsequent encounter: Secondary | ICD-10-CM

## 2015-06-03 DIAGNOSIS — E43 Unspecified severe protein-calorie malnutrition: Secondary | ICD-10-CM

## 2015-06-03 DIAGNOSIS — S72002D Fracture of unspecified part of neck of left femur, subsequent encounter for closed fracture with routine healing: Secondary | ICD-10-CM

## 2015-06-03 DIAGNOSIS — S72142S Displaced intertrochanteric fracture of left femur, sequela: Secondary | ICD-10-CM

## 2015-06-03 DIAGNOSIS — I350 Nonrheumatic aortic (valve) stenosis: Secondary | ICD-10-CM

## 2015-06-03 LAB — CBC WITH DIFFERENTIAL/PLATELET
Basophils Absolute: 0 10*3/uL (ref 0.0–0.1)
Basophils Relative: 0 %
EOS ABS: 0 10*3/uL (ref 0.0–0.7)
EOS PCT: 0 %
HCT: 25.3 % — ABNORMAL LOW (ref 36.0–46.0)
HEMOGLOBIN: 8.3 g/dL — AB (ref 12.0–15.0)
LYMPHS ABS: 0.7 10*3/uL (ref 0.7–4.0)
LYMPHS PCT: 7 %
MCH: 32.9 pg (ref 26.0–34.0)
MCHC: 32.8 g/dL (ref 30.0–36.0)
MCV: 100.4 fL — AB (ref 78.0–100.0)
MONOS PCT: 8 %
Monocytes Absolute: 0.9 10*3/uL (ref 0.1–1.0)
NEUTROS PCT: 85 %
Neutro Abs: 9.5 10*3/uL — ABNORMAL HIGH (ref 1.7–7.7)
Platelets: 164 10*3/uL (ref 150–400)
RBC: 2.52 MIL/uL — AB (ref 3.87–5.11)
RDW: 19.4 % — ABNORMAL HIGH (ref 11.5–15.5)
WBC: 11.1 10*3/uL — AB (ref 4.0–10.5)

## 2015-06-03 LAB — CBC
HEMATOCRIT: 23.3 % — AB (ref 36.0–46.0)
HEMOGLOBIN: 7.5 g/dL — AB (ref 12.0–15.0)
MCH: 33.9 pg (ref 26.0–34.0)
MCHC: 32.2 g/dL (ref 30.0–36.0)
MCV: 105.4 fL — AB (ref 78.0–100.0)
Platelets: 177 10*3/uL (ref 150–400)
RBC: 2.21 MIL/uL — ABNORMAL LOW (ref 3.87–5.11)
RDW: 17.2 % — ABNORMAL HIGH (ref 11.5–15.5)
WBC: 9.3 10*3/uL (ref 4.0–10.5)

## 2015-06-03 LAB — BASIC METABOLIC PANEL
ANION GAP: 12 (ref 5–15)
BUN: 29 mg/dL — ABNORMAL HIGH (ref 6–20)
CHLORIDE: 105 mmol/L (ref 101–111)
CO2: 25 mmol/L (ref 22–32)
Calcium: 8.5 mg/dL — ABNORMAL LOW (ref 8.9–10.3)
Creatinine, Ser: 0.89 mg/dL (ref 0.44–1.00)
GFR calc non Af Amer: 58 mL/min — ABNORMAL LOW (ref >60)
Glucose, Bld: 127 mg/dL — ABNORMAL HIGH (ref 65–99)
Potassium: 4.5 mmol/L (ref 3.5–5.1)
Sodium: 142 mmol/L (ref 135–145)

## 2015-06-03 LAB — PREPARE RBC (CROSSMATCH)

## 2015-06-03 MED ORDER — SENNA 8.6 MG PO TABS
2.0000 | ORAL_TABLET | Freq: Every day | ORAL | Status: DC
  Administered 2015-06-03: 17.2 mg via ORAL
  Filled 2015-06-03: qty 2

## 2015-06-03 MED ORDER — BISACODYL 10 MG RE SUPP: 10.0000 mg | Freq: Every day | RECTAL | Status: DC | PRN

## 2015-06-03 MED ORDER — METOPROLOL TARTRATE 12.5 MG HALF TABLET
12.5000 mg | ORAL_TABLET | Freq: Two times a day (BID) | ORAL | Status: DC
  Administered 2015-06-03 – 2015-06-06 (×6): 12.5 mg via ORAL
  Filled 2015-06-03 (×6): qty 1

## 2015-06-03 MED ORDER — DOCUSATE SODIUM 100 MG PO CAPS
100.0000 mg | ORAL_CAPSULE | Freq: Two times a day (BID) | ORAL | Status: DC
  Administered 2015-06-03 – 2015-06-04 (×3): 100 mg via ORAL
  Filled 2015-06-03 (×3): qty 1

## 2015-06-03 MED ORDER — SODIUM CHLORIDE 0.9 % IV BOLUS (SEPSIS)
500.0000 mL | Freq: Once | INTRAVENOUS | Status: AC
  Administered 2015-06-03: 500 mL via INTRAVENOUS

## 2015-06-03 MED ORDER — SODIUM CHLORIDE 0.9 % IV SOLN
Freq: Once | INTRAVENOUS | Status: AC
  Administered 2015-06-03: 09:00:00 via INTRAVENOUS

## 2015-06-03 MED ORDER — ENSURE ENLIVE PO LIQD
237.0000 mL | Freq: Two times a day (BID) | ORAL | Status: DC
  Administered 2015-06-03 – 2015-06-04 (×2): 237 mL via ORAL

## 2015-06-03 MED ORDER — FERROUS SULFATE 325 (65 FE) MG PO TABS
325.0000 mg | ORAL_TABLET | Freq: Two times a day (BID) | ORAL | Status: DC
  Administered 2015-06-03 – 2015-06-06 (×7): 325 mg via ORAL
  Filled 2015-06-03 (×7): qty 1

## 2015-06-03 NOTE — Progress Notes (Signed)
[facility]  Patient Status: Active (receiving services up to time of hospitalization)  [facility] is providing the following services: RN, PT, OT, ST and HHA  If patient discharges after hours, please call [phone].   Sherryll Burger 06/03/2015, 2:45 PM

## 2015-06-03 NOTE — Progress Notes (Addendum)
[facility] PROGRESS NOTE  Jacqueline [name] ZOX:[id number] DOB: [birth year] DOA: 06/01/2015 PCP: Loreen Freud, DO  Brief Summary  82 y.o. WF PMHx Dementia, Depression, CAD native artery S/P CABG, Paroxysmal AF on Amiodarone no warfarin, Aortic Stenosis, HLD, who presented with hip pain after a fall.  The patient was admitted 2 months ago after a fall with orthostasis, diagnosed with pneumonia and was in rehabilitation until 24 hours before admission. She had just been discharged home to stay with her son and daughter-in-law, when she began to feel lightheaded and "faint", and fell on the ground. There was no LOC. There is no chest pain or palpitations. She had immediate L hip pain and could not walk.  In the ED, the patient was clinically stable and a plain radiograph of the LEFT hip showed a intertrochanteric fracture. She underwent ORIF on 2/19.    Assessment/Plan  Left Hip fracture s/p ORIF with intramedullary nail by Dr. Doneen Poisson on 2/19. -  DVT prophylaxis per orthopedics, currently SCDs and daily full dose aspirin -  Minimize narcotics and sedating medications as tolerated -  PT/OT recommending SNF -  WBAT   Acute blood loss anemia postoperatively -  Transfuse 1 unit PRBC today -  Will need iron started at discharge until follow up with orthopedic surgery in a few weeks  Acute urinary retention, likely medication induced from narcotics -  Minimize narcotics -  Continue prn IN and OUT catheterization -  Start stool softeners to prevent constipation which may worsen retention  Afib, ChADS2Vasc 4. Not on warfarin prior to admission due to falls risk - Continue Amiodarone  - Metoprolol held this morning secondary to hypotension yesterday  -  Tele: NSR  HTN, hypotensive post procedure -  Hold metoprolol this morning and consider resuming at lower dose this evening if blood pressure tolerates -  Hypotension may have contributed to her fall in the first  place  CAD s/p CABG, chest pain free -  Continue aspirin, reduced dose BB, and statin  Mild AS, likely asymptomatic  Syncope, suspect this was orthostatic again, in context of deconditioning and medications  Dementia, stable, continue Namenda 10 mg BID  Severe protein calorie malnutrition -  Regular diet with supplements  Diet:  Regular  Access:  PIV IVF:  Yes until eating well Proph:  SCDs per orthopedic surgery  Code Status: DNR Family Communication: patient alone Disposition Plan: likely to SNF in a day    Consultants:  Cardiology  Orthopedic surgery  Procedures:  ORIF of left hip fracture/IM nail on 2/19 by Dr. Maureen Ralphs  Antibiotics:  Perioperative only   HPI/Subjective:  Patient states that she has had some urinary retention.  She has had some chronic lightheadedness and dyspnea with exertion, but these are not worse postoperatively.  Denies chest pain, nausea, diaphoresis or obvious blood loss.      Objective: Filed Vitals:   06/03/15 8119 06/03/15 0854 06/03/15 0910 06/03/15 1120  BP: 102/47 98/49 113/53 110/65  Pulse: 65 66 65 66  Temp: 98.1 F (36.7 C) 98.7 F (37.1 C) 99.4 F (37.4 C) 98.9 F (37.2 C)  TempSrc: Oral Oral Oral Oral  Resp: Weight:      SpO2: 100% 100% 100% 100%    Intake/Output Summary (Last 24 hours) at 06/03/15 1513 Last data filed at 06/03/15 1120  Gross per 24 hour  Intake  679.5 ml  Output    750 ml  Net  -70.5 ml  Filed Weights   06/01/15 2211  Weight: 47.628 kg (105 lb)   Body mass index is 17.47 kg/(m^2).  Exam:   General:  Pale, thin adult female, No acute distress  HEENT:  NCAT, MMM  Cardiovascular:  RRR, nl S1, S2 no mrg, 2+ pulses, warm extremities  Respiratory:  CTAB, no increased WOB  Abdomen:   NABS, soft, NT/ND  MSK:   Normal tone and bulk, no LEE, mild swelling of left hip, dressing c/d/i  Neuro:  Grossly intact, 2+ pulse, < 2 sec CR, able to wiggle toes on left  foot  Data Reviewed: Basic Metabolic Panel:  Recent Labs Lab 05/31/15 1055 06/01/15 2226 06/03/15 0344  NA 141 141 142  K 4.8 4.9 4.5  CL 105 103 105  CO2 GLUCOSE 117* 175* 127*  BUN 21 26* 29*  CREATININE 0.63 0.86 0.89  CALCIUM 9.4 10.0 8.5*   Liver Function Tests:  Recent Labs Lab 05/31/15 1055 06/01/15 2226  AST 30 52*  ALT 25 38  ALKPHOS 88 98  BILITOT 0.4 0.6  PROT 6.7 6.5  ALBUMIN 3.5 3.2*   No results for input(s): LIPASE, AMYLASE in the last 168 hours. No results for input(s): AMMONIA in the last 168 hours. CBC:  Recent Labs Lab 05/31/15 1055 06/01/15 2226 06/03/15 0344 06/03/15 1429  WBC 7.7 8.7 9.3 11.1*  NEUTROABS 6.3 7.0  --  9.5*  HGB 12.4 12.1 7.5* 8.3*  HCT 37.0 38.2 23.3* 25.3*  MCV 101.1* 105.5* 105.4* 100.4*  PLT 282.0 269 177 164    Recent Results (from the past 240 hour(s))  Surgical pcr screen     Status: None   Collection Time: 06/02/15 10:50 AM  Result Value Ref Range Status   MRSA, PCR NEGATIVE NEGATIVE Final   Staphylococcus aureus NEGATIVE NEGATIVE Final    Comment:        The Xpert SA Assay (FDA approved for NASAL specimens in patients over 82 years of age), is one component of a comprehensive surveillance program.  Test performance has been validated by [facility] for patients greater than or equal to 47 year old. It is not intended to diagnose infection nor to guide or monitor treatment.      Studies: Dg Chest 1 View  06/02/2015  CLINICAL DATA:  82 year old female with hip fracture. Preop radiograph. EXAM: CHEST 1 VIEW COMPARISON:  Chest CT dated 04/25/2015 under radiograph dated 04/17/2015 FINDINGS: Single-view of chest demonstrate an area of increased density in the right lower lung field which may represent atelectatic changes. Pneumonia is not excluded. Linear and platelike atelectasis/ scarring at the left lung base noted. There is no pleural effusion or pneumothorax. Mild cardiomegaly. Median  sternotomy wires and CABG vascular clips. Osteopenia. No acute fracture. IMPRESSION: Small focal right lower lung field opacity may represent atelectasis. Pneumonia is not excluded. Electronically Signed   By: Elgie Collard M.D.   On: 06/02/2015 00:54   Dg Knee 1-2 Views Left  06/02/2015  CLINICAL DATA:  82 year old female with fall and left knee pain. EXAM: LEFT KNEE - 1-2 VIEW COMPARISON:  None. FINDINGS: No acute fracture or dislocation. The bones are osteopenic. There are chronic changes with chondrocalcinosis of the lateral and medial meniscus. No joint effusion. The soft tissues are grossly unremarkable. Multiple surgical clips noted in the soft tissues inferior to the knee. IMPRESSION: No acute fracture or dislocation. Electronically Signed   By: Elgie Collard M.D.   On: 06/02/2015 01:09  Dg C-arm 1-60 Min  06/02/2015  CLINICAL DATA:  ORIF left proximal femur fracture EXAM: DG C-ARM 61-120 MIN; LEFT FEMUR 2 VIEWS COMPARISON:  06/01/2015 left hip radiographs FINDINGS: Fluoroscopy time 49.8 seconds. Provided spot fluoroscopic intraoperative nondiagnostic radiographs of the left femur demonstrate transfixation of a comminuted intratrochanteric left proximal femur fracture by 2 left femoral neck screws with interlocking left femoral shaft intra medullary rod. IMPRESSION: Intraoperative fluoroscopic guidance for ORIF left proximal femur fracture. Electronically Signed   By: Delbert Phenix M.D.   On: 06/02/2015 14:22   Dg Hip Unilat With Pelvis 2-3 Views Left  06/02/2015  CLINICAL DATA:  82 year old female with fall and left hip pain. EXAM: DG HIP (WITH OR WITHOUT PELVIS) 2-3V LEFT COMPARISON:  Radiograph dated 04/17/2015 FINDINGS: There is advanced osteopenia which limits evaluation for fracture. There is intertrochanteric fracture of the left femur with mild proximal migration of the femoral shaft in relation to the head of the femur and mild impaction. The left femoral head remains in anatomic  alignment with the acetabulum. There is a right hip arthroplasty. No dislocation. Probable old healed pubic bone fractures. IMPRESSION: Intertrochanteric fracture of the left femur. Electronically Signed   By: Elgie Collard M.D.   On: 06/02/2015 01:06   Dg Femur Min 2 Views Left  06/02/2015  CLINICAL DATA:  ORIF left proximal femur fracture EXAM: DG C-ARM 61-120 MIN; LEFT FEMUR 2 VIEWS COMPARISON:  06/01/2015 left hip radiographs FINDINGS: Fluoroscopy time 49.8 seconds. Provided spot fluoroscopic intraoperative nondiagnostic radiographs of the left femur demonstrate transfixation of a comminuted intratrochanteric left proximal femur fracture by 2 left femoral neck screws with interlocking left femoral shaft intra medullary rod. IMPRESSION: Intraoperative fluoroscopic guidance for ORIF left proximal femur fracture. Electronically Signed   By: Delbert Phenix M.D.   On: 06/02/2015 14:22    Scheduled Meds: . amiodarone  200 mg Oral Daily  . aspirin EC  325 mg Oral Q breakfast  . atorvastatin  40 mg Oral q1800  . feeding supplement (ENSURE ENLIVE)  237 mL Oral BID BM  . folic acid  1 mg Oral Daily  . memantine  10 mg Oral BID  . metoprolol tartrate  12.5 mg Oral BID   Continuous Infusions: . sodium chloride 50 mL/hr at 06/03/15 1122    Principal Problem:   Closed intertrochanteric fracture of left femur (HCC) Active Problems:   HTN (hypertension)   CAD (coronary artery disease)   Paroxysmal atrial fibrillation (HCC)   Aortic stenosis   Protein calorie malnutrition (HCC)   Hip fracture (HCC)   PAF (paroxysmal atrial fibrillation) (HCC)   Hypertensive heart disease   Closed left hip fracture (HCC)   Surgery, elective   Protein-calorie malnutrition, severe    Time spent: 30 min    Jeremiah Tarpley  [facility] Pager [phone]. If 7PM-7AM, please contact night-coverage at [email or web address], password [email or web address] 06/03/2015, 3:13 PM  LOS: 1 day

## 2015-06-03 NOTE — Progress Notes (Signed)
Initial Nutrition Assessment  DOCUMENTATION CODES:   Severe malnutrition in context of chronic illness, Underweight  INTERVENTION:  Provide Ensure Enlive po BID, each supplement provides 350 kcal and 20 grams of protein.  Encourage adequate PO intake.  NUTRITION DIAGNOSIS:   Malnutrition related to chronic illness as evidenced by severe depletion of body fat, severe depletion of muscle mass, percent weight loss.  GOAL:   Patient will meet greater than or equal to 90% of their needs  MONITOR:   PO intake, Supplement acceptance, Weight trends, Labs, I & O's  REASON FOR ASSESSMENT:   Consult Assessment of nutrition requirement/status  ASSESSMENT:   82 y.o. WF PMHx Dementia, Depression, CAD native artery S/P CABG, Paroxysmal AF on Amiodarone no warfarin, Aortic Stenosis, HLD, who presents with hip pain after a fall.  Procedure(2/19): INTRAMEDULLARY (IM) NAIL INTERTROCHANTRIC (Left)  Pt reports a decreased appetite. No percent meal completion recorded, however pt reports 25% this AM. Pt reports prior to admission still consuming at least 3 meals a day with an Ensure once daily. Per Epic weight records, pt with a 7.8% weight loss in 1 month. Pt unaware of weight lost. RD to order Ensure to aid in caloric and protein needs. Pt encouraged to eat her food at meals.   Nutrition-Focused physical exam completed. Findings are severe fat depletion, moderate to severe muscle depletion, and no edema.   Labs and medications reviewed.   Diet Order:  Diet regular Room service appropriate?: Yes; Fluid consistency:: Thin  Skin:   (Incision on L thigh)  Last BM:  2/17  Height:   Ht Readings from Last 1 Encounters:  05/31/15  (1.651 m)    Weight:   Wt Readings from Last 1 Encounters:  06/01/15 105 lb (47.628 kg)    Ideal Body Weight:  56.8 kg  BMI:  Body mass index is 17.47 kg/(m^2).  Estimated Nutritional Needs:   Kcal:  1500-1700  Protein:  65-75 grams  Fluid:   >/= 1.5 L/day  EDUCATION NEEDS:   No education needs identified at this time  Roslyn Smiling, MS, RD, LDN Pager # [phone] After hours/ weekend pager # [phone]

## 2015-06-03 NOTE — Op Note (Signed)
Jacqueline [name], Jacqueline [name] NO.:  [id number]  MEDICAL RECORD NO.:  [id number]  LOCATION:  [facility]                         FACILITY:  [facility]  PHYSICIAN:  Vanita Panda. Magnus Ivan, M.D.DATE OF BIRTH:  [birth year]  DATE OF PROCEDURE:  06/01/2015 DATE OF DISCHARGE:                              OPERATIVE REPORT   PREOPERATIVE DIAGNOSIS:  Left displaced unstable intertrochanteric femur/hip fracture.  POSTOPERATIVE DIAGNOSIS:  Left displaced unstable intertrochanteric femur/hip fracture.  PROCEDURE:  Open reduction and internal fixation of left intertrochanteric hip fracture.  IMPLANTS:  [employer] 10 x 38 left Intertan femoral nail with a 90/85 dual lag screw.  SURGEON:  Vanita Panda. Magnus Ivan, M.D.  ANESTHESIA:  Spinal.  BLOOD LOSS:  Less than 200 mL.  ANTIBIOTICS:  2 g IV Ancef.  COMPLICATIONS:  None.  INDICATIONS:  Jacqueline [name] is an 82 year old female, who has had a series of mechanical falls after a syncopal episode or dizziness.  She sustained one last evening, and was brought to the emergency room and found to have a left displaced intertrochanteric femur/hip fracture.  She was admitted to the Medicine Service, and also seen by Cardiology for clearance for surgery.  I talked to her and her son about the reasons behind proceeding with surgery as well as the risks and benefits, and they did feel the need to proceed which I agree with given her severe pain with this hip and displaced nature of it.  PROCEDURE DESCRIPTION:  After informed consent was obtained and appropriate clearance had been obtained, she was brought to the operating room and spinal anesthesia was obtained while she was on her stretcher.  She was then placed supine on the fracture table with her left hip in in-line skeletal traction.  A perineal post in place and her right hip flexed and abducted out of the field with an appropriate abduction stirrup and padding in the popliteal area.   We then were able to assess her left hip under direct fluoroscopy, and reduced the fracture.  We then chose our femoral nail keeping it sterile in the box, placing on top of the leg for measuring a 10 x 38 left Intertan femoral nail from [employer]  We then prepped her left hip with DuraPrep and sterile drapes.  Time-out was called and she was identified as the correct patient and correct left hip.  We then made an incision in line with the greater trochanter just proximal to this and dissected down to the greater trochanter.  A temporary guide pin was then inserted under direct fluoroscopy in an antegrade fashion using the __________ femoral canal and removed the guide pin and passed the rod down the femoral canal without difficulty.  Using the outrigger guide, we made a separate lateral incision and then drilled and took measurements for a 90/85 dual lag screw, which we then placed without difficulty.  We placed a compression component as well. After letting traction off the bed, I then put the hip through internal and external rotation.  Things proceeded nicely.  We irrigated both wounds in normal saline solution and closed the deep tissue with 0 Vicryl, followed by 2-0 Vicryl for subcutaneous tissue, and interrupted staples on the skin.  Xeroform and well-padded sterile dressing was applied.  She was taken off the fracture table to recovery room in stable condition.  All final counts were correct.  There were no complications noted.     Vanita Panda. Magnus Ivan, M.D.     CYB/MEDQ  D:  06/02/2015  T:  06/02/2015  Job:  [id number]

## 2015-06-03 NOTE — Progress Notes (Signed)
Subjective: 1 Day Post-Op Procedure(s) (LRB): INTRAMEDULLARY (IM) NAIL INTERTROCHANTRIC (Left) Patient reports pain as moderate.  Awake and alert eating breakfast. Appears comfortable.   Objective: Vital signs in last 24 hours: Temp:  [96.2 F (35.7 C)-98.5 F (36.9 C)] 98.5 F (36.9 C) (02/20 0742) Pulse Rate:  [49-71] 71 (02/20 0742) Resp:  [8-21] 16 (02/20 0742) BP: (74-142)/(43-83) 115/64 mmHg (02/20 0742) SpO2:  [96 %-100 %] 100 % (02/20 0742) Arterial Line BP: (100-128)/(44-57) 100/44 mmHg (02/19 1453)  Intake/Output from previous day: 02/19 0701 - 02/20 0700 In: 687.5 [I.V.:587.5; IV Piggyback:100] Out: 850 [Urine:750; Blood:100] Intake/Output this shift:     Recent Labs  05/31/15 1055 06/01/15 2226 06/03/15 0344  HGB 12.4 12.1 7.5*    Recent Labs  06/01/15 2226 06/03/15 0344  WBC 8.7 9.3  RBC 3.62* 2.21*  HCT 38.2 23.3*  PLT 269 177    Recent Labs  06/01/15 2226 06/03/15 0344  NA 141 142  K 4.9 4.5  CL 103 105  CO2 27 25  BUN 26* 29*  CREATININE 0.86 0.89  GLUCOSE 175* 127*  CALCIUM 10.0 8.5*    Recent Labs  06/02/15 0044  INR 1.15   Left lower extremity:  Dorsiflexion/Plantar flexion intact Incision: dressing C/D/I Compartment soft  Assessment/Plan: 1 Day Post-Op Procedure(s) (LRB): INTRAMEDULLARY (IM) NAIL INTERTROCHANTRIC (Left)  Post-op anemia secondary to surgery transfusion of PRBC'S ordered by primary team.  Up with therapy WBAT left lower extremity after blood transfusion  Richardean Canal 06/03/2015, 7:47 AM

## 2015-06-03 NOTE — Progress Notes (Signed)
Pt had not voided all shift; MD notified and new order received for foley. Foley inserted per order. Dionne Bucy RN

## 2015-06-03 NOTE — Evaluation (Signed)
Physical Therapy Evaluation Patient Details Name: Jacqueline [name] MRN: [id number] DOB: [birth year] Today's Date: 06/03/2015   History of Present Illness  82 yo admitted after fall at home less than 24 hrs home from SNF. PMHx: CAD, CABG, AFib, HTN, GERD  Clinical Impression  Pt pleasant and very surprised she is allowed to mobilize. Pt with decreased strength, ROM, transfers, gait and function who has limited assist at home and currently requires 2 person assist for transfers. Pt will benefit from acute therapy to maximize mobility, function, gait and independence to decrease burden of care.     Follow Up Recommendations SNF;Supervision/Assistance - 24 hour    Equipment Recommendations  None recommended by PT    Recommendations for Other Services OT consult     Precautions / Restrictions Precautions Precautions: Fall Restrictions Weight Bearing Restrictions: Yes LLE Weight Bearing: Weight bearing as tolerated      Mobility  Bed Mobility Overal bed mobility: Needs Assistance Bed Mobility: Supine to Sit     Supine to sit: Max assist;+2 for physical assistance     General bed mobility comments: assist with pad to pivot to EOB with cues for sequence and max assist to maneuver lower body with assist to raise trunk from surface as well  Transfers Overall transfer level: Needs assistance   Transfers: Sit to/from Stand;Stand Pivot Transfers Sit to Stand: Min assist;+2 safety/equipment Stand pivot transfers: Mod assist;+2 safety/equipment;+2 physical assistance       General transfer comment: min assist to stand from surface with cues for hand placement and safety. Pt with difficulty with weight shifting and bearing weight on LLE with partial buckling with initial step. 2 person mod assist for safety and sequence with pivot with use of RW  Ambulation/Gait                Stairs            Wheelchair Mobility    Modified Rankin (Stroke Patients Only)        Balance Overall balance assessment: Needs assistance   Sitting balance-Leahy Scale: Fair       Standing balance-Leahy Scale: Poor                               Pertinent Vitals/Pain Pain Assessment: 0-10 Pain Score: 5  Pain Location: left hip Pain Descriptors / Indicators: Aching Pain Intervention(s): Limited activity within patient's tolerance;Premedicated before session;Repositioned    Home Living Family/patient expects to be discharged to:: Private residence Living Arrangements: Children Available Help at Discharge: Family;Available PRN/intermittently Type of Home: House Home Access: Stairs to enter Entrance Stairs-Rails: Doctor, general practice of Steps: 5 Home Layout: One level Home Equipment: Bedside commode;Shower seat;Grab bars - tub/shower;Walker - 4 wheels      Prior Function Level of Independence: Needs assistance   Gait / Transfers Assistance Needed: Independently used 4 wheeled RW  ADL's / Homemaking Assistance Needed: Pt reports she has been completing ADLs independently since return home  Comments: Pt was living with family then went to an ALF, fell while there and went to SNF less than 24 hrs home fell and admitted with current hip fx     Hand Dominance        Extremity/Trunk Assessment   Upper Extremity Assessment: Generalized weakness           Lower Extremity Assessment: Generalized weakness      Cervical / Trunk Assessment: Kyphotic  Communication  Communication: No difficulties  Cognition Arousal/Alertness: Awake/alert Behavior During Therapy: WFL for tasks assessed/performed Overall Cognitive Status: Within Functional Limits for tasks assessed                      General Comments      Exercises General Exercises - Lower Extremity Heel Slides: AROM;AAROM;Left;Right;10 reps;Seated (AAROM on LLE) Hip ABduction/ADduction: AROM;AAROM;Right;Left;10 reps;Seated (AAROM on LLE)       Assessment/Plan    PT Assessment Patient needs continued PT services  PT Diagnosis Difficulty walking;Generalized weakness;Acute pain   PT Problem List Decreased strength;Decreased range of motion;Decreased activity tolerance;Decreased balance;Decreased mobility;Pain;Decreased knowledge of use of DME  PT Treatment Interventions Gait training;DME instruction;Functional mobility training;Therapeutic activities;Therapeutic exercise;Balance training;Patient/family education   PT Goals (Current goals can be found in the Care Plan section) Acute Rehab PT Goals Patient Stated Goal: be able to walk PT Goal Formulation: With patient Time For Goal Achievement: 06/17/15 Potential to Achieve Goals: Fair    Frequency Min 3X/week   Barriers to discharge Decreased caregiver support      Co-evaluation               End of Session Equipment Utilized During Treatment: Gait belt Activity Tolerance: Patient tolerated treatment well Patient left: in chair;with call bell/phone within reach;with chair alarm set Nurse Communication: Mobility status;Weight bearing status         Time: 1014-1030 PT Time Calculation (min) (ACUTE ONLY): 16 min   Charges:   PT Evaluation $PT Eval Moderate Complexity: 1 Procedure     PT G CodesDelorse Lek 06/03/2015, 10:38 AM Delaney Meigs, PT [phone]

## 2015-06-03 NOTE — Progress Notes (Signed)
Patient Name: Jacqueline [name] Date of Encounter: 06/03/2015   Patient Profile   82 y/o ? with a h/o CAD s/p CABG, HTN, PAF on amio, and mild AS, who was admitted following fall and L femur Fx s/p INTRAMEDULLARY (IM) NAIL INTERTROCHANTRIC.        SUBJECTIVE  No chest pain or sob. No complains.   CURRENT MEDS . amiodarone  200 mg Oral Daily  . aspirin EC  325 mg Oral Q breakfast  . atorvastatin  40 mg Oral q1800  . folic acid  1 mg Oral Daily  . memantine  10 mg Oral BID  . metoprolol tartrate  12.5 mg Oral BID    OBJECTIVE  Filed Vitals:   06/03/15 0742 06/03/15 0822 06/03/15 0854 06/03/15 0910  BP: 115/64 102/47 98/49 113/53  Pulse: 71 65 66 65  Temp: 98.5 F (36.9 C) 98.1 F (36.7 C) 98.7 F (37.1 C) 99.4 F (37.4 C)  TempSrc: Oral Oral Oral Oral  Resp: Weight:      SpO2: 100% 100% 100% 100%    Intake/Output Summary (Last 24 hours) at 06/03/15 0935 Last data filed at 06/03/15 0910  Gross per 24 hour  Intake  719.5 ml  Output    850 ml  Net -130.5 ml   Filed Weights   06/01/15 2211  Weight: 105 lb (47.628 kg)    PHYSICAL EXAM  General: Pleasant frail woman in NAD. Neuro: Alert and oriented X 3. Moves all extremities spontaneously. Psych: Normal affect. HEENT:  Normal  Neck: Supple without bruits or JVD. Lungs:  Resp regular and unlabored, CTA. Heart: RRR no s3, s4. III/VI SE murmurs. Abdomen: Soft, non-tender, non-distended, BS + x 4.  Extremities: No clubbing, cyanosis or edema. DP 1+ and equal bilaterally.  Accessory Clinical Findings  CBC  Recent Labs  05/31/15 1055 06/01/15 2226 06/03/15 0344  WBC 7.7 8.7 9.3  NEUTROABS 6.3 7.0  --   HGB 12.4 12.1 7.5*  HCT 37.0 38.2 23.3*  MCV 101.1* 105.5* 105.4*  PLT 282.0 269 177   Basic Metabolic Panel  Recent Labs  06/01/15 2226 06/03/15 0344  NA 141 142  K 4.9 4.5  CL 103 105  CO2 27 25  GLUCOSE 175* 127*  BUN 26* 29*  CREATININE 0.86 0.89  CALCIUM 10.0  8.5*   Liver Function Tests  Recent Labs  05/31/15 1055 06/01/15 2226  AST 30 52*  ALT 25 38  ALKPHOS 88 98  BILITOT 0.4 0.6  PROT 6.7 6.5  ALBUMIN 3.5 3.2*   Fasting Lipid Panel  Recent Labs  05/31/15 1055  CHOL 93  HDL 42.70  LDLCALC 34  TRIG 82.0  CHOLHDL 2    TELE  Sinus rhythm   Radiology/Studies  Dg Chest 1 View  06/02/2015  CLINICAL DATA:  82 year old female with hip fracture. Preop radiograph. EXAM: CHEST 1 VIEW COMPARISON:  Chest CT dated 04/25/2015 under radiograph dated 04/17/2015 FINDINGS: Single-view of chest demonstrate an area of increased density in the right lower lung field which may represent atelectatic changes. Pneumonia is not excluded. Linear and platelike atelectasis/ scarring at the left lung base noted. There is no pleural effusion or pneumothorax. Mild cardiomegaly. Median sternotomy wires and CABG vascular clips. Osteopenia. No acute fracture. IMPRESSION: Small focal right lower lung field opacity may represent atelectasis. Pneumonia is not excluded. Electronically Signed   By: Elgie Collard M.D.   On: 06/02/2015 00:54   Dg Knee 1-2 Views  Left  06/02/2015  CLINICAL DATA:  82 year old female with fall and left knee pain. EXAM: LEFT KNEE - 1-2 VIEW COMPARISON:  None. FINDINGS: No acute fracture or dislocation. The bones are osteopenic. There are chronic changes with chondrocalcinosis of the lateral and medial meniscus. No joint effusion. The soft tissues are grossly unremarkable. Multiple surgical clips noted in the soft tissues inferior to the knee. IMPRESSION: No acute fracture or dislocation. Electronically Signed   By: Elgie Collard M.D.   On: 06/02/2015 01:09   Dg C-arm 1-60 Min  06/02/2015  CLINICAL DATA:  ORIF left proximal femur fracture EXAM: DG C-ARM 61-120 MIN; LEFT FEMUR 2 VIEWS COMPARISON:  06/01/2015 left hip radiographs FINDINGS: Fluoroscopy time 49.8 seconds. Provided spot fluoroscopic intraoperative nondiagnostic radiographs of  the left femur demonstrate transfixation of a comminuted intratrochanteric left proximal femur fracture by 2 left femoral neck screws with interlocking left femoral shaft intra medullary rod. IMPRESSION: Intraoperative fluoroscopic guidance for ORIF left proximal femur fracture. Electronically Signed   By: Delbert Phenix M.D.   On: 06/02/2015 14:22   Dg Hip Unilat With Pelvis 2-3 Views Left  06/02/2015  CLINICAL DATA:  82 year old female with fall and left hip pain. EXAM: DG HIP (WITH OR WITHOUT PELVIS) 2-3V LEFT COMPARISON:  Radiograph dated 04/17/2015 FINDINGS: There is advanced osteopenia which limits evaluation for fracture. There is intertrochanteric fracture of the left femur with mild proximal migration of the femoral shaft in relation to the head of the femur and mild impaction. The left femoral head remains in anatomic alignment with the acetabulum. There is a right hip arthroplasty. No dislocation. Probable old healed pubic bone fractures. IMPRESSION: Intertrochanteric fracture of the left femur. Electronically Signed   By: Elgie Collard M.D.   On: 06/02/2015 01:06   Dg Femur Min 2 Views Left  06/02/2015  CLINICAL DATA:  ORIF left proximal femur fracture EXAM: DG C-ARM 61-120 MIN; LEFT FEMUR 2 VIEWS COMPARISON:  06/01/2015 left hip radiographs FINDINGS: Fluoroscopy time 49.8 seconds. Provided spot fluoroscopic intraoperative nondiagnostic radiographs of the left femur demonstrate transfixation of a comminuted intratrochanteric left proximal femur fracture by 2 left femoral neck screws with interlocking left femoral shaft intra medullary rod. IMPRESSION: Intraoperative fluoroscopic guidance for ORIF left proximal femur fracture. Electronically Signed   By: Delbert Phenix M.D.   On: 06/02/2015 14:22    ASSESSMENT AND PLAN  1. Left intertrochanteric femur Fx: S/p fall after feeling lightheaded while walking. Did not lose consciousness.  - s/p INTRAMEDULLARY (IM) NAIL INTERTROCHANTRIC  (Left)  2. CAD: S/p CABG x 5 in 2014. No recent c/p. Activity limited @ home - uses walker. - Cont asa, bb, statin.  3. PAF: Maintaining sinus. On amio. No anticoagulation 2/2 h/o falls and dementia.   4. Hypertensive Heart Disease: Stable. Follow.  5. HL: On statin.  6. Acute anemia - post op. Blood transfusion currently undergoing.   7. HTN - Relatively stable  Signed, Codee Bloodworth PA-C Pager [phone]

## 2015-06-04 DIAGNOSIS — S72142D Displaced intertrochanteric fracture of left femur, subsequent encounter for closed fracture with routine healing: Secondary | ICD-10-CM

## 2015-06-04 DIAGNOSIS — R197 Diarrhea, unspecified: Secondary | ICD-10-CM

## 2015-06-04 LAB — CBC
HCT: 24.7 % — ABNORMAL LOW (ref 36.0–46.0)
Hemoglobin: 7.8 g/dL — ABNORMAL LOW (ref 12.0–15.0)
MCH: 31.8 pg (ref 26.0–34.0)
MCHC: 31.6 g/dL (ref 30.0–36.0)
MCV: 100.8 fL — ABNORMAL HIGH (ref 78.0–100.0)
PLATELETS: 181 10*3/uL (ref 150–400)
RBC: 2.45 MIL/uL — AB (ref 3.87–5.11)
RDW: 19.8 % — AB (ref 11.5–15.5)
WBC: 8.8 10*3/uL (ref 4.0–10.5)

## 2015-06-04 LAB — BASIC METABOLIC PANEL
ANION GAP: 5 (ref 5–15)
BUN: 29 mg/dL — AB (ref 4–21)
BUN: 29 mg/dL — ABNORMAL HIGH (ref 6–20)
CALCIUM: 8.1 mg/dL — AB (ref 8.9–10.3)
CO2: 25 mmol/L (ref 22–32)
Chloride: 112 mmol/L — ABNORMAL HIGH (ref 101–111)
Creatinine, Ser: 0.64 mg/dL (ref 0.44–1.00)
Creatinine: 0.6 mg/dL (ref 0.5–1.1)
Glucose, Bld: 103 mg/dL — ABNORMAL HIGH (ref 65–99)
POTASSIUM: 4 mmol/L (ref 3.5–5.1)
SODIUM: 142 mmol/L (ref 137–147)
SODIUM: 142 mmol/L (ref 135–145)

## 2015-06-04 NOTE — Clinical Social Work Placement (Signed)
   CLINICAL SOCIAL WORK PLACEMENT  NOTE  Date:  06/04/2015  Patient Details  Name: Jacqueline [name] MRN: [id number] Date of Birth: [birth year]  Clinical Social Work is seeking post-discharge placement for this patient at the Skilled  Nursing Facility level of care (*CSW will initial, date and re-position this form in  chart as items are completed):  Yes   Patient/family provided with [facility] Clinical Social Work Department's list of facilities offering this level of care within the geographic area requested by the patient (or if unable, by the patient's family).  Yes   Patient/family informed of their freedom to choose among providers that offer the needed level of care, that participate in Medicare, Medicaid or managed care program needed by the patient, have an available bed and are willing to accept the patient.      Patient/family informed of [facility]'s ownership interest in [facility] and [facility], as well as of the fact that they are under no obligation to receive care at these facilities.  PASRR submitted to EDS on       PASRR number received on       Existing PASRR number confirmed on 06/04/15     FL2 transmitted to all facilities in geographic area requested by pt/family on 06/04/15     FL2 transmitted to all facilities within larger geographic area on       Patient informed that his/her managed care company has contracts with or will negotiate with certain facilities, including the following:        Yes   Patient/family informed of bed offers received.  Patient chooses bed at       Physician recommends and patient chooses bed at      Patient to be transferred to   on  .  Patient to be transferred to facility by       Patient family notified on   of transfer.  Name of family member notified:        PHYSICIAN       Additional Comment:    _______________________________________________ Rondel Baton, LCSW 06/04/2015, 2:10 PM

## 2015-06-04 NOTE — Clinical Social Work Note (Signed)
Clinical Social Work Assessment  Patient Details  Name: Jacqueline [name] MRN: [id number] Date of Birth: Jan 11, 1929  Date of referral:  06/04/15               Reason for consult:  Facility Placement                Permission sought to share information with:  Facility Medical sales representative, Family Supports Permission granted to share information::  Yes, Verbal Permission Granted  Name::      Jacqueline [name] son)  Scientist, forensic::   (SNFs in [place]- first Development worker, international aid)  Relationship::     Contact Information:     Housing/Transportation Living arrangements for the past 2 months:  Single Family Home, Skilled Nursing Facility Source of Information:  Patient, Adult Children (son) Patient Interpreter Needed:  None Criminal Activity/Legal Involvement Pertinent to Current Situation/Hospitalization:    Significant Relationships:  Adult Children Lives with:    Do you feel safe going back to the place where you live?  No Need for family participation in patient care:  Yes (Comment) (patient waxes and wanes with orientation)  Care giving concerns:  Son concerned that patient cannot get around on her own and insufficient help at home.   Social Worker assessment / plan:  PT recommends SNF at time of discharge.  Patient's orientation waxes and wanes.  CSW contacted patient's son, Jacqueline [name], who reports patient lives with he and his wife.  Patient's son states patient has been to [facility] in the past and seems to do well there and likes it there.    Employment status:  Retired Health and safety inspector:    PT Recommendations:  Skilled Nursing Facility Information / Referral to community resources:  Skilled Nursing Facility  Patient/Family's Response to care:  Patient and family are all agreeable to SNF placement.  Patient/Family's Understanding of and Emotional Response to Diagnosis, Current Treatment, and Prognosis:  Patient and son are both realistic regarding needed therapies at time of discharge.    Emotional Assessment Appearance:  Appears stated age Attitude/Demeanor/Rapport:    Affect (typically observed):  Accepting Orientation:  Fluctuating Orientation (Suspected and/or reported Sundowners) Alcohol / Substance use:  Not Applicable Psych involvement (Current and /or in the community):  No (Comment)  Discharge Needs  Concerns to be addressed:  No discharge needs identified Readmission within the last 30 days:  No Current discharge risk:  None Barriers to Discharge:  No Barriers Identified   Rondel Baton, LCSW 06/04/2015, 2:08 PM

## 2015-06-04 NOTE — Progress Notes (Signed)
Patient ID: Jacqueline [name], female   DOB: 1929/02/10, 82 y.o.   MRN: [id number] Awake and alert.  Has diarrhea.  I changed her left hip dressings and now the incisions are clean with no drainage.  Intact staples.  Very slow mobility with therapy.

## 2015-06-04 NOTE — NC FL2 (Signed)
[place] MEDICAID FL2 LEVEL OF CARE SCREENING TOOL     IDENTIFICATION  Patient Name: Jacqueline [name] Birthdate: 04-14-1928 Sex: female Admission Date (Current Location): 06/01/2015  [facility] and [place] Number:      Facility and Address:  [facility], [address] [place], [place] [address]      Provider Number: [id number]  Attending Physician Name and Address:  Renae Fickle, MD  Relative Name and Phone Number:       Current Level of Care: Hospital Recommended Level of Care: Skilled Nursing Facility Prior Approval Number:    Date Approved/Denied:   PASRR Number: [id number]  Discharge Plan: SNF    Current Diagnoses: Patient Active Problem List   Diagnosis Date Noted  . Protein-calorie malnutrition, severe 06/03/2015  . Closed intertrochanteric fracture of left femur (HCC) 06/02/2015  . Hip fracture (HCC) 06/02/2015  . Falls frequently 06/02/2015  . PAF (paroxysmal atrial fibrillation) (HCC)   . Hypertensive heart disease   . Closed left hip fracture (HCC)   . Surgery, elective   . Community acquired pneumonia 04/26/2015  . Right upper lobe pneumonia 04/25/2015  . Fall 04/23/2015  . Generalized anxiety disorder 01/22/2015  . Anorexia 01/17/2015  . UTI (lower urinary tract infection) 01/16/2015  . Generalized weakness 01/16/2015  . Compression fracture of T12 vertebra (HCC)   . Hyponatremia 10/20/2014  . Pressure ulcer 10/20/2014  . Depression 10/18/2014  . Severe muscle deconditioning 10/18/2014  . Right knee pain 08/20/2014  . Hyperlipidemia 02/05/2014  . Physical exam 02/05/2014  . 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 Allergic rhinitis 10/05/2013  . Loss of weight 10/05/2013  . Protein calorie malnutrition (HCC) 10/05/2013  . SOB (shortness of breath) 08/17/2013  . CAD (coronary artery disease) 01/02/2013  . Paroxysmal atrial fibrillation (HCC) 01/02/2013  . Aortic stenosis 01/02/2013  .  Confusion 11/21/2012  . Pancreatic lesion 11/21/2012  . Constipation, slow transit 11/21/2012  . Cerumen impaction 09/27/2012  . Chest heaviness 08/03/2012  . Abnormal urine odor 08/03/2012  . Insomnia 07/04/2012  . Hearing loss of aging 07/04/2012  . Age-related bone loss 04/28/2012  . HTN (hypertension) 04/28/2012  . Reflux 04/28/2012    Orientation RESPIRATION BLADDER Height & Weight     Self, Time  Normal Continent Weight: 105 lb (47.628 kg) Height:     BEHAVIORAL SYMPTOMS/MOOD NEUROLOGICAL BOWEL NUTRITION STATUS      Continent  (please see dc summary for dietary needs at time of discharge)  AMBULATORY STATUS COMMUNICATION OF NEEDS Skin   Limited Assist Verbally Normal                       Personal Care Assistance Level of Assistance  Dressing, Bathing Bathing Assistance: Limited assistance         Functional Limitations Info             SPECIAL CARE FACTORS FREQUENCY  PT (By licensed PT), OT (By licensed OT)     PT Frequency: daily OT Frequency: daily            Contractures Contractures Info: Not present    Additional Factors Info                  Current Medications (06/04/2015):  This is the current hospital active medication list Current Facility-Administered Medications  Medication Dose Route Frequency Provider Last Rate Last Dose  . 0.9 %  sodium chloride infusion   Intravenous Continuous Vanita Panda  Magnus Ivan, MD 50 mL/hr at 06/03/15 2256    . acetaminophen (TYLENOL) tablet 650 mg  650 mg Oral Q6H PRN Kathryne Hitch, MD   650 mg at 06/02/15 1934   Or  . acetaminophen (TYLENOL) suppository 650 mg  650 mg Rectal Q6H PRN Kathryne Hitch, MD      . amiodarone (PACERONE) tablet 200 mg  200 mg Oral Daily Drema Dallas, MD   200 mg at 06/04/15 1054  . aspirin EC tablet 325 mg  325 mg Oral Q breakfast Kathryne Hitch, MD   325 mg at 06/04/15 0755  . atorvastatin (LIPITOR) tablet 40 mg  40 mg Oral q1800 Alberteen Sam, MD   40 mg at 06/03/15 1657  . bisacodyl (DULCOLAX) suppository 10 mg  10 mg Rectal Daily PRN Renae Fickle, MD      . docusate sodium (COLACE) capsule 100 mg  100 mg Oral BID Renae Fickle, MD   100 mg at 06/04/15 1054  . feeding supplement (ENSURE ENLIVE) (ENSURE ENLIVE) liquid 237 mL  237 mL Oral BID BM Renae Fickle, MD   237 mL at 06/04/15 1055  . ferrous sulfate tablet 325 mg  325 mg Oral BID WC Renae Fickle, MD   325 mg at 06/04/15 0755  . folic acid (FOLVITE) tablet 1 mg  1 mg Oral Daily Alberteen Sam, MD   1 mg at 06/04/15 1057  . HYDROcodone-acetaminophen (NORCO/VICODIN) 5-325 MG per tablet 1-2 tablet  1-2 tablet Oral Q6H PRN Kathryne Hitch, MD   1 tablet at 06/03/15 1657  . memantine (NAMENDA) tablet 10 mg  10 mg Oral BID Alberteen Sam, MD   10 mg at 06/04/15 1054  . menthol-cetylpyridinium (CEPACOL) lozenge 3 mg  1 lozenge Oral PRN Kathryne Hitch, MD       Or  . phenol (CHLORASEPTIC) mouth spray 1 spray  1 spray Mouth/Throat PRN Kathryne Hitch, MD      . methocarbamol (ROBAXIN) tablet 500 mg  500 mg Oral Q6H PRN Kathryne Hitch, MD   500 mg at 06/03/15 1657   Or  . methocarbamol (ROBAXIN) 500 mg in dextrose 5 % 50 mL IVPB  500 mg Intravenous Q6H PRN Kathryne Hitch, MD      . metoCLOPramide (REGLAN) tablet 5 mg  5 mg Oral Q8H PRN Kathryne Hitch, MD       Or  . metoCLOPramide (REGLAN) injection 5 mg  5 mg Intravenous Q8H PRN Kathryne Hitch, MD      . metoprolol tartrate (LOPRESSOR) tablet 12.5 mg  12.5 mg Oral BID Renae Fickle, MD   12.5 mg at 06/04/15 1054  . morphine 2 MG/ML injection 0.5 mg  0.5 mg Intravenous Q2H PRN Kathryne Hitch, MD      . ondansetron [facility]) tablet 4 mg  4 mg Oral Q6H PRN Kathryne Hitch, MD       Or  . ondansetron [facility]) injection 4 mg  4 mg Intravenous Q6H PRN Kathryne Hitch, MD      . polyethylene glycol (MIRALAX / GLYCOLAX) packet 17 g  17  g Oral Daily PRN Alberteen Sam, MD      . senna (SENOKOT) tablet 17.2 mg  2 tablet Oral QHS Renae Fickle, MD   17.2 mg at 06/03/15 2302  . white petrolatum (VASELINE) gel   Topical PRN Alberteen Sam, MD   0.2 application at 06/02/15 1043  Discharge Medications: Please see discharge summary for a list of discharge medications.  Relevant Imaging Results:  Relevant Lab Results:   Additional Information SSN; [id number]  Rondel Baton, LCSW

## 2015-06-04 NOTE — Progress Notes (Signed)
[facility] PROGRESS NOTE  Jacqueline [name]:[id number] DOB: 10-03-1928 DOA: 06/01/2015 PCP: Loreen Freud, DO  Brief Summary  82 y.o. WF PMHx Dementia, Depression, CAD native artery S/P CABG, Paroxysmal AF on Amiodarone no warfarin, Aortic Stenosis, HLD, who presented with hip pain after a fall.  The patient was admitted 2 months ago after a fall with orthostasis, diagnosed with pneumonia and was in rehabilitation until 24 hours before admission. She had just been discharged home to stay with her son and daughter-in-law, when she began to feel lightheaded and "faint", and fell on the ground. There was no LOC. There is no chest pain or palpitations. She had immediate L hip pain and could not walk.  In the ED, the patient was clinically stable and a plain radiograph of the LEFT hip showed a intertrochanteric fracture. She underwent ORIF on 2/19.    Assessment/Plan  Left Hip fracture s/p ORIF with intramedullary nail by Dr. Doneen Poisson on 2/19. -  DVT prophylaxis per orthopedics, currently SCDs and daily full dose aspirin -  Minimize narcotics and sedating medications as tolerated -  PT/OT recommending SNF -  WBAT   Acute blood loss anemia postoperatively -  Transfused 1 unit PRBC 2/20 -  Start iron supplementation  Diarrhea, multiple watery stools this afternoon with some incontinence -  Discontinue prn stool softeners and laxatives -  D/c ensure -  Check C. Diff   Acute urinary retention, likely medication induced from narcotics -  D/c foley and voiding trial today  Afib, ChADS2Vasc 4. Not on warfarin prior to admission due to falls risk - Continue Amiodarone  - Metoprolol held this morning secondary to hypotension yesterday  -  Tele: NSR, d/c telemetry  HTN, blood pressure improved -  Continue reduced dose metoprolol and increase back to  BID if blood pressure continues to rise -  Hypotension may have contributed to her fall in the first place  CAD  s/p CABG, chest pain free -  Continue aspirin, reduced dose BB, and statin  Mild AS, likely asymptomatic  Syncope, suspect this was orthostatic again, in context of deconditioning and medications  Dementia, stable, continue Namenda 10 mg BID  Severe protein calorie malnutrition -  Regular diet with supplements  Diet:  Regular  Access:  PIV IVF:  off Proph:  SCDs per orthopedic surgery  Code Status: DNR Family Communication: patient alone Disposition Plan: likely to SNF tomorrow, awaiting work up for diarrhea and voiding trial after foley removal   Consultants:  Cardiology  Orthopedic surgery  Procedures:  ORIF of left hip fracture/IM nail on 2/19 by Dr. Maureen Ralphs  Antibiotics:  Perioperative only   HPI/Subjective:  Has some abdominal fullness this morning.  Also has some shortness of breath, but denies chest pains.     Objective: Filed Vitals:   06/03/15 0910 06/03/15 1120 06/03/15 2105 06/04/15 0500  BP: 113/53 110/65 100/55 130/70  Pulse: 65 66 72 65  Temp: 99.4 F (37.4 C) 98.9 F (37.2 C) 97.7 F (36.5 C)   TempSrc: Oral Oral Oral Oral  Resp: Weight:      SpO2: 100% 100% 98% 98%    Intake/Output Summary (Last 24 hours) at 06/04/15 1800 Last data filed at 06/04/15 1500  Gross per 24 hour  Intake 2031.67 ml  Output    850 ml  Net 1181.67 ml   Filed Weights   06/01/15 2211  Weight: 47.628 kg (105 lb)   Body mass index is  17.47 kg/(m^2).  Exam:   General:  Pale, thin adult female, nasal canula in plaace  HEENT:  NCAT, MMM  Cardiovascular:  RRR, nl S1, S2 no mrg, 2+ pulses, warm extremities  Respiratory:  Diminished at bilateral bases, no increased WOB  Abdomen:   NABS, soft, moderately distended, nontender  MSK:   Normal tone and bulk, no LEE, moderate swelling of left hip down to the knee, ABD dressing c/d/i  Neuro:  Grossly intact, 2+ pulse, < 2 sec CR, able to wiggle toes on left foot  Data Reviewed: Basic  Metabolic Panel:  Recent Labs Lab 05/31/15 1055 06/01/15 2226 06/03/15 0344 06/04/15 0655  NA 141 141 142 142  K 4.8 4.9 4.5 4.0  CL 105 103 105 112*  CO2 GLUCOSE 117* 175* 127* 103*  BUN 21 26* 29* 29*  CREATININE 0.63 0.86 0.89 0.64  CALCIUM 9.4 10.0 8.5* 8.1*   Liver Function Tests:  Recent Labs Lab 05/31/15 1055 06/01/15 2226  AST 30 52*  ALT 25 38  ALKPHOS 88 98  BILITOT 0.4 0.6  PROT 6.7 6.5  ALBUMIN 3.5 3.2*   No results for input(s): LIPASE, AMYLASE in the last 168 hours. No results for input(s): AMMONIA in the last 168 hours. CBC:  Recent Labs Lab 05/31/15 1055 06/01/15 2226 06/03/15 0344 06/03/15 1429 06/04/15 0655  WBC 7.7 8.7 9.3 11.1* 8.8  NEUTROABS 6.3 7.0  --  9.5*  --   HGB 12.4 12.1 7.5* 8.3* 7.8*  HCT 37.0 38.2 23.3* 25.3* 24.7*  MCV 101.1* 105.5* 105.4* 100.4* 100.8*  PLT 282.0 269 177 164 181    Recent Results (from the past 240 hour(s))  Surgical pcr screen     Status: None   Collection Time: 06/02/15 10:50 AM  Result Value Ref Range Status   MRSA, PCR NEGATIVE NEGATIVE Final   Staphylococcus aureus NEGATIVE NEGATIVE Final    Comment:        The Xpert SA Assay (FDA approved for NASAL specimens in patients over 7 years of age), is one component of a comprehensive surveillance program.  Test performance has been validated by [facility] for patients greater than or equal to 46 year old. It is not intended to diagnose infection nor to guide or monitor treatment.      Studies: No results found.  Scheduled Meds: . amiodarone  200 mg Oral Daily  . aspirin EC  325 mg Oral Q breakfast  . atorvastatin  40 mg Oral q1800  . ferrous sulfate  325 mg Oral BID WC  . folic acid  1 mg Oral Daily  . memantine  10 mg Oral BID  . metoprolol tartrate  12.5 mg Oral BID   Continuous Infusions:    Principal Problem:   Closed intertrochanteric fracture of left femur (HCC) Active Problems:   HTN (hypertension)   CAD  (coronary artery disease)   Paroxysmal atrial fibrillation (HCC)   Aortic stenosis   Protein calorie malnutrition (HCC)   Hip fracture (HCC)   PAF (paroxysmal atrial fibrillation) (HCC)   Hypertensive heart disease   Closed left hip fracture (HCC)   Surgery, elective   Protein-calorie malnutrition, severe    Time spent: 30 min    Daisia Slomski  [facility] Pager [phone]. If 7PM-7AM, please contact night-coverage at [email or web address], password [email or web address] 06/04/2015, 6:00 PM  LOS: 2 days

## 2015-06-04 NOTE — Progress Notes (Signed)
Orthopedic Tech Progress Note Patient Details:  [name] 29-Dec-1928 [id number]  Patient ID: Jacqueline [name], female   DOB: [birth year], 82 y.o.   MRN: [id number]   Saul Fordyce 06/04/2015, 2:18 PMTrapeze bar

## 2015-06-05 DIAGNOSIS — R197 Diarrhea, unspecified: Secondary | ICD-10-CM

## 2015-06-05 LAB — CBC
HEMATOCRIT: 24.3 % — AB (ref 36.0–46.0)
HEMOGLOBIN: 7.7 g/dL — AB (ref 12.0–15.0)
MCH: 32.5 pg (ref 26.0–34.0)
MCHC: 31.7 g/dL (ref 30.0–36.0)
MCV: 102.5 fL — ABNORMAL HIGH (ref 78.0–100.0)
Platelets: 168 10*3/uL (ref 150–400)
RBC: 2.37 MIL/uL — ABNORMAL LOW (ref 3.87–5.11)
RDW: 19.1 % — AB (ref 11.5–15.5)
WBC: 8 10*3/uL (ref 4.0–10.5)

## 2015-06-05 LAB — PREPARE RBC (CROSSMATCH)

## 2015-06-05 MED ORDER — FUROSEMIDE 10 MG/ML IJ SOLN
20.0000 mg | Freq: Once | INTRAMUSCULAR | Status: AC
  Administered 2015-06-05: 20 mg via INTRAVENOUS
  Filled 2015-06-05: qty 2

## 2015-06-05 MED ORDER — TAMSULOSIN HCL 0.4 MG PO CAPS
0.4000 mg | ORAL_CAPSULE | Freq: Every day | ORAL | Status: DC
  Administered 2015-06-05 – 2015-06-06 (×2): 0.4 mg via ORAL
  Filled 2015-06-05 (×2): qty 1

## 2015-06-05 MED ORDER — SODIUM CHLORIDE 0.9 % IV SOLN
Freq: Once | INTRAVENOUS | Status: AC
  Administered 2015-06-05: 30 mL via INTRAVENOUS

## 2015-06-05 MED ORDER — DIPHENHYDRAMINE HCL 50 MG/ML IJ SOLN
25.0000 mg | Freq: Once | INTRAMUSCULAR | Status: AC
  Administered 2015-06-05: 25 mg via INTRAVENOUS
  Filled 2015-06-05: qty 1

## 2015-06-05 MED ORDER — ACETAMINOPHEN 325 MG PO TABS
650.0000 mg | ORAL_TABLET | Freq: Once | ORAL | Status: AC
  Administered 2015-06-05: 650 mg via ORAL
  Filled 2015-06-05: qty 2

## 2015-06-05 MED ORDER — SODIUM CHLORIDE 0.9% FLUSH
3.0000 mL | Freq: Two times a day (BID) | INTRAVENOUS | Status: DC
  Administered 2015-06-06: 3 mL via INTRAVENOUS

## 2015-06-05 MED ORDER — SODIUM CHLORIDE 0.9% FLUSH: 3.0000 mL | INTRAVENOUS | Status: DC | PRN

## 2015-06-05 NOTE — Progress Notes (Signed)
Physical Therapy Treatment Patient Details Name: Jacqueline [name] MRN: [id number] DOB: 1928-08-24 Today's Date: 06/05/2015    History of Present Illness 82 yo admitted after fall at home less than 24 hrs home from SNF. PMHx: CAD, CABG, AFib, HTN, GERD    PT Comments    Patient is making small gains toward mobility goals with ambulation in room this session. Pt led through exercises and tolerated them well. Pt needs reassurance of safety for ambulation due to anxiety about falling but is eager to participate in therapy. Continue to progress as tolerated with anticipated d/c to SNF for further skilled PT services.    Follow Up Recommendations  SNF;Supervision/Assistance - 24 hour     Equipment Recommendations  None recommended by PT    Recommendations for Other Services OT consult     Precautions / Restrictions Precautions Precautions: Fall Restrictions Weight Bearing Restrictions: Yes LLE Weight Bearing: Weight bearing as tolerated    Mobility  Bed Mobility Overal bed mobility: Needs Assistance Bed Mobility: Supine to Sit     Supine to sit: Max assist     General bed mobility comments: assist to elevate trunk into sitting, scoot hips with use of bed pad, and bring L LE to EOB; vc for sequncing and technique  Transfers Overall transfer level: Needs assistance Equipment used: Rolling walker (2 wheeled) Transfers: Sit to/from Stand Sit to Stand: Min assist;Mod assist;+2 physical assistance         General transfer comment: vc for hand placement and technique with assistance to come into standing from EOB and recliner chair  Ambulation/Gait Ambulation/Gait assistance: Min assist;+2 physical assistance Ambulation Distance (Feet): 10 Feet (58ft X 2) Assistive device: Rolling walker (2 wheeled) Gait Pattern/deviations: Step-to pattern;Decreased stance time - left;Decreased step length - right;Trunk flexed (decreased bilat step height)     General Gait Details:  vc for posture, sequencing, and position of RW; assistance to maintain balance for weight shifting and guidance of RW at times   Stairs            Wheelchair Mobility    Modified Rankin (Stroke Patients Only)       Balance Overall balance assessment: Needs assistance Sitting-balance support: Feet supported;Bilateral upper extremity supported Sitting balance-Leahy Scale: Fair     Standing balance support: Bilateral upper extremity supported Standing balance-Leahy Scale: Poor                      Cognition Arousal/Alertness: Awake/alert Behavior During Therapy: WFL for tasks assessed/performed Overall Cognitive Status: Within Functional Limits for tasks assessed                      Exercises General Exercises - Lower Extremity Ankle Circles/Pumps: AROM;Both;10 reps;Seated Heel Slides: AROM;AAROM;Left;10 reps;Seated Hip ABduction/ADduction: AROM;AAROM;Left;10 reps;Seated    General Comments General comments (skin integrity, edema, etc.): pt very anxious about falling again but eager to participate in therapy; needs max reassurance throughout session that she will be safe      Pertinent Vitals/Pain Pain Assessment: 0-10 Pain Score: 5  Pain Location: L hip Pain Descriptors / Indicators: Sore Pain Intervention(s): Limited activity within patient's tolerance;Monitored during session;Repositioned    Home Living                      Prior Function            PT Goals (current goals can now be found in the care plan section) Acute Rehab PT  Goals Patient Stated Goal: be able to walk Progress towards PT goals: Progressing toward goals    Frequency  Min 3X/week    PT Plan Current plan remains appropriate    Co-evaluation             End of Session Equipment Utilized During Treatment: Gait belt Activity Tolerance: Patient tolerated treatment well Patient left: in chair;with call bell/phone within reach;with chair alarm set      Time: 1032-1059 PT Time Calculation (min) (ACUTE ONLY): 27 min  Charges:  $Gait Training: 8-22 mins $Therapeutic Exercise: 8-22 mins                    G Codes:      Derek Mound, PTA Pager: [phone]   06/05/2015, 12:58 PM

## 2015-06-05 NOTE — Progress Notes (Signed)
Conversation held with infection control representative, who was inquiring about stool being obtained for C-Diff. Patient had had 3 loose stools, but had a formed stool which was not adequate for testing.  Dr. Jerral Ralph advised, and infection control concurred, that enteric precautions be discontinued.

## 2015-06-05 NOTE — Clinical Social Work Note (Signed)
CSW spoke with [facility] liaison to confirm bed availability/admission at time of discharge.  Liaison confirms acceptance of patient at time of discharge; however, patient is in her copay days and will SNF will need to make arrangements with family.  CSW provided son's contact information for this discussion.  Disposition: SNF- [facility] via Marijo Sanes, LCSW [phone]  5N1-9; 2S 15-16 and [facility] Licensed Clinical Social Worker

## 2015-06-05 NOTE — Evaluation (Addendum)
Occupational Therapy Evaluation and Discharge Patient Details Name: Jacqueline [name] MRN: [id number] DOB: 11/27/1928 Today's Date: 06/05/2015    History of Present Illness 82 yo admitted after fall at home less than 24 hrs home from SNF S/P ORIF left hip. PMHx: CAD, CABG, AFib, HTN, GERD   Clinical Impression   This 82 yo female admitted with above presents to acute OT with deficits below affecting her ability to care for herself and increasing burden of care on others, she will benefit from continue OT at SNF. We wil D/C from acute OT.    Follow Up Recommendations  SNF    Equipment Recommendations   (TBD at next venue)       Precautions / Restrictions Precautions Precautions: Fall Restrictions Weight Bearing Restrictions: No LLE Weight Bearing: Weight bearing as tolerated      Mobility Bed Mobility Overal bed mobility: Needs Assistance Bed Mobility: Supine to Sit;Sit to Supine     Supine to sit: Max assist Sit to supine: Max assist   General bed mobility comments: A for legs and for trunk  Transfers Overall transfer level: Needs assistance Equipment used: Rolling walker (2 wheeled) Transfers: Sit to/from [employer] Sit to Stand: Mod assist Stand pivot transfers: Mod assist       General transfer comment: A for sequecing     Balance Overall balance assessment: Needs assistance Sitting-balance support: Feet supported;Single extremity supported Sitting balance-Leahy Scale: Poor     Standing balance support: Bilateral upper extremity supported Standing balance-Leahy Scale: Poor Standing balance comment: reliant on RW and A from therapist                            ADL Overall ADL's : Needs assistance/impaired Eating/Feeding: Set up;Supervision/ safety;Bed level   Grooming: Min guard;Sitting   Upper Body Bathing: Min guard;Sitting   Lower Body Bathing: Maximal assistance (mod A sit<>stand)   Upper Body Dressing :  Moderate assistance;Sitting   Lower Body Dressing: Total assistance (Mod A sit<>stand)   Toilet Transfer: Moderate assistance;Stand-pivot;BSC   Toileting- Clothing Manipulation and Hygiene: Total assistance (Mod A sit<>stand)                         Pertinent Vitals/Pain Pain Assessment: 0-10 Pain Score: 4  Pain Location: left hip Pain Descriptors / Indicators: Aching;Sore;Grimacing;Guarding Pain Intervention(s): Monitored during session;Repositioned     Hand Dominance Right   Extremity/Trunk Assessment Upper Extremity Assessment Upper Extremity Assessment: Generalized weakness           Communication Communication Communication: No difficulties   Cognition Arousal/Alertness: Awake/alert Behavior During Therapy: Restless (due to "I need these sheets fixed under me, they are botherig me") Overall Cognitive Status: Impaired/Different from baseline Area of Impairment: Problem solving             Problem Solving: Slow processing;Decreased initiation;Difficulty sequencing;Requires verbal cues;Requires tactile cues (for bed moblitiy and stand pivots)        Exercises Exercises: General Lower Extremity          Home Living Family/patient expects to be discharged to:: Private residence Living Arrangements: Children Available Help at Discharge: Family;Available PRN/intermittently Type of Home: House Home Access: Stairs to enter [employer] of Steps: 5 Entrance Stairs-Rails: Right;Left Home Layout: One level         Bathroom Toilet: Standard     Home Equipment: Bedside commode;Shower seat;Grab bars - tub/shower;Walker - 4 wheels  Prior Functioning/Environment Level of Independence: Needs assistance  Gait / Transfers Assistance Needed: Independently used 4 wheeled RW ADL's / Homemaking Assistance Needed: Pt reports she has been completing ADLs independently since return home   Comments: Pt was living with family then went to  an ALF, fell while there and went to SNF less than 24 hrs home fell and admitted with current hip fx    OT Diagnosis: Generalized weakness;Acute pain   OT Problem List: Decreased strength;Decreased range of motion;Decreased activity tolerance;Impaired balance (sitting and/or standing);Pain;Impaired UE functional use;Decreased knowledge of use of DME or AE;Decreased cognition      OT Goals(Current goals can be found in the care plan section) Acute Rehab OT Goals Patient Stated Goal: to get these sheets fixed under me  OT Frequency:     Barriers to D/C: Decreased caregiver support             End of Session Equipment Utilized During Treatment: Gait belt;Rolling walker Nurse Communication:  (NT: pt did not urinate and pink sacral pressure pad soiled and wet so I removed it)  Activity Tolerance: Patient limited by fatigue Patient left: in bed;with call bell/phone within reach;with chair alarm set   Time: 1500-1513 OT Time Calculation (min): 13 min Charges:  OT General Charges $OT Visit: 1 Procedure OT Evaluation $OT Eval Moderate Complexity: 1 Procedure  Jacqueline [name] [phone] 06/05/2015, 3:36 PM

## 2015-06-05 NOTE — Progress Notes (Signed)
PATIENT DETAILS Name: Jacqueline [name] Age: 82 y.o. Sex: female Date of Birth: 19-Feb-1929 Admit Date: 06/01/2015 Admitting Physician Alberteen Sam, MD AOZ:HYQMVH Laury Axon, DO  Brief Summary 82 y.o. WF PMHx Dementia, Depression, CAD native artery S/P CABG, Paroxysmal AF on Amiodarone no warfarin, Aortic Stenosis, HLD, who presented with hip pain after a fall. She was found to have a left hip fracture, underwent ORIF on 2/19. Postoperative course complicated by perioperative blood loss anemia requiring PRBC transfusion.  Subjective: Some pain at the. operative site.Denies chest pain or shortness of breath.  Assessment/Plan: Principal Problem: Closed intertrochanteric fracture of left femur: Secondary to a mechanical fall, denies syncope. Seen by orthopedics, patient is nows/p ORIF with intramedullary nail by Dr. Doneen Poisson on 2/19.Plan is for SNF on discharge, continue aspirin for DVT prophylaxis, WBAT.  Active Problems: Acute perioperative blood loss anemia: We will transfuse one additional unit of PRBC on 2/22-was transfused 1 unit on 2/20. A CBC stable tomorrow suspect should be ready for discharge.   Diarrhea: Resolved, no indication to check C. difficile PCR.  Patient thinks this was secondary to ensure.  Paroxysmal Atrial fibrillation: reviewed prior cardiology consultation done on 2/19-not on anticoagulation secondary to history of dementia and fall. Chadsvasc of around 4.  CAD status post CABG: Without any chest pain or shortness of breath. Continue aspirin, beta blocker and statin. Follow   Hypertension: Controlled-continue metoprolol.  Acute urinary retention: Continue Flomax-follow to see voiding trial successful. Foley catheter removed on 2/21.  Mild aortic stenosis: Continue outpatient surveillance/follow-up.  History of dementia: Pleasantly confused-continue Namenda.  Severe protein calorie malnutrition: Continue  supplements.  Disposition: Remain inpatient  Antimicrobial agents  See below  Anti-infectives    Start     Dose/Rate Route Frequency Ordered Stop   06/02/15 1930  ceFAZolin (ANCEF) IVPB 2 g/50 mL premix     2 g 100 mL/hr over 30 Minutes Intravenous Every 6 hours 06/02/15 1611 06/03/15 0304   06/02/15 1306  ceFAZolin (ANCEF) 1-5 GM-% IVPB    Comments:  Leandro Reasoner   : cabinet override      06/02/15 1306 06/03/15 0114      DVT Prophylaxis: ASa  Code Status: DNR  Family Communication None at bedside  Procedures: orif 2/19  CONSULTS:  orthopedic surgery  Time spent 30 minutes-Greater than 50% of this time was spent in counseling, explanation of diagnosis, planning of further management, and coordination of care.  MEDICATIONS: Scheduled Meds: . sodium chloride   Intravenous Once  . amiodarone  200 mg Oral Daily  . aspirin EC  325 mg Oral Q breakfast  . atorvastatin  40 mg Oral q1800  . ferrous sulfate  325 mg Oral BID WC  . folic acid  1 mg Oral Daily  . furosemide  20 mg Intravenous Once  . memantine  10 mg Oral BID  . metoprolol tartrate  12.5 mg Oral BID  . tamsulosin  0.4 mg Oral Daily   Continuous Infusions:  PRN Meds:.acetaminophen **OR** acetaminophen, HYDROcodone-acetaminophen, menthol-cetylpyridinium **OR** phenol, methocarbamol **OR** methocarbamol (ROBAXIN)  IV, metoCLOPramide **OR** metoCLOPramide (REGLAN) injection, ondansetron **OR** ondansetron (ZOFRAN) IV, white petrolatum    PHYSICAL EXAM: Vital signs in last 24 hours: Filed Vitals:   06/04/15 1856 06/04/15 2022 06/05/15 0548 06/05/15 1216  BP:  116/59 146/73 137/83  Pulse:  61  69  Temp:  98.6 F (37 C) 97.9 F (36.6 C) 98.4 F (  36.9 C)  TempSrc:    Oral  Resp:  16 15 18   Weight:      SpO2: 99% 97% 95%     Weight change:  Filed Weights   06/01/15 2211  Weight: 47.628 kg (105 lb)   Body mass index is 17.47 kg/(m^2).   Gen Exam: Awake and alert with clear speech.   Neck:  Supple, No JVD.   Chest: B/L Clear.   CVS: S1 S2 Regular, no murmurs.  Abdomen: soft, BS +, non tender, non distended.  Extremities: no edema, lower extremities warm to touch. Neurologic: Non Focal.   Skin: No Rash.  Wounds: N/A.    Intake/Output from previous day:  Intake/Output Summary (Last 24 hours) at 06/05/15 1342 Last data filed at 06/05/15 1216  Gross per 24 hour  Intake   1070 ml  Output      0 ml  Net   1070 ml     LAB RESULTS: CBC  Recent Labs Lab 05/31/15 1055 06/01/15 2226 06/03/15 0344 06/03/15 1429 06/04/15 0655 06/05/15 0703  WBC 7.7 8.7 9.3 11.1* 8.8 8.0  HGB 12.4 12.1 7.5* 8.3* 7.8* 7.7*  HCT 37.0 38.2 23.3* 25.3* 24.7* 24.3*  PLT 282.0 269 177 164 181 168  MCV 101.1* 105.5* 105.4* 100.4* 100.8* 102.5*  MCH  --  33.4 33.9 32.9 31.8 32.5  MCHC 33.5 31.7 32.2 32.8 31.6 31.7  RDW 17.6* 16.8* 17.2* 19.4* 19.8* 19.1*  LYMPHSABS 0.8 1.2  --  0.7  --   --   MONOABS 0.5 0.4  --  0.9  --   --   EOSABS 0.1 0.1  --  0.0  --   --   BASOSABS 0.0 0.0  --  0.0  --   --     Chemistries   Recent Labs Lab 05/31/15 1055 06/01/15 2226 06/03/15 0344 06/04/15 0655  NA 141 141 142 142  K 4.8 4.9 4.5 4.0  CL 105 103 105 112*  CO2 31 27 25 25   GLUCOSE 117* 175* 127* 103*  BUN 21 26* 29* 29*  CREATININE 0.63 0.86 0.89 0.64  CALCIUM 9.4 10.0 8.5* 8.1*    CBG: No results for input(s): GLUCAP in the last 168 hours.  GFR Estimated Creatinine Clearance: 39.3 mL/min (by C-G formula based on Cr of 0.64).  Coagulation profile  Recent Labs Lab 06/02/15 0044  INR 1.15    Cardiac Enzymes No results for input(s): CKMB, TROPONINI, MYOGLOBIN in the last 168 hours.  Invalid input(s): CK  Invalid input(s): POCBNP No results for input(s): DDIMER in the last 72 hours. No results for input(s): HGBA1C in the last 72 hours. No results for input(s): CHOL, HDL, LDLCALC, TRIG, CHOLHDL, LDLDIRECT in the last 72 hours. No results for input(s): TSH, T4TOTAL, T3FREE,  THYROIDAB in the last 72 hours.  Invalid input(s): FREET3 No results for input(s): VITAMINB12, FOLATE, FERRITIN, TIBC, IRON, RETICCTPCT in the last 72 hours. No results for input(s): LIPASE, AMYLASE in the last 72 hours.  Urine Studies No results for input(s): UHGB, CRYS in the last 72 hours.  Invalid input(s): UACOL, UAPR, USPG, UPH, UTP, UGL, UKET, UBIL, UNIT, UROB, ULEU, UEPI, UWBC, URBC, UBAC, CAST, UCOM, BILUA  MICROBIOLOGY: Recent Results (from the past 240 hour(s))  Surgical pcr screen     Status: None   Collection Time: 06/02/15 10:50 AM  Result Value Ref Range Status   MRSA, PCR NEGATIVE NEGATIVE Final   Staphylococcus aureus NEGATIVE NEGATIVE Final    Comment:  The Xpert SA Assay (FDA approved for NASAL specimens in patients over 48 years of age), is one component of a comprehensive surveillance program.  Test performance has been validated by [facility] for patients greater than or equal to 50 year old. It is not intended to diagnose infection nor to guide or monitor treatment.     RADIOLOGY STUDIES/RESULTS: Dg Chest 1 View  06/02/2015  CLINICAL DATA:  82 year old female with hip fracture. Preop radiograph. EXAM: CHEST 1 VIEW COMPARISON:  Chest CT dated 04/25/2015 under radiograph dated 04/17/2015 FINDINGS: Single-view of chest demonstrate an area of increased density in the right lower lung field which may represent atelectatic changes. Pneumonia is not excluded. Linear and platelike atelectasis/ scarring at the left lung base noted. There is no pleural effusion or pneumothorax. Mild cardiomegaly. Median sternotomy wires and CABG vascular clips. Osteopenia. No acute fracture. IMPRESSION: Small focal right lower lung field opacity may represent atelectasis. Pneumonia is not excluded. Electronically Signed   By: Elgie Collard M.D.   On: 06/02/2015 00:54   Dg Knee 1-2 Views Left  06/02/2015  CLINICAL DATA:  82 year old female with fall and left knee pain.  EXAM: LEFT KNEE - 1-2 VIEW COMPARISON:  None. FINDINGS: No acute fracture or dislocation. The bones are osteopenic. There are chronic changes with chondrocalcinosis of the lateral and medial meniscus. No joint effusion. The soft tissues are grossly unremarkable. Multiple surgical clips noted in the soft tissues inferior to the knee. IMPRESSION: No acute fracture or dislocation. Electronically Signed   By: Elgie Collard M.D.   On: 06/02/2015 01:09   Dg C-arm 1-60 Min  06/02/2015  CLINICAL DATA:  ORIF left proximal femur fracture EXAM: DG C-ARM 61-120 MIN; LEFT FEMUR 2 VIEWS COMPARISON:  06/01/2015 left hip radiographs FINDINGS: Fluoroscopy time 49.8 seconds. Provided spot fluoroscopic intraoperative nondiagnostic radiographs of the left femur demonstrate transfixation of a comminuted intratrochanteric left proximal femur fracture by 2 left femoral neck screws with interlocking left femoral shaft intra medullary rod. IMPRESSION: Intraoperative fluoroscopic guidance for ORIF left proximal femur fracture. Electronically Signed   By: Delbert Phenix M.D.   On: 06/02/2015 14:22   Dg Hip Unilat With Pelvis 2-3 Views Left  06/02/2015  CLINICAL DATA:  82 year old female with fall and left hip pain. EXAM: DG HIP (WITH OR WITHOUT PELVIS) 2-3V LEFT COMPARISON:  Radiograph dated 04/17/2015 FINDINGS: There is advanced osteopenia which limits evaluation for fracture. There is intertrochanteric fracture of the left femur with mild proximal migration of the femoral shaft in relation to the head of the femur and mild impaction. The left femoral head remains in anatomic alignment with the acetabulum. There is a right hip arthroplasty. No dislocation. Probable old healed pubic bone fractures. IMPRESSION: Intertrochanteric fracture of the left femur. Electronically Signed   By: Elgie Collard M.D.   On: 06/02/2015 01:06   Dg Femur Min 2 Views Left  06/02/2015  CLINICAL DATA:  ORIF left proximal femur fracture EXAM: DG C-ARM  61-120 MIN; LEFT FEMUR 2 VIEWS COMPARISON:  06/01/2015 left hip radiographs FINDINGS: Fluoroscopy time 49.8 seconds. Provided spot fluoroscopic intraoperative nondiagnostic radiographs of the left femur demonstrate transfixation of a comminuted intratrochanteric left proximal femur fracture by 2 left femoral neck screws with interlocking left femoral shaft intra medullary rod. IMPRESSION: Intraoperative fluoroscopic guidance for ORIF left proximal femur fracture. Electronically Signed   By: Delbert Phenix M.D.   On: 06/02/2015 14:22    Jeoffrey Massed, MD  [facility] Pager:[phone]  If 7PM-7AM, please  contact night-coverage [email or web address] Password [email or web address] 06/05/2015, 1:42 PM   LOS: 3 days

## 2015-06-06 LAB — CBC
HCT: 27.8 % — ABNORMAL LOW (ref 36.0–46.0)
Hemoglobin: 8.9 g/dL — ABNORMAL LOW (ref 12.0–15.0)
MCH: 31.1 pg (ref 26.0–34.0)
MCHC: 32 g/dL (ref 30.0–36.0)
MCV: 97.2 fL (ref 78.0–100.0)
PLATELETS: 194 10*3/uL (ref 150–400)
RBC: 2.86 MIL/uL — AB (ref 3.87–5.11)
RDW: 19.9 % — ABNORMAL HIGH (ref 11.5–15.5)
WBC: 7.4 10*3/uL (ref 4.0–10.5)

## 2015-06-06 LAB — TYPE AND SCREEN
ABO/RH(D): A NEG
Antibody Screen: NEGATIVE
UNIT DIVISION: 0
Unit division: 0

## 2015-06-06 LAB — CBC AND DIFFERENTIAL: WBC: 7.4 10^3/mL

## 2015-06-06 MED ORDER — HYDROCODONE-ACETAMINOPHEN 5-325 MG PO TABS: 1.0000 | ORAL_TABLET | Freq: Four times a day (QID) | ORAL | Status: DC | PRN

## 2015-06-06 MED ORDER — FERROUS SULFATE 325 (65 FE) MG PO TABS: 325.0000 mg | ORAL_TABLET | Freq: Two times a day (BID) | ORAL | Status: DC

## 2015-06-06 MED ORDER — POLYETHYLENE GLYCOL 3350 17 G PO PACK: 17.0000 g | PACK | Freq: Every day | ORAL | Status: DC | PRN

## 2015-06-06 MED ORDER — DIAZEPAM 2 MG PO TABS: 1.0000 mg | ORAL_TABLET | Freq: Two times a day (BID) | ORAL | Status: AC | PRN

## 2015-06-06 MED ORDER — TAMSULOSIN HCL 0.4 MG PO CAPS: 0.4000 mg | ORAL_CAPSULE | Freq: Every day | ORAL | Status: DC

## 2015-06-06 NOTE — Progress Notes (Signed)
Patient ID: Jacqueline [name], female   DOB: October 01, 1928, 82 y.o.   MRN: [id number] Doing well overall.  Mild to moderate pain.  Left hip incisions look good and I changed her dressings.  Going to skilled nursing today.

## 2015-06-06 NOTE — Discharge Instructions (Signed)
Full weight as tolerated left hip.  Can get incisions wet daily in the shower. New dry dressings as needed. Only up with assistance.

## 2015-06-06 NOTE — Progress Notes (Signed)
Pt ready for d/c to SNF today per MD. Report was called to Elease Hashimoto at [facility], all questions answered. Pt had 2 large BMs today, peripheral IV removed. Pt does not have any personal belongings in room besides vaseline gel for lips.. Waiting for transportation via [facility]. Will continue to monitor until that time.  Jacqueline [name]  06/06/2015 2:16 PM

## 2015-06-06 NOTE — Discharge Summary (Signed)
PATIENT DETAILS Name: Jacqueline [name] Age: 82 y.o. Sex: female Date of Birth: 1928/07/07 MRN: [id number]. Admitting Physician: Alberteen Sam, MD WUJ:[id number] Laury Axon, DO  Admit Date: 06/01/2015 Discharge date: 06/06/2015  Recommendations for Outpatient Follow-up:  1. Please repeat CBC in 1 week 2. Please ensure follow-up with orthopedics.   PRIMARY DISCHARGE DIAGNOSIS:  Principal Problem:   Closed intertrochanteric fracture of left femur (HCC) Active Problems:   HTN (hypertension)   CAD (coronary artery disease)   Paroxysmal atrial fibrillation (HCC)   Aortic stenosis   Protein calorie malnutrition (HCC)   Hip fracture (HCC)   PAF (paroxysmal atrial fibrillation) (HCC)   Hypertensive heart disease   Closed left hip fracture (HCC)   Surgery, elective   Protein-calorie malnutrition, severe   Diarrhea      PAST MEDICAL HISTORY: Past Medical History  Diagnosis Date  . Diverticulitis   . Hypertensive heart disease   . Osteoporosis   . CAD (coronary artery disease)     a. 08/2012 Abnl MV;  b. 08/2012 CABGx5: LIMA->LAD, VG->Diag, VG->OM1->OM2, VG->RCA.  Marland Kitchen Dementia   . PAF (paroxysmal atrial fibrillation) (HCC)     a. Rhythm mgmt w/ amio;  b. CHA2DS2VASc = 5-->No anticoagulation 2/2 dementia/falls.  Marland Kitchen HLD (hyperlipidemia)   . GERD (gastroesophageal reflux disease)   . Dyslipidemia   . Constipation   . Anorexia   . UTI (lower urinary tract infection)   . Generalized weakness   . Compression fracture of T12 vertebra (HCC)   . Hyponatremia   . Pressure ulcer   . Depression   . Severe muscle deconditioning   . Right knee pain   . Allergic rhinitis   . Protein calorie malnutrition (HCC)   . Cerumen impaction   . Insomnia   . Hearing loss of aging   . Mild aortic stenosis     a. 08/2012 Echo: EF 60-65%, no rwma, mild AS, mildly dil LA, PASP .    DISCHARGE MEDICATIONS: Current Discharge Medication List    START taking these medications   Details    ferrous sulfate 325 (65 FE) MG tablet Take 1 tablet (325 mg total) by mouth 2 (two) times daily with a meal.    HYDROcodone-acetaminophen (NORCO/VICODIN) 5-325 MG tablet Take 1-2 tablets by mouth every 6 (six) hours as needed for moderate pain. Qty: 30 tablet, Refills: 0    tamsulosin (FLOMAX) 0.4 MG CAPS capsule Take 1 capsule (0.4 mg total) by mouth daily.      CONTINUE these medications which have CHANGED   Details  diazepam (VALIUM) 2 MG tablet Take 0.5 tablets (1 mg total) by mouth every 12 (twelve) hours as needed for anxiety. Qty: 10 tablet, Refills: 0   Associated Diagnoses: Generalized anxiety disorder    polyethylene glycol (MIRALAX / GLYCOLAX) packet Take 17 g by mouth daily as needed for mild constipation.      CONTINUE these medications which have NOT CHANGED   Details  acetaminophen (TYLENOL) 325 MG tablet Take 2 tablets (650 mg total) by mouth every 6 (six) hours as needed for mild pain or headache.    alendronate (FOSAMAX) 70 MG tablet TAKE 1 TABLET BY MOUTH EVERY 7 DAYS. TAKE WITH A FULL GLASS OF WATER ON AN EMPTY STOMACH Qty: 12 tablet, Refills: 1    amiodarone (PACERONE) 200 MG tablet TAKE 1 TABLET BY MOUTH DAILY. Qty: 30 tablet, Refills: 3    aspirin EC 325 MG tablet Take 325 mg by mouth every evening.  atorvastatin (LIPITOR) 40 MG tablet TAKE 1 TABLET BY MOUTH DAILY AT 6 PM Qty: 90 tablet, Refills: 0    folic acid (FOLVITE) 1 MG tablet TAKE 1 TABLET BY MOUTH EVERY DAY Qty: 90 tablet, Refills: 1    LUMIGAN 0.01 % SOLN Place 1 drop into both eyes at bedtime. Refills: 3    memantine (NAMENDA) 10 MG tablet TAKE 1 TABLET (10 MG TOTAL) BY MOUTH 2 (TWO) TIMES DAILY. Qty: 60 tablet, Refills: 9    metoprolol tartrate (LOPRESSOR) 25 MG tablet Take 1 tablet by mouth 2 (two) times daily. Refills: 1    Multiple Vitamin (MULTIVITAMIN WITH MINERALS) TABS tablet Take 0.5 tablets by mouth 2 (two) times daily.      STOP taking these medications     lisinopril  (PRINIVIL,ZESTRIL) 40 MG tablet         ALLERGIES:  No Known Allergies  BRIEF HPI:  See H&P, Labs, Consult and Test reports for all details in brief, 82 y.o. WF PMHx Dementia, Depression, CAD native artery S/P CABG, Paroxysmal AF on Amiodarone no warfarin, Aortic Stenosis, HLD, who presented with hip pain after a fall. She was found to have a left hip fracture, underwent ORIF on 2/19.   CONSULTATIONS:   orthopedic surgery  PERTINENT RADIOLOGIC STUDIES: Dg Chest 1 View  06/02/2015  CLINICAL DATA:  82 year old female with hip fracture. Preop radiograph. EXAM: CHEST 1 VIEW COMPARISON:  Chest CT dated 04/25/2015 under radiograph dated 04/17/2015 FINDINGS: Single-view of chest demonstrate an area of increased density in the right lower lung field which may represent atelectatic changes. Pneumonia is not excluded. Linear and platelike atelectasis/ scarring at the left lung base noted. There is no pleural effusion or pneumothorax. Mild cardiomegaly. Median sternotomy wires and CABG vascular clips. Osteopenia. No acute fracture. IMPRESSION: Small focal right lower lung field opacity may represent atelectasis. Pneumonia is not excluded. Electronically Signed   By: Elgie Collard M.D.   On: 06/02/2015 00:54   Dg Knee 1-2 Views Left  06/02/2015  CLINICAL DATA:  82 year old female with fall and left knee pain. EXAM: LEFT KNEE - 1-2 VIEW COMPARISON:  None. FINDINGS: No acute fracture or dislocation. The bones are osteopenic. There are chronic changes with chondrocalcinosis of the lateral and medial meniscus. No joint effusion. The soft tissues are grossly unremarkable. Multiple surgical clips noted in the soft tissues inferior to the knee. IMPRESSION: No acute fracture or dislocation. Electronically Signed   By: Elgie Collard M.D.   On: 06/02/2015 01:09   Dg C-arm 1-60 Min  06/02/2015  CLINICAL DATA:  ORIF left proximal femur fracture EXAM: DG C-ARM 61-120 MIN; LEFT FEMUR 2 VIEWS COMPARISON:   06/01/2015 left hip radiographs FINDINGS: Fluoroscopy time 49.8 seconds. Provided spot fluoroscopic intraoperative nondiagnostic radiographs of the left femur demonstrate transfixation of a comminuted intratrochanteric left proximal femur fracture by 2 left femoral neck screws with interlocking left femoral shaft intra medullary rod. IMPRESSION: Intraoperative fluoroscopic guidance for ORIF left proximal femur fracture. Electronically Signed   By: Delbert Phenix M.D.   On: 06/02/2015 14:22   Dg Hip Unilat With Pelvis 2-3 Views Left  06/02/2015  CLINICAL DATA:  82 year old female with fall and left hip pain. EXAM: DG HIP (WITH OR WITHOUT PELVIS) 2-3V LEFT COMPARISON:  Radiograph dated 04/17/2015 FINDINGS: There is advanced osteopenia which limits evaluation for fracture. There is intertrochanteric fracture of the left femur with mild proximal migration of the femoral shaft in relation to the head of the femur and mild  impaction. The left femoral head remains in anatomic alignment with the acetabulum. There is a right hip arthroplasty. No dislocation. Probable old healed pubic bone fractures. IMPRESSION: Intertrochanteric fracture of the left femur. Electronically Signed   By: Elgie Collard M.D.   On: 06/02/2015 01:06   Dg Femur Min 2 Views Left  06/02/2015  CLINICAL DATA:  ORIF left proximal femur fracture EXAM: DG C-ARM 61-120 MIN; LEFT FEMUR 2 VIEWS COMPARISON:  06/01/2015 left hip radiographs FINDINGS: Fluoroscopy time 49.8 seconds. Provided spot fluoroscopic intraoperative nondiagnostic radiographs of the left femur demonstrate transfixation of a comminuted intratrochanteric left proximal femur fracture by 2 left femoral neck screws with interlocking left femoral shaft intra medullary rod. IMPRESSION: Intraoperative fluoroscopic guidance for ORIF left proximal femur fracture. Electronically Signed   By: Delbert Phenix M.D.   On: 06/02/2015 14:22     PERTINENT LAB RESULTS: CBC:  Recent Labs   06/05/15 0703 06/06/15 0520  WBC 8.0 7.4  HGB 7.7* 8.9*  HCT 24.3* 27.8*  PLT 168 194   CMET CMP     Component Value Date/Time   NA 142 06/04/2015 0655   NA 140 05/02/2015   K 4.0 06/04/2015 0655   CL 112* 06/04/2015 0655   CO2 25 06/04/2015 0655   GLUCOSE 103* 06/04/2015 0655   BUN 29* 06/04/2015 0655   BUN 17 05/02/2015   CREATININE 0.64 06/04/2015 0655   CREATININE 0.6 05/02/2015   CALCIUM 8.1* 06/04/2015 0655   PROT 6.5 06/01/2015 2226   ALBUMIN 3.2* 06/01/2015 2226   AST 52* 06/01/2015 2226   ALT 38 06/01/2015 2226   ALKPHOS 98 06/01/2015 2226   BILITOT 0.6 06/01/2015 2226   GFRNONAA >60 06/04/2015 0655   GFRAA >60 06/04/2015 0655    GFR Estimated Creatinine Clearance: 39.3 mL/min (by C-G formula based on Cr of 0.64). No results for input(s): LIPASE, AMYLASE in the last 72 hours. No results for input(s): CKTOTAL, CKMB, CKMBINDEX, TROPONINI in the last 72 hours. Invalid input(s): POCBNP No results for input(s): DDIMER in the last 72 hours. No results for input(s): HGBA1C in the last 72 hours. No results for input(s): CHOL, HDL, LDLCALC, TRIG, CHOLHDL, LDLDIRECT in the last 72 hours. No results for input(s): TSH, T4TOTAL, T3FREE, THYROIDAB in the last 72 hours.  Invalid input(s): FREET3 No results for input(s): VITAMINB12, FOLATE, FERRITIN, TIBC, IRON, RETICCTPCT in the last 72 hours. Coags: No results for input(s): INR in the last 72 hours.  Invalid input(s): PT Microbiology: Recent Results (from the past 240 hour(s))  Surgical pcr screen     Status: None   Collection Time: 06/02/15 10:50 AM  Result Value Ref Range Status   MRSA, PCR NEGATIVE NEGATIVE Final   Staphylococcus aureus NEGATIVE NEGATIVE Final    Comment:        The Xpert SA Assay (FDA approved for NASAL specimens in patients over 45 years of age), is one component of a comprehensive surveillance program.  Test performance has been validated by [facility] for patients greater than or  equal to 76 year old. It is not intended to diagnose infection nor to guide or monitor treatment.      BRIEF HOSPITAL COURSE:  Closed intertrochanteric fracture of left femur: Secondary to a mechanical fall, denies syncope. Seen by orthopedics, patient is nows/p ORIF with intramedullary nail by Dr. Doneen Poisson on 2/19.Plan is for SNF on discharge, continue aspirin once daily (per Ortho) for DVT prophylaxis, WBAT. Pleasse ensure follow-up with orthopedics in the next 2  weeks.   Active Problems: Acute perioperative blood loss anemia:  Hospital course complicated by anemia postoperatively, requiring a total of 2 units of PRBC. Hemoglobin on discharge stable at 8.9. Please recheck hemoglobin in 1 week. Continue iron supplementation.   Diarrhea: Resolved, no indication to check C. difficile PCR. Patient thinks this was secondary to ensure.  Paroxysmal Atrial fibrillation: reviewed prior cardiology consultation done on 2/19-not on anticoagulation secondary to history of dementia and fall. Chadsvasc of around 4. Continue aspirin and metoprolol.   CAD status post CABG: Without any chest pain or shortness of breath. Continue aspirin, beta blocker and statin. Follow   Hypertension: Controlled-continue metoprolol.  Acute urinary retention:  course postoperatively also complicated by intermittent urinary retention requiring straight in and out catheterization. Per RN approximately 1000 mL of urine drained earlier this morning by straight catheterization. Therefore we will go ahead and place a Foley catheter prior to discharge, we will continue Flomax on discharge. Suggest attempting a voiding trial in 1 week at SNF, and if still unsuccessful-consider urology consultation.   Mild aortic stenosis: Continue outpatient surveillance/follow-up.  History of dementia: Pleasantly confused-continue Namenda.  Severe protein calorie malnutrition  TODAY-DAY OF DISCHARGE:  Subjective:   Jacqueline [name] today has no headache,no chest abdominal pain,no new weakness tingling or numbness, pain is well controlled, she is stable to be transferred to SNF today. Spoke with patient's son over the phone this morning-Steven-explained that pt was ready to be transferred to SNF today.  Objective:   Blood pressure 139/69, pulse 66, temperature 97.3 F (36.3 C), temperature source Oral, resp. rate 18, weight 47.628 kg (105 lb), SpO2 97 %.  Intake/Output Summary (Last 24 hours) at 06/06/15 0830 Last data filed at 06/06/15 0700  Gross per 24 hour  Intake 1313.75 ml  Output   1350 ml  Net -36.25 ml   Filed Weights   06/01/15 2211  Weight: 47.628 kg (105 lb)    Exam Awake Alert, Oriented *3, No new F.N deficits, Normal affect [place].AT,PERRAL Supple Neck,No JVD, No cervical lymphadenopathy appriciated.  Symmetrical Chest wall movement, Good air movement bilaterally, CTAB RRR,No Gallops,Rubs or new Murmurs, No Parasternal Heave +ve B.Sounds, Abd Soft, Non tender, No organomegaly appriciated, No rebound -guarding or rigidity. No Cyanosis, Clubbing or edema, No new Rash or bruise  DISCHARGE CONDITION: Stable  DISPOSITION: SNF  DISCHARGE INSTRUCTIONS:    Activity:  WBAT to left hip Fall precautions  Please request your Primary MD to go over all hospital tests and procedure/radiological results at the follow up, please ask your Primary MD to get all Hospital records sent to his/her office.  If you experience worsening of your admission symptoms, develop shortness of breath, life threatening emergency, suicidal or homicidal thoughts you must seek medical attention immediately by calling 911 or calling your MD immediately  if symptoms less severe.  You must read complete instructions/literature along with all the possible adverse reactions/side effects for all the Medicines you take and that have been prescribed to you. Take any new Medicines after you have completely understood and accpet  all the possible adverse reactions/side effects.   Do not drive when taking Pain medications.   Do not take more than prescribed Pain, Sleep and Anxiety Medications  Special Instructions: If you have smoked or chewed Tobacco  in the last 2 yrs please stop smoking, stop any regular Alcohol  and or any Recreational drug use.  Wear Seat belts while driving.  Please note  You were cared for by  a hospitalist during your hospital stay. Once you are discharged, your primary care physician will handle any further medical issues. Please note that NO REFILLS for any discharge medications will be authorized once you are discharged, as it is imperative that you return to your primary care physician (or establish a relationship with a primary care physician if you do not have one) for your aftercare needs so that they can reassess your need for medications and monitor your lab values.   Diet recommendation: Heart Healthy diet  Discharge Instructions    Call MD for:  redness, tenderness, or signs of infection (pain, swelling, redness, odor or green/yellow discharge around incision site)    Complete by:  As directed      Diet - low sodium heart healthy    Complete by:  As directed      Full weight bearing    Complete by:  As directed   Full weight as tolerated left hip.  Can get incisions wet daily in the shower. New dry dressings as needed.     Increase activity slowly    Complete by:  As directed            Follow-up Information    Follow up with Kathryne Hitch, MD. Schedule an appointment as soon as possible for a visit in 2 weeks.   Specialty:  Orthopedic Surgery   Contact information:   [address] [place] [address] [phone]       Follow up with Loreen Freud, DO. Schedule an appointment as soon as possible for a visit in 1 week.   Specialty:  Family Medicine   Why:  Hospital follow up   Contact information:   [address] [facility] [address] [place]  [address] [phone]      Total Time spent on discharge equals  45 minutes.  SignedJeoffrey Massed 06/06/2015 8:30 AM

## 2015-06-06 NOTE — Care Management Important Message (Signed)
Important Message  Patient Details  Name: Jacqueline [name] MRN: [id number] Date of Birth: 1928-10-09   Medicare Important Message Given:  Yes    [name] P Philip Kotlyar 06/06/2015, 4:35 PM

## 2015-06-07 ENCOUNTER — Non-Acute Institutional Stay (SKILLED_NURSING_FACILITY): Payer: Commercial Managed Care - HMO | Admitting: Adult Health

## 2015-06-07 ENCOUNTER — Encounter: Payer: Self-pay | Admitting: Adult Health

## 2015-06-07 DIAGNOSIS — D62 Acute posthemorrhagic anemia: Secondary | ICD-10-CM

## 2015-06-07 DIAGNOSIS — E46 Unspecified protein-calorie malnutrition: Secondary | ICD-10-CM

## 2015-06-07 DIAGNOSIS — E785 Hyperlipidemia, unspecified: Secondary | ICD-10-CM

## 2015-06-07 DIAGNOSIS — M810 Age-related osteoporosis without current pathological fracture: Secondary | ICD-10-CM | POA: Diagnosis not present

## 2015-06-07 DIAGNOSIS — K5901 Slow transit constipation: Secondary | ICD-10-CM

## 2015-06-07 DIAGNOSIS — F0390 Unspecified dementia without behavioral disturbance: Secondary | ICD-10-CM

## 2015-06-07 DIAGNOSIS — S72002S Fracture of unspecified part of neck of left femur, sequela: Secondary | ICD-10-CM | POA: Diagnosis not present

## 2015-06-07 DIAGNOSIS — I10 Essential (primary) hypertension: Secondary | ICD-10-CM

## 2015-06-07 DIAGNOSIS — F419 Anxiety disorder, unspecified: Secondary | ICD-10-CM

## 2015-06-07 DIAGNOSIS — I480 Paroxysmal atrial fibrillation: Secondary | ICD-10-CM

## 2015-06-07 NOTE — Progress Notes (Signed)
Patient ID: Jacqueline [name], female   DOB: 11-25-1928, 82 y.o.   MRN: [id number]    DATE:  06/07/2015   MRN:  [id number]  BIRTHDAY: 1928-05-07  Facility:  Nursing Home Location:  [facility]  Nursing Home Room Number: 104-P  LEVEL OF CARE:  SNF (31)  Contact Information    Name Relation Home Work [place] Son [phone]  [phone]   Jhordyn, [place] Daughter [phone]  [phone]       Code Status History    Date Active Date Inactive Code Status Order ID Comments User Context   06/02/2015  3:58 AM 06/06/2015  8:12 PM DNR [id number]  Alberteen Sam, MD ED   04/25/2015  9:56 PM 04/30/2015  9:46 PM DNR [id number]  Hillary Bow, DO Inpatient   04/25/2015  9:50 PM 04/25/2015  9:56 PM Full Code [id number]  Hillary Bow, DO Inpatient   01/16/2015 12:46 AM 01/18/2015  4:33 PM DNR [id number]  Lorretta Harp, MD ED   10/20/2014  1:44 PM 10/23/2014  7:22 PM DNR [id number]  Leatha Gilding, MD ED   08/29/2012 11:50 AM 09/02/2012  4:24 PM Full Code [id number]  Alleen Borne, MD Inpatient   08/25/2012  1:43 PM 08/29/2012 11:50 AM Full Code [id number]  Rowe Clack, PA-C Inpatient    Questions for Most Recent Historical Code Status (Order [id number])    Question Answer Comment   In the event of cardiac or respiratory ARREST Do not call a "code blue"    In the event of cardiac or respiratory ARREST Do not perform Intubation, CPR, defibrillation or ACLS    In the event of cardiac or respiratory ARREST Use medication by any route, position, wound care, and other measures to relive pain and suffering. May use oxygen, suction and manual treatment of airway obstruction as needed for comfort.     Advance Directive Documentation        Most Recent Value   Type of Advance Directive  Out of facility DNR (pink MOST or yellow form)   Pre-existing out of facility DNR order (yellow form or pink MOST form)     "MOST" Form in Place?         Chief Complaint  Patient  presents with  . Hospitalization Follow-up    HISTORY OF PRESENT ILLNESS:  This is an 82 year old female who has been admitted to [facility] on 06/07/15 from [facility]. She has PMH of dementia, depression, CAD native artery S/P CABG, paroxysmal A. fib on amiodarone no warfarin,  aortic stenosis and hyperlipidemia. She fell at home and sustained a left hip fracture for which she had ORIF on 06/02/15.  He has been admitted for a short-term rehabilitation.  PAST MEDICAL HISTORY:  Past Medical History  Diagnosis Date  . Diverticulitis   . Hypertensive heart disease   . Osteoporosis   . CAD (coronary artery disease)     a. 08/2012 Abnl MV;  b. 08/2012 CABGx5: LIMA->LAD, VG->Diag, VG->OM1->OM2, VG->RCA.  Marland Kitchen Dementia   . PAF (paroxysmal atrial fibrillation) (HCC)     a. Rhythm mgmt w/ amio;  b. CHA2DS2VASc = 5-->No anticoagulation 2/2 dementia/falls.  Marland Kitchen HLD (hyperlipidemia)   . GERD (gastroesophageal reflux disease)   . Dyslipidemia   . Constipation   . Anorexia   . UTI (lower urinary tract infection)   . Generalized weakness   . Compression fracture of T12 vertebra (HCC)   . Hyponatremia   . Pressure ulcer   .  Depression   . Severe muscle deconditioning   . Right knee pain   . Allergic rhinitis   . Protein calorie malnutrition (HCC)   . Cerumen impaction   . Insomnia   . Hearing loss of aging   . Mild aortic stenosis     a. 08/2012 Echo: EF 60-65%, no rwma, mild AS, mildly dil LA, PASP .     CURRENT MEDICATIONS: Reviewed  Patient's Medications  New Prescriptions   No medications on file  Previous Medications   ACETAMINOPHEN (TYLENOL) 325 MG TABLET    Take 2 tablets (650 mg total) by mouth every 6 (six) hours as needed for mild pain or headache.   ALENDRONATE (FOSAMAX) 70 MG TABLET    TAKE 1 TABLET BY MOUTH EVERY 7 DAYS. TAKE WITH A FULL GLASS OF WATER ON AN EMPTY STOMACH   AMIODARONE (PACERONE) 200 MG TABLET    TAKE 1 TABLET BY MOUTH DAILY.   ASPIRIN EC 325 MG  TABLET    Take 325 mg by mouth every evening.    ATORVASTATIN (LIPITOR) 40 MG TABLET    TAKE 1 TABLET BY MOUTH DAILY AT 6 PM   DIAZEPAM (VALIUM) 2 MG TABLET    Take 0.5 tablets (1 mg total) by mouth every 12 (twelve) hours as needed for anxiety.   FERROUS SULFATE 325 (65 FE) MG TABLET    Take 1 tablet (325 mg total) by mouth 2 (two) times daily with a meal.   FOLIC ACID (FOLVITE) 1 MG TABLET    TAKE 1 TABLET BY MOUTH EVERY DAY   HYDROCODONE-ACETAMINOPHEN (NORCO/VICODIN) 5-325 MG TABLET    Take 1-2 tablets by mouth every 6 (six) hours as needed for moderate pain.   LUMIGAN 0.01 % SOLN    Place 1 drop into both eyes at bedtime.   MEMANTINE (NAMENDA) 10 MG TABLET    TAKE 1 TABLET (10 MG TOTAL) BY MOUTH 2 (TWO) TIMES DAILY.   METOPROLOL TARTRATE (LOPRESSOR) 25 MG TABLET    Take 1 tablet by mouth 2 (two) times daily.   MULTIPLE VITAMINS-MINERALS (CERTAVITE SENIOR/ANTIOXIDANT) TABS    Take 0.5 tablets by mouth 2 (two) times daily.   POLYETHYLENE GLYCOL (MIRALAX / GLYCOLAX) PACKET    Take 17 g by mouth daily as needed for mild constipation.   TAMSULOSIN (FLOMAX) 0.4 MG CAPS CAPSULE    Take 1 capsule (0.4 mg total) by mouth daily.  Modified Medications   No medications on file  Discontinued Medications   MULTIPLE VITAMIN (MULTIVITAMIN WITH MINERALS) TABS TABLET    Take 0.5 tablets by mouth 2 (two) times daily.     No Known Allergies   REVIEW OF SYSTEMS:  GENERAL: no change in appetite, no fatigue, no weight changes, no fever, chills or weakness EYES: Denies change in vision, dry eyes, eye pain, itching or discharge EARS: Denies change in hearing, ringing in ears, or earache NOSE: Denies nasal congestion or epistaxis MOUTH and THROAT: Denies oral discomfort, gingival pain or bleeding, pain from teeth or hoarseness   RESPIRATORY: no cough, SOB, DOE, wheezing, hemoptysis CARDIAC: no chest pain, edema or palpitations GI: no abdominal pain, diarrhea, constipation, heart burn, nausea or  vomiting GU: Denies dysuria, frequency, hematuria, incontinence, or discharge PSYCHIATRIC: Denies feeling of depression or anxiety. No report of hallucinations, insomnia, paranoia, or agitation   PHYSICAL EXAMINATION  GENERAL APPEARANCE: Well nourished. In no acute distress. Normal body habitus SKIN:  Left femur surgical site X 2, has staples and dry dressing, no erythema  HEAD: Normal in size and contour. No evidence of trauma EYES: Lids open and close normally. No blepharitis, entropion or ectropion. PERRL. Conjunctivae are clear and sclerae are white. Lenses are without opacity EARS: Pinnae are normal. Patient hears normal voice tunes of the examiner MOUTH and THROAT: Lips are without lesions. Oral mucosa is moist and without lesions. Tongue is normal in shape, size, and color and without lesions NECK: supple, trachea midline, no neck masses, no thyroid tenderness, no thyromegaly LYMPHATICS: no LAN in the neck, no supraclavicular LAN RESPIRATORY: breathing is even & unlabored, BS CTAB CARDIAC: RRR, no murmur,no extra heart sounds, no edema GI: abdomen soft, normal BS, no masses, no tenderness, no hepatomegaly, no splenomegaly EXTREMITIES:  Able to move X 4 extremities PSYCHIATRIC: Alert and oriented X 3. Affect and behavior are appropriate  LABS/RADIOLOGY: Labs reviewed: Basic Metabolic Panel:  Recent Labs  16/10/96 1138  06/01/15 2226 06/03/15 0344 06/04/15 0655  NA 136  < > 141 142 142  K 4.0  < > 4.9 4.5 4.0  CL 99  < > 103 105 112*  CO2 31  < > GLUCOSE 75  < > 175* 127* 103*  BUN 15  < > 26* 29* 29*  CREATININE 0.66  < > 0.86 0.89 0.64  CALCIUM 9.5  < > 10.0 8.5* 8.1*  MG 1.9  --   --   --   --   < > = values in this interval not displayed. Liver Function Tests:  Recent Labs  04/25/15 1430 05/02/15 05/31/15 1055 06/01/15 2226  AST 112* 32 30 52*  ALT 143* 38* 25 38  ALKPHOS 73 107 88 98  BILITOT 0.6  --  0.4 0.6  PROT 6.3*  --  6.7 6.5  ALBUMIN  2.7*  --  3.5 3.2*    Recent Labs  01/12/15 0750 01/16/15 0245 04/25/15 1430  LIPASE 38 35 53*   CBC:  Recent Labs  05/31/15 1055 06/01/15 2226  06/03/15 1429 06/04/15 0655 06/05/15 0703 06/06/15 0520  WBC 7.7 8.7  < > 11.1* 8.8 8.0 7.4  NEUTROABS 6.3 7.0  --  9.5*  --   --   --   HGB 12.4 12.1  < > 8.3* 7.8* 7.7* 8.9*  HCT 37.0 38.2  < > 25.3* 24.7* 24.3* 27.8*  MCV 101.1* 105.5*  < > 100.4* 100.8* 102.5* 97.2  PLT 282.0 269  < > 164 181 168 194  < > = values in this interval not displayed.  Lipid Panel:  Recent Labs  01/22/15 1138 05/31/15 1055  HDL 49.30 42.70   Cardiac Enzymes:  Recent Labs  01/16/15 0947 01/16/15 1540 04/25/15 1430  TROPONINI <0.03 <0.03 <0.03   CBG:  Recent Labs  04/25/15 1451  GLUCAP 140*      Dg Chest 1 View  06/02/2015  CLINICAL DATA:  82 year old female with hip fracture. Preop radiograph. EXAM: CHEST 1 VIEW COMPARISON:  Chest CT dated 04/25/2015 under radiograph dated 04/17/2015 FINDINGS: Single-view of chest demonstrate an area of increased density in the right lower lung field which may represent atelectatic changes. Pneumonia is not excluded. Linear and platelike atelectasis/ scarring at the left lung base noted. There is no pleural effusion or pneumothorax. Mild cardiomegaly. Median sternotomy wires and CABG vascular clips. Osteopenia. No acute fracture. IMPRESSION: Small focal right lower lung field opacity may represent atelectasis. Pneumonia is not excluded. Electronically Signed   By: Elgie Collard M.D.   On: 06/02/2015 00:54  Dg Knee 1-2 Views Left  06/02/2015  CLINICAL DATA:  82 year old female with fall and left knee pain. EXAM: LEFT KNEE - 1-2 VIEW COMPARISON:  None. FINDINGS: No acute fracture or dislocation. The bones are osteopenic. There are chronic changes with chondrocalcinosis of the lateral and medial meniscus. No joint effusion. The soft tissues are grossly unremarkable. Multiple surgical clips noted in  the soft tissues inferior to the knee. IMPRESSION: No acute fracture or dislocation. Electronically Signed   By: Elgie Collard M.D.   On: 06/02/2015 01:09   Dg C-arm 1-60 Min  06/02/2015  CLINICAL DATA:  ORIF left proximal femur fracture EXAM: DG C-ARM 61-120 MIN; LEFT FEMUR 2 VIEWS COMPARISON:  06/01/2015 left hip radiographs FINDINGS: Fluoroscopy time 49.8 seconds. Provided spot fluoroscopic intraoperative nondiagnostic radiographs of the left femur demonstrate transfixation of a comminuted intratrochanteric left proximal femur fracture by 2 left femoral neck screws with interlocking left femoral shaft intra medullary rod. IMPRESSION: Intraoperative fluoroscopic guidance for ORIF left proximal femur fracture. Electronically Signed   By: Delbert Phenix M.D.   On: 06/02/2015 14:22   Dg Hip Unilat With Pelvis 2-3 Views Left  06/02/2015  CLINICAL DATA:  82 year old female with fall and left hip pain. EXAM: DG HIP (WITH OR WITHOUT PELVIS) 2-3V LEFT COMPARISON:  Radiograph dated 04/17/2015 FINDINGS: There is advanced osteopenia which limits evaluation for fracture. There is intertrochanteric fracture of the left femur with mild proximal migration of the femoral shaft in relation to the head of the femur and mild impaction. The left femoral head remains in anatomic alignment with the acetabulum. There is a right hip arthroplasty. No dislocation. Probable old healed pubic bone fractures. IMPRESSION: Intertrochanteric fracture of the left femur. Electronically Signed   By: Elgie Collard M.D.   On: 06/02/2015 01:06   Dg Femur Min 2 Views Left  06/02/2015  CLINICAL DATA:  ORIF left proximal femur fracture EXAM: DG C-ARM 61-120 MIN; LEFT FEMUR 2 VIEWS COMPARISON:  06/01/2015 left hip radiographs FINDINGS: Fluoroscopy time 49.8 seconds. Provided spot fluoroscopic intraoperative nondiagnostic radiographs of the left femur demonstrate transfixation of a comminuted intratrochanteric left proximal femur fracture by 2  left femoral neck screws with interlocking left femoral shaft intra medullary rod. IMPRESSION: Intraoperative fluoroscopic guidance for ORIF left proximal femur fracture. Electronically Signed   By: Delbert Phenix M.D.   On: 06/02/2015 14:22    ASSESSMENT/PLAN:  Closed intertrochanteric fracture of left femur S/P ORIF - rehabilitation; continue Norco 5/325 mg 1-2 tabs by mouth every 6 hours when necessary and acetaminophen 325 mg take 2 tabs = 650 mg by mouth every 6 hours when necessary for pain; aspirin EC 325 mg 1 tab by mouth daily evening for DVT prophylaxis; LLE WBAT; follow-up with Dr. Magnus Ivan, orthopedic surgeon, in 2 weeks  Hypertension - continue metoprolol titrate 25 mg 1 tab by mouth twice a day  Anemia, acute blood loss - hemoglobin 8.9; continue ferrous sulfate 325 mg 1 tab by mouth twice a day; check CBC  Atrial fibrillation - rate controlled; continue Lopressor 25 mg 1 tab by mouth twice a day, amiodarone 200 mg 1 tab by mouth daily and aspirin EC 325 mg 1 tab by mouth daily evening   Protein calorie malnutrition - albumin 3.2; start Procel 1 scoop by mouth twice a day  Dementia - stable; continue Namenda 10 mg 1 tab by mouth twice a day  Osteoporosis - continue Fosamax 70 mg 1 tab by mouth weekly  Anxiety - mood is stable;  continue Valium to 2 mg 1/2 tab= 1 mg BID PRN  Hyperlipidemia - continue Lipitor 40 mg 1 tab by mouth every 6 p.m.     Goals of care:  Short-term rehabilitation    [facility], NP [facility] [phone]

## 2015-06-09 ENCOUNTER — Other Ambulatory Visit: Payer: Self-pay | Admitting: Family Medicine

## 2015-06-10 ENCOUNTER — Non-Acute Institutional Stay (SKILLED_NURSING_FACILITY): Payer: Commercial Managed Care - HMO | Admitting: Internal Medicine

## 2015-06-10 ENCOUNTER — Encounter: Payer: Self-pay | Admitting: Internal Medicine

## 2015-06-10 DIAGNOSIS — I480 Paroxysmal atrial fibrillation: Secondary | ICD-10-CM

## 2015-06-10 DIAGNOSIS — E785 Hyperlipidemia, unspecified: Secondary | ICD-10-CM | POA: Diagnosis not present

## 2015-06-10 DIAGNOSIS — R2681 Unsteadiness on feet: Secondary | ICD-10-CM

## 2015-06-10 DIAGNOSIS — M810 Age-related osteoporosis without current pathological fracture: Secondary | ICD-10-CM | POA: Diagnosis not present

## 2015-06-10 DIAGNOSIS — S72142D Displaced intertrochanteric fracture of left femur, subsequent encounter for closed fracture with routine healing: Secondary | ICD-10-CM | POA: Diagnosis not present

## 2015-06-10 DIAGNOSIS — F0390 Unspecified dementia without behavioral disturbance: Secondary | ICD-10-CM | POA: Diagnosis not present

## 2015-06-10 DIAGNOSIS — D62 Acute posthemorrhagic anemia: Secondary | ICD-10-CM

## 2015-06-10 DIAGNOSIS — F0150 Vascular dementia without behavioral disturbance: Secondary | ICD-10-CM | POA: Diagnosis not present

## 2015-06-10 DIAGNOSIS — F0391 Unspecified dementia with behavioral disturbance: Secondary | ICD-10-CM

## 2015-06-10 DIAGNOSIS — I10 Essential (primary) hypertension: Secondary | ICD-10-CM

## 2015-06-10 DIAGNOSIS — R5381 Other malaise: Secondary | ICD-10-CM | POA: Diagnosis not present

## 2015-06-10 DIAGNOSIS — E46 Unspecified protein-calorie malnutrition: Secondary | ICD-10-CM

## 2015-06-10 DIAGNOSIS — R339 Retention of urine, unspecified: Secondary | ICD-10-CM | POA: Diagnosis not present

## 2015-06-10 LAB — CBC AND DIFFERENTIAL
HCT: 29 % — AB (ref 36–46)
Hemoglobin: 9.2 g/dL — AB (ref 12.0–16.0)
Neutrophils Absolute: 6 /uL
PLATELETS: 331 10*3/uL (ref 150–399)
WBC: 7.8 10*3/mL

## 2015-06-10 LAB — BASIC METABOLIC PANEL
BUN: 11 mg/dL (ref 4–21)
CREATININE: 0.5 mg/dL (ref 0.5–1.1)
GLUCOSE: 89 mg/dL
Potassium: 3.6 mmol/L (ref 3.4–5.3)
Sodium: 140 mmol/L (ref 137–147)

## 2015-06-10 NOTE — Progress Notes (Signed)
Patient ID: Jacqueline [name], female   DOB: August 06, 1928, 82 y.o.   MRN: [id number]    LOCATION: [facility]  PCP: Loreen Freud, DO   Code Status: DNR  Goals of care: Advanced Directive information Advanced Directives 06/07/2015  Does patient have an advance directive? Yes  Type of Advance Directive Out of facility DNR (pink MOST or yellow form)  Does patient want to make changes to advanced directive? No - Patient declined  Copy of advanced directive(s) in chart? Yes       Extended Emergency Contact Information Primary Emergency Contact: Belknap,Steven Address: [address]           [place], [place] [address] [place] Home Phone: [phone] Mobile Phone: [phone] Relation: Son Secondary Emergency Contact: Edwyna Perfect Address: [address]          [place], [place] [address] [place] Home Phone: [phone] Mobile Phone: [phone] Relation: Daughter   No Known Allergies  Chief Complaint  Patient presents with  . New Admit To SNF    New Admission     HPI:  Patient is a 82 y.o. female seen today for short term rehabilitation post hospital admission from 05/27/15-06/06/15 with left femur closed fracture post fall. She underwent ORIF on 06/02/15. She had acute blood loss anemia and required 2 u prbc transfusion. She also had acute urinary retention and required foley. She has PMH of CAD, HTN, Aortic stenosis, afib, dementia among others. She is seen in her room today. She feels gasey. She had a bowel movement this am. Denies any other concerns. Denies pain.  Review of Systems:  Constitutional: Negative for fever, chills HENT: Negative for headache, congestion, nasal discharge Eyes: Negative for blurred vision, double vision and discharge.  Respiratory: Negative for cough, shortness of breath and wheezing.   Cardiovascular: Negative for chest pain, palpitations.  Gastrointestinal: Negative for heartburn, nausea, vomiting,  abdominal pain. Genitourinary: Negative for flank pain.  Musculoskeletal: Negative for fall in the facility but is a high fall risk Skin: Negative for itching, rash.  Neurological: Negative for dizziness Psychiatric/Behavioral: Negative for depression   Past Medical History  Diagnosis Date  . Diverticulitis   . Hypertensive heart disease   . Osteoporosis   . CAD (coronary artery disease)     a. 08/2012 Abnl MV;  b. 08/2012 CABGx5: LIMA->LAD, VG->Diag, VG->OM1->OM2, VG->RCA.  Marland Kitchen Dementia   . PAF (paroxysmal atrial fibrillation) (HCC)     a. Rhythm mgmt w/ amio;  b. CHA2DS2VASc = 5-->No anticoagulation 2/2 dementia/falls.  Marland Kitchen HLD (hyperlipidemia)   . GERD (gastroesophageal reflux disease)   . Dyslipidemia   . Constipation   . Anorexia   . UTI (lower urinary tract infection)   . Generalized weakness   . Compression fracture of T12 vertebra (HCC)   . Hyponatremia   . Pressure ulcer   . Depression   . Severe muscle deconditioning   . Right knee pain   . Allergic rhinitis   . Protein calorie malnutrition (HCC)   . Cerumen impaction   . Insomnia   . Hearing loss of aging   . Mild aortic stenosis     a. 08/2012 Echo: EF 60-65%, no rwma, mild AS, mildly dil LA, PASP .   Past Surgical History  Procedure Laterality Date  . Fracture surgery    . Partial colectomy    . Coronary artery bypass graft N/A 08/25/2012    Procedure: CORONARY ARTERY BYPASS GRAFTING ;  Surgeon: Alleen Borne,  MD;  Location: [facility] OR;  Service: Open Heart Surgery;  Laterality: N/A;  four bypasses total   . Left heart catheterization with coronary angiogram N/A 08/22/2012    Procedure: LEFT HEART CATHETERIZATION WITH CORONARY ANGIOGRAM;  Surgeon: Peter M Swaziland, MD;  Location: [facility] CATH LAB;  Service: Cardiovascular;  Laterality: N/A;  . Intramedullary (im) nail intertrochanteric Left 06/02/2015    Procedure: INTRAMEDULLARY (IM) NAIL INTERTROCHANTRIC;  Surgeon: Kathryne Hitch, MD;  Location: [facility] OR;   Service: Orthopedics;  Laterality: Left;   Social History:   reports that she has quit smoking. Her smoking use included Cigarettes. She has never used smokeless tobacco. She reports that she does not drink alcohol or use illicit drugs.  Family History  Problem Relation Age of Onset  . Hypertension Other   . Cancer Mother     stomach  . Diabetes Neg Hx   . Heart disease Neg Hx   . Stroke Neg Hx     Medications:   Medication List       This list is accurate as of: 06/10/15 11:43 AM.  Always use your most recent med list.               acetaminophen 325 MG tablet  Commonly known as:  TYLENOL  Take 2 tablets (650 mg total) by mouth every 6 (six) hours as needed for mild pain or headache.     alendronate 70 MG tablet  Commonly known as:  FOSAMAX  TAKE 1 TABLET BY MOUTH EVERY 7 DAYS. TAKE WITH A FULL GLASS OF WATER ON AN EMPTY STOMACH     amiodarone 200 MG tablet  Commonly known as:  PACERONE  TAKE 1 TABLET BY MOUTH DAILY.     aspirin EC 325 MG tablet  Take 325 mg by mouth every evening.     atorvastatin 40 MG tablet  Commonly known as:  LIPITOR  TAKE 1 TABLET BY MOUTH DAILY AT 6 PM     CERTAVITE SENIOR/ANTIOXIDANT Tabs  Take 0.5 tablets by mouth 2 (two) times daily.     diazepam 2 MG tablet  Commonly known as:  VALIUM  Take 0.5 tablets (1 mg total) by mouth every 12 (twelve) hours as needed for anxiety.     ferrous sulfate 325 (65 FE) MG tablet  Take 1 tablet (325 mg total) by mouth 2 (two) times daily with a meal.     folic acid 1 MG tablet  Commonly known as:  FOLVITE  TAKE 1 TABLET BY MOUTH EVERY DAY     HYDROcodone-acetaminophen 5-325 MG tablet  Commonly known as:  NORCO/VICODIN  Take 1-2 tablets by mouth every 6 (six) hours as needed for moderate pain.     LUMIGAN 0.01 % Soln  Generic drug:  bimatoprost  Place 1 drop into both eyes at bedtime.     memantine 10 MG tablet  Commonly known as:  NAMENDA  TAKE 1 TABLET (10 MG TOTAL) BY MOUTH 2 (TWO)  TIMES DAILY.     metoprolol tartrate 25 MG tablet  Commonly known as:  LOPRESSOR  Take 1 tablet by mouth 2 (two) times daily.     polyethylene glycol packet  Commonly known as:  MIRALAX / GLYCOLAX  Take 17 g by mouth daily as needed for mild constipation.     tamsulosin 0.4 MG Caps capsule  Commonly known as:  FLOMAX  Take 1 capsule (0.4 mg total) by mouth daily.        Immunizations: Immunization History  Administered Date(s) Administered  . Influenza,inj,Quad PF,36+ Mos 02/05/2014, 04/16/2015  . Influenza-Unspecified 04/14/2011  . PPD Test 04/30/2015, 05/14/2015, 06/06/2015  . Pneumococcal Conjugate-13 02/05/2014  . Pneumococcal-Unspecified 04/14/2011  . Tdap 04/17/2015     Physical Exam: Filed Vitals:   06/10/15 1136  BP: 124/71  Pulse: 66  Temp: 97.3 F (36.3 C)  TempSrc: Oral  Resp: 18  Height: 5\' 5"  (1.651 m)  Weight: 105 lb (47.628 kg)  SpO2: 94%   Body mass index is 17.47 kg/(m^2).  General- elderly female, frail and thin built, in no acute distress Head- normocephalic, atraumatic Nose- no maxillary or frontal sinus tenderness, no nasal discharge Throat- moist mucus membrane Eyes- PERRLA, EOMI, no pallor, no icterus Neck- no cervical lymphadenopathy Cardiovascular- normal s1,s2, + murmur Respiratory- bilateral clear to auscultation, no wheeze, no rhonchi, no crackles, no use of accessory muscles Abdomen- bowel sounds present, soft, non tender Musculoskeletal- able to move all 4 extremities, limited ROM to left leg Neurological- alert and oriented to person, place and time Skin- warm and dry, bruise underneath both orbits, surgical incision to left hip with dressing in place Psychiatry- normal mood and affect    Labs reviewed: Basic Metabolic Panel:  Recent Labs  45/40/98 1138  06/01/15 2226 06/03/15 0344 06/04/15 06/04/15 0655  NA 136  < > 141 142 142 142  K 4.0  < > 4.9 4.5  --  4.0  CL 99  < > 103 105  --  112*  CO2 31  < > 27 25  --   25  GLUCOSE 75  < > 175* 127*  --  103*  BUN 15  < > 26* 29* 29* 29*  CREATININE 0.66  < > 0.86 0.89 0.6 0.64  CALCIUM 9.5  < > 10.0 8.5*  --  8.1*  MG 1.9  --   --   --   --   --   < > = values in this interval not displayed. Liver Function Tests:  Recent Labs  04/25/15 1430 05/02/15 05/31/15 1055 06/01/15 2226  AST 112* 32 30 52*  ALT 143* 38* 25 38  ALKPHOS 73 107 88 98  BILITOT 0.6  --  0.4 0.6  PROT 6.3*  --  6.7 6.5  ALBUMIN 2.7*  --  3.5 3.2*    Recent Labs  01/12/15 0750 01/16/15 0245 04/25/15 1430  LIPASE 38 35 53*   No results for input(s): AMMONIA in the last 8760 hours. CBC:  Recent Labs  05/31/15 1055 06/01/15 2226  06/03/15 1429 06/04/15 0655 06/05/15 0703 06/06/15 06/06/15 0520  WBC 7.7 8.7  < > 11.1* 8.8 8.0 7.4 7.4  NEUTROABS 6.3 7.0  --  9.5*  --   --   --   --   HGB 12.4 12.1  < > 8.3* 7.8* 7.7*  --  8.9*  HCT 37.0 38.2  < > 25.3* 24.7* 24.3*  --  27.8*  MCV 101.1* 105.5*  < > 100.4* 100.8* 102.5*  --  97.2  PLT 282.0 269  < > 164 181 168  --  194  < > = values in this interval not displayed. Cardiac Enzymes:  Recent Labs  01/16/15 0947 01/16/15 1540 04/25/15 1430  TROPONINI <0.03 <0.03 <0.03   BNP: Invalid input(s): POCBNP CBG:  Recent Labs  04/25/15 1451  GLUCAP 140*    Radiological Exams: Dg Chest 1 View  06/02/2015  CLINICAL DATA:  82 year old female with hip fracture. Preop radiograph. EXAM: CHEST 1 VIEW  COMPARISON:  Chest CT dated 04/25/2015 under radiograph dated 04/17/2015 FINDINGS: Single-view of chest demonstrate an area of increased density in the right lower lung field which may represent atelectatic changes. Pneumonia is not excluded. Linear and platelike atelectasis/ scarring at the left lung base noted. There is no pleural effusion or pneumothorax. Mild cardiomegaly. Median sternotomy wires and CABG vascular clips. Osteopenia. No acute fracture. IMPRESSION: Small focal right lower lung field opacity may represent  atelectasis. Pneumonia is not excluded. Electronically Signed   By: Elgie Collard M.D.   On: 06/02/2015 00:54   Dg Knee 1-2 Views Left  06/02/2015  CLINICAL DATA:  82 year old female with fall and left knee pain. EXAM: LEFT KNEE - 1-2 VIEW COMPARISON:  None. FINDINGS: No acute fracture or dislocation. The bones are osteopenic. There are chronic changes with chondrocalcinosis of the lateral and medial meniscus. No joint effusion. The soft tissues are grossly unremarkable. Multiple surgical clips noted in the soft tissues inferior to the knee. IMPRESSION: No acute fracture or dislocation. Electronically Signed   By: Elgie Collard M.D.   On: 06/02/2015 01:09   Dg C-arm 1-60 Min  06/02/2015  CLINICAL DATA:  ORIF left proximal femur fracture EXAM: DG C-ARM 61-120 MIN; LEFT FEMUR 2 VIEWS COMPARISON:  06/01/2015 left hip radiographs FINDINGS: Fluoroscopy time 49.8 seconds. Provided spot fluoroscopic intraoperative nondiagnostic radiographs of the left femur demonstrate transfixation of a comminuted intratrochanteric left proximal femur fracture by 2 left femoral neck screws with interlocking left femoral shaft intra medullary rod. IMPRESSION: Intraoperative fluoroscopic guidance for ORIF left proximal femur fracture. Electronically Signed   By: Delbert Phenix M.D.   On: 06/02/2015 14:22   Dg Hip Unilat With Pelvis 2-3 Views Left  06/02/2015  CLINICAL DATA:  82 year old female with fall and left hip pain. EXAM: DG HIP (WITH OR WITHOUT PELVIS) 2-3V LEFT COMPARISON:  Radiograph dated 04/17/2015 FINDINGS: There is advanced osteopenia which limits evaluation for fracture. There is intertrochanteric fracture of the left femur with mild proximal migration of the femoral shaft in relation to the head of the femur and mild impaction. The left femoral head remains in anatomic alignment with the acetabulum. There is a right hip arthroplasty. No dislocation. Probable old healed pubic bone fractures. IMPRESSION:  Intertrochanteric fracture of the left femur. Electronically Signed   By: Elgie Collard M.D.   On: 06/02/2015 01:06   Dg Femur Min 2 Views Left  06/02/2015  CLINICAL DATA:  ORIF left proximal femur fracture EXAM: DG C-ARM 61-120 MIN; LEFT FEMUR 2 VIEWS COMPARISON:  06/01/2015 left hip radiographs FINDINGS: Fluoroscopy time 49.8 seconds. Provided spot fluoroscopic intraoperative nondiagnostic radiographs of the left femur demonstrate transfixation of a comminuted intratrochanteric left proximal femur fracture by 2 left femoral neck screws with interlocking left femoral shaft intra medullary rod. IMPRESSION: Intraoperative fluoroscopic guidance for ORIF left proximal femur fracture. Electronically Signed   By: Delbert Phenix M.D.   On: 06/02/2015 14:22    Assessment/Plan  Unsteady gait Will have patient work with PT/OT as tolerated to regain strength and restore function.  Fall precautions are in place.  Physical deconditioning Will have her work with physical therapy and occupational therapy team to help with gait training and muscle strengthening exercises.fall precautions. Skin care. Encourage to be out of bed.   Closed intertrochanteric fracture of left femur  S/P ORIF. WBAT. Has f/u with orthopedics. Continue norco 5-325 mg 1-2 tab q6h prn pain. Continue aspirin EC 325 mg daily for DVT prophylaxis. To work with therapy  team  Urinary retention Has foley in place, do voiding trial on 06/13/15 and if fails, get urology consult. Continue flomax  Blood loss anemia Post op, monitor cbc and continue ferrous sulfate  Osteoporosis Continue fosamax for now and with recent fracture, add oscal with vitamin d bid for now  Protein calorie malnutrition Continue procel supplement, encourage po intake  afib Rate controlled. Continue lopressor 25 mg bid, pacerone and ASA  Hypertension  Stable, monitor BP. continue metoprolol tartrate 25 mg bid  Dementia continue Namenda 10 mg bid, assistance with  ADLs. High fall risk- fall precautions  Hyperlipidemia continue Lipitor 40 mg daily   Goals of care: short term rehabilitation but possible long term care   Labs/tests ordered: cbc, bmp  Family/ staff Communication: reviewed care plan with patient and nursing supervisor    Oneal Grout, MD Internal Medicine [facility] [address] [place], [place] [address] Cell Phone (Monday-Friday 8 am - 5 pm): [phone] On Call: [phone] and follow prompts after 5 pm and on weekends Office Phone: [phone] Office Fax: [phone]

## 2015-07-02 ENCOUNTER — Non-Acute Institutional Stay (SKILLED_NURSING_FACILITY): Payer: Commercial Managed Care - HMO | Admitting: Adult Health

## 2015-07-02 ENCOUNTER — Encounter: Payer: Self-pay | Admitting: Adult Health

## 2015-07-02 DIAGNOSIS — E785 Hyperlipidemia, unspecified: Secondary | ICD-10-CM | POA: Diagnosis not present

## 2015-07-02 DIAGNOSIS — I10 Essential (primary) hypertension: Secondary | ICD-10-CM | POA: Diagnosis not present

## 2015-07-02 DIAGNOSIS — M810 Age-related osteoporosis without current pathological fracture: Secondary | ICD-10-CM | POA: Diagnosis not present

## 2015-07-02 DIAGNOSIS — F419 Anxiety disorder, unspecified: Secondary | ICD-10-CM

## 2015-07-02 DIAGNOSIS — I480 Paroxysmal atrial fibrillation: Secondary | ICD-10-CM

## 2015-07-02 DIAGNOSIS — F0390 Unspecified dementia without behavioral disturbance: Secondary | ICD-10-CM

## 2015-07-02 DIAGNOSIS — K5901 Slow transit constipation: Secondary | ICD-10-CM

## 2015-07-02 DIAGNOSIS — S72142D Displaced intertrochanteric fracture of left femur, subsequent encounter for closed fracture with routine healing: Secondary | ICD-10-CM

## 2015-07-02 DIAGNOSIS — D62 Acute posthemorrhagic anemia: Secondary | ICD-10-CM

## 2015-07-02 DIAGNOSIS — E46 Unspecified protein-calorie malnutrition: Secondary | ICD-10-CM

## 2015-07-02 NOTE — Progress Notes (Signed)
Patient ID: Jacqueline [name], female   DOB: 02/14/1929, 82 y.o.   MRN: [id number]    DATE:  07/02/2015   MRN:  [id number]  BIRTHDAY: Mar 23, 1929  Facility:  Nursing Home Location:  [facility]  Nursing Home Room Number: 104-P  LEVEL OF CARE:  SNF (31)  Contact Information    Name Relation Home Work [place] Son [phone]  [phone]   Cass, Edinger Daughter [phone]  [phone]       Code Status History    Date Active Date Inactive Code Status Order ID Comments User Context   06/02/2015  3:58 AM 06/06/2015  8:12 PM DNR [id number]  Alberteen Sam, MD ED   04/25/2015  9:56 PM 04/30/2015  9:46 PM DNR [id number]  Hillary Bow, DO Inpatient   04/25/2015  9:50 PM 04/25/2015  9:56 PM Full Code [id number]  Hillary Bow, DO Inpatient   01/16/2015 12:46 AM 01/18/2015  4:33 PM DNR [id number]  Lorretta Harp, MD ED   10/20/2014  1:44 PM 10/23/2014  7:22 PM DNR [id number]  Leatha Gilding, MD ED   08/29/2012 11:50 AM 09/02/2012  4:24 PM Full Code [id number]  Alleen Borne, MD Inpatient   08/25/2012  1:43 PM 08/29/2012 11:50 AM Full Code [id number]  Rowe Clack, PA-C Inpatient    Questions for Most Recent Historical Code Status (Order [id number])    Question Answer Comment   In the event of cardiac or respiratory ARREST Do not call a "code blue"    In the event of cardiac or respiratory ARREST Do not perform Intubation, CPR, defibrillation or ACLS    In the event of cardiac or respiratory ARREST Use medication by any route, position, wound care, and other measures to relive pain and suffering. May use oxygen, suction and manual treatment of airway obstruction as needed for comfort.     Advance Directive Documentation        Most Recent Value   Type of Advance Directive  Out of facility DNR (pink MOST or yellow form)   Pre-existing out of facility DNR order (yellow form or pink MOST form)     "MOST" Form in Place?         Chief Complaint  Patient  presents with  . Medical Management of Chronic Issues    HISTORY OF PRESENT ILLNESS:  This is an 82 year old female who is being seen for a routine visit. Her foley catheter has been discontinued and she has been voiding without any difficulty. Latest hgb 9.2, stable  and continues to be on Iron supplement. Her BP has been stable.   has been admitted to [facility] on 06/07/15 from [facility]. She has PMH of dementia, depression, CAD native artery S/P CABG, paroxysmal A. fib on amiodarone no warfarin,  aortic stenosis and hyperlipidemia. She fell at home and sustained a left hip fracture for which she had ORIF on 06/02/15.  Sh has been admitted for a short-term rehabilitation.  PAST MEDICAL HISTORY:  Past Medical History  Diagnosis Date  . Diverticulitis   . Hypertensive heart disease   . Osteoporosis   . CAD (coronary artery disease)     a. 08/2012 Abnl MV;  b. 08/2012 CABGx5: LIMA->LAD, VG->Diag, VG->OM1->OM2, VG->RCA.  Marland Kitchen Dementia   . PAF (paroxysmal atrial fibrillation) (HCC)     a. Rhythm mgmt w/ amio;  b. CHA2DS2VASc = 5-->No anticoagulation 2/2 dementia/falls.  Marland Kitchen HLD (hyperlipidemia)   . GERD (gastroesophageal reflux disease)   .  Dyslipidemia   . Constipation   . Anorexia   . UTI (lower urinary tract infection)   . Generalized weakness   . Compression fracture of T12 vertebra (HCC)   . Hyponatremia   . Pressure ulcer   . Depression   . Severe muscle deconditioning   . Right knee pain   . Allergic rhinitis   . Protein calorie malnutrition (HCC)   . Cerumen impaction   . Insomnia   . Hearing loss of aging   . Mild aortic stenosis     a. 08/2012 Echo: EF 60-65%, no rwma, mild AS, mildly dil LA, PASP .  Marland Kitchen Unsteady gait   . Physical deconditioning   . Closed intertrochanteric fracture of left femur with routine healing   . Acute blood loss anemia   . Urinary retention   . 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   ALENDRONATE (FOSAMAX) 70 MG TABLET    Take 70 mg by mouth once a week. Take with a full glass of water on an empty stomach on Tuesdays   AMIODARONE (PACERONE) 200 MG TABLET    TAKE 1 TABLET BY MOUTH DAILY.   ASPIRIN EC 325 MG TABLET    Take 325 mg by mouth every evening.    ATORVASTATIN (LIPITOR) 40 MG TABLET    TAKE 1 TABLET BY MOUTH DAILY AT 6 PM   CALCIUM CARBONATE-VITAMIN D3 (RA CALCIUM 600/VITAMIN D-3) 600-400 MG-UNIT TABS    Take 1 tablet by mouth 2 (two) times daily.   DIAZEPAM (VALIUM) 2 MG TABLET    Take 0.5 tablets (1 mg total) by mouth every 12 (twelve) hours as needed for anxiety.   FERROUS SULFATE 325 (65 FE) MG TABLET    Take 1 tablet (325 mg total) by mouth 2 (two) times daily with a meal.   FOLIC ACID (FOLVITE) 1 MG TABLET    TAKE 1 TABLET BY MOUTH EVERY DAY   HYDROCODONE-ACETAMINOPHEN (NORCO/VICODIN) 5-325 MG TABLET    Take 1-2 tablets by mouth every 6 (six) hours as needed for moderate pain.   LATANOPROST (XALATAN) 0.005 % OPHTHALMIC SOLUTION    1 drop at bedtime.   MEMANTINE (NAMENDA) 10 MG TABLET    TAKE 1 TABLET (10 MG TOTAL) BY MOUTH 2 (TWO) TIMES DAILY.   METOPROLOL TARTRATE (LOPRESSOR) 25 MG TABLET    TAKE 1 TABLET (25 MG TOTAL) BY MOUTH 2 (TWO) TIMES DAILY.   MULTIPLE VITAMINS-MINERALS (CERTAVITE SENIOR/ANTIOXIDANT) TABS    Take 0.5 tablets by mouth 2 (two) times daily.   POLYETHYLENE GLYCOL (MIRALAX / GLYCOLAX) PACKET    Take 17 g by mouth daily as needed for mild constipation.   PROTEIN (PROCEL) POWD    Take 1 scoop by mouth 2 (two) times daily.   SIMETHICONE 125 MG CAPS    Take 1 capsule by mouth every 8 (eight) hours as needed.   TAMSULOSIN (FLOMAX) 0.4 MG CAPS CAPSULE    Take 1 capsule (0.4 mg total)  by mouth daily.  Modified Medications   No medications on file  Discontinued Medications   ALENDRONATE (FOSAMAX) 70 MG TABLET    TAKE 1 TABLET BY MOUTH EVERY 7 DAYS. TAKE WITH A  FULL GLASS OF WATER ON AN EMPTY STOMACH   LUMIGAN 0.01 % SOLN    Place 1 drop into both eyes at bedtime.   METOPROLOL TARTRATE (LOPRESSOR) 25 MG TABLET    Take 1 tablet by mouth 2 (two) times daily.     No Known Allergies   REVIEW OF SYSTEMS:  GENERAL: no change in appetite, no fatigue, no weight changes, no fever, chills or weakness EYES: Denies change in vision, dry eyes, eye pain, itching or discharge EARS: Denies change in hearing, ringing in ears, or earache NOSE: Denies nasal congestion or epistaxis MOUTH and THROAT: Denies oral discomfort, gingival pain or bleeding, pain from teeth or hoarseness   RESPIRATORY: no cough, SOB, DOE, wheezing, hemoptysis CARDIAC: no chest pain, edema or palpitations GI: no abdominal pain, diarrhea, constipation, heart burn, nausea or vomiting GU: Denies dysuria, frequency, hematuria, incontinence, or discharge PSYCHIATRIC: Denies feeling of depression or anxiety. No report of hallucinations, insomnia, paranoia, or agitation   PHYSICAL EXAMINATION  GENERAL APPEARANCE: Well nourished. In no acute distress. Normal body habitus SKIN:  Left femur surgical site X 2, healed HEAD: Normal in size and contour. No evidence of trauma EYES: Lids open and close normally. No blepharitis, entropion or ectropion. PERRL. Conjunctivae are clear and sclerae are white. Lenses are without opacity EARS: Pinnae are normal. Patient hears normal voice tunes of the examiner MOUTH and THROAT: Lips are without lesions. Oral mucosa is moist and without lesions. Tongue is normal in shape, size, and color and without lesions NECK: supple, trachea midline, no neck masses, no thyroid tenderness, no thyromegaly LYMPHATICS: no LAN in the neck, no supraclavicular LAN RESPIRATORY: breathing is even &  unlabored, BS CTAB CARDIAC: RRR, no murmur,no extra heart sounds, no edema GI: abdomen soft, normal BS, no masses, no tenderness, no hepatomegaly, no splenomegaly EXTREMITIES:  Able to move X 4 extremities PSYCHIATRIC: Alert and oriented X 3. Affect and behavior are appropriate  LABS/RADIOLOGY: Labs reviewed: Basic Metabolic Panel:  Recent Labs  40/98/1109/02/26 1138  06/01/15 2226 06/03/15 0344 06/04/15 06/04/15 0655 06/10/15  NA 136  < > 141 142 142 142 140  K 4.0  < > 4.9 4.5  --  4.0 3.6  CL 99  < > 103 105  --  112*  --   CO2 31  < > 27 25  --  25  --   GLUCOSE 75  < > 175* 127*  --  103*  --   BUN 15  < > 26* 29* 29* 29* 11  CREATININE 0.66  < > 0.86 0.89 0.6 0.64 0.5  CALCIUM 9.5  < > 10.0 8.5*  --  8.1*  --   MG 1.9  --   --   --   --   --   --   < > = values in this interval not displayed. Liver Function Tests:  Recent Labs  04/25/15 1430 05/02/15 05/31/15 1055 06/01/15 2226  AST 112* 32 30 52*  ALT 143* 38* 25 38  ALKPHOS 73 107 88 98  BILITOT 0.6  --  0.4 0.6  PROT 6.3*  --  6.7 6.5  ALBUMIN 2.7*  --  3.5 3.2*    Recent Labs  01/12/15 0750 01/16/15 0245 04/25/15 1430  LIPASE 38 35 53*   CBC:  Recent Labs  06/01/15 2226  06/03/15 1429 06/04/15 0655 06/05/15 0703 06/06/15 06/06/15 0520 06/10/15  WBC 8.7  < > 11.1* 8.8 8.0 7.4 7.4 7.8  NEUTROABS 7.0  --  9.5*  --   --   --   --  6  HGB 12.1  < > 8.3* 7.8* 7.7*  --  8.9* 9.2*  HCT 38.2  < > 25.3* 24.7* 24.3*  --  27.8* 29*  MCV 105.5*  < > 100.4* 100.8* 102.5*  --  97.2  --   PLT 269  < > 164 181 168  --  194 331  < > = values in this interval not displayed.  Lipid Panel:  Recent Labs  01/22/15 1138 05/31/15 1055  HDL 49.30 42.70   Cardiac Enzymes:  Recent Labs  01/16/15 0947 01/16/15 1540 04/25/15 1430  TROPONINI <0.03 <0.03 <0.03   CBG:  Recent Labs  04/25/15 1451  GLUCAP 140*      No results found.  ASSESSMENT/PLAN:  Closed intertrochanteric fracture of left femur S/P  ORIF - continue rehabilitation; continue Norco 5/325 mg 1-2 tabs by mouth every 6 hours when necessary and acetaminophen 325 mg take 2 tabs = 650 mg by mouth every 6 hours when necessary for pain; aspirin EC 325 mg 1 tab by mouth daily evening for DVT prophylaxis; LLE WBAT; follow-up with Dr. Magnus Ivan, orthopedic surgeon  Hypertension - continue metoprolol titrate 25 mg 1 tab by mouth twice a day  Anemia, acute blood loss - hemoglobin 8.9; continue ferrous sulfate 325 mg 1 tab by mouth twice a day; re-check 9.2, stable; check CBC  Atrial fibrillation - rate controlled; continue Lopressor 25 mg 1 tab by mouth twice a day, amiodarone 200 mg 1 tab by mouth daily and aspirin EC 325 mg 1 tab by mouth daily evening   Protein calorie malnutrition - continue Procel 1 scoop by mouth twice a day  Dementia - stable; continue Namenda 10 mg 1 tab by mouth twice a day  Osteoporosis - continue Fosamax 70 mg 1 tab by mouth weekly  Anxiety - mood is stable; continue Valium to 2 mg 1/2 tab= 1 mg BID PRN  Hyperlipidemia - continue Lipitor 40 mg 1 tab by mouth every 6 p.m.     Goals of care:  Short-term rehabilitation    [facility], NP [facility] [phone]

## 2015-07-08 ENCOUNTER — Non-Acute Institutional Stay: Payer: Commercial Managed Care - HMO | Admitting: Adult Health

## 2015-07-08 ENCOUNTER — Encounter: Payer: Self-pay | Admitting: Adult Health

## 2015-07-08 DIAGNOSIS — M810 Age-related osteoporosis without current pathological fracture: Secondary | ICD-10-CM | POA: Diagnosis not present

## 2015-07-08 DIAGNOSIS — F419 Anxiety disorder, unspecified: Secondary | ICD-10-CM | POA: Diagnosis not present

## 2015-07-08 DIAGNOSIS — I480 Paroxysmal atrial fibrillation: Secondary | ICD-10-CM

## 2015-07-08 DIAGNOSIS — F0390 Unspecified dementia without behavioral disturbance: Secondary | ICD-10-CM

## 2015-07-08 DIAGNOSIS — E46 Unspecified protein-calorie malnutrition: Secondary | ICD-10-CM

## 2015-07-08 DIAGNOSIS — D62 Acute posthemorrhagic anemia: Secondary | ICD-10-CM | POA: Diagnosis not present

## 2015-07-08 DIAGNOSIS — E785 Hyperlipidemia, unspecified: Secondary | ICD-10-CM

## 2015-07-08 DIAGNOSIS — I10 Essential (primary) hypertension: Secondary | ICD-10-CM | POA: Diagnosis not present

## 2015-07-08 DIAGNOSIS — S72142D Displaced intertrochanteric fracture of left femur, subsequent encounter for closed fracture with routine healing: Secondary | ICD-10-CM | POA: Diagnosis not present

## 2015-07-08 LAB — CBC AND DIFFERENTIAL
HEMATOCRIT: 38 % (ref 36–46)
HEMOGLOBIN: 11.8 g/dL — AB (ref 12.0–16.0)
NEUTROS ABS: 4 /uL
PLATELETS: 295 10*3/uL (ref 150–399)
WBC: 5.4 10^3/mL

## 2015-07-08 NOTE — Progress Notes (Signed)
Patient ID: Jacqueline [name], female   DOB: 07-23-1928, 82 y.o.   MRN: [id number]    DATE:  07/08/15   MRN:  [id number]  BIRTHDAY: [birth year]  Facility:  Nursing Home Location:  [facility]  Nursing Home Room Number: 104-P  LEVEL OF CARE:  SNF (31)  Contact Information    Name Relation Home Work [place] Son [phone]  [phone]   Zykiria, [place] Daughter [phone]  [phone]       Code Status History    Date Active Date Inactive Code Status Order ID Comments User Context   06/02/2015  3:58 AM 06/06/2015  8:12 PM DNR [id number]  Alberteen Sam, MD ED   04/25/2015  9:56 PM 04/30/2015  9:46 PM DNR [id number]  Hillary Bow, DO Inpatient   04/25/2015  9:50 PM 04/25/2015  9:56 PM Full Code [id number]  Hillary Bow, DO Inpatient   01/16/2015 12:46 AM 01/18/2015  4:33 PM DNR [id number]  Lorretta Harp, MD ED   10/20/2014  1:44 PM 10/23/2014  7:22 PM DNR [id number]  Leatha Gilding, MD ED   08/29/2012 11:50 AM 09/02/2012  4:24 PM Full Code [id number]  Alleen Borne, MD Inpatient   08/25/2012  1:43 PM 08/29/2012 11:50 AM Full Code [id number]  Rowe Clack, PA-C Inpatient    Questions for Most Recent Historical Code Status (Order [id number])    Question Answer Comment   In the event of cardiac or respiratory ARREST Do not call a "code blue"    In the event of cardiac or respiratory ARREST Do not perform Intubation, CPR, defibrillation or ACLS    In the event of cardiac or respiratory ARREST Use medication by any route, position, wound care, and other measures to relive pain and suffering. May use oxygen, suction and manual treatment of airway obstruction as needed for comfort.     Advance Directive Documentation        Most Recent Value   Type of Advance Directive  Out of facility DNR (pink MOST or yellow form)   Pre-existing out of facility DNR order (yellow form or pink MOST form)     "MOST" Form in Place?         Chief Complaint  Patient  presents with  . Discharge Note    HISTORY OF PRESENT ILLNESS:  This is an 82 year old female who is for discharge home with Home health PT, OT, CNA and SW. She has been admitted to [facility] on 06/07/15 from [facility]. She has PMH of dementia, depression, CAD native artery S/P CABG, paroxysmal A. fib on amiodarone no warfarin,  aortic stenosis and hyperlipidemia. She fell at home and sustained a left hip fracture for which she had ORIF on 06/02/15.  Patient was admitted to this facility for short-term rehabilitation after the patient's recent hospitalization.  Patient has completed SNF rehabilitation and therapy has cleared the patient for discharge.  PAST MEDICAL HISTORY:  Past Medical History  Diagnosis Date  . Diverticulitis   . Hypertensive heart disease   . Osteoporosis   . CAD (coronary artery disease)     a. 08/2012 Abnl MV;  b. 08/2012 CABGx5: LIMA->LAD, VG->Diag, VG->OM1->OM2, VG->RCA.  Marland Kitchen Dementia   . PAF (paroxysmal atrial fibrillation) (HCC)     a. Rhythm mgmt w/ amio;  b. CHA2DS2VASc = 5-->No anticoagulation 2/2 dementia/falls.  Marland Kitchen HLD (hyperlipidemia)   . GERD (gastroesophageal reflux disease)   . Dyslipidemia   . Constipation   .  Anorexia   . UTI (lower urinary tract infection)   . Generalized weakness   . Compression fracture of T12 vertebra (HCC)   . Hyponatremia   . Pressure ulcer   . Depression   . Severe muscle deconditioning   . Right knee pain   . Allergic rhinitis   . Protein calorie malnutrition (HCC)   . Cerumen impaction   . Insomnia   . Hearing loss of aging   . Mild aortic stenosis     a. 08/2012 Echo: EF 60-65%, no rwma, mild AS, mildly dil LA, PASP .  Marland Kitchen Unsteady gait   . Physical deconditioning   . Closed intertrochanteric fracture of left femur with routine healing   . Acute blood loss anemia   . Urinary retention   . 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 HLD (hyperlipidemia)   .  Anxiety      CURRENT MEDICATIONS: Reviewed  Patient's Medications  New Prescriptions   No medications on file  Previous Medications   ACETAMINOPHEN (TYLENOL) 325 MG TABLET    Take 2 tablets (650 mg total) by mouth every 6 (six) hours as needed for mild pain or headache.   ALENDRONATE (FOSAMAX) 70 MG TABLET    Take 70 mg by mouth once a week. Take with a full glass of water on an empty stomach on Tuesdays   AMIODARONE (PACERONE) 200 MG TABLET    TAKE 1 TABLET BY MOUTH DAILY.   ASPIRIN EC 325 MG TABLET    Take 325 mg by mouth every evening.    ATORVASTATIN (LIPITOR) 40 MG TABLET    TAKE 1 TABLET BY MOUTH DAILY AT 6 PM   CALCIUM CARBONATE-VITAMIN D3 (RA CALCIUM 600/VITAMIN D-3) 600-400 MG-UNIT TABS    Take 1 tablet by mouth 2 (two) times daily.   DIAZEPAM (VALIUM) 2 MG TABLET    Take 0.5 tablets (1 mg total) by mouth every 12 (twelve) hours as needed for anxiety.   FERROUS SULFATE 325 (65 FE) MG TABLET    Take 1 tablet (325 mg total) by mouth 2 (two) times daily with a meal.   FOLIC ACID (FOLVITE) 1 MG TABLET    TAKE 1 TABLET BY MOUTH EVERY DAY   HYDROCODONE-ACETAMINOPHEN (NORCO/VICODIN) 5-325 MG TABLET    Take 1-2 tablets by mouth every 6 (six) hours as needed for moderate pain.   LATANOPROST (XALATAN) 0.005 % OPHTHALMIC SOLUTION    Place 1 drop into both eyes at bedtime.    MEMANTINE (NAMENDA) 10 MG TABLET    TAKE 1 TABLET (10 MG TOTAL) BY MOUTH 2 (TWO) TIMES DAILY.   METOPROLOL TARTRATE (LOPRESSOR) 25 MG TABLET    TAKE 1 TABLET (25 MG TOTAL) BY MOUTH 2 (TWO) TIMES DAILY.   MULTIPLE VITAMINS-MINERALS (CERTAVITE SENIOR/ANTIOXIDANT) TABS    Take 0.5 tablets by mouth 2 (two) times daily.   POLYETHYLENE GLYCOL (MIRALAX / GLYCOLAX) PACKET    Take 17 g by mouth daily as needed for mild constipation.   PROTEIN (PROCEL) POWD    Take 1 scoop by mouth 2 (two) times daily.   SIMETHICONE 125 MG CAPS    Take 1 capsule by mouth every 8 (eight) hours as needed.   TAMSULOSIN (FLOMAX) 0.4 MG CAPS CAPSULE     Take 1 capsule (0.4 mg total) by mouth daily.  Modified Medications   No medications on file  Discontinued Medications   No medications on file     No Known Allergies  REVIEW OF SYSTEMS:  GENERAL: no change in appetite, no fatigue, no weight changes, no fever, chills or weakness EYES: Denies change in vision, dry eyes, eye pain, itching or discharge EARS: Denies change in hearing, ringing in ears, or earache NOSE: Denies nasal congestion or epistaxis MOUTH and THROAT: Denies oral discomfort, gingival pain or bleeding, pain from teeth or hoarseness   RESPIRATORY: no cough, SOB, DOE, wheezing, hemoptysis CARDIAC: no chest pain, edema or palpitations GI: no abdominal pain, diarrhea, constipation, heart burn, nausea or vomiting GU: Denies dysuria, frequency, hematuria, incontinence, or discharge PSYCHIATRIC: Denies feeling of depression or anxiety. No report of hallucinations, insomnia, paranoia, or agitation   PHYSICAL EXAMINATION  GENERAL APPEARANCE: Well nourished. In no acute distress. Normal body habitus SKIN:  Left femur surgical site X 2, healed HEAD: Normal in size and contour. No evidence of trauma EYES: Lids open and close normally. No blepharitis, entropion or ectropion. PERRL. Conjunctivae are clear and sclerae are white. Lenses are without opacity EARS: Pinnae are normal. Patient hears normal voice tunes of the examiner MOUTH and THROAT: Lips are without lesions. Oral mucosa is moist and without lesions. Tongue is normal in shape, size, and color and without lesions NECK: supple, trachea midline, no neck masses, no thyroid tenderness, no thyromegaly LYMPHATICS: no LAN in the neck, no supraclavicular LAN RESPIRATORY: breathing is even & unlabored, BS CTAB CARDIAC: RRR, no murmur,no extra heart sounds, no edema GI: abdomen soft, normal BS, no masses, no tenderness, no hepatomegaly, no splenomegaly EXTREMITIES:  Able to move X 4 extremities PSYCHIATRIC: Alert and  oriented X 3. Affect and behavior are appropriate  LABS/RADIOLOGY: Labs reviewed: 07/08/15  WBC 5.4 hemoglobin 11.8   hct  38.3  MCV 106.4 platelet 295 sodium 140 potassium 3.6 glucose 89 BUN 11  creatinine 0.49 calcium 7.8 Basic Metabolic Panel:  Recent Labs  16/10/96 1138  06/01/15 2226 06/03/15 0344 06/04/15 06/04/15 0655 06/10/15  NA 136  < > 141 142 142 142 140  K 4.0  < > 4.9 4.5  --  4.0 3.6  CL 99  < > 103 105  --  112*  --   CO2 31  < > 27 25  --  25  --   GLUCOSE 75  < > 175* 127*  --  103*  --   BUN 15  < > 26* 29* 29* 29* 11  CREATININE 0.66  < > 0.86 0.89 0.6 0.64 0.5  CALCIUM 9.5  < > 10.0 8.5*  --  8.1*  --   MG 1.9  --   --   --   --   --   --   < > = values in this interval not displayed. Liver Function Tests:  Recent Labs  04/25/15 1430 05/02/15 05/31/15 1055 06/01/15 2226  AST 112* 32 30 52*  ALT 143* 38* 25 38  ALKPHOS 73 107 88 98  BILITOT 0.6  --  0.4 0.6  PROT 6.3*  --  6.7 6.5  ALBUMIN 2.7*  --  3.5 3.2*    Recent Labs  01/12/15 0750 01/16/15 0245 04/25/15 1430  LIPASE 38 35 53*   CBC:  Recent Labs  06/03/15 1429 06/04/15 0655 06/05/15 0703  06/06/15 0520 06/10/15 07/08/15  WBC 11.1* 8.8 8.0  < > 7.4 7.8 5.4  NEUTROABS 9.5*  --   --   --   --  6 4  HGB 8.3* 7.8* 7.7*  --  8.9* 9.2* 11.8*  HCT 25.3* 24.7*  24.3*  --  27.8* 29* 38  MCV 100.4* 100.8* 102.5*  --  97.2  --   --   PLT 164 181 168  --  194 331 295  < > = values in this interval not displayed.  Lipid Panel:  Recent Labs  01/22/15 1138 05/31/15 1055  HDL 49.30 42.70   Cardiac Enzymes:  Recent Labs  01/16/15 0947 01/16/15 1540 04/25/15 1430  TROPONINI <0.03 <0.03 <0.03   CBG:  Recent Labs  04/25/15 1451  GLUCAP 140*      ASSESSMENT/PLAN:  Closed intertrochanteric fracture of left femur S/P ORIF - for home health PT, OT, CNA and SW; continue Norco 5/325 mg 1-2 tabs by mouth every 6 hours when necessary and acetaminophen 325 mg take 2 tabs = 650 mg  by mouth every 6 hours when necessary for pain; aspirin EC 325 mg 1 tab by mouth daily evening for DVT prophylaxis; LLE WBAT; follow-up with Dr. Magnus IvanBlackman, orthopedic surgeon  Hypertension - continue metoprolol titrate 25 mg 1 tab by mouth twice a day  Anemia, acute blood loss - continue ferrous sulfate 325 mg 1 tab by mouth twice a day; re-check CBC  Atrial fibrillation - rate controlled; continue Lopressor 25 mg 1 tab by mouth twice a day, amiodarone 200 mg 1 tab by mouth daily and aspirin EC 325 mg 1 tab by mouth daily evening   Protein calorie malnutrition - continue Procel 1 scoop by mouth twice a day  Dementia - stable; continue Namenda 10 mg 1 tab by mouth twice a day  Osteoporosis - continue Fosamax 70 mg 1 tab by mouth weekly  Anxiety - mood is stable; continue Valium to 2 mg 1/2 tab= 1 mg BID PRN  Hyperlipidemia - continue Lipitor 40 mg 1 tab by mouth every 6 p.m.      I have filled out patient's discharge paperwork and written prescriptions.  Patient will receive home health PT, OT, SW and CNA.  Total discharge time: Greater than 30 minutes  Discharge time involved coordination of the discharge process with social worker, nursing staff and therapy department. Medical justification for home health services verified.     [facility],Theodus Ran, NP [facility] [phone]

## 2015-07-16 ENCOUNTER — Telehealth: Payer: Self-pay | Admitting: *Deleted

## 2015-07-16 NOTE — Telephone Encounter (Signed)
Received physician Order; forwarded to provider/SLS 04/04

## 2015-07-17 ENCOUNTER — Telehealth: Payer: Self-pay | Admitting: *Deleted

## 2015-07-17 ENCOUNTER — Telehealth: Payer: Self-pay | Admitting: Family Medicine

## 2015-07-17 NOTE — Telephone Encounter (Signed)
Received Physician Order; forwarded to provider/SLS 04/05

## 2015-07-17 NOTE — Telephone Encounter (Signed)
Melissa-- please advise in Dr Lowne's absence? 

## 2015-07-17 NOTE — Telephone Encounter (Signed)
Noted. HR was 53 at her most recent office visit.  Will defer to PCP at tomorrow's apt.

## 2015-07-17 NOTE — Telephone Encounter (Signed)
Caller name:[facility]  Can be reached: [phone]   Reason for call: [facility] called because patient's HR is running low. States that last reading was 49 BPM. Wanted to report HR. Patient has appointment tomorrow. Plse call if there is anything that needs to be done.

## 2015-07-18 ENCOUNTER — Encounter: Payer: Self-pay | Admitting: Family Medicine

## 2015-07-18 ENCOUNTER — Ambulatory Visit (HOSPITAL_BASED_OUTPATIENT_CLINIC_OR_DEPARTMENT_OTHER)
Admission: RE | Admit: 2015-07-18 | Discharge: 2015-07-18 | Disposition: A | Discharge status: Home / Self Care | Payer: Commercial Managed Care - HMO | Source: Ambulatory Visit | Attending: Family Medicine | Admitting: Family Medicine

## 2015-07-18 ENCOUNTER — Other Ambulatory Visit: Payer: Self-pay | Admitting: Family Medicine

## 2015-07-18 ENCOUNTER — Ambulatory Visit (INDEPENDENT_AMBULATORY_CARE_PROVIDER_SITE_OTHER): Payer: Commercial Managed Care - HMO | Admitting: Family Medicine

## 2015-07-18 VITALS — BP 122/74 | HR 51 | Temp 97.8°F | Ht 65.0 in | Wt 110.0 lb

## 2015-07-18 DIAGNOSIS — I10 Essential (primary) hypertension: Secondary | ICD-10-CM

## 2015-07-18 DIAGNOSIS — M545 Low back pain, unspecified: Secondary | ICD-10-CM

## 2015-07-18 DIAGNOSIS — D62 Acute posthemorrhagic anemia: Secondary | ICD-10-CM | POA: Diagnosis not present

## 2015-07-18 DIAGNOSIS — M8580 Other specified disorders of bone density and structure, unspecified site: Secondary | ICD-10-CM | POA: Diagnosis not present

## 2015-07-18 DIAGNOSIS — N390 Urinary tract infection, site not specified: Secondary | ICD-10-CM

## 2015-07-18 DIAGNOSIS — R8299 Other abnormal findings in urine: Secondary | ICD-10-CM

## 2015-07-18 DIAGNOSIS — M4854XA Collapsed vertebra, not elsewhere classified, thoracic region, initial encounter for fracture: Secondary | ICD-10-CM | POA: Diagnosis not present

## 2015-07-18 DIAGNOSIS — R82998 Other abnormal findings in urine: Secondary | ICD-10-CM

## 2015-07-18 LAB — CBC WITH DIFFERENTIAL/PLATELET
BASOS ABS: 0 10*3/uL (ref 0.0–0.1)
Basophils Relative: 0 % (ref 0.0–3.0)
EOS ABS: 0 10*3/uL (ref 0.0–0.7)
Eosinophils Relative: 0.5 % (ref 0.0–5.0)
HCT: 37.7 % (ref 36.0–46.0)
Hemoglobin: 12.4 g/dL (ref 12.0–15.0)
LYMPHS ABS: 0.5 10*3/uL — AB (ref 0.7–4.0)
Lymphocytes Relative: 6.3 % — ABNORMAL LOW (ref 12.0–46.0)
MCHC: 33 g/dL (ref 30.0–36.0)
MCV: 100.8 fl — ABNORMAL HIGH (ref 78.0–100.0)
Monocytes Absolute: 0.5 10*3/uL (ref 0.1–1.0)
Monocytes Relative: 5.6 % (ref 3.0–12.0)
NEUTROS ABS: 7.3 10*3/uL (ref 1.4–7.7)
PLATELETS: 264 10*3/uL (ref 150.0–400.0)
RBC: 3.74 Mil/uL — ABNORMAL LOW (ref 3.87–5.11)
RDW: 20.1 % — ABNORMAL HIGH (ref 11.5–15.5)
WBC: 8.3 10*3/uL (ref 4.0–10.5)

## 2015-07-18 LAB — COMPREHENSIVE METABOLIC PANEL
ALK PHOS: 152 U/L — AB (ref 39–117)
ALT: 29 U/L (ref 0–35)
AST: 40 U/L — AB (ref 0–37)
Albumin: 3.3 g/dL — ABNORMAL LOW (ref 3.5–5.2)
BILIRUBIN TOTAL: 0.6 mg/dL (ref 0.2–1.2)
BUN: 16 mg/dL (ref 6–23)
CO2: 28 meq/L (ref 19–32)
CREATININE: 0.7 mg/dL (ref 0.40–1.20)
Calcium: 9.2 mg/dL (ref 8.4–10.5)
Chloride: 103 mEq/L (ref 96–112)
GFR: 84.59 mL/min (ref >60.00)
GLUCOSE: 123 mg/dL — AB (ref 70–99)
Potassium: 5.8 mEq/L — ABNORMAL HIGH (ref 3.5–5.1)
SODIUM: 136 meq/L (ref 135–145)
TOTAL PROTEIN: 6.7 g/dL (ref 6.0–8.3)

## 2015-07-18 LAB — POCT URINALYSIS DIPSTICK
BILIRUBIN UA: NEGATIVE
GLUCOSE UA: NEGATIVE
KETONES UA: NEGATIVE
Protein, UA: NEGATIVE
SPEC GRAV UA: 1.02
Urobilinogen, UA: 2
pH, UA: 6

## 2015-07-18 MED ORDER — HYDROCODONE-ACETAMINOPHEN 5-325 MG PO TABS
1.0000 | ORAL_TABLET | Freq: Four times a day (QID) | ORAL | Status: DC | PRN
Start: 2015-07-18 — End: 2015-09-26

## 2015-07-18 MED ORDER — METOPROLOL TARTRATE 25 MG PO TABS: ORAL_TABLET | ORAL | Status: DC

## 2015-07-18 MED ORDER — SULFAMETHOXAZOLE-TRIMETHOPRIM 800-160 MG PO TABS: 1.0000 | ORAL_TABLET | Freq: Two times a day (BID) | ORAL | Status: DC

## 2015-07-18 NOTE — Progress Notes (Signed)
Pre visit review using our clinic review tool, if applicable. No additional management support is needed unless otherwise documented below in the visit note. 

## 2015-07-18 NOTE — Patient Instructions (Signed)

## 2015-07-20 LAB — URINE CULTURE: Colony Count: >=100000

## 2015-07-21 NOTE — Progress Notes (Signed)
Patient ID: Jacqueline [name], female    DOB: May 03, 1928  Age: 82 y.o. MRN: [id number]    Subjective:  Subjective HPI KAHLIYA FRALEIGH presents with son with c/o low back pain . Pt has not gotten out of the wheelchair for last 3-4 days and she states the back pain is severe.  She just got out of rehab and she was walking on her own.   No new injury that family is aware of.     Review of Systems  Constitutional: Negative for fever, chills, diaphoresis, activity change, appetite change, fatigue and unexpected weight change.  Eyes: Negative for pain, redness and visual disturbance.  Respiratory: Negative for cough, chest tightness, shortness of breath and wheezing.   Cardiovascular: Negative for chest pain, palpitations and leg swelling.  Gastrointestinal: Negative for abdominal pain and abdominal distention.  Endocrine: Negative for cold intolerance, heat intolerance, polydipsia, polyphagia and polyuria.  Genitourinary: Positive for urgency. Negative for dysuria, frequency, hematuria, flank pain, vaginal discharge, difficulty urinating, genital sores, vaginal pain, menstrual problem, pelvic pain and dyspareunia.  Musculoskeletal: Negative for back pain.  Neurological: Negative for dizziness, light-headedness, numbness and headaches.    History Past Medical History  Diagnosis Date  . Diverticulitis   . Hypertensive heart disease   . Osteoporosis   . CAD (coronary artery disease)     a. 08/2012 Abnl MV;  b. 08/2012 CABGx5: LIMA->LAD, VG->Diag, VG->OM1->OM2, VG->RCA.  Marland Kitchen Dementia   . PAF (paroxysmal atrial fibrillation) (HCC)     a. Rhythm mgmt w/ amio;  b. CHA2DS2VASc = 5-->No anticoagulation 2/2 dementia/falls.  Marland Kitchen HLD (hyperlipidemia)   . GERD (gastroesophageal reflux disease)   . Dyslipidemia   . Constipation   . Anorexia   . UTI (lower urinary tract infection)   . Generalized weakness   . Compression fracture of T12 vertebra (HCC)   . Hyponatremia   . Pressure ulcer   .  Depression   . Severe muscle deconditioning   . Right knee pain   . Allergic rhinitis   . Protein calorie malnutrition (HCC)   . Cerumen impaction   . Insomnia   . Hearing loss of aging   . Mild aortic stenosis     a. 08/2012 Echo: EF 60-65%, no rwma, mild AS, mildly dil LA, PASP .  Marland Kitchen Unsteady gait   . Physical deconditioning   . Closed intertrochanteric fracture of left femur with routine healing   . Acute blood loss anemia   . Urinary retention   . 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 HLD (hyperlipidemia)   . Anxiety     She has past surgical history that includes Fracture surgery; Partial colectomy; Coronary artery bypass graft (N/A, 08/25/2012); left heart catheterization with coronary angiogram (N/A, 08/22/2012); and Intramedullary (im) nail intertrochanteric (Left, 06/02/2015).   Her family history includes Cancer in her mother; Hypertension in her other. There is no history of Diabetes, Heart disease, or Stroke.She reports that she has quit smoking. Her smoking use included Cigarettes. She has never used smokeless tobacco. She reports that she does not drink alcohol or use illicit drugs.  Current Outpatient Prescriptions on File Prior to Visit  Medication Sig Dispense Refill  . acetaminophen (TYLENOL) 325 MG tablet Take 2 tablets (650 mg total) by mouth every 6 (six) hours as needed for mild pain or headache.    . alendronate (FOSAMAX) 70 MG tablet Take 70 mg by mouth once a week. Take with a full glass of water  on an empty stomach on Tuesdays    . amiodarone (PACERONE) 200 MG tablet TAKE 1 TABLET BY MOUTH DAILY. 30 tablet 3  . aspirin EC 325 MG tablet Take 325 mg by mouth every evening.     Marland Kitchen atorvastatin (LIPITOR) 40 MG tablet TAKE 1 TABLET BY MOUTH DAILY AT 6 PM 90 tablet 0  . Calcium Carbonate-Vitamin D3 (RA CALCIUM 600/VITAMIN D-3) 600-400 MG-UNIT TABS Take 1 tablet by mouth 2 (two) times daily.    . diazepam (VALIUM) 2 MG  tablet Take 0.5 tablets (1 mg total) by mouth every 12 (twelve) hours as needed for anxiety. 10 tablet 0  . ferrous sulfate 325 (65 FE) MG tablet Take 1 tablet (325 mg total) by mouth 2 (two) times daily with a meal.    . folic acid (FOLVITE) 1 MG tablet TAKE 1 TABLET BY MOUTH EVERY DAY 90 tablet 1  . latanoprost (XALATAN) 0.005 % ophthalmic solution Place 1 drop into both eyes at bedtime.     . memantine (NAMENDA) 10 MG tablet TAKE 1 TABLET (10 MG TOTAL) BY MOUTH 2 (TWO) TIMES DAILY. 60 tablet 9  . Multiple Vitamins-Minerals (CERTAVITE SENIOR/ANTIOXIDANT) TABS Take 0.5 tablets by mouth 2 (two) times daily.    . polyethylene glycol (MIRALAX / GLYCOLAX) packet Take 17 g by mouth daily as needed for mild constipation.    . Protein (PROCEL) POWD Take 1 scoop by mouth 2 (two) times daily.    . Simethicone 125 MG CAPS Take 1 capsule by mouth every 8 (eight) hours as needed.    . tamsulosin (FLOMAX) 0.4 MG CAPS capsule Take 1 capsule (0.4 mg total) by mouth daily.     No current facility-administered medications on file prior to visit.     Objective:  Objective Physical Exam  Constitutional: She is oriented to person, place, and time. She appears well-developed and well-nourished.  HENT:  Head: Normocephalic and atraumatic.  Eyes: Conjunctivae and EOM are normal.  Neck: Normal range of motion. Neck supple. No JVD present. Carotid bruit is not present. No thyromegaly present.  Cardiovascular: Normal rate, regular rhythm and normal heart sounds.   No murmur heard. Pulmonary/Chest: Effort normal and breath sounds normal. No respiratory distress. She has no wheezes. She has no rales. She exhibits no tenderness.  Abdominal: Soft. There is no tenderness.  Musculoskeletal: She exhibits tenderness. She exhibits no edema.       Lumbar back: She exhibits tenderness.       Back:  Neurological: She is alert and oriented to person, place, and time.  Psychiatric: She has a normal mood and affect.    Nursing note and vitals reviewed.  BP 122/74 mmHg  Pulse 51  Temp(Src) 97.8 F (36.6 C) (Oral)  Ht  (1.651 m)  Wt 110 lb (49.896 kg)  BMI 18.31 kg/m2  SpO2 98% Wt Readings from Last 3 Encounters:  07/18/15 110 lb (49.896 kg)  07/08/15 110 lb (49.896 kg)  07/02/15 113 lb (51.256 kg)     Lab Results  Component Value Date   WBC 8.3 07/18/2015   HGB 12.4 07/18/2015   HCT 37.7 07/18/2015   PLT 264.0 07/18/2015   GLUCOSE 123* 07/18/2015   CHOL 93 05/31/2015   TRIG 82.0 05/31/2015   HDL 42.70 05/31/2015   LDLCALC 34 05/31/2015   ALT 29 07/18/2015   AST 40* 07/18/2015   NA 136 07/18/2015   K 5.8* 07/18/2015   CL 103 07/18/2015   CREATININE 0.70 07/18/2015  BUN 16 07/18/2015   CO2 28 07/18/2015   TSH 2.323 01/16/2015   INR 1.15 06/02/2015   HGBA1C 5.4 08/25/2012    Dg Lumbar Spine Complete  07/18/2015  CLINICAL DATA:  Low back pain, pelvic pain for 3 days EXAM: LUMBAR SPINE - COMPLETE 4+ VIEW COMPARISON:  CT scan 04/25/2015 FINDINGS: Five views of lumbar spine submitted. There is diffuse osteopenia. IVC filter in place again noted. Atherosclerotic calcifications of abdominal aorta. Again noted significant compression deformity T12 vertebral body. There is interval mild compression deformity upper endplate of T11 and T10 vertebral bodies of indeterminate age. Clinical correlation is necessary. Mild lumbar levoscoliosis. Partially visualized right hip prosthesis. IMPRESSION: Diffuse osteopenia. No lumbar spine acute fracture or subluxation. Again noted significant compression fracture of T12 vertebral body. There is interval mild compression deformity upper endplate of T11 and T10 vertebral bodies of indeterminate age. Clinical correlation is necessary. Electronically Signed   By: Natasha MeadLiviu  Pop M.D.   On: 07/18/2015 20:13   Dg Hip Unilat With Pelvis 2-3 Views Left  07/18/2015  CLINICAL DATA:  Pain low back and pelvis for 3-4 days EXAM: DG HIP (WITH OR WITHOUT PELVIS) 2-3V LEFT  COMPARISON:  06/01/2015 FINDINGS: Intra medullary nail left femur. No change total right hip replacement. Stable sacroiliitis and pubic symphysitis. No evidence of pelvic bone fracture. On the left, there is improved positioning and alignment at the site of intertrochanteric left femur fracture as compared to 06/01/2015. Fracture line remains visible. IMPRESSION: No acute findings. Electronically Signed   By: Esperanza Heiraymond  Rubner M.D.   On: 07/18/2015 20:15     Assessment & Plan:  Plan I have changed Ms. Ramcharan's metoprolol tartrate. I am also having her maintain her aspirin EC, memantine, folic acid, acetaminophen, amiodarone, atorvastatin, diazepam, polyethylene glycol, ferrous sulfate, tamsulosin, CERTAVITE SENIOR/ANTIOXIDANT, Simethicone, Calcium Carbonate-Vitamin D3, alendronate, latanoprost, PROCEL, and HYDROcodone-acetaminophen.  Meds ordered this encounter  Medications  . metoprolol tartrate (LOPRESSOR) 25 MG tablet    Sig: TAKE 1/2 TABLET (25 MG TOTAL) BY MOUTH 2 (TWO) TIMES DAILY.    Dispense:  180 tablet    Refill:  1  . HYDROcodone-acetaminophen (NORCO/VICODIN) 5-325 MG tablet    Sig: Take 1-2 tablets by mouth every 6 (six) hours as needed for moderate pain.    Dispense:  30 tablet    Refill:  0    Problem List Items Addressed This Visit    None    Visit Diagnoses    Essential hypertension    -  Primary    Relevant Medications    metoprolol tartrate (LOPRESSOR) 25 MG tablet    Other Relevant Orders    Comprehensive metabolic panel (Completed)    Bilateral low back pain without sciatica        Relevant Medications    HYDROcodone-acetaminophen (NORCO/VICODIN) 5-325 MG tablet    Other Relevant Orders    DG Lumbar Spine Complete (Completed)    DG HIP UNILAT WITH PELVIS 2-3 VIEWS LEFT (Completed)    Comprehensive metabolic panel (Completed)    POCT urinalysis dipstick (Completed)    Acute blood loss anemia        Relevant Orders    CBC with Differential/Platelet (Completed)     Other abnormal findings in urine        Relevant Orders    Urine culture (Completed)       Follow-up: Return in about 3 months (around 10/17/2015), or if symptoms worsen or fail to improve.  Donato SchultzYvonne R Lowne Chase, DO

## 2015-07-22 ENCOUNTER — Telehealth: Payer: Self-pay | Admitting: Family Medicine

## 2015-07-22 ENCOUNTER — Emergency Department (HOSPITAL_COMMUNITY): Payer: Commercial Managed Care - HMO

## 2015-07-22 ENCOUNTER — Encounter (HOSPITAL_COMMUNITY): Payer: Self-pay | Admitting: *Deleted

## 2015-07-22 ENCOUNTER — Emergency Department (HOSPITAL_COMMUNITY)
Admission: EM | Admit: 2015-07-22 | Discharge: 2015-07-23 | Disposition: A | Discharge status: Rehabilitation Facility | Payer: Commercial Managed Care - HMO | Attending: Emergency Medicine | Admitting: Emergency Medicine

## 2015-07-22 DIAGNOSIS — Z951 Presence of aortocoronary bypass graft: Secondary | ICD-10-CM | POA: Diagnosis not present

## 2015-07-22 DIAGNOSIS — I2510 Atherosclerotic heart disease of native coronary artery without angina pectoris: Secondary | ICD-10-CM | POA: Diagnosis not present

## 2015-07-22 DIAGNOSIS — Z8669 Personal history of other diseases of the nervous system and sense organs: Secondary | ICD-10-CM | POA: Diagnosis not present

## 2015-07-22 DIAGNOSIS — Z87440 Personal history of urinary (tract) infections: Secondary | ICD-10-CM | POA: Diagnosis not present

## 2015-07-22 DIAGNOSIS — K219 Gastro-esophageal reflux disease without esophagitis: Secondary | ICD-10-CM | POA: Insufficient documentation

## 2015-07-22 DIAGNOSIS — F419 Anxiety disorder, unspecified: Secondary | ICD-10-CM | POA: Diagnosis not present

## 2015-07-22 DIAGNOSIS — M549 Dorsalgia, unspecified: Secondary | ICD-10-CM

## 2015-07-22 DIAGNOSIS — Z872 Personal history of diseases of the skin and subcutaneous tissue: Secondary | ICD-10-CM | POA: Insufficient documentation

## 2015-07-22 DIAGNOSIS — K5909 Other constipation: Secondary | ICD-10-CM | POA: Diagnosis not present

## 2015-07-22 DIAGNOSIS — I10 Essential (primary) hypertension: Secondary | ICD-10-CM | POA: Diagnosis not present

## 2015-07-22 DIAGNOSIS — Z8709 Personal history of other diseases of the respiratory system: Secondary | ICD-10-CM | POA: Insufficient documentation

## 2015-07-22 DIAGNOSIS — R109 Unspecified abdominal pain: Secondary | ICD-10-CM | POA: Diagnosis present

## 2015-07-22 DIAGNOSIS — S22000D Wedge compression fracture of unspecified thoracic vertebra, subsequent encounter for fracture with routine healing: Secondary | ICD-10-CM

## 2015-07-22 DIAGNOSIS — Z87891 Personal history of nicotine dependence: Secondary | ICD-10-CM | POA: Insufficient documentation

## 2015-07-22 DIAGNOSIS — Z9889 Other specified postprocedural states: Secondary | ICD-10-CM | POA: Insufficient documentation

## 2015-07-22 DIAGNOSIS — Z7982 Long term (current) use of aspirin: Secondary | ICD-10-CM | POA: Diagnosis not present

## 2015-07-22 DIAGNOSIS — I480 Paroxysmal atrial fibrillation: Secondary | ICD-10-CM | POA: Diagnosis not present

## 2015-07-22 DIAGNOSIS — R296 Repeated falls: Secondary | ICD-10-CM

## 2015-07-22 DIAGNOSIS — Z8781 Personal history of (healed) traumatic fracture: Secondary | ICD-10-CM | POA: Insufficient documentation

## 2015-07-22 DIAGNOSIS — M8448XA Pathological fracture, other site, initial encounter for fracture: Secondary | ICD-10-CM | POA: Diagnosis not present

## 2015-07-22 DIAGNOSIS — E785 Hyperlipidemia, unspecified: Secondary | ICD-10-CM | POA: Diagnosis not present

## 2015-07-22 DIAGNOSIS — Z79899 Other long term (current) drug therapy: Secondary | ICD-10-CM | POA: Insufficient documentation

## 2015-07-22 DIAGNOSIS — Z789 Other specified health status: Secondary | ICD-10-CM

## 2015-07-22 DIAGNOSIS — R262 Difficulty in walking, not elsewhere classified: Secondary | ICD-10-CM | POA: Diagnosis not present

## 2015-07-22 DIAGNOSIS — R627 Adult failure to thrive: Secondary | ICD-10-CM

## 2015-07-22 DIAGNOSIS — Z792 Long term (current) use of antibiotics: Secondary | ICD-10-CM | POA: Insufficient documentation

## 2015-07-22 DIAGNOSIS — F0390 Unspecified dementia without behavioral disturbance: Secondary | ICD-10-CM | POA: Insufficient documentation

## 2015-07-22 DIAGNOSIS — S22000A Wedge compression fracture of unspecified thoracic vertebra, initial encounter for closed fracture: Secondary | ICD-10-CM

## 2015-07-22 DIAGNOSIS — D649 Anemia, unspecified: Secondary | ICD-10-CM | POA: Insufficient documentation

## 2015-07-22 LAB — URINALYSIS, ROUTINE W REFLEX MICROSCOPIC
Bilirubin Urine: NEGATIVE
Glucose, UA: NEGATIVE mg/dL
HGB URINE DIPSTICK: NEGATIVE
Ketones, ur: NEGATIVE mg/dL
LEUKOCYTES UA: NEGATIVE
NITRITE: NEGATIVE
PROTEIN: NEGATIVE mg/dL
SPECIFIC GRAVITY, URINE: 1.009 (ref 1.005–1.030)
pH: 7 (ref 5.0–8.0)

## 2015-07-22 LAB — CBC WITH DIFFERENTIAL/PLATELET
BASOS PCT: 0 %
Basophils Absolute: 0 10*3/uL (ref 0.0–0.1)
EOS ABS: 0.1 10*3/uL (ref 0.0–0.7)
Eosinophils Relative: 1 %
HCT: 36.4 % (ref 36.0–46.0)
HEMOGLOBIN: 11.9 g/dL — AB (ref 12.0–15.0)
Lymphocytes Relative: 9 %
Lymphs Abs: 0.6 10*3/uL — ABNORMAL LOW (ref 0.7–4.0)
MCH: 33.3 pg (ref 26.0–34.0)
MCHC: 32.7 g/dL (ref 30.0–36.0)
MCV: 102 fL — ABNORMAL HIGH (ref 78.0–100.0)
MONOS PCT: 6 %
Monocytes Absolute: 0.4 10*3/uL (ref 0.1–1.0)
NEUTROS PCT: 84 %
Neutro Abs: 5.5 10*3/uL (ref 1.7–7.7)
Platelets: 220 10*3/uL (ref 150–400)
RBC: 3.57 MIL/uL — AB (ref 3.87–5.11)
RDW: 17.7 % — ABNORMAL HIGH (ref 11.5–15.5)
WBC: 6.5 10*3/uL (ref 4.0–10.5)

## 2015-07-22 LAB — COMPREHENSIVE METABOLIC PANEL
ALBUMIN: 2.9 g/dL — AB (ref 3.5–5.0)
ALK PHOS: 134 U/L — AB (ref 38–126)
ALT: 26 U/L (ref 14–54)
ANION GAP: 10 (ref 5–15)
AST: 33 U/L (ref 15–41)
BUN: 10 mg/dL (ref 6–20)
CHLORIDE: 99 mmol/L — AB (ref 101–111)
CO2: 22 mmol/L (ref 22–32)
CREATININE: 0.76 mg/dL (ref 0.44–1.00)
Calcium: 9 mg/dL (ref 8.9–10.3)
GFR calc Af Amer: >60 mL/min (ref >60)
GFR calc non Af Amer: >60 mL/min (ref >60)
GLUCOSE: 88 mg/dL (ref 65–99)
POTASSIUM: 5 mmol/L (ref 3.5–5.1)
SODIUM: 131 mmol/L — AB (ref 135–145)
Total Bilirubin: 0.4 mg/dL (ref 0.3–1.2)
Total Protein: 6.1 g/dL — ABNORMAL LOW (ref 6.5–8.1)

## 2015-07-22 LAB — CBC AND DIFFERENTIAL: WBC: 6.5 10*3/mL

## 2015-07-22 MED ORDER — MEMANTINE HCL 10 MG PO TABS
10.0000 mg | ORAL_TABLET | Freq: Two times a day (BID) | ORAL | Status: DC
  Administered 2015-07-23: 10 mg via ORAL
  Filled 2015-07-22: qty 1

## 2015-07-22 MED ORDER — MORPHINE SULFATE (PF) 4 MG/ML IV SOLN
4.0000 mg | Freq: Once | INTRAVENOUS | Status: AC
  Administered 2015-07-22: 4 mg via INTRAVENOUS
  Filled 2015-07-22: qty 1

## 2015-07-22 MED ORDER — SODIUM CHLORIDE 0.9 % IV BOLUS (SEPSIS)
1000.0000 mL | Freq: Once | INTRAVENOUS | Status: AC
  Administered 2015-07-22: 1000 mL via INTRAVENOUS

## 2015-07-22 MED ORDER — ACETAMINOPHEN 325 MG PO TABS: 650.0000 mg | ORAL_TABLET | Freq: Four times a day (QID) | ORAL | Status: DC | PRN

## 2015-07-22 MED ORDER — AMIODARONE HCL 200 MG PO TABS
200.0000 mg | ORAL_TABLET | Freq: Every day | ORAL | Status: DC
  Administered 2015-07-22 – 2015-07-23 (×2): 200 mg via ORAL
  Filled 2015-07-22 (×3): qty 1

## 2015-07-22 MED ORDER — FOLIC ACID 1 MG PO TABS
1.0000 mg | ORAL_TABLET | Freq: Every day | ORAL | Status: DC
  Administered 2015-07-22 – 2015-07-23 (×2): 1 mg via ORAL
  Filled 2015-07-22 (×2): qty 1

## 2015-07-22 MED ORDER — ASPIRIN EC 325 MG PO TBEC
325.0000 mg | DELAYED_RELEASE_TABLET | Freq: Every evening | ORAL | Status: DC
  Administered 2015-07-22: 325 mg via ORAL
  Filled 2015-07-22: qty 1

## 2015-07-22 MED ORDER — HYDROCODONE-ACETAMINOPHEN 5-325 MG PO TABS
1.0000 | ORAL_TABLET | Freq: Four times a day (QID) | ORAL | Status: DC | PRN
Start: 2015-07-22 — End: 2015-07-23
  Administered 2015-07-22: 2 via ORAL
  Administered 2015-07-23: 1 via ORAL
  Filled 2015-07-22: qty 1
  Filled 2015-07-22: qty 2

## 2015-07-22 MED ORDER — TAMSULOSIN HCL 0.4 MG PO CAPS
0.4000 mg | ORAL_CAPSULE | Freq: Every day | ORAL | Status: DC
  Administered 2015-07-22 – 2015-07-23 (×2): 0.4 mg via ORAL
  Filled 2015-07-22 (×2): qty 1

## 2015-07-22 MED ORDER — POLYETHYLENE GLYCOL 3350 17 G PO PACK: 17.0000 g | PACK | Freq: Every day | ORAL | Status: DC | PRN

## 2015-07-22 MED ORDER — DIAZEPAM 2 MG PO TABS: 1.0000 mg | ORAL_TABLET | Freq: Two times a day (BID) | ORAL | Status: DC | PRN

## 2015-07-22 MED ORDER — FERROUS SULFATE 325 (65 FE) MG PO TABS
325.0000 mg | ORAL_TABLET | Freq: Two times a day (BID) | ORAL | Status: DC
  Administered 2015-07-22 – 2015-07-23 (×2): 325 mg via ORAL
  Filled 2015-07-22 (×2): qty 1

## 2015-07-22 MED ORDER — POLYETHYLENE GLYCOL 3350 17 G PO PACK
17.0000 g | PACK | Freq: Every day | ORAL | Status: DC | PRN
Start: 2015-07-22 — End: 2015-07-23
  Administered 2015-07-22: 17 g via ORAL
  Filled 2015-07-22: qty 1

## 2015-07-22 NOTE — Telephone Encounter (Signed)
Yes ok for referral.

## 2015-07-22 NOTE — ED Notes (Signed)
Pt sitting up in bed eating dinner.

## 2015-07-22 NOTE — ED Notes (Signed)
Pt presents via [facility] from home c/i increased BL hip/pelvis pain, lower back pain, and BL thigh pain.  Pt recently has pelvis fx surgery, d/c from rehab x 1 week ago.  Since being d/c home pt has had increased pain and decrease in mobility.  Pt currently taking abx for UTI.  Family reports difficulty finding a "happy medium" with pain medication and constipation.  BP-154/76 P-57 O2-98% RA.  Pt a x 4, NAD.

## 2015-07-22 NOTE — ED Provider Notes (Addendum)
CSN: [id number]     Arrival date & time 07/22/15  1022 History   First MD Initiated Contact with Patient 07/22/15 1031     Chief Complaint  Patient presents with  . Hip Pain  . Back Pain  . Post-op Problem     (Consider location/radiation/quality/duration/timing/severity/associated sxs/prior Treatment) The history is provided by the patient.  Jacqueline [name] is a 82 y.o. female hx of CAD, afib on ASA, dementia, osteoporosis here presenting with back pain, abdominal pain. Patient had a left femur fracture status post surgery in February. She was in rehabilitation until about week ago and now she is currently home. She saw her primary care doctor about 4 days ago and was prescribed Bactrim for UTI And also refilled her hydrocodone. Patient states that she is constipated for the last 4 days. Patient states that she has worsening pain only when she walks but has no pain when she is at rest and she has trouble walking due to pain. Denies any vomiting or urinary symptoms currently. Patient denies any leg numbness or weakness.        Past Medical History  Diagnosis Date  . Diverticulitis   . Hypertensive heart disease   . Osteoporosis   . CAD (coronary artery disease)     a. 08/2012 Abnl MV;  b. 08/2012 CABGx5: LIMA->LAD, VG->Diag, VG->OM1->OM2, VG->RCA.  Marland Kitchen Dementia   . PAF (paroxysmal atrial fibrillation) (HCC)     a. Rhythm mgmt w/ amio;  b. CHA2DS2VASc = 5-->No anticoagulation 2/2 dementia/falls.  Marland Kitchen HLD (hyperlipidemia)   . GERD (gastroesophageal reflux disease)   . Dyslipidemia   . Constipation   . Anorexia   . UTI (lower urinary tract infection)   . Generalized weakness   . Compression fracture of T12 vertebra (HCC)   . Hyponatremia   . Pressure ulcer   . Depression   . Severe muscle deconditioning   . Right knee pain   . Allergic rhinitis   . Protein calorie malnutrition (HCC)   . Cerumen impaction   . Insomnia   . Hearing loss of aging   . Mild aortic stenosis     a.  08/2012 Echo: EF 60-65%, no rwma, mild AS, mildly dil LA, PASP .  Marland Kitchen Unsteady gait   . Physical deconditioning   . Closed intertrochanteric fracture of left femur with routine healing   . Acute blood loss anemia   . Urinary retention   . 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 HLD (hyperlipidemia)   . Anxiety    Past Surgical History  Procedure Laterality Date  . Fracture surgery    . Partial colectomy    . Coronary artery bypass graft N/A 08/25/2012    Procedure: CORONARY ARTERY BYPASS GRAFTING ;  Surgeon: Alleen Borne, MD;  Location: [facility] OR;  Service: Open Heart Surgery;  Laterality: N/A;  four bypasses total   . Left heart catheterization with coronary angiogram N/A 08/22/2012    Procedure: LEFT HEART CATHETERIZATION WITH CORONARY ANGIOGRAM;  Surgeon: Peter M Swaziland, MD;  Location: [facility] CATH LAB;  Service: Cardiovascular;  Laterality: N/A;  . Intramedullary (im) nail intertrochanteric Left 06/02/2015    Procedure: INTRAMEDULLARY (IM) NAIL INTERTROCHANTRIC;  Surgeon: Kathryne Hitch, MD;  Location: [facility] OR;  Service: Orthopedics;  Laterality: Left;   Family History  Problem Relation Age of Onset  . Hypertension Other   . Cancer Mother     stomach  . Diabetes Neg Hx   .  Heart disease Neg Hx   . Stroke Neg Hx    Social History  Substance Use Topics  . Smoking status: Former Smoker    Types: Cigarettes  . Smokeless tobacco: Never Used  . Alcohol Use: No     Comment: Occasional   OB History    No data available     Review of Systems  Musculoskeletal: Positive for back pain.       Leg pain   All other systems reviewed and are negative.     Allergies  Review of patient's allergies indicates no known allergies.  Home Medications   Prior to Admission medications   Medication Sig Start Date End Date Taking? Authorizing Provider  acetaminophen (TYLENOL) 325 MG tablet Take 2 tablets (650 mg total) by mouth every 6 (six)  hours as needed for mild pain or headache. 04/30/15   Zannie Cove, MD  alendronate (FOSAMAX) 70 MG tablet Take 70 mg by mouth once a week. Take with a full glass of water on an empty stomach on Tuesdays    Historical Provider, MD  amiodarone (PACERONE) 200 MG tablet TAKE 1 TABLET BY MOUTH DAILY. 05/10/15   Sheliah Hatch, MD  aspirin EC 325 MG tablet Take 325 mg by mouth every evening.     Historical Provider, MD  atorvastatin (LIPITOR) 40 MG tablet TAKE 1 TABLET BY MOUTH DAILY AT 6 PM 05/30/15   Sheliah Hatch, MD  Calcium Carbonate-Vitamin D3 (RA CALCIUM 600/VITAMIN D-3) 600-400 MG-UNIT TABS Take 1 tablet by mouth 2 (two) times daily.    Historical Provider, MD  diazepam (VALIUM) 2 MG tablet Take 0.5 tablets (1 mg total) by mouth every 12 (twelve) hours as needed for anxiety. 06/06/15   Shanker Levora Dredge, MD  ferrous sulfate 325 (65 FE) MG tablet Take 1 tablet (325 mg total) by mouth 2 (two) times daily with a meal. 06/06/15   Maretta Bees, MD  folic acid (FOLVITE) 1 MG tablet TAKE 1 TABLET BY MOUTH EVERY DAY 04/16/15   Sheliah Hatch, MD  HYDROcodone-acetaminophen (NORCO/VICODIN) 5-325 MG tablet Take 1-2 tablets by mouth every 6 (six) hours as needed for moderate pain. 07/18/15   Grayling Congress Lowne Chase, DO  latanoprost (XALATAN) 0.005 % ophthalmic solution Place 1 drop into both eyes at bedtime.     Historical Provider, MD  memantine (NAMENDA) 10 MG tablet TAKE 1 TABLET (10 MG TOTAL) BY MOUTH 2 (TWO) TIMES DAILY. 02/07/15   Drema Dallas, DO  metoprolol tartrate (LOPRESSOR) 25 MG tablet TAKE 1/2 TABLET (25 MG TOTAL) BY MOUTH 2 (TWO) TIMES DAILY. 07/18/15   Lelon Perla Chase, DO  Multiple Vitamins-Minerals (CERTAVITE SENIOR/ANTIOXIDANT) TABS Take 0.5 tablets by mouth 2 (two) times daily.    Historical Provider, MD  polyethylene glycol (MIRALAX / GLYCOLAX) packet Take 17 g by mouth daily as needed for mild constipation. 06/06/15   Shanker Levora Dredge, MD  Protein (PROCEL) POWD Take 1 scoop by  mouth 2 (two) times daily.    Historical Provider, MD  Simethicone 125 MG CAPS Take 1 capsule by mouth every 8 (eight) hours as needed.    Historical Provider, MD  sulfamethoxazole-trimethoprim (BACTRIM DS,SEPTRA DS) 800-160 MG tablet Take 1 tablet by mouth 2 (two) times daily. 07/18/15   Lelon Perla Chase, DO  tamsulosin (FLOMAX) 0.4 MG CAPS capsule Take 1 capsule (0.4 mg total) by mouth daily. 06/06/15   Maretta Bees, MD   BP 155/71 mmHg  Pulse 58  SpO2 99% Physical Exam  Constitutional: She is oriented to person, place, and time.  Chronically ill, uncomfortable   HENT:  Head: Normocephalic.  Mouth/Throat: Oropharynx is clear and moist.  Eyes: Conjunctivae are normal. Pupils are equal, round, and reactive to light.  Neck: Normal range of motion. Neck supple.  Cardiovascular: Normal rate, regular rhythm and normal heart sounds.   Pulmonary/Chest: Effort normal and breath sounds normal. No respiratory distress. She has no wheezes. She has no rales.  Abdominal: Soft. Bowel sounds are normal.  Mild suprapubic tenderness. No CVAT   Genitourinary:  Rectal- no impaction   Musculoskeletal:  Lower thoracic tenderness, no obvious lumbar tenderness. Pelvis stable, mild L proximal femur tenderness   Neurological: She is alert and oriented to person, place, and time. No cranial nerve deficit. Coordination normal.  Skin: Skin is warm and dry.  Psychiatric: She has a normal mood and affect. Her behavior is normal. Judgment and thought content normal.  Nursing note and vitals reviewed.   ED Course  Procedures (including critical care time) Labs Review Labs Reviewed  CBC WITH DIFFERENTIAL/PLATELET - Abnormal; Notable for the following:    RBC 3.57 (*)    Hemoglobin 11.9 (*)    MCV 102.0 (*)    RDW 17.7 (*)    Lymphs Abs 0.6 (*)    All other components within normal limits  COMPREHENSIVE METABOLIC PANEL - Abnormal; Notable for the following:    Sodium 131 (*)    Chloride 99 (*)     Total Protein 6.1 (*)    Albumin 2.9 (*)    Alkaline Phosphatase 134 (*)    All other components within normal limits  URINALYSIS, ROUTINE W REFLEX MICROSCOPIC (NOT [facility])    Imaging Review Dg Thoracic Spine 2 View  07/22/2015  CLINICAL DATA:  Recent hip surgery. Discharge from rehab 1 week ago. Increasing pelvis and lower back pain. EXAM: THORACIC SPINE 2 VIEWS COMPARISON:  CT chest 04/25/2015 FINDINGS: Severe compression fractures noted at T6 and T12 with mild compression fractures at T10 and T11, stable. Kyphosis. Diffuse osteopenia. No visible acute bony abnormality. IMPRESSION: Multiple chronic compression fractures as above.  No acute findings. Electronically Signed   By: Charlett NoseKevin  Dover M.D.   On: 07/22/2015 13:32   Dg Abd 2 Views  07/22/2015  CLINICAL DATA:  Abdominal pain, low back pain EXAM: ABDOMEN - 2 VIEW COMPARISON:  07/18/2015 FINDINGS: There is normal small bowel gas pattern. IVC filter in place. Abundant stool noted in right colon. Moderate stool and gas noted in sigmoid colon and rectum. Some colonic gas noted in left colon. No free abdominal air. Right hip prosthesis. Partially visualized metallic fixation material left proximal femur. Again noted significant compression deformity of T12 vertebral body. IMPRESSION: Normal small bowel gas pattern. Abundant stool noted in right colon. Moderate colonic gas and stool in sigmoid colon and rectum. No free abdominal air. Electronically Signed   By: Natasha MeadLiviu  Pop M.D.   On: 07/22/2015 13:10   Dg Femur Min 2 Views Left  07/22/2015  CLINICAL DATA:  Left leg pain, pelvis pain. Recent surgery 06/02/2015. EXAM: LEFT FEMUR 2 VIEWS COMPARISON:  06/02/2015 FINDINGS: Left hip screw and femoral intra medullary nail in place. No hardware complicating feature. No acute bony abnormality. Healing left intertrochanteric fracture. IMPRESSION: Healing left intertrochanteric fracture with postoperative changes. No acute bony abnormality. Electronically Signed    By: Charlett NoseKevin  Dover M.D.   On: 07/22/2015 13:13   I have personally reviewed and evaluated these  images and lab results as part of my medical decision-making.   EKG Interpretation None      MDM   Final diagnoses:  Abdominal pain    Jacqueline [name] is a 82 y.o. female here with back pain, pelvic pain, constipation. Symptoms seem chronic and patient has trouble ambulating because of pain. No saddle anesthesia, neurovascular intact. She is on bactrim for UTI. Will check labs, UA, xrays. Will give pain meds.   3:01 PM Xray showed stable thoracic fractures and I called the son. He states that she was initially able to walk with walker but now unable to. She has some constipation as well that can be treated with miralax. No obvious UTI on UA. Na 131 likely dehydration and given NS 1 L bolus. Doesn't meet admission criteria but can't walk so will need rehab. Consulted case management about rehab placement.   4:01 PM Case management consulted physical therapy to determine placement. Ordered home meds including miralax and vicodin. Dr. Clydene Pugh in the ED aware.     Richardean Canal, MD 07/22/15 1503  Richardean Canal, MD 07/22/15 [phone]

## 2015-07-22 NOTE — Telephone Encounter (Signed)
Ref has been placed.     KP 

## 2015-07-22 NOTE — Telephone Encounter (Signed)
Caller name: Viviann SpareSteven Relationship to patient: Son Can be reached: [phone]     Reason for call: Son called stating that patient has been declining all weekend. She can not stand or walk. Son wants the results of her labs done on 4/6 to know if she has low sodium. Plse adv

## 2015-07-22 NOTE — ED Notes (Signed)
Please contact the daughter on cell phone with information, on patient when deciding on treatment options and plan, thanks

## 2015-07-22 NOTE — ED Notes (Signed)
Patient transported to X-ray 

## 2015-07-22 NOTE — ED Notes (Signed)
Dinner tray ordered for pt

## 2015-07-22 NOTE — ED Notes (Signed)
[name]-daughter in law updated on plan of care.  Please update in AM.

## 2015-07-22 NOTE — Telephone Encounter (Signed)
Notes Recorded by Donato SchultzYvonne R Lowne Chase, DO on 07/19/2015 at 8:50 AM There is a compression fracture in low thoracic spine ----- usure if new or old ---  We will forward to Dr Magnus IvanBlackman and if you do not have an appointment soon we will call and get one    I spoke with Jacqueline LandAngela and they are aware of the fracture, she said the patient continue to decline and since she not getting any better Jacqueline [name] is taking her to the hospital. I suggested a Hospice referral since she continues to decline and advised if she get better while they are in the home then they will close the referral. She verbalized understanding and agreed, I made her aware if Jacqueline [name] had any questions he could give me a call back and I would be willing to discuss with him as well. She verbalized understanding and agreed to have him call if he need to discuss further. She wanted to know if the sodium was low and I advised that to was not, however the rest of the labs had not been resulted.   Please advise if it ok to place the referral.   KP

## 2015-07-23 LAB — BASIC METABOLIC PANEL
Anion gap: 10 (ref 5–15)
BUN: 9 mg/dL (ref 4–21)
BUN: 9 mg/dL (ref 6–20)
CALCIUM: 9 mg/dL (ref 8.9–10.3)
CO2: 24 mmol/L (ref 22–32)
CREATININE: 0.82 mg/dL (ref 0.44–1.00)
Chloride: 101 mmol/L (ref 101–111)
Creatinine: 0.8 mg/dL (ref 0.5–1.1)
GFR calc Af Amer: >60 mL/min (ref >60)
GFR calc non Af Amer: >60 mL/min (ref >60)
GLUCOSE: 111 mg/dL — AB (ref 65–99)
Glucose: 111 mg/dL
Potassium: 4.5 mmol/L (ref 3.4–5.3)
Potassium: 4.5 mmol/L (ref 3.5–5.1)
SODIUM: 135 mmol/L — AB (ref 137–147)
Sodium: 135 mmol/L (ref 135–145)

## 2015-07-23 NOTE — ED Notes (Signed)
Paged Mack HookMegan Ritenour, PT to MaconMillie, Charity fundraiserN.

## 2015-07-23 NOTE — ED Notes (Signed)
Pt eating breakfast and tolerating well. Does require assitance to eat due to tremor.

## 2015-07-23 NOTE — Clinical Social Work Placement (Signed)
   CLINICAL SOCIAL WORK PLACEMENT  NOTE  Date:  07/23/2015  Patient Details  Name: Jacqueline [name] MRN: [id number] Date of Birth: [birth year]  Clinical Social Work is seeking post-discharge placement for this patient at the Skilled  Nursing Facility level of care (*CSW will initial, date and re-position this form in  chart as items are completed):  Yes   Patient/family provided with [place] Clinical Social Work Department's list of facilities offering this level of care within the geographic area requested by the patient (or if unable, by the patient's family).  Yes   Patient/family informed of their freedom to choose among providers that offer the needed level of care, that participate in Medicare, Medicaid or managed care program needed by the patient, have an available bed and are willing to accept the patient.  Yes   Patient/family informed of [facility]'s ownership interest in [facility] and [facility], as well as of the fact that they are under no obligation to receive care at these facilities.  PASRR submitted to EDS on 07/23/15     PASRR number received on 07/23/15     Existing PASRR number confirmed on 07/23/15     FL2 transmitted to all facilities in geographic area requested by pt/family on 07/23/15     FL2 transmitted to all facilities within larger geographic area on       Patient informed that his/her managed care company has contracts with or will negotiate with certain facilities, including the following:        Yes   Patient/family informed of bed offers received.  Patient chooses bed at  [facility])     Physician recommends and patient chooses bed at      Patient to be transferred to  [facility]) on 07/23/15.  Patient to be transferred to facility by PTAR     Patient family notified on   of transfer.  Name of family member notified:  Son- Lovenia KimSteven Maloof      PHYSICIAN Please sign FL2, Please  prepare priority discharge summary, including medications     Additional Comment:    _______________________________________________ Rockwell GermanyAshley N Gardner, LCSW 07/23/2015, 2:37 PM

## 2015-07-23 NOTE — ED Notes (Signed)
Patient was given a cup of water. 

## 2015-07-23 NOTE — ED Provider Notes (Signed)
  Physical Exam  BP 148/76 mmHg  Pulse 62  Resp 18  SpO2 97%  Physical Exam  ED Course  Procedures  MDM Patient seen by me yesterday. Pending physical therapy eval for rehab placement. Unable to walk at home or in the ED. Physical therapy to see patient today and decide dispo. Will repeat BMP this AM.   2:34 PM Repeat BMP showed nl Na. Physical therapy saw patient. Patient unable to ambulate and they recommend inpatient rehab. [facility].   Richardean Canalavid H Yao, MD 07/23/15 1435

## 2015-07-23 NOTE — ED Notes (Signed)
[facility]

## 2015-07-23 NOTE — Progress Notes (Signed)
CSW faxed FL2 to [facility] upon request of the facility nursing director/Sharon  Trish MageBrittney Jordell Outten, LCSWA [phone] ED CSW 07/23/2015 4:08 PM

## 2015-07-23 NOTE — ED Notes (Signed)
PT at bedside.

## 2015-07-23 NOTE — Clinical Social Work Note (Signed)
Clinical Social Work Assessment  Patient Details  Name: Jacqueline [name] MRN: [id number] Date of Birth: [birth year]  Date of referral:  07/22/15               Reason for consult:  Facility Placement, Discharge Planning                Permission sought to share information with:  Facility Medical sales representativeContact Representative, Family Supports Permission granted to share information::  Yes, Verbal Permission Granted  Name::     Lovenia KimSteven Mengel [phone])- Son; Edwyna Perfectngela Scearce [phone])- Daughter in [place]  Agency::  [facility] [place]  Relationship::     Contact Information:     Housing/Transportation Living arrangements for the past 2 months:  Skilled Nursing Facility, Single Family Home Source of Information:  Adult Children Patient Interpreter Needed:  None Criminal Activity/Legal Involvement Pertinent to Current Situation/Hospitalization:  No - Comment as needed Significant Relationships:  Adult Children Lives with:  Adult Children Do you feel safe going back to the place where you live?  No Need for family participation in patient care:  Yes (Comment)  Care giving concerns:  Patient's son reports that Patient is unable to walk and get around on her own and notes that he and his wife are unable to provide the help that she needs in the home at this time.    Social Worker assessment / plan:  CSW engaged with Patient's son via T/C to introduce self, role of CSW, and to begin discussion of discharge plan. Patient's son reports that patient was [facility] and within 3 days of being home, patient has been declining all weekend. She cannot stand or walk. Patient's son reports that Patient is unable to walk and get around on her own and notes that he and his wife are unable to provide the help that she needs in the home at this time. Patient's son reports that he would like for Patient to [facility] if possible. Patient's son reports  that he is familiar with SNF placement process from previous admission. CSW has contacted [employer] case Production designer, theatre/television/filmmanager of SNF need. PT has evaluated Patient. CSW will continue to follow for placement.   Employment status:  Retired Database administratornsurance information:  Managed Medicare PT Recommendations:  Skilled Nursing Facility Information / Referral to community resources:  Skilled Nursing Facility  Patient/Family's Response to care:  Patient and family are all agreeable to SNF placement.   Patient/Family's Understanding of and Emotional Response to Diagnosis, Current Treatment, and Prognosis:  Patient's son is able to verbalize strong understanding of her current diagnosis, treatment, and prognosis.   Emotional Assessment Appearance:  Appears stated age Attitude/Demeanor/Rapport:  Unable to Assess Affect (typically observed):  Unable to Assess Orientation:  Fluctuating Orientation (Suspected and/or reported Sundowners) Alcohol / Substance use:  Not Applicable Psych involvement (Current and /or in the community):  No (Comment)  Discharge Needs  Concerns to be addressed:  Discharge Planning Concerns Readmission within the last 30 days:    Current discharge risk:  None Barriers to Discharge:  Insurance Authorization   Rockwell Germanyshley N Gardner, LCSW 07/23/2015, 8:57 AM

## 2015-07-23 NOTE — ED Notes (Signed)
Pt voids 100 cc to bedpan, fresh brief applied.

## 2015-07-23 NOTE — NC FL2 (Signed)
[place] MEDICAID FL2 LEVEL OF CARE SCREENING TOOL     IDENTIFICATION  Patient Name: Jacqueline [name] Birthdate: 1928-08-04 Sex: female Admission Date (Current Location): 07/22/2015  [facility] and [place] Number:  Producer, television/film/video and Address:  [facility] [place], [address] [place], [place] [address]      Provider Number: [id number]  Attending Physician Name and Address:  Provider Default, MD  Relative Name and Phone Number:       Current Level of Care: Hospital Recommended Level of Care: Skilled Nursing Facility Prior Approval Number:    Date Approved/Denied:   PASRR Number: [id number]  Discharge Plan: SNF    Current Diagnoses: Patient Active Problem List   Diagnosis Date Noted  . Diarrhea 06/04/2015  . Protein-calorie malnutrition, severe 06/03/2015  . Closed intertrochanteric fracture of left femur (HCC) 06/02/2015  . Hip fracture (HCC) 06/02/2015  . Falls frequently 06/02/2015  . PAF (paroxysmal atrial fibrillation) (HCC)   . Hypertensive heart disease   . Closed left hip fracture (HCC)   . Surgery, elective   . Community acquired pneumonia 04/26/2015  . Right upper lobe pneumonia 04/25/2015  . Fall 04/23/2015  . Generalized anxiety disorder 01/22/2015  . Anorexia 01/17/2015  . UTI (lower urinary tract infection) 01/16/2015  . Generalized weakness 01/16/2015  . Compression fracture of T12 vertebra (HCC)   . Hyponatremia 10/20/2014  . Pressure ulcer 10/20/2014  . Depression 10/18/2014  . Severe muscle deconditioning 10/18/2014  . Right knee pain 08/20/2014  . Hyperlipidemia 02/05/2014  . Physical exam 02/05/2014  . 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 Allergic rhinitis 10/05/2013  . Loss of weight 10/05/2013  . Protein calorie malnutrition (HCC) 10/05/2013  . SOB (shortness of breath) 08/17/2013  . CAD (coronary artery disease) 01/02/2013  . Paroxysmal atrial fibrillation (HCC) 01/02/2013  .  Aortic stenosis 01/02/2013  . Confusion 11/21/2012  . Pancreatic lesion 11/21/2012  . Constipation, slow transit 11/21/2012  . Cerumen impaction 09/27/2012  . Chest heaviness 08/03/2012  . Abnormal urine odor 08/03/2012  . Insomnia 07/04/2012  . Hearing loss of aging 07/04/2012  . Age-related bone loss 04/28/2012  . HTN (hypertension) 04/28/2012  . Reflux 04/28/2012    Orientation RESPIRATION BLADDER Height & Weight     Self, Place  Normal Incontinent Weight:   Height:     BEHAVIORAL SYMPTOMS/MOOD NEUROLOGICAL BOWEL NUTRITION STATUS      Incontinent Diet  AMBULATORY STATUS COMMUNICATION OF NEEDS Skin   Extensive Assist Verbally Normal                       Personal Care Assistance Level of Assistance  Bathing, Feeding Bathing Assistance: Maximum assistance Feeding assistance: Limited assistance Dressing Assistance: Maximum assistance     Functional Limitations Info  Sight, Hearing, Speech Sight Info: Adequate Hearing Info: Adequate Speech Info: Adequate    SPECIAL CARE FACTORS FREQUENCY  PT (By licensed PT)     PT Frequency: min 2x/week              Contractures Contractures Info: Not present    Additional Factors Info  Allergies, Psychotropic   Allergies Info: NKA Psychotropic Info: Valium         Current Medications (07/23/2015):  This is the current hospital active medication list Current Facility-Administered Medications  Medication Dose Route Frequency Provider Last Rate Last Dose  . acetaminophen (TYLENOL) tablet 650 mg  650 mg Oral Q6H PRN Richardean Canal, MD      .  amiodarone (PACERONE) tablet 200 mg  200 mg Oral Daily Richardean Canalavid H Yao, MD   200 mg at 07/22/15 1833  . aspirin EC tablet 325 mg  325 mg Oral QPM Richardean Canalavid H Yao, MD   325 mg at 07/22/15 1833  . diazepam (VALIUM) tablet 1 mg  1 mg Oral Q12H PRN Richardean Canalavid H Yao, MD      . ferrous sulfate tablet 325 mg  325 mg Oral BID WC Richardean Canalavid H Yao, MD   325 mg at 07/23/15 0930  . folic acid (FOLVITE) tablet  1 mg  1 mg Oral Daily Richardean Canalavid H Yao, MD   1 mg at 07/23/15 1108  . HYDROcodone-acetaminophen (NORCO/VICODIN) 5-325 MG per tablet 1-2 tablet  1-2 tablet Oral Q6H PRN Richardean Canalavid H Yao, MD   1 tablet at 07/23/15 1117  . memantine (NAMENDA) tablet 10 mg  10 mg Oral BID Richardean Canalavid H Yao, MD   10 mg at 07/23/15 1108  . polyethylene glycol (MIRALAX / GLYCOLAX) packet 17 g  17 g Oral Daily PRN Richardean Canalavid H Yao, MD   17 g at 07/22/15 1833  . tamsulosin (FLOMAX) capsule 0.4 mg  0.4 mg Oral Daily Richardean Canalavid H Yao, MD   0.4 mg at 07/23/15 1117   Current Outpatient Prescriptions  Medication Sig Dispense Refill  . acetaminophen (TYLENOL) 325 MG tablet Take 2 tablets (650 mg total) by mouth every 6 (six) hours as needed for mild pain or headache.    . alendronate (FOSAMAX) 70 MG tablet Take 70 mg by mouth once a week. Take with a full glass of water on an empty stomach on Tuesdays    . amiodarone (PACERONE) 200 MG tablet TAKE 1 TABLET BY MOUTH DAILY. 30 tablet 3  . aspirin EC 325 MG tablet Take 325 mg by mouth every evening.     Marland Kitchen. atorvastatin (LIPITOR) 40 MG tablet TAKE 1 TABLET BY MOUTH DAILY AT 6 PM 90 tablet 0  . bimatoprost (LUMIGAN) 0.01 % SOLN Place 1 drop into both eyes at bedtime.    . Calcium Carbonate-Vitamin D3 (RA CALCIUM 600/VITAMIN D-3) 600-400 MG-UNIT TABS Take 1 tablet by mouth 2 (two) times daily.    . diazepam (VALIUM) 2 MG tablet Take 0.5 tablets (1 mg total) by mouth every 12 (twelve) hours as needed for anxiety. 10 tablet 0  . ferrous sulfate 325 (65 FE) MG tablet Take 1 tablet (325 mg total) by mouth 2 (two) times daily with a meal.    . folic acid (FOLVITE) 1 MG tablet TAKE 1 TABLET BY MOUTH EVERY DAY 90 tablet 1  . HYDROcodone-acetaminophen (NORCO/VICODIN) 5-325 MG tablet Take 1-2 tablets by mouth every 6 (six) hours as needed for moderate pain. 30 tablet 0  . memantine (NAMENDA) 10 MG tablet TAKE 1 TABLET (10 MG TOTAL) BY MOUTH 2 (TWO) TIMES DAILY. 60 tablet 9  . metoprolol tartrate (LOPRESSOR) 25 MG tablet  TAKE 1/2 TABLET (25 MG TOTAL) BY MOUTH 2 (TWO) TIMES DAILY. 180 tablet 1  . Multiple Vitamins-Minerals (CERTAVITE SENIOR/ANTIOXIDANT) TABS Take 0.5 tablets by mouth 2 (two) times daily.    . polyethylene glycol (MIRALAX / GLYCOLAX) packet Take 17 g by mouth daily as needed for moderate constipation.    . sulfamethoxazole-trimethoprim (BACTRIM DS,SEPTRA DS) 800-160 MG tablet Take 1 tablet by mouth 2 (two) times daily. 14 tablet 0  . tamsulosin (FLOMAX) 0.4 MG CAPS capsule Take 1 capsule (0.4 mg total) by mouth daily.    . polyethylene glycol (MIRALAX / GLYCOLAX)  packet Take 17 g by mouth daily as needed for mild constipation. 14 each 0     Discharge Medications: Please see discharge summary for a list of discharge medications.  Relevant Imaging Results:  Relevant Lab Results:   Additional Information SSN; [id number]  Rockwell Germany, LCSW

## 2015-07-23 NOTE — ED Notes (Signed)
Pt moved to hospital bed.

## 2015-07-23 NOTE — ED Notes (Signed)
Pt eating lunch and tolerating well. 

## 2015-07-23 NOTE — Progress Notes (Signed)
Patient will DC to: [facility]  Anticipated DC date: 07/23/2015 Family notified: Son- Darryll CapersSteven Turnbell Transport by: Sharin MonsPTAR  CSW to set up transport once a time is received for [facility]. CSW will continue to follow.  Noe GensAshley Gardner, LCSW [facility] Clinical Social Worker [phone]

## 2015-07-23 NOTE — Discharge Instructions (Signed)
Continue vicodin for pain.   Take miralax for constipation.   Stay hydrated.   See your doctor.   Return to ER if you have severe pain, unable to walk, vomiting.

## 2015-07-23 NOTE — Progress Notes (Signed)
Physical Therapy Evaluation  At this time pt is grossly debilitated, very painful, and requiring A for all aspects of mobility.  Pt does not have the level of A she would need at home and would benefit from SNF level of care at D/C.  Will continue to follow while on acute.      07/23/15 0900  PT Visit Information  Last PT Received On 07/23/15  Assistance Needed +2  History of Present Illness pt presents from home with Back and Abdominal Pain.  pt with X-ray showing chronic T6 and T12 Severe compression fxs, and T10 and T11 Mild Compression fxs.  pt with hx of recent L Hip fx s/p ORIF with SNF stay for rehab.  pt with multiple falls since D/C from SNF.  pt with hx of CAD, CABG, A-fib, Dementia, Anxiety, Depression, and HTN.    Precautions  Precautions Fall  Precaution Comments Back Precautions discussed due to Chronic Compression fxs.  Restrictions  Weight Bearing Restrictions No  Home Living  Family/patient expects to be discharged to: Skilled nursing facility  Prior Function  Level of Independence Needs assistance  Gait / Transfers Assistance Needed Used RW, but indicates has not walked for ~1 week  ADL's / Homemaking Assistance Needed Unclear how much pt was able to perform.  Communication  Communication No difficulties  Pain Assessment  Pain Assessment 0-10  Pain Score 5  Pain Location Back and Abdomen  Pain Descriptors / Indicators Aching;Grimacing  Pain Intervention(s) Monitored during session;Premedicated before session;Repositioned  Cognition  Arousal/Alertness Awake/alert  Behavior During Therapy WFL for tasks assessed/performed  Overall Cognitive Status History of cognitive impairments - at baseline  Upper Extremity Assessment  Upper Extremity Assessment Generalized weakness  Lower Extremity Assessment  Lower Extremity Assessment Generalized weakness  Cervical / Trunk Assessment  Cervical / Trunk Assessment Kyphotic  Bed Mobility  Overal bed mobility Needs Assistance   Bed Mobility Rolling;Sidelying to Sit;Sit to Sidelying  Rolling Mod assist  Sidelying to sit Mod assist  Sit to sidelying Max assist  General bed mobility comments pt needs cues for log roll technique and A for all aspects of mobiltiy.    Balance  Overall balance assessment Needs assistance;History of Falls  Sitting-balance support Bilateral upper extremity supported;Feet unsupported  Sitting balance-Leahy Scale Poor  Sitting balance - Comments pt needs A to maintain balance.  PT - End of Session  Activity Tolerance Patient limited by pain  Patient left in bed;with call bell/phone within reach  Nurse Communication Mobility status  PT Assessment  PT Therapy Diagnosis  Difficulty walking;Generalized weakness;Acute pain  PT Recommendation/Assessment Patient needs continued PT services  PT Problem List Decreased strength;Decreased activity tolerance;Decreased balance;Decreased mobility;Decreased coordination;Decreased knowledge of use of DME;Pain  Barriers to Discharge Decreased caregiver support  PT Plan  PT Frequency (ACUTE ONLY) Min 2X/week  PT Treatment/Interventions (ACUTE ONLY) DME instruction;Gait training;Functional mobility training;Therapeutic activities;Therapeutic exercise;Balance training;Patient/family education  PT Recommendation  Follow Up Recommendations SNF  PT equipment None recommended by PT  Individuals Consulted  Consulted and Agree with Results and Recommendations Patient  Acute Rehab PT Goals  Patient Stated Goal No more pain  PT Goal Formulation With patient  Time For Goal Achievement 08/06/15  Potential to Achieve Goals Fair  PT Time Calculation  PT Start Time (ACUTE ONLY) 0845  PT Stop Time (ACUTE ONLY) 0904  PT Time Calculation (min) (ACUTE ONLY) 19 min  PT G-Codes **NOT FOR INPATIENT CLASS**  Functional Assessment Tool Used Clinical Judgement  Functional Limitation Mobility: Walking  and moving around  Mobility: Walking and Moving Around Current Status  [phone](G8978) CK  Mobility: Walking and Moving Around Goal Status (N8295(G8979) CI  PT General Charges  $$ ACUTE PT VISIT 1 Procedure  PT Evaluation  $PT Eval Moderate Complexity 1 Procedure  Written Expression  Dominant Hand Right   Armondo Cech, PT [phone]

## 2015-07-23 NOTE — ED Notes (Signed)
Pt states she may have to BM. Pt offered BSC with plan to change to hospital bed. Pt states she will call when ready to try.

## 2015-07-23 NOTE — ED Notes (Signed)
Ordered patient a regular hospital bed, per [facility]

## 2015-07-23 NOTE — ED Notes (Signed)
Soft diet ordered for patient for breakfast.

## 2015-07-24 ENCOUNTER — Non-Acute Institutional Stay (SKILLED_NURSING_FACILITY): Payer: Commercial Managed Care - HMO | Admitting: Internal Medicine

## 2015-07-24 ENCOUNTER — Encounter: Payer: Self-pay | Admitting: Internal Medicine

## 2015-07-24 DIAGNOSIS — S22000S Wedge compression fracture of unspecified thoracic vertebra, sequela: Secondary | ICD-10-CM | POA: Diagnosis not present

## 2015-07-24 DIAGNOSIS — R5381 Other malaise: Secondary | ICD-10-CM | POA: Diagnosis not present

## 2015-07-24 DIAGNOSIS — N3000 Acute cystitis without hematuria: Secondary | ICD-10-CM | POA: Diagnosis not present

## 2015-07-24 DIAGNOSIS — E46 Unspecified protein-calorie malnutrition: Secondary | ICD-10-CM

## 2015-07-24 DIAGNOSIS — I480 Paroxysmal atrial fibrillation: Secondary | ICD-10-CM

## 2015-07-24 DIAGNOSIS — F0390 Unspecified dementia without behavioral disturbance: Secondary | ICD-10-CM

## 2015-07-24 DIAGNOSIS — S72142S Displaced intertrochanteric fracture of left femur, sequela: Secondary | ICD-10-CM

## 2015-07-24 DIAGNOSIS — D62 Acute posthemorrhagic anemia: Secondary | ICD-10-CM | POA: Diagnosis not present

## 2015-07-24 DIAGNOSIS — E785 Hyperlipidemia, unspecified: Secondary | ICD-10-CM

## 2015-07-24 DIAGNOSIS — I10 Essential (primary) hypertension: Secondary | ICD-10-CM | POA: Diagnosis not present

## 2015-07-24 DIAGNOSIS — M810 Age-related osteoporosis without current pathological fracture: Secondary | ICD-10-CM

## 2015-07-24 DIAGNOSIS — K5900 Constipation, unspecified: Secondary | ICD-10-CM

## 2015-07-24 NOTE — Progress Notes (Signed)
LOCATION: [facility]  PCP: [name], DO   Code Status: DNR  Goals of care: Advanced Directive information Advanced Directives 07/24/2015  Does patient have an advance directive? Yes  Type of Advance Directive Out of facility DNR (pink MDonato SchultzST or yellow form)  Does patient want to make changes to advanced directive? No - Patient declined  Copy of advanced directive(s) in chart? Yes       Extended Emergency Contact Information Primary Emergency Contact: Kotecki,Steven Address: [address]           [place], [place] [address] [place] Home Phone: [phone] Mobile Phone: [phone] Relation: Son Secondary Emergency Contact: Edwyna Perfecturnbull,[name] Address: [address]          [place], [place] [address] [place] Home Phone: [phone] Mobile Phone: [phone] Relation: Daughter   No Known Allergies  Chief Complaint  Patient presents with  . New Admit To SNF    New Admission     HPI:  Patient is a 82 y.o. female seen today for short term rehabilitation post ED visit on 07/22/15-07/23/15 with inability to perform her ADLs at home with limited mobility. She had imaging showing severe compression fracture at T6 and T12 and mild compression fracture at T10 and T11. No dislocation noted to left hip/ femur area. She was here recently for short term rehabilitation post hospital admission from 05/27/15-06/06/15 with left femur closed fracture post fall. She underwent ORIF on 06/02/15. She was discharged from the facility recently with home health services. She has PMH of CAD, HTN, Aortic stenosis, afib, dementia among others. She is seen in her room today. She complaints of pain to her hip area and lower back. She has not taken any pain medication this am. She does not feel comfortable.   Review of Systems:  Constitutional: Negative for fever, chills . Has low energy level. HENT: Negative for headache, congestion, nasal discharge. Positive for  difficulty swallowing.   Eyes: Negative for blurred vision, double vision and discharge.  Respiratory: Negative for cough and wheezing. Positive for shortness of breath with exertion.  Cardiovascular: Negative for chest pain, palpitations, leg swelling.  Gastrointestinal: Negative for heartburn, nausea, vomiting, abdominal pain. Last bowel movement was 4 days ago.  Genitourinary: Negative for dysuria.  Musculoskeletal: Negative for fall in the facility. Positive for lower back pain.  Skin: Negative for itching, rash.  Neurological: Positive for dizziness with change of position Psychiatric/Behavioral: Negative for depression   Past Medical History  Diagnosis Date  . Diverticulitis   . Hypertensive heart disease   . Osteoporosis   . CAD (coronary artery disease)     a. 08/2012 Abnl MV;  b. 08/2012 CABGx5: LIMA->LAD, VG->Diag, VG->OM1->OM2, VG->RCA.  Marland Kitchen. Dementia   . PAF (paroxysmal atrial fibrillation) (HCC)     a. Rhythm mgmt w/ amio;  b. CHA2DS2VASc = 5-->No anticoagulation 2/2 dementia/falls.  Marland Kitchen. HLD (hyperlipidemia)   . GERD (gastroesophageal reflux disease)   . Dyslipidemia   . Constipation   . Anorexia   . UTI (lower urinary tract infection)   . Generalized weakness   . Compression fracture of T12 vertebra (HCC)   . Hyponatremia   . Pressure ulcer   . Depression   . Severe muscle deconditioning   . Right knee pain   . Allergic rhinitis   . Protein calorie malnutrition (HCC)   . Cerumen impaction   . Insomnia   . Hearing loss of aging   . Mild  aortic stenosis     a. 08/2012 Echo: EF 60-65%, no rwma, mild AS, mildly dil LA, PASP .  Marland Kitchen Unsteady gait   . Physical deconditioning   . Closed intertrochanteric fracture of left femur with routine healing   . Acute blood loss anemia   . Urinary retention   . 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 HLD (hyperlipidemia)   . Anxiety    Past Surgical History  Procedure Laterality Date   . Fracture surgery    . Partial colectomy    . Coronary artery bypass graft N/A 08/25/2012    Procedure: CORONARY ARTERY BYPASS GRAFTING ;  Surgeon: Alleen Borne, MD;  Location: [facility] OR;  Service: Open Heart Surgery;  Laterality: N/A;  four bypasses total   . Left heart catheterization with coronary angiogram N/A 08/22/2012    Procedure: LEFT HEART CATHETERIZATION WITH CORONARY ANGIOGRAM;  Surgeon: Peter M Swaziland, MD;  Location: [facility] CATH LAB;  Service: Cardiovascular;  Laterality: N/A;  . Intramedullary (im) nail intertrochanteric Left 06/02/2015    Procedure: INTRAMEDULLARY (IM) NAIL INTERTROCHANTRIC;  Surgeon: Kathryne Hitch, MD;  Location: [facility] OR;  Service: Orthopedics;  Laterality: Left;   Social History:   reports that she has quit smoking. Her smoking use included Cigarettes. She has never used smokeless tobacco. She reports that she does not drink alcohol or use illicit drugs.  Family History  Problem Relation Age of Onset  . Hypertension Other   . Cancer Mother     stomach  . Diabetes Neg Hx   . Heart disease Neg Hx   . Stroke Neg Hx     Medications:   Medication List       This list is accurate as of: 07/24/15  2:41 PM.  Always use your most recent med list.               acetaminophen 325 MG tablet  Commonly known as:  TYLENOL  Take 2 tablets (650 mg total) by mouth every 6 (six) hours as needed for mild pain or headache.     alendronate 70 MG tablet  Commonly known as:  FOSAMAX  Take 70 mg by mouth once a week. Take with a full glass of water on an empty stomach on Tuesdays     amiodarone 200 MG tablet  Commonly known as:  PACERONE  TAKE 1 TABLET BY MOUTH DAILY.     aspirin EC 325 MG tablet  Take 325 mg by mouth every evening.     atorvastatin 40 MG tablet  Commonly known as:  LIPITOR  TAKE 1 TABLET BY MOUTH DAILY AT 6 PM     CERTAVITE SENIOR/ANTIOXIDANT Tabs  Take 0.5 tablets by mouth 2 (two) times daily.     diazepam 2 MG tablet  Commonly known  as:  VALIUM  Take 0.5 tablets (1 mg total) by mouth every 12 (twelve) hours as needed for anxiety.     ferrous sulfate 325 (65 FE) MG tablet  Take 1 tablet (325 mg total) by mouth 2 (two) times daily with a meal.     folic acid 1 MG tablet  Commonly known as:  FOLVITE  TAKE 1 TABLET BY MOUTH EVERY DAY     HYDROcodone-acetaminophen 5-325 MG tablet  Commonly known as:  NORCO/VICODIN  Take 1-2 tablets by mouth every 6 (six) hours as needed for moderate pain.     latanoprost 0.005 % ophthalmic solution  Commonly known as:  Harrel Lemon  Place 1 drop into both eyes at bedtime.     memantine 10 MG tablet  Commonly known as:  NAMENDA  TAKE 1 TABLET (10 MG TOTAL) BY MOUTH 2 (TWO) TIMES DAILY.     metoprolol tartrate 25 MG tablet  Commonly known as:  LOPRESSOR  TAKE 1/2 TABLET (25 MG TOTAL) BY MOUTH 2 (TWO) TIMES DAILY.     polyethylene glycol packet  Commonly known as:  MIRALAX / GLYCOLAX  Take 17 g by mouth daily as needed for mild constipation.     PROCEL Powd  Take 1 scoop by mouth 2 (two) times daily.     RA CALCIUM 600/VITAMIN D-3 600-400 MG-UNIT Tabs  Generic drug:  Calcium Carbonate-Vitamin D3  Take 1 tablet by mouth 2 (two) times daily.     simethicone 125 MG chewable tablet  Commonly known as:  MYLICON  Chew 125 mg by mouth every 8 (eight) hours as needed for flatulence.     sulfamethoxazole-trimethoprim 800-160 MG tablet  Commonly known as:  BACTRIM DS,SEPTRA DS  Take 1 tablet by mouth 2 (two) times daily.     tamsulosin 0.4 MG Caps capsule  Commonly known as:  FLOMAX  Take 1 capsule (0.4 mg total) by mouth daily.        Immunizations: Immunization History  Administered Date(s) Administered  . Influenza,inj,Quad PF,36+ Mos 02/05/2014, 04/16/2015  . Influenza-Unspecified 04/14/2011  . PPD Test 04/30/2015, 05/14/2015, 06/06/2015  . Pneumococcal Conjugate-13 02/05/2014  . Pneumococcal-Unspecified 04/14/2011  . Tdap 04/17/2015     Physical Exam: Filed Vitals:    07/24/15 1432  BP: 150/73  Pulse: 79  Temp: 98.2 F (36.8 C)  TempSrc: Oral  Resp: 18  Height: 5\' 5"  (1.651 m)  Weight: 110 lb (49.896 kg)  SpO2: 97%   Body mass index is 18.31 kg/(m^2).  General- elderly female, frail and thin built, in no acute distress Head- normocephalic, atraumatic Nose- no maxillary or frontal sinus tenderness, no nasal discharge Throat- moist mucus membrane Eyes- PERRLA, EOMI, no pallor, no icterus Neck- no cervical lymphadenopathy Cardiovascular- normal s1,s2, + murmur Respiratory- bilateral clear to auscultation, no wheeze, no rhonchi, no crackles, no use of accessory muscles Abdomen- bowel sounds present, soft, non tender Musculoskeletal- able to move all 4 extremities, limited ROM to left leg Neurological- alert and oriented to person, place and time Skin- warm and dry, bruise to both arms Psychiatry- anxiety present    Labs reviewed: Basic Metabolic Panel:  Recent Labs  16/10/96 1138  07/18/15 1407 07/22/15 1210 07/23/15 07/23/15 0932  NA 136  < > 136 131* 135* 135  K 4.0  < > 5.8* 5.0 4.5 4.5  CL 99  < > 103 99*  --  101  CO2 31  < > 28 22  --  24  GLUCOSE 75  < > 123* 88  --  111*  BUN 15  < > 16 10 9 9   CREATININE 0.66  < > 0.70 0.76 0.8 0.82  CALCIUM 9.5  < > 9.2 9.0  --  9.0  MG 1.9  --   --   --   --   --   < > = values in this interval not displayed. Liver Function Tests:  Recent Labs  06/01/15 2226 07/18/15 1407 07/22/15 1210  AST 52* 40* 33  ALT 38 29 26  ALKPHOS 98 152* 134*  BILITOT 0.6 0.6 0.4  PROT 6.5 6.7 6.1*  ALBUMIN 3.2* 3.3* 2.9*    Recent Labs  01/12/15 0750 01/16/15 0245 04/25/15 1430  LIPASE 38 35 53*   No results for input(s): AMMONIA in the last 8760 hours. CBC:  Recent Labs  06/06/15 0520  07/08/15 07/18/15 1407 07/22/15 07/22/15 1210  WBC 7.4  < > 5.4 8.3 6.5 6.5  NEUTROABS  --   < > 4 7.3  --  5.5  HGB 8.9*  < > 11.8* 12.4  --  11.9*  HCT 27.8*  < > 38 37.7  --  36.4  MCV 97.2   --   --  100.8*  --  102.0*  PLT 194  < > 295 264.0  --  220  < > = values in this interval not displayed. Cardiac Enzymes:  Recent Labs  01/16/15 0947 01/16/15 1540 04/25/15 1430  TROPONINI <0.03 <0.03 <0.03   BNP: Invalid input(s): POCBNP CBG:  Recent Labs  04/25/15 1451  GLUCAP 140*    Radiological Exams: Dg Thoracic Spine 2 View  07/22/2015  CLINICAL DATA:  Recent hip surgery. Discharge from rehab 1 week ago. Increasing pelvis and lower back pain. EXAM: THORACIC SPINE 2 VIEWS COMPARISON:  CT chest 04/25/2015 FINDINGS: Severe compression fractures noted at T6 and T12 with mild compression fractures at T10 and T11, stable. Kyphosis. Diffuse osteopenia. No visible acute bony abnormality. IMPRESSION: Multiple chronic compression fractures as above.  No acute findings. Electronically Signed   By: Charlett Nose M.D.   On: 07/22/2015 13:32   Dg Lumbar Spine Complete  07/18/2015  CLINICAL DATA:  Low back pain, pelvic pain for 3 days EXAM: LUMBAR SPINE - COMPLETE 4+ VIEW COMPARISON:  CT scan 04/25/2015 FINDINGS: Five views of lumbar spine submitted. There is diffuse osteopenia. IVC filter in place again noted. Atherosclerotic calcifications of abdominal aorta. Again noted significant compression deformity T12 vertebral body. There is interval mild compression deformity upper endplate of T11 and T10 vertebral bodies of indeterminate age. Clinical correlation is necessary. Mild lumbar levoscoliosis. Partially visualized right hip prosthesis. IMPRESSION: Diffuse osteopenia. No lumbar spine acute fracture or subluxation. Again noted significant compression fracture of T12 vertebral body. There is interval mild compression deformity upper endplate of T11 and T10 vertebral bodies of indeterminate age. Clinical correlation is necessary. Electronically Signed   By: Natasha Mead M.D.   On: 07/18/2015 20:13   Dg Abd 2 Views  07/22/2015  CLINICAL DATA:  Abdominal pain, low back pain EXAM: ABDOMEN - 2 VIEW  COMPARISON:  07/18/2015 FINDINGS: There is normal small bowel gas pattern. IVC filter in place. Abundant stool noted in right colon. Moderate stool and gas noted in sigmoid colon and rectum. Some colonic gas noted in left colon. No free abdominal air. Right hip prosthesis. Partially visualized metallic fixation material left proximal femur. Again noted significant compression deformity of T12 vertebral body. IMPRESSION: Normal small bowel gas pattern. Abundant stool noted in right colon. Moderate colonic gas and stool in sigmoid colon and rectum. No free abdominal air. Electronically Signed   By: Natasha Mead M.D.   On: 07/22/2015 13:10   Dg Hip Unilat With Pelvis 2-3 Views Left  07/18/2015  CLINICAL DATA:  Pain low back and pelvis for 3-4 days EXAM: DG HIP (WITH OR WITHOUT PELVIS) 2-3V LEFT COMPARISON:  06/01/2015 FINDINGS: Intra medullary nail left femur. No change total right hip replacement. Stable sacroiliitis and pubic symphysitis. No evidence of pelvic bone fracture. On the left, there is improved positioning and alignment at the site of intertrochanteric left femur fracture as compared to 06/01/2015. Fracture line remains  visible. IMPRESSION: No acute findings. Electronically Signed   By: Esperanza Heir M.D.   On: 07/18/2015 20:15   Dg Femur Min 2 Views Left  07/22/2015  CLINICAL DATA:  Left leg pain, pelvis pain. Recent surgery 06/02/2015. EXAM: LEFT FEMUR 2 VIEWS COMPARISON:  06/02/2015 FINDINGS: Left hip screw and femoral intra medullary nail in place. No hardware complicating feature. No acute bony abnormality. Healing left intertrochanteric fracture. IMPRESSION: Healing left intertrochanteric fracture with postoperative changes. No acute bony abnormality. Electronically Signed   By: Charlett Nose M.D.   On: 07/22/2015 13:13    Assessment/Plan  Physical deconditioning Will have her work with physical therapy and occupational therapy team to help with gait training and muscle strengthening  exercises.fall precautions. Skin care. Encourage to be out of bed.   Thoracic compression fracture Continue norco 5-325 mg 1-2 tab q6h prn pain and valium prn for muscle spasm. Will have patient work with PT/OT as tolerated to regain strength and restore function.  Fall precautions are in place.  Osteoporosis Continue weekly fosamax with oscal with vitamin d  Closed intertrochanteric fracture of left femur  S/P ORIF. WBAT. Continue norco 5-325 mg 1-2 tab q6h prn pain. Continue aspirin EC 325 mg daily for DVT prophylaxis. To work with therapy team  Blood loss anemia Post op, monitor cbc and continue ferrous sulfate bid  Constipation Change miralax to 17 g daily and add senokot s 2 tab qhs and monitor  ESBL UTI Treat with bactrim ds 1 tab q12h  Until 07/30/15, hdyration encouraged  Protein calorie malnutrition Continue procel supplement, encourage po intake  afib Rate controlled. Continue lopressor 25 mg bid, pacerone and ASA  HLD Continue lipitor  Hypertension  Stable, monitor BP. continue metoprolol tartrate 25 mg bid  Dementia continue Namenda 10 mg bid, assistance with ADLs. High fall risk- fall precautions     Goals of care: short term rehabilitation but possible long term care   Labs/tests ordered: cbc, cmp in 1 week   Family/ staff Communication: reviewed care plan with patient and nursing supervisor    Oneal Grout, MD Internal Medicine [facility] [address] [place], [place] [address] Cell Phone (Monday-Friday 8 am - 5 pm): [phone] On Call: [phone] and follow prompts after 5 pm and on weekends Office Phone: [phone] Office Fax: [phone]

## 2015-07-31 LAB — BASIC METABOLIC PANEL
BUN: 17 mg/dL (ref 4–21)
Creatinine: 0.8 mg/dL (ref 0.5–1.1)
Glucose: 79 mg/dL
POTASSIUM: 5 mmol/L (ref 3.4–5.3)
SODIUM: 130 mmol/L — AB (ref 137–147)

## 2015-07-31 LAB — CBC AND DIFFERENTIAL
HEMATOCRIT: 33 % — AB (ref 36–46)
Hemoglobin: 10.5 g/dL — AB (ref 12.0–16.0)
NEUTROS ABS: 4 /uL
Platelets: 245 10*3/uL (ref 150–399)
WBC: 5.6 10^3/mL

## 2015-07-31 LAB — HEPATIC FUNCTION PANEL
ALK PHOS: 193 U/L — AB (ref 25–125)
ALT: 52 U/L — AB (ref 7–35)
AST: 57 U/L — AB (ref 13–35)
BILIRUBIN, TOTAL: 0.3 mg/dL

## 2015-08-01 ENCOUNTER — Non-Acute Institutional Stay: Payer: Commercial Managed Care - HMO | Admitting: Adult Health

## 2015-08-01 ENCOUNTER — Encounter: Payer: Self-pay | Admitting: Adult Health

## 2015-08-01 DIAGNOSIS — E43 Unspecified severe protein-calorie malnutrition: Secondary | ICD-10-CM | POA: Diagnosis not present

## 2015-08-01 DIAGNOSIS — E871 Hypo-osmolality and hyponatremia: Secondary | ICD-10-CM | POA: Diagnosis not present

## 2015-08-01 NOTE — Progress Notes (Signed)
Patient ID: Jacqueline [name], female   DOB: 1929/01/24, 82 y.o.   MRN: [id number]    DATE:  08/01/15  MRN:  [id number]  BIRTHDAY: 1928-05-13  Facility:  Nursing Home Location:  [facility]  Nursing Home Room Number: 102-P  LEVEL OF CARE:  SNF (31)  Contact Information    Name Relation Home Work [place] Son [phone]  [phone]   Rechelle, [place] Daughter [phone]       Code Status History    Date Active Date Inactive Code Status Order ID Comments User Context   06/02/2015  3:58 AM 06/06/2015  8:12 PM DNR [id number]  Alberteen Sam, MD ED   04/25/2015  9:56 PM 04/30/2015  9:46 PM DNR [id number]  Hillary Bow, DO Inpatient   04/25/2015  9:50 PM 04/25/2015  9:56 PM Full Code [id number]  Hillary Bow, DO Inpatient   01/16/2015 12:46 AM 01/18/2015  4:33 PM DNR [id number]  Lorretta Harp, MD ED   10/20/2014  1:44 PM 10/23/2014  7:22 PM DNR [id number]  Leatha Gilding, MD ED   08/29/2012 11:50 AM 09/02/2012  4:24 PM Full Code [id number]  Alleen Borne, MD Inpatient   08/25/2012  1:43 PM 08/29/2012 11:50 AM Full Code [id number]  Rowe Clack, PA-C Inpatient    Questions for Most Recent Historical Code Status (Order [id number])    Question Answer Comment   In the event of cardiac or respiratory ARREST Do not call a "code blue"    In the event of cardiac or respiratory ARREST Do not perform Intubation, CPR, defibrillation or ACLS    In the event of cardiac or respiratory ARREST Use medication by any route, position, wound care, and other measures to relive pain and suffering. May use oxygen, suction and manual treatment of airway obstruction as needed for comfort.     Advance Directive Documentation        Most Recent Value   Type of Advance Directive  Out of facility DNR (pink MOST or yellow form)   Pre-existing out of facility DNR order (yellow form or pink MOST form)     "MOST" Form in Place?         Chief Complaint   Patient presents with  . Acute Visit    Protein calorie malnutrition, hyponatremia    HISTORY OF PRESENT ILLNESS:  This is an 82 year old female who was noted to have albumin 2.88 and NA 130. No complaints of muscle weakness.  She has been admitted to [facility] on 07/23/15 from [facility]. She has PMH of CAD, hypertension,  aortic stenosis, dementia and atrial fibrillation. She went to ED recently due to  inability to perform her ADLs at home with limited mobility. She had imaging showing severe compression fracture at T6 and T12 and mild compression fracture at T10 and T11. No dislocation noted to left hip/ femur area. She was here recently for short term rehabilitation post hospital admission from 05/27/15-06/06/15 with left femur closed fracture post fall. She underwent ORIF on 06/02/15. She was discharged from the facility recently with home health services.  PAST MEDICAL HISTORY:  Past Medical History  Diagnosis Date  . Diverticulitis   . Hypertensive heart disease   . Osteoporosis   . CAD (coronary artery disease)     a. 08/2012 Abnl MV;  b. 08/2012 CABGx5: LIMA->LAD, VG->Diag, VG->OM1->OM2, VG->RCA.  Marland Kitchen Dementia   . PAF (paroxysmal atrial fibrillation) (HCC)  a. Rhythm mgmt w/ amio;  b. CHA2DS2VASc = 5-->No anticoagulation 2/2 dementia/falls.  Marland Kitchen HLD (hyperlipidemia)   . GERD (gastroesophageal reflux disease)   . Dyslipidemia   . Constipation   . Anorexia   . UTI (lower urinary tract infection)   . Generalized weakness   . Compression fracture of T12 vertebra (HCC)   . Hyponatremia   . Pressure ulcer   . Depression   . Severe muscle deconditioning   . Right knee pain   . Allergic rhinitis   . Protein calorie malnutrition (HCC)   . Cerumen impaction   . Insomnia   . Hearing loss of aging   . Mild aortic stenosis     a. 08/2012 Echo: EF 60-65%, no rwma, mild AS, mildly dil LA, PASP .  Marland Kitchen Unsteady gait   . Physical deconditioning   . Closed intertrochanteric  fracture of left femur with routine healing   . Acute blood loss anemia   . Urinary retention   . 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 HLD (hyperlipidemia)   . Anxiety      CURRENT MEDICATIONS: Reviewed  Patient's Medications  New Prescriptions   No medications on file  Previous Medications   ACETAMINOPHEN (TYLENOL) 325 MG TABLET    Take 2 tablets (650 mg total) by mouth every 6 (six) hours as needed for mild pain or headache.   AMIODARONE (PACERONE) 200 MG TABLET    TAKE 1 TABLET BY MOUTH DAILY.   ASPIRIN EC 325 MG TABLET    Take 325 mg by mouth every evening.    ATORVASTATIN (LIPITOR) 40 MG TABLET    TAKE 1 TABLET BY MOUTH DAILY AT 6 PM   CALCIUM CARBONATE-VITAMIN D3 (RA CALCIUM 600/VITAMIN D-3) 600-400 MG-UNIT TABS    Take 1 tablet by mouth 2 (two) times daily.   DIAZEPAM (VALIUM) 2 MG TABLET    Take 0.5 tablets (1 mg total) by mouth every 12 (twelve) hours as needed for anxiety.   FERROUS SULFATE 325 (65 FE) MG TABLET    Take 325 mg by mouth 3 (three) times daily with meals.   FOLIC ACID (FOLVITE) 1 MG TABLET    TAKE 1 TABLET BY MOUTH EVERY DAY   HYDROCODONE-ACETAMINOPHEN (NORCO/VICODIN) 5-325 MG TABLET    Take 1-2 tablets by mouth every 6 (six) hours as needed for moderate pain.   LATANOPROST (XALATAN) 0.005 % OPHTHALMIC SOLUTION    Place 1 drop into both eyes at bedtime.   MEMANTINE (NAMENDA) 10 MG TABLET    TAKE 1 TABLET (10 MG TOTAL) BY MOUTH 2 (TWO) TIMES DAILY.   METOPROLOL TARTRATE (LOPRESSOR) 25 MG TABLET    Take 25 mg by mouth 2 (two) times daily. Hold for SBP <110 and HR <60   MULTIPLE VITAMINS-MINERALS (CERTAVITE SENIOR/ANTIOXIDANT) TABS    Take 0.5 tablets by mouth 2 (two) times daily.   PROMETHAZINE (PHENERGAN) 12.5 MG TABLET    Take 12.5 mg by mouth every 6 (six) hours as needed for nausea or vomiting. Q.6h. X 24h   PROTEIN (PROCEL) POWD    Take 1 scoop by mouth 2 (two) times daily.   SENNOSIDES-DOCUSATE SODIUM (SENOKOT-S)  8.6-50 MG TABLET    Take 1 tablet by mouth at bedtime.    SIMETHICONE (MYLICON) 125 MG CHEWABLE TABLET    Chew 125 mg by mouth every 8 (eight) hours as needed.    SODIUM CHLORIDE PO    Take 1,000 mg by mouth daily.  TAMSULOSIN (FLOMAX) 0.4 MG CAPS CAPSULE    Take 1 capsule (0.4 mg total) by mouth daily.   UNABLE TO FIND    Med Name: Med pass 90 mL by mouth 2 times daily  Modified Medications   Modified Medication Previous Medication   ALENDRONATE (FOSAMAX) 70 MG TABLET alendronate (FOSAMAX) 70 MG tablet      Take 1 tablet (70 mg total) by mouth once a week. Take with a full glass of water on an empty stomach on Tuesdays    Take 70 mg by mouth once a week. Take with a full glass of water on an empty stomach on Tuesdays  Discontinued Medications   ALPRAZOLAM (XANAX) 2 MG TABLET    Take 1 mg by mouth 2 (two) times daily. Take 1/2 tablet BID   FERROUS SULFATE 325 (65 FE) MG TABLET    Take 1 tablet (325 mg total) by mouth 2 (two) times daily with a meal.   METOPROLOL TARTRATE (LOPRESSOR) 25 MG TABLET    TAKE 1/2 TABLET (25 MG TOTAL) BY MOUTH 2 (TWO) TIMES DAILY.   POLYETHYLENE GLYCOL (MIRALAX / GLYCOLAX) PACKET    Take 17 g by mouth daily as needed for mild constipation.   SIMETHICONE (MYLICON) 125 MG CHEWABLE TABLET    Chew 125 mg by mouth every 8 (eight) hours as needed for flatulence.   SULFAMETHOXAZOLE-TRIMETHOPRIM (BACTRIM DS,SEPTRA DS) 800-160 MG TABLET    Take 1 tablet by mouth 2 (two) times daily.     No Known Allergies   REVIEW OF SYSTEMS:  GENERAL: no change in appetite, no fatigue, no weight changes, no fever, chills or weakness EYES: Denies change in vision, dry eyes, eye pain, itching or discharge EARS: Denies change in hearing, ringing in ears, or earache NOSE: Denies nasal congestion or epistaxis MOUTH and THROAT: Denies oral discomfort, gingival pain or bleeding, pain from teeth or hoarseness   RESPIRATORY: no cough, SOB, DOE, wheezing, hemoptysis CARDIAC: no chest pain,  edema or palpitations GI: no abdominal pain, diarrhea, constipation, heart burn, nausea or vomiting GU: Denies dysuria, frequency, hematuria, incontinence, or discharge PSYCHIATRIC: Denies feeling of depression or anxiety. No report of hallucinations, insomnia, paranoia, or agitation   PHYSICAL EXAMINATION  GENERAL APPEARANCE: Well nourished. In no acute distress. Normal body habitus SKIN:  Skin is warm and dry.  HEAD: Normal in size and contour. No evidence of trauma EYES: Lids open and close normally. No blepharitis, entropion or ectropion. PERRL. Conjunctivae are clear and sclerae are white. Lenses are without opacity EARS: Pinnae are normal. Patient hears normal voice tunes of the examiner MOUTH and THROAT: Lips are without lesions. Oral mucosa is moist and without lesions. Tongue is normal in shape, size, and color and without lesions NECK: supple, trachea midline, no neck masses, no thyroid tenderness, no thyromegaly LYMPHATICS: no LAN in the neck, no supraclavicular LAN RESPIRATORY: breathing is even & unlabored, BS CTAB CARDIAC: RRR, + murmur,no extra heart sounds, no edema GI: abdomen soft, normal BS, no masses, no tenderness, no hepatomegaly, no splenomegaly EXTREMITIES:  Able to move X 4 extremities PSYCHIATRIC: Alert and oriented X 3. Affect and behavior are appropriate  LABS/RADIOLOGY: Labs reviewed: Basic Metabolic Panel:  Recent Labs  16/10/96 1138  07/18/15 1407 07/22/15 1210  07/23/15 0932 07/31/15   NA 136  < > 136 131*  < > 135 130*   K 4.0  < > 5.8* 5.0  < > 4.5 5.0   CL 99  < > 103 99*  --  101  --    CO2 31  < > 28 22  --  24  --    GLUCOSE 75  < > 123* 88  --  111*  --    BUN 15  < > 16 10  < > 9 17   CREATININE 0.66  < > 0.70 0.76  < > 0.82 0.8   CALCIUM 9.5  < > 9.2 9.0  --  9.0  --    MG 1.9  --   --   --   --   --   --    < > = values in this interval not displayed. Liver Function Tests:  Recent Labs  06/01/15 2226 07/18/15 1407 07/22/15 1210  07/31/15  AST 52* 40* 33 57*  ALT 38 29 26 52*  ALKPHOS 98 152* 134* 193*  BILITOT 0.6 0.6 0.4  --   PROT 6.5 6.7 6.1*  --   ALBUMIN 3.2* 3.3* 2.9*  --     Recent Labs  01/12/15 0750 01/16/15 0245 04/25/15 1430  LIPASE 38 35 53*   No results for input(s): AMMONIA in the last 8760 hours. CBC:  Recent Labs  06/06/15 0520  07/18/15 1407 07/22/15 07/22/15 1210 07/31/15  WBC 7.4  < > 8.3 6.5 6.5 5.6  NEUTROABS  --   < > 7.3  --  5.5 4  HGB 8.9*  < > 12.4  --  11.9* 10.5*  HCT 27.8*  < > 37.7  --  36.4 33*  MCV 97.2  --  100.8*  --  102.0*  --   PLT 194  < > 264.0  --  220 245  < > = values in this interval not displayed.  Lipid Panel:  Recent Labs  01/22/15 1138 05/31/15 1055  HDL 49.30 42.70   Cardiac Enzymes:  Recent Labs  01/16/15 0947 01/16/15 1540 04/25/15 1430  TROPONINI <0.03 <0.03 <0.03   CBG:  Recent Labs  04/25/15 1451  GLUCAP 140*      Dg Thoracic Spine 2 View  07/22/2015  CLINICAL DATA:  Recent hip surgery. Discharge from rehab 1 week ago. Increasing pelvis and lower back pain. EXAM: THORACIC SPINE 2 VIEWS COMPARISON:  CT chest 04/25/2015 FINDINGS: Severe compression fractures noted at T6 and T12 with mild compression fractures at T10 and T11, stable. Kyphosis. Diffuse osteopenia. No visible acute bony abnormality. IMPRESSION: Multiple chronic compression fractures as above.  No acute findings. Electronically Signed   By: Charlett Nose M.D.   On: 07/22/2015 13:32   Dg Lumbar Spine Complete  07/18/2015  CLINICAL DATA:  Low back pain, pelvic pain for 3 days EXAM: LUMBAR SPINE - COMPLETE 4+ VIEW COMPARISON:  CT scan 04/25/2015 FINDINGS: Five views of lumbar spine submitted. There is diffuse osteopenia. IVC filter in place again noted. Atherosclerotic calcifications of abdominal aorta. Again noted significant compression deformity T12 vertebral body. There is interval mild compression deformity upper endplate of T11 and T10 vertebral bodies of  indeterminate age. Clinical correlation is necessary. Mild lumbar levoscoliosis. Partially visualized right hip prosthesis. IMPRESSION: Diffuse osteopenia. No lumbar spine acute fracture or subluxation. Again noted significant compression fracture of T12 vertebral body. There is interval mild compression deformity upper endplate of T11 and T10 vertebral bodies of indeterminate age. Clinical correlation is necessary. Electronically Signed   By: Natasha Mead M.D.   On: 07/18/2015 20:13   Dg Abd 2 Views  07/22/2015  CLINICAL DATA:  Abdominal pain, low back pain EXAM: ABDOMEN -  2 VIEW COMPARISON:  07/18/2015 FINDINGS: There is normal small bowel gas pattern. IVC filter in place. Abundant stool noted in right colon. Moderate stool and gas noted in sigmoid colon and rectum. Some colonic gas noted in left colon. No free abdominal air. Right hip prosthesis. Partially visualized metallic fixation material left proximal femur. Again noted significant compression deformity of T12 vertebral body. IMPRESSION: Normal small bowel gas pattern. Abundant stool noted in right colon. Moderate colonic gas and stool in sigmoid colon and rectum. No free abdominal air. Electronically Signed   By: Natasha MeadLiviu  Pop M.D.   On: 07/22/2015 13:10   Dg Hip Unilat With Pelvis 2-3 Views Left  07/18/2015  CLINICAL DATA:  Pain low back and pelvis for 3-4 days EXAM: DG HIP (WITH OR WITHOUT PELVIS) 2-3V LEFT COMPARISON:  06/01/2015 FINDINGS: Intra medullary nail left femur. No change total right hip replacement. Stable sacroiliitis and pubic symphysitis. No evidence of pelvic bone fracture. On the left, there is improved positioning and alignment at the site of intertrochanteric left femur fracture as compared to 06/01/2015. Fracture line remains visible. IMPRESSION: No acute findings. Electronically Signed   By: Esperanza Heiraymond  Rubner M.D.   On: 07/18/2015 20:15   Dg Femur Min 2 Views Left  07/22/2015  CLINICAL DATA:  Left leg pain, pelvis pain. Recent  surgery 06/02/2015. EXAM: LEFT FEMUR 2 VIEWS COMPARISON:  06/02/2015 FINDINGS: Left hip screw and femoral intra medullary nail in place. No hardware complicating feature. No acute bony abnormality. Healing left intertrochanteric fracture. IMPRESSION: Healing left intertrochanteric fracture with postoperative changes. No acute bony abnormality. Electronically Signed   By: Charlett NoseKevin  Dover M.D.   On: 07/22/2015 13:13    ASSESSMENT/PLAN:  Protein-calorie malnutrition, severe - albumin 2.88; start Procel 2 scoops by mouth twice a day; RD consult  Hyponatremia - sodium 1:30;  BMP on 08/07/15     Kenard GowerMonina Medina-Vargas, NP [facility] [phone]

## 2015-08-06 ENCOUNTER — Other Ambulatory Visit: Payer: Self-pay | Admitting: Adult Health

## 2015-08-07 LAB — BASIC METABOLIC PANEL
BUN: 12 mg/dL (ref 4–21)
Creatinine: 0.6 mg/dL (ref 0.5–1.1)
Glucose: 94 mg/dL
Potassium: 5 mmol/L (ref 3.4–5.3)
SODIUM: 129 mmol/L — AB (ref 137–147)

## 2015-08-08 ENCOUNTER — Encounter: Payer: Self-pay | Admitting: Internal Medicine

## 2015-08-08 ENCOUNTER — Non-Acute Institutional Stay (SKILLED_NURSING_FACILITY): Payer: Commercial Managed Care - HMO | Admitting: Internal Medicine

## 2015-08-08 DIAGNOSIS — E871 Hypo-osmolality and hyponatremia: Secondary | ICD-10-CM

## 2015-08-08 DIAGNOSIS — D62 Acute posthemorrhagic anemia: Secondary | ICD-10-CM | POA: Diagnosis not present

## 2015-08-08 DIAGNOSIS — R195 Other fecal abnormalities: Secondary | ICD-10-CM

## 2015-08-08 DIAGNOSIS — R7989 Other specified abnormal findings of blood chemistry: Secondary | ICD-10-CM | POA: Diagnosis not present

## 2015-08-08 DIAGNOSIS — R945 Abnormal results of liver function studies: Secondary | ICD-10-CM

## 2015-08-08 NOTE — Progress Notes (Signed)
LOCATION: [facility]  PCP: Donato Schultz, DO   Code Status: DNR  Goals of care: Advanced Directive information Advanced Directives 08/01/2015  Does patient have an advance directive? Yes  Type of Advance Directive Out of facility DNR (pink MOST or yellow form)  Does patient want to make changes to advanced directive? No - Patient declined  Copy of advanced directive(s) in chart? Yes       Extended Emergency Contact Information Primary Emergency Contact: Lieder,Steven Address: [address]           [place], [place] [address] [place] Home Phone: [phone] Mobile Phone: [phone] Relation: Son Secondary Emergency Contact: Edwyna Perfect Address: [address]          [place], [place] [address] [place] Home Phone: [phone] Work Phone: [phone] Mobile Phone: [phone] Relation: Daughter   No Known Allergies  Chief Complaint  Patient presents with  . Acute Visit    Low Sodium     HPI:  Patient is a 82 y.o. female seen today for acute visit. She had 3 loose stools last night and feels nauseous this am. She refused to take her breakfast today. Her lab work shows low sodium level. She has been working with therapy team and was walking today with help of walker to a short distance. She mentions that her pain is under control with current pain medication. She denies any further loose stool at present. She is here for short term rehabilitation post ED visit on 07/22/15-07/23/15 with inability to perform her ADLs at home with limited mobility, severe compression fracture at T6 and T12 and mild compression fracture at T10 and T11. She has PMH of CAD, HTN, Aortic stenosis, afib, dementia among others.   Review of Systems:  Constitutional: Negative for fever. Energy level is poor. HENT: Negative for headache, congestion, nasal discharge.  Eyes: Negative for blurred vision  Respiratory: Negative for cough and wheezing.  Positive for shortness of breath with exertion.  Cardiovascular: Negative for chest pain, palpitations, leg swelling.  Gastrointestinal: Negative for heartburn, vomiting, abdominal pain. Had received laxatives because of constipation until last night's bowel movement Genitourinary: Negative for dysuria.  Musculoskeletal: Negative for fall in the facility. Positive for lower back pain.  Skin: Negative for itching, rash.  Neurological: Positive for dizziness with change of position   Past Medical History  Diagnosis Date  . Diverticulitis   . Hypertensive heart disease   . Osteoporosis   . CAD (coronary artery disease)     a. 08/2012 Abnl MV;  b. 08/2012 CABGx5: LIMA->LAD, VG->Diag, VG->OM1->OM2, VG->RCA.  Marland Kitchen Dementia   . PAF (paroxysmal atrial fibrillation) (HCC)     a. Rhythm mgmt w/ amio;  b. CHA2DS2VASc = 5-->No anticoagulation 2/2 dementia/falls.  Marland Kitchen HLD (hyperlipidemia)   . GERD (gastroesophageal reflux disease)   . Dyslipidemia   . Constipation   . Anorexia   . UTI (lower urinary tract infection)   . Generalized weakness   . Compression fracture of T12 vertebra (HCC)   . Hyponatremia   . Pressure ulcer   . Depression   . Severe muscle deconditioning   . Right knee pain   . Allergic rhinitis   . Protein calorie malnutrition (HCC)   . Cerumen impaction   . Insomnia   . Hearing loss of aging   . Mild aortic stenosis     a. 08/2012 Echo: EF 60-65%, no rwma, mild AS, mildly dil LA, PASP .  Marland Kitchen  Unsteady gait   . Physical deconditioning   . Closed intertrochanteric fracture of left femur with routine healing   . Acute blood loss anemia   . Urinary retention   . 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 HLD (hyperlipidemia)   . Anxiety     Medications:   Medication List       This list is accurate as of: 08/08/15 10:58 AM.  Always use your most recent med list.               acetaminophen 325 MG tablet  Commonly known as:   TYLENOL  Take 2 tablets (650 mg total) by mouth every 6 (six) hours as needed for mild pain or headache.     alendronate 70 MG tablet  Commonly known as:  FOSAMAX  Take 70 mg by mouth once a week. Take with a full glass of water on an empty stomach on Tuesdays     amiodarone 200 MG tablet  Commonly known as:  PACERONE  TAKE 1 TABLET BY MOUTH DAILY.     aspirin EC 325 MG tablet  Take 325 mg by mouth every evening.     atorvastatin 40 MG tablet  Commonly known as:  LIPITOR  TAKE 1 TABLET BY MOUTH DAILY AT 6 PM     CERTAVITE SENIOR/ANTIOXIDANT Tabs  Take 0.5 tablets by mouth 2 (two) times daily.     diazepam 2 MG tablet  Commonly known as:  VALIUM  Take 0.5 tablets (1 mg total) by mouth every 12 (twelve) hours as needed for anxiety.     ferrous sulfate 325 (65 FE) MG tablet  Take 1 tablet (325 mg total) by mouth 2 (two) times daily with a meal.     folic acid 1 MG tablet  Commonly known as:  FOLVITE  TAKE 1 TABLET BY MOUTH EVERY DAY     HYDROcodone-acetaminophen 5-325 MG tablet  Commonly known as:  NORCO/VICODIN  Take 1-2 tablets by mouth every 6 (six) hours as needed for moderate pain.     latanoprost 0.005 % ophthalmic solution  Commonly known as:  XALATAN  Place 1 drop into both eyes at bedtime.     memantine 10 MG tablet  Commonly known as:  NAMENDA  TAKE 1 TABLET (10 MG TOTAL) BY MOUTH 2 (TWO) TIMES DAILY.     metoprolol tartrate 25 MG tablet  Commonly known as:  LOPRESSOR  Take 25 mg by mouth 2 (two) times daily. Hold for SBP <110 and HR <60     PROCEL Powd  Take 1 scoop by mouth 2 (two) times daily.     RA CALCIUM 600/VITAMIN D-3 600-400 MG-UNIT Tabs  Generic drug:  Calcium Carbonate-Vitamin D3  Take 1 tablet by mouth 2 (two) times daily.     sennosides-docusate sodium 8.6-50 MG tablet  Commonly known as:  SENOKOT-S  Take 2 tablets by mouth at bedtime.     simethicone 125 MG chewable tablet  Commonly known as:  MYLICON  Chew 125 mg by mouth every 8  (eight) hours as needed.     tamsulosin 0.4 MG Caps capsule  Commonly known as:  FLOMAX  Take 1 capsule (0.4 mg total) by mouth daily.     UNABLE TO FIND  Med Name: Med pass 90 mL by mouth 2 times daily        Immunizations: Immunization History  Administered Date(s) Administered  . Influenza,inj,Quad PF,36+ Mos 02/05/2014, 04/16/2015  . Influenza-Unspecified 04/14/2011  .  PPD Test 04/30/2015, 05/14/2015, 06/06/2015  . Pneumococcal Conjugate-13 02/05/2014  . Pneumococcal-Unspecified 04/14/2011  . Tdap 04/17/2015     Physical Exam: Filed Vitals:   08/08/15 1051  BP: 162/78  Pulse: 76  Temp: 98.7 F (37.1 C)  TempSrc: Oral  Resp: 16  Height:  (1.651 m)  Weight: 102 lb (46.267 kg)  SpO2: 99%   Body mass index is 16.97 kg/(m^2).  General- elderly female, frail and thin built, in no acute distress Head- normocephalic, atraumatic Throat- moist mucus membrane Neck- no cervical lymphadenopathy Cardiovascular- normal s1,s2, + murmur Respiratory- bilateral clear to auscultation, no wheeze, no rhonchi, no crackles, no use of accessory muscles Abdomen- bowel sounds present, soft, non tender, no guarding or rigidity Musculoskeletal- able to move all 4 extremities, limited ROM to left leg Neurological- alert and oriented Skin- warm and dry, bruise to both arms   Labs reviewed: Basic Metabolic Panel:  Recent Labs  16/10/96 1138  07/18/15 1407 07/22/15 1210  07/23/15 0932 07/31/15 08/07/15  NA 136  < > 136 131*  < > 135 130* 129*  K 4.0  < > 5.8* 5.0  < > 4.5 5.0 5.0  CL 99  < > 103 99*  --  101  --   --   CO2 31  < > 28 22  --  24  --   --   GLUCOSE 75  < > 123* 88  --  111*  --   --   BUN 15  < > 16 10  < > CREATININE 0.66  < > 0.70 0.76  < > 0.82 0.8 0.6  CALCIUM 9.5  < > 9.2 9.0  --  9.0  --   --   MG 1.9  --   --   --   --   --   --   --   < > = values in this interval not displayed. Liver Function Tests:  Recent Labs  06/01/15 2226  07/18/15 1407 07/22/15 1210 07/31/15  AST 52* 40* 33 57*  ALT 38 29 26 52*  ALKPHOS 98 152* 134* 193*  BILITOT 0.6 0.6 0.4  --   PROT 6.5 6.7 6.1*  --   ALBUMIN 3.2* 3.3* 2.9*  --     Recent Labs  01/12/15 0750 01/16/15 0245 04/25/15 1430  LIPASE 38 35 53*   No results for input(s): AMMONIA in the last 8760 hours. CBC:  Recent Labs  06/06/15 0520  07/18/15 1407 07/22/15 07/22/15 1210 07/31/15  WBC 7.4  < > 8.3 6.5 6.5 5.6  NEUTROABS  --   < > 7.3  --  5.5 4  HGB 8.9*  < > 12.4  --  11.9* 10.5*  HCT 27.8*  < > 37.7  --  36.4 33*  MCV 97.2  --  100.8*  --  102.0*  --   PLT 194  < > 264.0  --  220 245  < > = values in this interval not displayed. Cardiac Enzymes:  Recent Labs  01/16/15 0947 01/16/15 1540 04/25/15 1430  TROPONINI <0.03 <0.03 <0.03   BNP: Invalid input(s): POCBNP CBG:  Recent Labs  04/25/15 1451  GLUCAP 140*    Radiological Exams: Dg Thoracic Spine 2 View  07/22/2015  CLINICAL DATA:  Recent hip surgery. Discharge from rehab 1 week ago. Increasing pelvis and lower back pain. EXAM: THORACIC SPINE 2 VIEWS COMPARISON:  CT chest 04/25/2015 FINDINGS: Severe compression fractures noted at T6  and T12 with mild compression fractures at T10 and T11, stable. Kyphosis. Diffuse osteopenia. No visible acute bony abnormality. IMPRESSION: Multiple chronic compression fractures as above.  No acute findings. Electronically Signed   By: Charlett Nose M.D.   On: 07/22/2015 13:32   Dg Lumbar Spine Complete  07/18/2015  CLINICAL DATA:  Low back pain, pelvic pain for 3 days EXAM: LUMBAR SPINE - COMPLETE 4+ VIEW COMPARISON:  CT scan 04/25/2015 FINDINGS: Five views of lumbar spine submitted. There is diffuse osteopenia. IVC filter in place again noted. Atherosclerotic calcifications of abdominal aorta. Again noted significant compression deformity T12 vertebral body. There is interval mild compression deformity upper endplate of T11 and T10 vertebral bodies of indeterminate  age. Clinical correlation is necessary. Mild lumbar levoscoliosis. Partially visualized right hip prosthesis. IMPRESSION: Diffuse osteopenia. No lumbar spine acute fracture or subluxation. Again noted significant compression fracture of T12 vertebral body. There is interval mild compression deformity upper endplate of T11 and T10 vertebral bodies of indeterminate age. Clinical correlation is necessary. Electronically Signed   By: Natasha Mead M.D.   On: 07/18/2015 20:13   Dg Abd 2 Views  07/22/2015  CLINICAL DATA:  Abdominal pain, low back pain EXAM: ABDOMEN - 2 VIEW COMPARISON:  07/18/2015 FINDINGS: There is normal small bowel gas pattern. IVC filter in place. Abundant stool noted in right colon. Moderate stool and gas noted in sigmoid colon and rectum. Some colonic gas noted in left colon. No free abdominal air. Right hip prosthesis. Partially visualized metallic fixation material left proximal femur. Again noted significant compression deformity of T12 vertebral body. IMPRESSION: Normal small bowel gas pattern. Abundant stool noted in right colon. Moderate colonic gas and stool in sigmoid colon and rectum. No free abdominal air. Electronically Signed   By: Natasha Mead M.D.   On: 07/22/2015 13:10   Dg Hip Unilat With Pelvis 2-3 Views Left  07/18/2015  CLINICAL DATA:  Pain low back and pelvis for 3-4 days EXAM: DG HIP (WITH OR WITHOUT PELVIS) 2-3V LEFT COMPARISON:  06/01/2015 FINDINGS: Intra medullary nail left femur. No change total right hip replacement. Stable sacroiliitis and pubic symphysitis. No evidence of pelvic bone fracture. On the left, there is improved positioning and alignment at the site of intertrochanteric left femur fracture as compared to 06/01/2015. Fracture line remains visible. IMPRESSION: No acute findings. Electronically Signed   By: Esperanza Heir M.D.   On: 07/18/2015 20:15   Dg Femur Min 2 Views Left  07/22/2015  CLINICAL DATA:  Left leg pain, pelvis pain. Recent surgery 06/02/2015.  EXAM: LEFT FEMUR 2 VIEWS COMPARISON:  06/02/2015 FINDINGS: Left hip screw and femoral intra medullary nail in place. No hardware complicating feature. No acute bony abnormality. Healing left intertrochanteric fracture. IMPRESSION: Healing left intertrochanteric fracture with postoperative changes. No acute bony abnormality. Electronically Signed   By: Charlett Nose M.D.   On: 07/22/2015 13:13    Assessment/Plan  Loose stool 3 episodes last night. Abdominal exam is benign. Concern for her laxative and stool softner contributing to this. She was on bactrim for UTI and completed course on 07/31/15. Add probiotic bid for 1 week. Discontinue senokot s and miralax. Start senokot s 1 tab qhs for now as needed and monitor. If loose stool persists, consider infectious workup  Hyponatremia Poor po intake. Encouraged po intake and hydration. Not on any diuretics. Start salt tablet 1 g daily and monitor. Check bmp in 1 week  Deranged LFT Mildly elevated. Being on statin, norco and tylenol  could be contributing to this. Denies any clinical symptom. Monitor clinically. Recheck lft next lab. If remains elevated, discontinue tylenol and statin.   Blood loss anemia Post op, monitor cbc and with further drop in her Hb change feso4 to 325 mg tid   Labs/tests ordered: bmp, cbc and lft in 1 week   Family/ staff Communication: reviewed care plan with patient and nursing supervisor    Oneal GroutMAHIMA Lucas Exline, MD Internal Medicine [facility] [address] [place], [place] [address] Cell Phone (Monday-Friday 8 am - 5 pm): [phone] On Call: [phone] and follow prompts after 5 pm and on weekends Office Phone: [phone] Office Fax: [phone]

## 2015-08-12 ENCOUNTER — Other Ambulatory Visit: Payer: Self-pay | Admitting: General Practice

## 2015-08-12 MED ORDER — ALENDRONATE SODIUM 70 MG PO TABS: 70.0000 mg | ORAL_TABLET | ORAL | Status: AC

## 2015-08-12 NOTE — Telephone Encounter (Signed)
We received this fax in today, pt has established with Laury AxonLowne

## 2015-08-16 ENCOUNTER — Encounter: Payer: Self-pay | Admitting: Adult Health

## 2015-08-16 ENCOUNTER — Non-Acute Institutional Stay: Payer: Commercial Managed Care - HMO | Admitting: Adult Health

## 2015-08-16 DIAGNOSIS — D62 Acute posthemorrhagic anemia: Secondary | ICD-10-CM

## 2015-08-16 DIAGNOSIS — I480 Paroxysmal atrial fibrillation: Secondary | ICD-10-CM

## 2015-08-16 DIAGNOSIS — E785 Hyperlipidemia, unspecified: Secondary | ICD-10-CM | POA: Diagnosis not present

## 2015-08-16 DIAGNOSIS — R5381 Other malaise: Secondary | ICD-10-CM

## 2015-08-16 DIAGNOSIS — S72142S Displaced intertrochanteric fracture of left femur, sequela: Secondary | ICD-10-CM | POA: Diagnosis not present

## 2015-08-16 DIAGNOSIS — M810 Age-related osteoporosis without current pathological fracture: Secondary | ICD-10-CM | POA: Diagnosis not present

## 2015-08-16 DIAGNOSIS — S22000S Wedge compression fracture of unspecified thoracic vertebra, sequela: Secondary | ICD-10-CM | POA: Diagnosis not present

## 2015-08-16 DIAGNOSIS — K5900 Constipation, unspecified: Secondary | ICD-10-CM

## 2015-08-16 DIAGNOSIS — F0390 Unspecified dementia without behavioral disturbance: Secondary | ICD-10-CM | POA: Diagnosis not present

## 2015-08-16 DIAGNOSIS — I10 Essential (primary) hypertension: Secondary | ICD-10-CM | POA: Diagnosis not present

## 2015-08-16 DIAGNOSIS — E43 Unspecified severe protein-calorie malnutrition: Secondary | ICD-10-CM | POA: Diagnosis not present

## 2015-08-16 DIAGNOSIS — E871 Hypo-osmolality and hyponatremia: Secondary | ICD-10-CM

## 2015-08-16 NOTE — Progress Notes (Signed)
Patient ID: Jacqueline LimesJacqueline A Skoog, female   DOB: [birth year], 82 y.o.   MRN: [id number]    DATE:  08/16/2015   MRN:  [id number]  BIRTHDAY: [birth year]  Facility:  Nursing Home Location:  [facility]  Nursing Home Room Number: 102-P  LEVEL OF CARE:  SNF (31)  Contact Information    Name Relation Home Work [facility]   Stahle,Steven Son [phone]  [phone]   Edwyna Perfecturnbull,[name] Daughter [phone]       Code Status History    Date Active Date Inactive Code Status Order ID Comments User Context   06/02/2015  3:58 AM 06/06/2015  8:12 PM DNR [id number]  Alberteen Samhristopher P Danford, MD ED   04/25/2015  9:56 PM 04/30/2015  9:46 PM DNR [id number]  Hillary BowJared M Gardner, DO Inpatient   04/25/2015  9:50 PM 04/25/2015  9:56 PM Full Code [id number]  Hillary BowJared M Gardner, DO Inpatient   01/16/2015 12:46 AM 01/18/2015  4:33 PM DNR [id number]  Lorretta HarpXilin Niu, MD ED   10/20/2014  1:44 PM 10/23/2014  7:22 PM DNR [id number]  Leatha Gildingostin M Gherghe, MD ED   08/29/2012 11:50 AM 09/02/2012  4:24 PM Full Code [id number]  Alleen BorneBryan K Bartle, MD Inpatient   08/25/2012  1:43 PM 08/29/2012 11:50 AM Full Code [id number]  Rowe ClackWayne E Gold, PA-C Inpatient    Questions for Most Recent Historical Code Status (Order [id number])    Question Answer Comment   In the event of cardiac or respiratory ARREST Do not call a "code blue"    In the event of cardiac or respiratory ARREST Do not perform Intubation, CPR, defibrillation or ACLS    In the event of cardiac or respiratory ARREST Use medication by any route, position, wound care, and other measures to relive pain and suffering. May use oxygen, suction and manual treatment of airway obstruction as needed for comfort.     Advance Directive Documentation        Most Recent Value   Type of Advance Directive  Out of facility DNR (pink MOST or yellow form)   Pre-existing out of facility DNR order (yellow form or pink MOST form)     "MOST" Form in Place?         Chief Complaint   Patient presents with  . Discharge Note    HISTORY OF PRESENT ILLNESS:  This is an 82 year old female who is for discharge home with home health PT, OT, CNA, SW and Nursing.  [facility]. She has PMH of CAD, hypertension,  aortic stenosis, dementia and atrial fibrillation. She went to ED recently due to  inability to perform her ADLs at home with limited mobility. She had imaging showing severe compression fracture at T6 and T12 and mild compression fracture at T10 and T11. No dislocation noted to left hip/ femur area. She was here recently for short term rehabilitation post hospital admission from 05/27/15-06/06/15 with left femur closed fracture post fall. She underwent ORIF on 06/02/15. She was discharged from the facility recently with home health services.  Patient was admitted to this facility for short-term rehabilitation after the patient's recent hospitalization.  Patient has completed SNF rehabilitation and therapy has cleared the patient for discharge.   PAST MEDICAL HISTORY:  Past Medical History  Diagnosis Date  . Diverticulitis   . Hypertensive heart disease   . Osteoporosis   . CAD (coronary artery disease)     a. 08/2012 Abnl MV;  b. 08/2012 CABGx5: LIMA->LAD, VG->Diag, VG->OM1->OM2, VG->RCA.  Marland Kitchen Dementia   . PAF (paroxysmal atrial fibrillation) (HCC)     a. Rhythm mgmt w/ amio;  b. CHA2DS2VASc = 5-->No anticoagulation 2/2 dementia/falls.  Marland Kitchen HLD (hyperlipidemia)   . GERD (gastroesophageal reflux disease)   . Dyslipidemia   . Constipation   . Anorexia   . UTI (lower urinary tract infection)   . Generalized weakness   . Compression fracture of T12 vertebra (HCC)   . Hyponatremia   . Pressure ulcer   . Depression   . Severe muscle deconditioning   . Right knee pain   . Allergic rhinitis   . Protein calorie malnutrition (HCC)   . Cerumen impaction   . Insomnia   . Hearing loss of aging   . Mild aortic  stenosis     a. 08/2012 Echo: EF 60-65%, no rwma, mild AS, mildly dil LA, PASP .  Marland Kitchen Unsteady gait   . Physical deconditioning   . Closed intertrochanteric fracture of left femur with routine healing   . Acute blood loss anemia   . Urinary retention   . 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 HLD (hyperlipidemia)   . Anxiety      CURRENT MEDICATIONS: Reviewed  Patient's Medications  New Prescriptions   No medications on file  Previous Medications   ACETAMINOPHEN (TYLENOL) 325 MG TABLET    Take 2 tablets (650 mg total) by mouth every 6 (six) hours as needed for mild pain or headache.   ALENDRONATE (FOSAMAX) 70 MG TABLET    Take 1 tablet (70 mg total) by mouth once a week. Take with a full glass of water on an empty stomach on Tuesdays   AMIODARONE (PACERONE) 200 MG TABLET    TAKE 1 TABLET BY MOUTH DAILY.   ASPIRIN EC 325 MG TABLET    Take 325 mg by mouth every evening.    ATORVASTATIN (LIPITOR) 40 MG TABLET    TAKE 1 TABLET BY MOUTH DAILY AT 6 PM   CALCIUM CARBONATE-VITAMIN D3 (RA CALCIUM 600/VITAMIN D-3) 600-400 MG-UNIT TABS    Take 1 tablet by mouth 2 (two) times daily.   DIAZEPAM (VALIUM) 2 MG TABLET    Take 0.5 tablets (1 mg total) by mouth every 12 (twelve) hours as needed for anxiety.   FERROUS SULFATE 325 (65 FE) MG TABLET    Take 325 mg by mouth 3 (three) times daily with meals.   FOLIC ACID (FOLVITE) 1 MG TABLET    TAKE 1 TABLET BY MOUTH EVERY DAY   HYDROCODONE-ACETAMINOPHEN (NORCO/VICODIN) 5-325 MG TABLET    Take 1-2 tablets by mouth every 6 (six) hours as needed for moderate pain.   LATANOPROST (XALATAN) 0.005 % OPHTHALMIC SOLUTION    Place 1 drop into both eyes at bedtime.   MEMANTINE (NAMENDA) 10 MG TABLET    TAKE 1 TABLET (10 MG TOTAL) BY MOUTH 2 (TWO) TIMES DAILY.   METOPROLOL TARTRATE (LOPRESSOR) 25 MG TABLET    Take 25 mg by mouth 2 (two) times daily. Hold for SBP <110 and HR <60   MULTIPLE VITAMINS-MINERALS (CERTAVITE  SENIOR/ANTIOXIDANT) TABS    Take 0.5 tablets by mouth 2 (two) times daily.   PROMETHAZINE (PHENERGAN) 12.5 MG TABLET    Take 12.5 mg by mouth every 6 (six) hours as needed for nausea or vomiting. Q.6h. X 24h   PROTEIN (PROCEL) POWD    Take 1 scoop by mouth 2 (two) times daily.  SENNOSIDES-DOCUSATE SODIUM (SENOKOT-S) 8.6-50 MG TABLET    Take 1 tablet by mouth at bedtime.    SIMETHICONE (MYLICON) 125 MG CHEWABLE TABLET    Chew 125 mg by mouth every 8 (eight) hours as needed.    SODIUM CHLORIDE PO    Take 1,000 mg by mouth daily.   TAMSULOSIN (FLOMAX) 0.4 MG CAPS CAPSULE    Take 1 capsule (0.4 mg total) by mouth daily.   UNABLE TO FIND    Med Name: Med pass 90 mL by mouth 2 times daily  Modified Medications   No medications on file  Discontinued Medications   FERROUS SULFATE 325 (65 FE) MG TABLET    Take 1 tablet (325 mg total) by mouth 2 (two) times daily with a meal.     No Known Allergies   REVIEW OF SYSTEMS:  GENERAL: no change in appetite, no fatigue, no weight changes, no fever, chills or weakness EYES: Denies change in vision, dry eyes, eye pain, itching or discharge EARS: Denies change in hearing, ringing in ears, or earache NOSE: Denies nasal congestion or epistaxis MOUTH and THROAT: Denies oral discomfort, gingival pain or bleeding, pain from teeth or hoarseness   RESPIRATORY: no cough, SOB, DOE, wheezing, hemoptysis CARDIAC: no chest pain, edema or palpitations GI: no abdominal pain, diarrhea, constipation, heart burn, nausea or vomiting GU: Denies dysuria, frequency, hematuria, incontinence, or discharge PSYCHIATRIC: Denies feeling of depression or anxiety. No report of hallucinations, insomnia, paranoia, or agitation   PHYSICAL EXAMINATION  GENERAL APPEARANCE: Well nourished. In no acute distress. Normal body habitus SKIN:  Skin is warm and dry.  HEAD: Normal in size and contour. No evidence of trauma EYES: Lids open and close normally. No blepharitis, entropion or  ectropion. PERRL. Conjunctivae are clear and sclerae are white. Lenses are without opacity EARS: Pinnae are normal. Patient hears normal voice tunes of the examiner MOUTH and THROAT: Lips are without lesions. Oral mucosa is moist and without lesions. Tongue is normal in shape, size, and color and without lesions NECK: supple, trachea midline, no neck masses, no thyroid tenderness, no thyromegaly LYMPHATICS: no LAN in the neck, no supraclavicular LAN RESPIRATORY: breathing is even & unlabored, BS CTAB CARDIAC: RRR, no murmur,no extra heart sounds, no edema GI: abdomen soft, normal BS, no masses, no tenderness, no hepatomegaly, no splenomegaly EXTREMITIES:  Able to move X 4 extremities PSYCHIATRIC: Alert and oriented X 3. Affect and behavior are appropriate  LABS/RADIOLOGY: Labs reviewed: Basic Metabolic Panel:  Recent Labs  16/10/96 1138  07/18/15 1407 07/22/15 1210  07/23/15 0932 07/31/15 08/07/15  NA 136  < > 136 131*  < > 135 130* 129*  K 4.0  < > 5.8* 5.0  < > 4.5 5.0 5.0  CL 99  < > 103 99*  --  101  --   --   CO2 31  < > 28 22  --  24  --   --   GLUCOSE 75  < > 123* 88  --  111*  --   --   BUN 15  < > 16 10  < > 9 17 12   CREATININE 0.66  < > 0.70 0.76  < > 0.82 0.8 0.6  CALCIUM 9.5  < > 9.2 9.0  --  9.0  --   --   MG 1.9  --   --   --   --   --   --   --   < > = values in this  interval not displayed. Liver Function Tests:  Recent Labs  06/01/15 2226 07/18/15 1407 07/22/15 1210 07/31/15  AST 52* 40* 33 57*  ALT 38 29 26 52*  ALKPHOS 98 152* 134* 193*  BILITOT 0.6 0.6 0.4  --   PROT 6.5 6.7 6.1*  --   ALBUMIN 3.2* 3.3* 2.9*  --     Recent Labs  01/12/15 0750 01/16/15 0245 04/25/15 1430  LIPASE 38 35 53*   CBC:  Recent Labs  06/06/15 0520  07/18/15 1407 07/22/15 07/22/15 1210 07/31/15  WBC 7.4  < > 8.3 6.5 6.5 5.6  NEUTROABS  --   < > 7.3  --  5.5 4  HGB 8.9*  < > 12.4  --  11.9* 10.5*  HCT 27.8*  < > 37.7  --  36.4 33*  MCV 97.2  --  100.8*  --   102.0*  --   PLT 194  < > 264.0  --  220 245  < > = values in this interval not displayed.  Lipid Panel:  Recent Labs  01/22/15 1138 05/31/15 1055  HDL 49.30 42.70   Cardiac Enzymes:  Recent Labs  01/16/15 0947 01/16/15 1540 04/25/15 1430  TROPONINI <0.03 <0.03 <0.03   CBG:  Recent Labs  04/25/15 1451  GLUCAP 140*      Dg Thoracic Spine 2 View  07/22/2015  CLINICAL DATA:  Recent hip surgery. Discharge from rehab 1 week ago. Increasing pelvis and lower back pain. EXAM: THORACIC SPINE 2 VIEWS COMPARISON:  CT chest 04/25/2015 FINDINGS: Severe compression fractures noted at T6 and T12 with mild compression fractures at T10 and T11, stable. Kyphosis. Diffuse osteopenia. No visible acute bony abnormality. IMPRESSION: Multiple chronic compression fractures as above.  No acute findings. Electronically Signed   By: Charlett Nose M.D.   On: 07/22/2015 13:32   Dg Lumbar Spine Complete  07/18/2015  CLINICAL DATA:  Low back pain, pelvic pain for 3 days EXAM: LUMBAR SPINE - COMPLETE 4+ VIEW COMPARISON:  CT scan 04/25/2015 FINDINGS: Five views of lumbar spine submitted. There is diffuse osteopenia. IVC filter in place again noted. Atherosclerotic calcifications of abdominal aorta. Again noted significant compression deformity T12 vertebral body. There is interval mild compression deformity upper endplate of T11 and T10 vertebral bodies of indeterminate age. Clinical correlation is necessary. Mild lumbar levoscoliosis. Partially visualized right hip prosthesis. IMPRESSION: Diffuse osteopenia. No lumbar spine acute fracture or subluxation. Again noted significant compression fracture of T12 vertebral body. There is interval mild compression deformity upper endplate of T11 and T10 vertebral bodies of indeterminate age. Clinical correlation is necessary. Electronically Signed   By: Natasha Mead M.D.   On: 07/18/2015 20:13   Dg Abd 2 Views  07/22/2015  CLINICAL DATA:  Abdominal pain, low back pain  EXAM: ABDOMEN - 2 VIEW COMPARISON:  07/18/2015 FINDINGS: There is normal small bowel gas pattern. IVC filter in place. Abundant stool noted in right colon. Moderate stool and gas noted in sigmoid colon and rectum. Some colonic gas noted in left colon. No free abdominal air. Right hip prosthesis. Partially visualized metallic fixation material left proximal femur. Again noted significant compression deformity of T12 vertebral body. IMPRESSION: Normal small bowel gas pattern. Abundant stool noted in right colon. Moderate colonic gas and stool in sigmoid colon and rectum. No free abdominal air. Electronically Signed   By: Natasha Mead M.D.   On: 07/22/2015 13:10   Dg Hip Unilat With Pelvis 2-3 Views Left  07/18/2015  CLINICAL DATA:  Pain low back and pelvis for 3-4 days EXAM: DG HIP (WITH OR WITHOUT PELVIS) 2-3V LEFT COMPARISON:  06/01/2015 FINDINGS: Intra medullary nail left femur. No change total right hip replacement. Stable sacroiliitis and pubic symphysitis. No evidence of pelvic bone fracture. On the left, there is improved positioning and alignment at the site of intertrochanteric left femur fracture as compared to 06/01/2015. Fracture line remains visible. IMPRESSION: No acute findings. Electronically Signed   By: Esperanza Heir M.D.   On: 07/18/2015 20:15   Dg Femur Min 2 Views Left  07/22/2015  CLINICAL DATA:  Left leg pain, pelvis pain. Recent surgery 06/02/2015. EXAM: LEFT FEMUR 2 VIEWS COMPARISON:  06/02/2015 FINDINGS: Left hip screw and femoral intra medullary nail in place. No hardware complicating feature. No acute bony abnormality. Healing left intertrochanteric fracture. IMPRESSION: Healing left intertrochanteric fracture with postoperative changes. No acute bony abnormality. Electronically Signed   By: Charlett Nose M.D.   On: 07/22/2015 13:13    ASSESSMENT/PLAN:  Physical deconditioning  -  for home health PT, OT, CNA, SWS and Nursing  Thoracic compression fracture - continue norco 5-325  mg 1-2 tab q6h prn pain and valium prn for muscle spasm.   Osteoporosis - continue weekly fosamax with oscal with vitamin d  Closed intertrochanteric fracture of left femur S/P ORIF. WBAT; continue norco 5-325 mg 1-2 tab q6h prn pain. Continue aspirin EC 325 mg daily for DVT prophylaxis  Blood loss anemia  - hgb 10.5; stable; continue ferrous sulfate bid  Constipation - continue miralax to 17 g daily and senokot s 2 tab qhs   Protein calorie malnutrition, severe - albumin 2.88; continue Procel 2 scoops by mouth twice a day  Atrial fibrillation - controlled; continue Lopressor 25 mg twice a day and aspirin 325 mg daily  Hyperlipidemia - continue atorvastatin 40 mg 1 tab by mouth daily evening  Hypertension -  continue metoprolol titrate 25 mg 1 tab by mouth twice a day  Dementia - continue Namenda 10 mg 1 tab by mouth twice a day; continue supportive care and fall precaution  Hyponatremia - sodium 129; recently started onNaCl 1 gm 1 tab daily     I have filled out patient's discharge paperwork and written prescriptions.  Patient will receive home health PT, OT, SW, Nursing and CNA.  Total discharge time: Greater than 30 minutes  Discharge time involved coordination of the discharge process with social worker, nursing staff and therapy department. Medical justification for home health services verified.    Kenard Gower, NP [employer] [phone]

## 2015-08-27 ENCOUNTER — Other Ambulatory Visit: Payer: Self-pay | Admitting: *Deleted

## 2015-08-27 ENCOUNTER — Other Ambulatory Visit: Payer: Self-pay | Admitting: Adult Health

## 2015-08-27 NOTE — Telephone Encounter (Signed)
Call[facility] and [facility] @ 10:03am @ [phone] asking the pt's son Jacqueline [name](Steven) to RTC regarding medication refill and med change.//AB/CMA

## 2015-09-02 DIAGNOSIS — S72142D Displaced intertrochanteric fracture of left femur, subsequent encounter for closed fracture with routine healing: Secondary | ICD-10-CM | POA: Diagnosis not present

## 2015-09-02 DIAGNOSIS — S22050D Wedge compression fracture of T5-T6 vertebra, subsequent encounter for fracture with routine healing: Secondary | ICD-10-CM | POA: Diagnosis not present

## 2015-09-04 NOTE — Telephone Encounter (Signed)
Never heard back from the pt's son regarding medication refill request.//AB/CMA

## 2015-09-05 ENCOUNTER — Other Ambulatory Visit: Payer: Self-pay | Admitting: Emergency Medicine

## 2015-09-05 MED ORDER — ATORVASTATIN CALCIUM 40 MG PO TABS: ORAL_TABLET | ORAL | Status: AC

## 2015-09-12 ENCOUNTER — Telehealth: Payer: Self-pay | Admitting: Family Medicine

## 2015-09-12 DIAGNOSIS — S72142A Displaced intertrochanteric fracture of left femur, initial encounter for closed fracture: Secondary | ICD-10-CM

## 2015-09-12 DIAGNOSIS — S22000A Wedge compression fracture of unspecified thoracic vertebra, initial encounter for closed fracture: Secondary | ICD-10-CM

## 2015-09-12 DIAGNOSIS — M810 Age-related osteoporosis without current pathological fracture: Secondary | ICD-10-CM

## 2015-09-12 NOTE — Telephone Encounter (Signed)
Caller name: Tresa EndoKelly PT with [facility] Can be reached: [phone]   Reason for call: [facility] but feels like pt can benefit from outpt therapy. Can you send referral for [facility]pt physical [facility] (Fax# [phone])

## 2015-09-13 NOTE — Telephone Encounter (Signed)
Ok to send referral for out pt

## 2015-09-13 NOTE — Telephone Encounter (Signed)
Please advise      KP 

## 2015-09-16 NOTE — Telephone Encounter (Signed)
Ref has been placed.     KP 

## 2015-09-26 ENCOUNTER — Encounter: Payer: Self-pay | Admitting: Family Medicine

## 2015-09-26 ENCOUNTER — Ambulatory Visit (INDEPENDENT_AMBULATORY_CARE_PROVIDER_SITE_OTHER): Payer: Commercial Managed Care - HMO | Admitting: Family Medicine

## 2015-09-26 VITALS — BP 110/68 | HR 53 | Temp 98.1°F

## 2015-09-26 DIAGNOSIS — R29898 Other symptoms and signs involving the musculoskeletal system: Secondary | ICD-10-CM

## 2015-09-26 DIAGNOSIS — E43 Unspecified severe protein-calorie malnutrition: Secondary | ICD-10-CM

## 2015-09-26 DIAGNOSIS — S22000S Wedge compression fracture of unspecified thoracic vertebra, sequela: Secondary | ICD-10-CM | POA: Diagnosis not present

## 2015-09-26 DIAGNOSIS — R350 Frequency of micturition: Secondary | ICD-10-CM

## 2015-09-26 DIAGNOSIS — R296 Repeated falls: Secondary | ICD-10-CM

## 2015-09-26 LAB — POC URINALSYSI DIPSTICK (AUTOMATED)
BILIRUBIN UA: NEGATIVE
Glucose, UA: NEGATIVE
KETONES UA: NEGATIVE
Nitrite, UA: NEGATIVE
PH UA: 6
PROTEIN UA: NEGATIVE
RBC UA: NEGATIVE
Spec Grav, UA: 1.015
Urobilinogen, UA: 2

## 2015-09-26 MED ORDER — CEPHALEXIN 500 MG PO CAPS: 500.0000 mg | ORAL_CAPSULE | Freq: Two times a day (BID) | ORAL | Status: DC

## 2015-09-26 NOTE — Progress Notes (Signed)
Pre visit review using our clinic review tool, if applicable. No additional management support is needed unless otherwise documented below in the visit note. 

## 2015-09-26 NOTE — Patient Instructions (Signed)

## 2015-09-26 NOTE — Progress Notes (Signed)
Patient ID: Jacqueline [name], female    DOB: 06-21-1928  Age: 82 y.o. MRN: [id number]    Subjective:  Subjective HPI VERMELLE CAMMARATA presents for evaluation of worsening weakness and inc urination.  She is getting them up at night every 45 min or so.  They are requesting some help.     Review of Systems  Constitutional: Negative for diaphoresis, appetite change, fatigue and unexpected weight change.  Eyes: Positive for visual disturbance. Negative for pain and redness.  Respiratory: Negative for cough, chest tightness, shortness of breath and wheezing.   Cardiovascular: Negative for chest pain, palpitations and leg swelling.  Endocrine: Negative for cold intolerance, heat intolerance, polydipsia, polyphagia and polyuria.  Genitourinary: Positive for urgency and frequency. Negative for dysuria and difficulty urinating.  Neurological: Positive for weakness. Negative for dizziness, light-headedness, numbness and headaches.    History Past Medical History  Diagnosis Date  . Diverticulitis   . Hypertensive heart disease   . Osteoporosis   . CAD (coronary artery disease)     a. 08/2012 Abnl MV;  b. 08/2012 CABGx5: LIMA->LAD, VG->Diag, VG->OM1->OM2, VG->RCA.  Marland Kitchen Dementia   . PAF (paroxysmal atrial fibrillation) (HCC)     a. Rhythm mgmt w/ amio;  b. CHA2DS2VASc = 5-->No anticoagulation 2/2 dementia/falls.  Marland Kitchen HLD (hyperlipidemia)   . GERD (gastroesophageal reflux disease)   . Dyslipidemia   . Constipation   . Anorexia   . UTI (lower urinary tract infection)   . Generalized weakness   . Compression fracture of T12 vertebra (HCC)   . Hyponatremia   . Pressure ulcer   . Depression   . Severe muscle deconditioning   . Right knee pain   . Allergic rhinitis   . Protein calorie malnutrition (HCC)   . Cerumen impaction   . Insomnia   . Hearing loss of aging   . Mild aortic stenosis     a. 08/2012 Echo: EF 60-65%, no rwma, mild AS, mildly dil LA, PASP .  Marland Kitchen Unsteady gait     . Physical deconditioning   . Closed intertrochanteric fracture of left femur with routine healing   . Acute blood loss anemia   . Urinary retention   . 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 HLD (hyperlipidemia)   . Anxiety     She has past surgical history that includes Fracture surgery; Partial colectomy; Coronary artery bypass graft (N/A, 08/25/2012); left heart catheterization with coronary angiogram (N/A, 08/22/2012); and Intramedullary (im) nail intertrochanteric (Left, 06/02/2015).   Her family history includes Cancer in her mother; Hypertension in her other. There is no history of Diabetes, Heart disease, or Stroke.She reports that she has quit smoking. Her smoking use included Cigarettes. She has never used smokeless tobacco. She reports that she does not drink alcohol or use illicit drugs.  Current Outpatient Prescriptions on File Prior to Visit  Medication Sig Dispense Refill  . acetaminophen (TYLENOL) 325 MG tablet Take 2 tablets (650 mg total) by mouth every 6 (six) hours as needed for mild pain or headache.    . alendronate (FOSAMAX) 70 MG tablet Take 1 tablet (70 mg total) by mouth once a week. Take with a full glass of water on an empty stomach on Tuesdays 12 tablet 1  . amiodarone (PACERONE) 200 MG tablet TAKE 1 TABLET BY MOUTH DAILY. 30 tablet 3  . aspirin EC 325 MG tablet Take 325 mg by mouth every evening.     Marland Kitchen atorvastatin (LIPITOR) 40 MG  tablet TAKE 1 TABLET BY MOUTH DAILY AT 6 PM 90 tablet 1  . Calcium Carbonate-Vitamin D3 (RA CALCIUM 600/VITAMIN D-3) 600-400 MG-UNIT TABS Take 1 tablet by mouth 2 (two) times daily.    . diazepam (VALIUM) 2 MG tablet Take 0.5 tablets (1 mg total) by mouth every 12 (twelve) hours as needed for anxiety. 10 tablet 0  . ferrous sulfate 325 (65 FE) MG tablet Take 325 mg by mouth 3 (three) times daily with meals.    . folic acid (FOLVITE) 1 MG tablet TAKE 1 TABLET BY MOUTH EVERY DAY 90 tablet 1  .  latanoprost (XALATAN) 0.005 % ophthalmic solution Place 1 drop into both eyes at bedtime.    . memantine (NAMENDA) 10 MG tablet TAKE 1 TABLET (10 MG TOTAL) BY MOUTH 2 (TWO) TIMES DAILY. 60 tablet 9  . metoprolol tartrate (LOPRESSOR) 25 MG tablet Take 25 mg by mouth 2 (two) times daily. Hold for SBP <110 and HR <60    . Multiple Vitamins-Minerals (CERTAVITE SENIOR/ANTIOXIDANT) TABS Take 0.5 tablets by mouth 2 (two) times daily.    . promethazine (PHENERGAN) 12.5 MG tablet Take 12.5 mg by mouth every 6 (six) hours as needed for nausea or vomiting. Q.6h. X 24h    . simethicone (MYLICON) 125 MG chewable tablet Chew 125 mg by mouth every 8 (eight) hours as needed.     . SODIUM CHLORIDE PO Take 1,000 mg by mouth daily.     No current facility-administered medications on file prior to visit.     Objective:  Objective Physical Exam  Constitutional: She appears well-developed and well-nourished.  HENT:  Head: Normocephalic and atraumatic.  Eyes: Conjunctivae and EOM are normal.  Neck: Normal range of motion. Neck supple. No JVD present. Carotid bruit is not present. No thyromegaly present.  Cardiovascular: Normal rate, regular rhythm and normal heart sounds.   No murmur heard. Pulmonary/Chest: Effort normal and breath sounds normal. No respiratory distress. She has no wheezes. She has no rales. She exhibits no tenderness.  Musculoskeletal: She exhibits no edema.  Neurological: She is alert. She is disoriented. Coordination and gait abnormal.  Pt unable to stand on her own and was unable to walk--- she just lifted her feet up and down -- could not move forward.     BP 110/68 mmHg  Pulse 53  Temp(Src) 98.1 F (36.7 C) (Oral)  Wt   SpO2 98% Wt Readings from Last 3 Encounters:  08/16/15 102 lb (46.267 kg)  08/08/15 102 lb (46.267 kg)  08/01/15 105 lb (47.628 kg)     Lab Results  Component Value Date   WBC 5.6 07/31/2015   HGB 10.5* 07/31/2015   HCT 33* 07/31/2015   PLT 245 07/31/2015     GLUCOSE 111* 07/23/2015   CHOL 93 05/31/2015   TRIG 82.0 05/31/2015   HDL 42.70 05/31/2015   LDLCALC 34 05/31/2015   ALT 52* 07/31/2015   AST 57* 07/31/2015   NA 129* 08/07/2015   K 5.0 08/07/2015   CL 101 07/23/2015   CREATININE 0.6 08/07/2015   BUN 12 08/07/2015   CO2 24 07/23/2015   TSH 2.323 01/16/2015   INR 1.15 06/02/2015   HGBA1C 5.4 08/25/2012    Dg Thoracic Spine 2 View  07/22/2015  CLINICAL DATA:  Recent hip surgery. Discharge from rehab 1 week ago. Increasing pelvis and lower back pain. EXAM: THORACIC SPINE 2 VIEWS COMPARISON:  CT chest 04/25/2015 FINDINGS: Severe compression fractures noted at T6 and T12 with mild compression  fractures at T10 and T11, stable. Kyphosis. Diffuse osteopenia. No visible acute bony abnormality. IMPRESSION: Multiple chronic compression fractures as above.  No acute findings. Electronically Signed   By: Charlett Nose M.D.   On: 07/22/2015 13:32   Dg Abd 2 Views  07/22/2015  CLINICAL DATA:  Abdominal pain, low back pain EXAM: ABDOMEN - 2 VIEW COMPARISON:  07/18/2015 FINDINGS: There is normal small bowel gas pattern. IVC filter in place. Abundant stool noted in right colon. Moderate stool and gas noted in sigmoid colon and rectum. Some colonic gas noted in left colon. No free abdominal air. Right hip prosthesis. Partially visualized metallic fixation material left proximal femur. Again noted significant compression deformity of T12 vertebral body. IMPRESSION: Normal small bowel gas pattern. Abundant stool noted in right colon. Moderate colonic gas and stool in sigmoid colon and rectum. No free abdominal air. Electronically Signed   By: Natasha Mead M.D.   On: 07/22/2015 13:10   Dg Femur Min 2 Views Left  07/22/2015  CLINICAL DATA:  Left leg pain, pelvis pain. Recent surgery 06/02/2015. EXAM: LEFT FEMUR 2 VIEWS COMPARISON:  06/02/2015 FINDINGS: Left hip screw and femoral intra medullary nail in place. No hardware complicating feature. No acute bony  abnormality. Healing left intertrochanteric fracture. IMPRESSION: Healing left intertrochanteric fracture with postoperative changes. No acute bony abnormality. Electronically Signed   By: Charlett Nose M.D.   On: 07/22/2015 13:13     Assessment & Plan:  Plan I have discontinued Ms. Cannady's tamsulosin, HYDROcodone-acetaminophen, PROCEL, sennosides-docusate sodium, and UNABLE TO FIND. I am also having her start on cephALEXin. Additionally, I am having her maintain her aspirin EC, memantine, folic acid, acetaminophen, amiodarone, diazepam, CERTAVITE SENIOR/ANTIOXIDANT, Calcium Carbonate-Vitamin D3, latanoprost, metoprolol tartrate, simethicone, alendronate, SODIUM CHLORIDE PO, promethazine, ferrous sulfate, and atorvastatin.  Meds ordered this encounter  Medications  . cephALEXin (KEFLEX) 500 MG capsule    Sig: Take 1 capsule (500 mg total) by mouth 2 (two) times daily.    Dispense:  20 capsule    Refill:  0    Problem List Items Addressed This Visit    Falls frequently - Primary   Relevant Orders   Amb Referral to Palliative Care    Other Visit Diagnoses    Weakness of both legs        Relevant Orders    Amb Referral to Palliative Care    Severe protein-calorie malnutrition (HCC)        Relevant Orders    Amb Referral to Palliative Care    Fracture, thoracic vertebra, compression, sequela        Relevant Orders    Amb Referral to Palliative Care    Urine frequency        Relevant Medications    cephALEXin (KEFLEX) 500 MG capsule    Other Relevant Orders    POCT Urinalysis Dipstick (Automated) (Completed)    Urine Culture     Pt has done all they can do Urine sent for culture----if symptoms worsen go ahead and start abx Call with any concerns / questions  Follow-up: Return if symptoms worsen or fail to improve.  Donato Schultz, DO

## 2015-09-27 ENCOUNTER — Other Ambulatory Visit: Payer: Self-pay | Admitting: Adult Health

## 2015-09-27 ENCOUNTER — Telehealth: Payer: Self-pay | Admitting: Family Medicine

## 2015-09-27 NOTE — Telephone Encounter (Signed)
Call from [facility] and the patient has another fall last night, she fell getting off of the commode and c/o rib pain, she has a area that is sensitive to touch as well. OK per Dr.Lowne to have the mobile unit complete a CXR with a rib X-ray. Verbal given to home health.   KP

## 2015-09-29 LAB — URINE CULTURE

## 2015-09-30 ENCOUNTER — Telehealth: Payer: Self-pay | Admitting: Family Medicine

## 2015-09-30 ENCOUNTER — Encounter: Payer: Self-pay | Admitting: Family Medicine

## 2015-09-30 DIAGNOSIS — S72142A Displaced intertrochanteric fracture of left femur, initial encounter for closed fracture: Secondary | ICD-10-CM

## 2015-09-30 DIAGNOSIS — M4854XA Collapsed vertebra, not elsewhere classified, thoracic region, initial encounter for fracture: Secondary | ICD-10-CM

## 2015-09-30 NOTE — Telephone Encounter (Signed)
Caller Name: Vanwyk,[name] Relation to WU:JWJXpt:self Call back number:[phone] Pharmacy:  Reason for call:  Daughter checking on the status of hospice of palliative care. Please advise

## 2015-09-30 NOTE — Telephone Encounter (Signed)
error:[id number] ° °

## 2015-10-01 NOTE — Telephone Encounter (Signed)
Hospice referral pending.    KP

## 2015-10-04 NOTE — Telephone Encounter (Signed)
Ok for hospital bed and probiotics --- zoloft 25 mg #30  1 po qd , 2 refills  --- let us know in 1 month how she is doing and we can adjust dose

## 2015-10-04 NOTE — Telephone Encounter (Signed)
Jacqueline BectonAnita Coady, PA w/ Palliative care is requesting a order for hospital bed, probiotics and to add a low dose zoloft. Call back # is [phone]

## 2015-10-04 NOTE — Telephone Encounter (Signed)
Please advise      KP 

## 2015-10-08 MED ORDER — SERTRALINE HCL 25 MG PO TABS: 25.0000 mg | ORAL_TABLET | Freq: Every day | ORAL | Status: DC

## 2015-10-08 MED ORDER — CVS PROBIOTIC ACIDOPHILUS 10 MG PO CAPS: 1.0000 | ORAL_CAPSULE | Freq: Every day | ORAL | Status: AC

## 2015-10-08 NOTE — Telephone Encounter (Signed)
Jacqueline [name] is aware that both med's have been sent. I will send the hospital bed but she wants to be sure someone will come and set it up.    KP

## 2015-10-11 NOTE — Telephone Encounter (Signed)
Caller name: Alinda Doomsngela T. Relation to ZO:XWRUEAVWUJWpt:daughterlaw  Call back number: [phone]    Reason for call: stated [facility]. Please advise

## 2015-10-14 ENCOUNTER — Telehealth: Payer: Self-pay | Admitting: Family Medicine

## 2015-10-14 NOTE — Telephone Encounter (Signed)
[facility] requesting documentation supporting the need for a hospital bed and APP&P. Also included in fax is a progress note for the evaluation of the hospital bed that needs to be completed by Dr. Zola ButtonLowne-Chase. Form forwarded to Dr. Zola ButtonLowne-Chase. JG//CMA

## 2015-10-14 NOTE — Telephone Encounter (Signed)
Order printed and faxed to number listed below.  Fax confirmation received.  Called daughter-in-law , Marylene Landngela (who is on pt's DPR), and made her aware that order has been faxed.  Also called Henderson NewcomerMelissa Stenson at [facility] to make her aware that order has been faxed.  She said that she would call back if any additional information is needed.

## 2015-10-14 NOTE — Telephone Encounter (Signed)
Per call from Harrington ChallengerKim, Ashlee is working on bed for pt

## 2015-10-14 NOTE — Telephone Encounter (Signed)
The order is in, [facility] print [phone].  KP

## 2015-10-14 NOTE — Telephone Encounter (Signed)
Jacqueline [name] - [facility] - [phone] ext [phone]   She is calling in to speak with RN, requesting Office notes to support the need for a bed an appnp. (not sure, [facility]) for pt.Marland Kitchen..Marland Kitchen

## 2015-10-16 ENCOUNTER — Telehealth: Payer: Self-pay | Admitting: Family Medicine

## 2015-10-16 MED ORDER — AMIODARONE HCL 200 MG PO TABS: 200.0000 mg | ORAL_TABLET | Freq: Every day | ORAL | Status: AC

## 2015-10-16 NOTE — Telephone Encounter (Signed)
Caller name: Marylene Landngela Relationship to patient: Daughter Can be reached: [phone] Pharmacy:  [id number]- [place], [place] - [address] [phone] (Phone) [phone] (Fax)         Reason for call: Request refill on amiodarone (PACERONE) 200 MG tablet [[id number]][[id number]]

## 2015-11-10 ENCOUNTER — Other Ambulatory Visit: Payer: Self-pay | Admitting: Family Medicine

## 2015-11-12 DIAGNOSIS — S42309A Unspecified fracture of shaft of humerus, unspecified arm, initial encounter for closed fracture: Secondary | ICD-10-CM

## 2015-11-12 HISTORY — DX: Unspecified fracture of shaft of humerus, unspecified arm, initial encounter for closed fracture: S42.309A

## 2015-11-20 ENCOUNTER — Emergency Department (HOSPITAL_COMMUNITY): Payer: Commercial Managed Care - HMO

## 2015-11-20 ENCOUNTER — Emergency Department (HOSPITAL_COMMUNITY)
Admission: EM | Admit: 2015-11-20 | Discharge: 2015-11-21 | Disposition: A | Discharge status: Home / Self Care | Payer: Commercial Managed Care - HMO | Attending: Physician Assistant | Admitting: Physician Assistant

## 2015-11-20 ENCOUNTER — Telehealth: Payer: Self-pay | Admitting: Family Medicine

## 2015-11-20 ENCOUNTER — Encounter (HOSPITAL_COMMUNITY): Payer: Self-pay | Admitting: *Deleted

## 2015-11-20 ENCOUNTER — Other Ambulatory Visit: Payer: Self-pay | Admitting: Family Medicine

## 2015-11-20 DIAGNOSIS — Z7982 Long term (current) use of aspirin: Secondary | ICD-10-CM | POA: Diagnosis not present

## 2015-11-20 DIAGNOSIS — I119 Hypertensive heart disease without heart failure: Secondary | ICD-10-CM | POA: Insufficient documentation

## 2015-11-20 DIAGNOSIS — I2510 Atherosclerotic heart disease of native coronary artery without angina pectoris: Secondary | ICD-10-CM | POA: Insufficient documentation

## 2015-11-20 DIAGNOSIS — W1789XA Other fall from one level to another, initial encounter: Secondary | ICD-10-CM | POA: Insufficient documentation

## 2015-11-20 DIAGNOSIS — S42302A Unspecified fracture of shaft of humerus, left arm, initial encounter for closed fracture: Secondary | ICD-10-CM

## 2015-11-20 DIAGNOSIS — Z79899 Other long term (current) drug therapy: Secondary | ICD-10-CM | POA: Insufficient documentation

## 2015-11-20 DIAGNOSIS — S4992XA Unspecified injury of left shoulder and upper arm, initial encounter: Secondary | ICD-10-CM | POA: Diagnosis present

## 2015-11-20 DIAGNOSIS — Y999 Unspecified external cause status: Secondary | ICD-10-CM | POA: Insufficient documentation

## 2015-11-20 DIAGNOSIS — S42202A Unspecified fracture of upper end of left humerus, initial encounter for closed fracture: Secondary | ICD-10-CM | POA: Diagnosis not present

## 2015-11-20 DIAGNOSIS — Y939 Activity, unspecified: Secondary | ICD-10-CM | POA: Diagnosis not present

## 2015-11-20 DIAGNOSIS — Z951 Presence of aortocoronary bypass graft: Secondary | ICD-10-CM | POA: Insufficient documentation

## 2015-11-20 DIAGNOSIS — Y9289 Other specified places as the place of occurrence of the external cause: Secondary | ICD-10-CM | POA: Diagnosis not present

## 2015-11-20 DIAGNOSIS — M25522 Pain in left elbow: Secondary | ICD-10-CM

## 2015-11-20 DIAGNOSIS — Z87891 Personal history of nicotine dependence: Secondary | ICD-10-CM | POA: Diagnosis not present

## 2015-11-20 MED ORDER — OXYCODONE-ACETAMINOPHEN 5-325 MG PO TABS
ORAL_TABLET | ORAL | Status: AC
  Filled 2015-11-20: qty 1

## 2015-11-20 MED ORDER — DOCUSATE SODIUM 100 MG PO CAPS: 100.0000 mg | ORAL_CAPSULE | Freq: Two times a day (BID) | ORAL | 0 refills | Status: AC

## 2015-11-20 MED ORDER — IBUPROFEN 800 MG PO TABS
800.0000 mg | ORAL_TABLET | Freq: Once | ORAL | Status: AC
  Administered 2015-11-20: 800 mg via ORAL
  Filled 2015-11-20: qty 1

## 2015-11-20 MED ORDER — OXYCODONE-ACETAMINOPHEN 5-325 MG PO TABS
1.0000 | ORAL_TABLET | ORAL | Status: DC | PRN
  Administered 2015-11-20: 1 via ORAL
  Filled 2015-11-20: qty 1

## 2015-11-20 MED ORDER — HYDROCODONE-ACETAMINOPHEN 5-325 MG PO TABS: 1.0000 | ORAL_TABLET | Freq: Four times a day (QID) | ORAL | 0 refills | Status: AC | PRN

## 2015-11-20 NOTE — Care Management (Signed)
ED CM met with family to discuss recommendations for [facility] services. Patient and  Family are agreeable to [facility] services. Marland Kitchen

## 2015-11-20 NOTE — Telephone Encounter (Signed)
Hi Carolyn- I'd be glad to arrange an x-ray for her but I don't know how to order a mobile x-ray.  Do you know how to do this?

## 2015-11-20 NOTE — ED Triage Notes (Signed)
Pt states that she fell onto her desk yesterday. Denies LOC/dizziness prior to or from fall. Denies hitting her head. States that she went to [facility] today who sent her here due to an acute fracture of the left shoulder.

## 2015-11-20 NOTE — Telephone Encounter (Signed)
[facility] w/ assistance of Dr. Patsy Lageropland. Orders faxed to [phone], ATTN: [place]. Fax confirmation received. Called Marylene Landngela to inform her and she states since they had not heard from our office that they had a nurse come to the house and get the pt in the car and she already had an x-ray that showed broken shoulder and [facility] Dr. Patsy Lageropland notified, x-ray canceled. [facility] notified and orders canceled.

## 2015-11-20 NOTE — Telephone Encounter (Signed)
Caller name: Edwyna Perfectngela Pyon  Relation Self: Daughter in [place]  Call back number: [phone]    Reason for call:  Patient fell yesterday and patient is not mobile to come in for an office visit. Daughter in law stated PCP in the past [facility] and imaging was able to be completed. Informed Marylene Landngela ( daughter in law) PCP is out of the office will fwd to covering PCP. Daughter would like a follow up call this morning. Please advise

## 2015-11-20 NOTE — Telephone Encounter (Signed)
Called Jacqueline [name]. She reports [name] got up from chair by herself and had an unwitnessed fall at home. [name] had her walker at Jacqueline time of fall. Jacqueline [name] speculates that maybe it got stuck on carpet or Jacqueline [name] may have just lost her balance. Jacqueline [name] fell sideways and landed on her arm and hit her back against some furniture. She did not hit her head and did not lose consciousness. She has a red mark on her back that is not not hurting her, but her left elbow is hurting. Jacqueline [name] can feel a small bone move in Jacqueline patient's arm near Jacqueline elbow. There is no swelling or discoloration of Jacqueline arm. Jacqueline pain seems to be worse when moving Jacqueline arm. Per Jacqueline [name], Jacqueline patient self-reports that Jacqueline arm does not hurt that badly, but Jacqueline patient has dementia and her self-report is unreliable. Jacqueline [name] reports she is more argumentative than baseline since Jacqueline fall, which she thinks is due to Jacqueline [name] being in pain. Dr. Laury AxonLowne has arranged for mobile s-ray in Jacqueline Jacqueline home in [place] past for Jacqueline patient, as she is essentially homebound. Jacqueline [name] is requesting mobile x-ray of patients arm. Please advise.

## 2015-11-20 NOTE — ED Triage Notes (Signed)
Strong radial pulse present. Neuro intact. pts son has a CD of the xrays in hand.

## 2015-11-20 NOTE — Progress Notes (Signed)
We were able to order a mobile x-ray of her left elbow. I asked for the results to be called to me

## 2015-11-20 NOTE — ED Provider Notes (Signed)
[facility]-EMERGENCY DEPT Provider Note   CSN: [id number] Arrival date & time: 11/20/15  1546  First Provider Contact:  None       History   Chief Complaint Chief Complaint  Patient presents with  . Fall  . Shoulder Injury    HPI Jacqueline BRIERE is a 82 y.o. female.  The history is provided by the patient and a relative. No language interpreter was used.  Fall  This is a recurrent problem. The current episode started yesterday. The problem occurs rarely. The problem has not changed since onset.Pertinent negatives include no chest pain, no abdominal pain, no headaches and no shortness of breath. Exacerbated by: moving arm. Relieved by: immobilization. Treatments tried: immobilization. The treatment provided moderate relief.    Past Medical History:  Diagnosis Date  . Acute blood loss anemia   . Allergic rhinitis   . Anorexia   . Anxiety   . CAD (coronary artery disease)    a. 08/2012 Abnl MV;  b. 08/2012 CABGx5: LIMA->LAD, VG->Diag, VG->OM1->OM2, VG->RCA.  . Cerumen impaction   . Closed intertrochanteric fracture of left femur with routine healing   . Compression fracture of T12 vertebra (HCC)   . Constipation   . Dementia   . Depression   . Diverticulitis   . Dyslipidemia   . Generalized weakness   . GERD (gastroesophageal reflux disease)   . Hearing loss of aging   . HLD (hyperlipidemia)   . HLD (hyperlipidemia)   . Hypertensive heart disease   . Hyponatremia   . Insomnia   . Mild aortic stenosis    a. 08/2012 Echo: EF 60-65%, no rwma, mild AS, mildly dil LA, PASP .  . 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 Osteoporosis   . PAF (paroxysmal atrial fibrillation) (HCC)    a. Rhythm mgmt w/ amio;  b. CHA2DS2VASc = 5-->No anticoagulation 2/2 dementia/falls.  . Physical deconditioning   . Pressure ulcer   . Protein calorie malnutrition (HCC)   . Right knee pain   . Severe muscle deconditioning   . Unsteady gait   .  Urinary retention   . UTI (lower urinary tract infection)     Patient Active Problem List   Diagnosis Date Noted  . Diarrhea 06/04/2015  . Protein-calorie malnutrition, severe 06/03/2015  . Closed intertrochanteric fracture of left femur (HCC) 06/02/2015  . Hip fracture (HCC) 06/02/2015  . Falls frequently 06/02/2015  . PAF (paroxysmal atrial fibrillation) (HCC)   . Hypertensive heart disease   . Closed left hip fracture (HCC)   . Surgery, elective   . Community acquired pneumonia 04/26/2015  . Right upper lobe pneumonia 04/25/2015  . Fall 04/23/2015  . Generalized anxiety disorder 01/22/2015  . Anorexia 01/17/2015  . UTI (lower urinary tract infection) 01/16/2015  . Generalized weakness 01/16/2015  . Compression fracture of T12 vertebra (HCC)   . Hyponatremia 10/20/2014  . Pressure ulcer 10/20/2014  . Depression 10/18/2014  . Severe muscle deconditioning 10/18/2014  . Right knee pain 08/20/2014  . Hyperlipidemia 02/05/2014  . Physical exam 02/05/2014  . 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 Allergic rhinitis 10/05/2013  . Loss of weight 10/05/2013  . Protein calorie malnutrition (HCC) 10/05/2013  . SOB (shortness of breath) 08/17/2013  . CAD (coronary artery disease) 01/02/2013  . Paroxysmal atrial fibrillation (HCC) 01/02/2013  . Aortic stenosis 01/02/2013  . Confusion 11/21/2012  . Pancreatic lesion 11/21/2012  . Constipation, slow transit 11/21/2012  . Cerumen impaction 09/27/2012  .  Chest heaviness 08/03/2012  . Abnormal urine odor 08/03/2012  . Insomnia 07/04/2012  . Hearing loss of aging 07/04/2012  . Age-related bone loss 04/28/2012  . HTN (hypertension) 04/28/2012  . Reflux 04/28/2012    Past Surgical History:  Procedure Laterality Date  . CORONARY ARTERY BYPASS GRAFT N/A 08/25/2012   Procedure: CORONARY ARTERY BYPASS GRAFTING ;  Surgeon: Alleen Borne, MD;  Location: [facility] OR;  Service: Open Heart Surgery;  Laterality: N/A;  four  bypasses total   . FRACTURE SURGERY    . INTRAMEDULLARY (IM) NAIL INTERTROCHANTERIC Left 06/02/2015   Procedure: INTRAMEDULLARY (IM) NAIL INTERTROCHANTRIC;  Surgeon: Kathryne Hitch, MD;  Location: [facility] OR;  Service: Orthopedics;  Laterality: Left;  . LEFT HEART CATHETERIZATION WITH CORONARY ANGIOGRAM N/A 08/22/2012   Procedure: LEFT HEART CATHETERIZATION WITH CORONARY ANGIOGRAM;  Surgeon: Peter M Swaziland, MD;  Location: [facility] CATH LAB;  Service: Cardiovascular;  Laterality: N/A;  . PARTIAL COLECTOMY      OB History    No data available       Home Medications    Prior to Admission medications   Medication Sig Start Date End Date Taking? Authorizing Provider  acetaminophen (TYLENOL) 325 MG tablet Take 2 tablets (650 mg total) by mouth every 6 (six) hours as needed for mild pain or headache. 04/30/15   Zannie Cove, MD  alendronate (FOSAMAX) 70 MG tablet Take 1 tablet (70 mg total) by mouth once a week. Take with a full glass of water on an empty stomach on Tuesdays 08/12/15   Lelon Perla Chase, DO  amiodarone (PACERONE) 200 MG tablet Take 1 tablet (200 mg total) by mouth daily. 10/16/15   Lelon Perla Chase, DO  aspirin EC 325 MG tablet Take 325 mg by mouth every evening.     Historical Provider, MD  atorvastatin (LIPITOR) 40 MG tablet TAKE 1 TABLET BY MOUTH DAILY AT 6 PM 09/05/15   Lelon Perla Chase, DO  Calcium Carbonate-Vitamin D3 (RA CALCIUM 600/VITAMIN D-3) 600-400 MG-UNIT TABS Take 1 tablet by mouth 2 (two) times daily.    Historical Provider, MD  cephALEXin (KEFLEX) 500 MG capsule Take 1 capsule (500 mg total) by mouth 2 (two) times daily. 09/26/15   Lelon Perla Chase, DO  diazepam (VALIUM) 2 MG tablet Take 0.5 tablets (1 mg total) by mouth every 12 (twelve) hours as needed for anxiety. 06/06/15   Shanker Levora Dredge, MD  ferrous sulfate 325 (65 FE) MG tablet Take 325 mg by mouth 3 (three) times daily with meals.    Historical Provider, MD  folic acid (FOLVITE) 1 MG tablet TAKE 1  TABLET BY MOUTH EVERY DAY 11/12/15   Sheliah Hatch, MD  Lactobacillus ([employer] PROBIOTIC ACIDOPHILUS) 10 MG CAPS Take 1 capsule by mouth daily. 10/08/15   Grayling Congress Lowne Chase, DO  latanoprost (XALATAN) 0.005 % ophthalmic solution Place 1 drop into both eyes at bedtime.    Historical Provider, MD  memantine (NAMENDA) 10 MG tablet TAKE 1 TABLET (10 MG TOTAL) BY MOUTH 2 (TWO) TIMES DAILY. 02/07/15   Drema Dallas, DO  metoprolol tartrate (LOPRESSOR) 25 MG tablet Take 25 mg by mouth 2 (two) times daily. Hold for SBP <110 and HR <60    Historical Provider, MD  Multiple Vitamins-Minerals (CERTAVITE SENIOR/ANTIOXIDANT) TABS Take 0.5 tablets by mouth 2 (two) times daily.    Historical Provider, MD  promethazine (PHENERGAN) 12.5 MG tablet Take 12.5 mg by mouth every 6 (six) hours as needed for  nausea or vomiting. Q.6h. X 24h    Historical Provider, MD  sertraline (ZOLOFT) 25 MG tablet Take 1 tablet (25 mg total) by mouth daily. 10/08/15   Lelon PerlaYvonne R Lowne Chase, DO  simethicone (MYLICON) 125 MG chewable tablet Chew 125 mg by mouth every 8 (eight) hours as needed.     Historical Provider, MD  SODIUM CHLORIDE PO Take 1,000 mg by mouth daily.    Historical Provider, MD    Family History Family History  Problem Relation Age of Onset  . Cancer Mother     stomach  . Hypertension Other   . Diabetes Neg Hx   . Heart disease Neg Hx   . Stroke Neg Hx     Social History Social History  Substance Use Topics  . Smoking status: Former Smoker    Types: Cigarettes  . Smokeless tobacco: Never Used  . Alcohol use No     Comment: Occasional     Allergies   Review of patient's allergies indicates no known allergies.   Review of Systems Review of Systems  Constitutional: Negative.   HENT: Negative.   Eyes: Negative.  Negative for visual disturbance.  Respiratory: Negative for shortness of breath.   Cardiovascular: Negative for chest pain.  Gastrointestinal: Negative for abdominal pain.  Genitourinary:  Negative.   Musculoskeletal: Negative.   Skin: Negative.  Negative for pallor.  Neurological: Negative for syncope and headaches.  Psychiatric/Behavioral: Negative.      Physical Exam Updated Vital Signs BP 118/67 (BP Location: Right Arm)   Pulse (!) 53   Temp 97.1 F (36.2 C) (Oral)   Resp 22   Ht 5\' 6"  (1.676 m)   Wt 45.4 kg   SpO2 100%   BMI 16.14 kg/m   Physical Exam  Constitutional: She is oriented to person, place, and time. She appears well-developed and well-nourished. No distress.  HENT:  Head: Normocephalic and atraumatic.  Right Ear: External ear normal.  Left Ear: External ear normal.  Nose: Nose normal.  Mouth/Throat: Oropharynx is clear and moist.  No face or scalp tenderness/hematoma  Eyes: Conjunctivae and EOM are normal. Pupils are equal, round, and reactive to light.  Neck: Normal range of motion. Neck supple. No tracheal deviation present.  No neck pain  Cardiovascular: Normal rate, regular rhythm, normal heart sounds and intact distal pulses.   No murmur heard. Pulmonary/Chest: Effort normal and breath sounds normal. No stridor. No respiratory distress. She has no wheezes. She has no rales.  Abdominal: Soft. She exhibits no distension. There is no tenderness. There is no rebound and no guarding.  Musculoskeletal: She exhibits edema (L humerus), tenderness (to L shoulder and L humerus, no L elbow pain) and deformity (L humerus, 2/2 swelling).  LUE NVI Pelvis stable to compression and no hip pain  Neurological: She is alert and oriented to person, place, and time. She is not disoriented. GCS eye subscore is 4. GCS verbal subscore is 5. GCS motor subscore is 6.  Skin: Skin is warm and dry. Capillary refill takes less than 2 seconds. She is not diaphoretic.  Ecchymosis along L bicep  Psychiatric: She has a normal mood and affect. Her behavior is normal. Judgment normal.  Nursing note and vitals reviewed.    ED Treatments / Results  Labs (all labs  ordered are listed, but only abnormal results are displayed) Labs Reviewed - No data to display  EKG  EKG Interpretation None       Radiology No results found.  CXR  1. Mild left basilar atelectasis noted. No displaced rib fracture seen. 2. Displaced fracture of the proximal left humerus, as noted on left humerus radiographs. 3. Borderline cardiomegaly.  L elbow No evidence for acute abnormality of the left elbow.  Remote ORIF. Favor remote lower left rib fractures. Acute rib fractures for difficult to exclude. Consider rib views if needed.  L humerus 1. Acute fracture of the proximal humerus. 2. Remote and indeterminate age left rib fractures. Consider dedicated rib views.  L shoulder 1. Comminuted fracture of the left humerus. 2. Question of left rib fractures. Consider dedicated views of the chest/ribs as needed.  Procedures Procedures (including critical care time)  Medications Ordered in ED Medications  oxyCODONE-acetaminophen (PERCOCET/ROXICET) 5-325 MG per tablet 1 tablet (1 tablet Oral Given 11/20/15 1824)     Initial Impression / Assessment and Plan / ED Course  I have reviewed the triage vital signs and the nursing notes.  Pertinent labs & imaging results that were available during my care of the patient were reviewed by me and considered in my medical decision making (see chart for details).  Clinical Course   82 year old female with past medical history of osteoporosis and atrial fibrillation currently not on anticoagulation secondary to frequent falls presents to the emergency department 1 day after a fall.  Patient states she did not hit her head, there was no loss of consciousness.  States that she has had left upper extremity pain since.  Patient is neurovascular intact in left upper extremity.  Urgent care was concerned about proximal humerus fracture.  Repeat shoulder and humerus x-rays confirmed comminuted humerus fracture.  No other signs of trauma  noted on physical assessment.  Patient denies any other pain. Patient will need to in sling until follow up appointment with orthopedics. Patient was given 1 Percocet prior to evaluation and appeared to be doing well afterwards.  Patient does have a history of falls, however due to injury will still prescribe short course of narcotic pain medication.  Discussed with patient and son at bedside this was only to be used as needed for severe/breakthrough pain.  Ibuprofen would be appropriate for mild pain.  Social worker came and discussed possible reevaluation of needs for home health PT OT with patient prior to discharge.  Further emphasized with son that if patient is to be bedbound during healing process, patient would need to be turned every 2 hours to prevent ulcers.  Discussed need for follow-up with orthopedics.  All questions answered.  Patient and son are in agreement with plan.  Patient had elevated blood pressures in the emergency department however patient was asymptomatic as noted in review of systems.  Patient otherwise stable during stay in ED and safe for discharge.   [place] [facility] database was consulted prior to writing Rx for Norco. No concerns identified.   Final Clinical Impressions(s) / ED Diagnoses   Final diagnoses:  Closed fracture of left humerus    New Prescriptions Discharge Medication List as of 11/20/2015 11:39 PM    START taking these medications   Details  docusate sodium (COLACE) 100 MG capsule Take 1 capsule (100 mg total) by mouth every 12 (twelve) hours., Starting Wed 11/20/2015, Print      Norco 5/325, disp 20, 1 q6 hr PRN for severe pain   Maretta Bees, MD 11/23/15 1116    Courteney Lyn Mackuen, MD 11/26/15 1625

## 2015-11-20 NOTE — ED Notes (Signed)
Pt and family refused percocet, requested ibuprofen.

## 2015-11-21 NOTE — ED Provider Notes (Signed)
I saw and evaluated the patient, reviewed the resident's note and I agree with the findings and plan.   EKG Interpretation None      Patient's a very pleasant 82 year old female with mechanical fall. She is presenting with left humeral fracture. Images done here in our system show that she has comminuted left humeral fracture. Plan to do conservative treatment with sling and swath and have her follow-up with orthopedics.  Initial films showed questionable rib fractures. Patient had excellent incentive spirometer and no evidence of rib fractures on repeat films.  Patient and family member expressed understanding about return precautions and follow-up.   Dvonte Gatliff Randall AnLyn Mionna Advincula, MD 11/21/15 1545

## 2015-11-25 ENCOUNTER — Telehealth: Payer: Self-pay | Admitting: Family Medicine

## 2015-11-25 NOTE — Telephone Encounter (Signed)
When I get them I'll sign them

## 2015-11-25 NOTE — Telephone Encounter (Signed)
[phone] Larey SeatFell and broke her left arm, [facility] now they need a dr to sign off on orders.

## 2015-11-25 NOTE — Telephone Encounter (Signed)
To MD.    KP 

## 2015-11-26 NOTE — Telephone Encounter (Signed)
Jacqueline [name] has been made aware and said she will send the orders.      KP

## 2015-11-27 ENCOUNTER — Telehealth: Payer: Self-pay | Admitting: *Deleted

## 2015-11-27 NOTE — Telephone Encounter (Signed)
Received Professional Communication Orders via fax from [facility].  Placed in folder for Dr. Zola ButtonLowne Chase to review and sign.//AB/CMA

## 2015-11-28 NOTE — Telephone Encounter (Signed)
Received signed Professional Communication Orders from Dr. Zola ButtonLowne Chase.  Paperwork faxed to [facility].//AB/CMA

## 2015-12-03 ENCOUNTER — Telehealth: Payer: Self-pay | Admitting: Family Medicine

## 2015-12-03 NOTE — Telephone Encounter (Signed)
Please advise      KP 

## 2015-12-03 NOTE — Telephone Encounter (Signed)
Ok to refer to wound clinic And ok to order betadine

## 2015-12-03 NOTE — Telephone Encounter (Signed)
Verbal given to Donita.    KP

## 2015-12-03 NOTE — Telephone Encounter (Signed)
Caller name: Donita  Relationship to patient: Suzan SlickDonita  Can be reached: [phone]   Reason for call: Request order to use betadine daily and let it dry and continue to monitor. Also would like order for Wound Care Specialist to see patient for future recomendations.

## 2015-12-10 ENCOUNTER — Other Ambulatory Visit: Payer: Self-pay | Admitting: Family Medicine

## 2015-12-10 ENCOUNTER — Telehealth: Payer: Self-pay

## 2015-12-10 NOTE — Telephone Encounter (Signed)
Last seen 07/18/15 Pt request for Zoloft, this medication is not on med list. Please advise----PC

## 2015-12-10 NOTE — Telephone Encounter (Signed)
Received two orders via fax from [facility] for the following:  SN-Non-standard Protocol-Perform: 2 times a week  Clean with N/S. Apply Barrier cream to periwound skin.  Apply Medihoney alginate to fit wound bed. Cover with  Foam dressing.  Secure with rolled gauze 2 x week.    SN-Teach caregiver to perform dressing changes.  Caregiver to provide wound care when nurse not in home.    Both orders numbered and placed in Lowne's red folder for review and signature.

## 2015-12-11 ENCOUNTER — Telehealth: Payer: Self-pay | Admitting: *Deleted

## 2015-12-11 NOTE — Telephone Encounter (Addendum)
Received Home Health Certification and Plan of Care via fax from [facility].  Placed in folder for Dr. Zola ButtonLowne Chase to review and sign.//AB/CMA

## 2015-12-13 ENCOUNTER — Other Ambulatory Visit: Payer: Self-pay | Admitting: Family Medicine

## 2015-12-13 NOTE — Telephone Encounter (Signed)
Agree, discharge summary states metoprolol 25 mg twice a day, okay to refill for 3 months. He needs a follow-up with PCP

## 2015-12-13 NOTE — Telephone Encounter (Signed)
Please schedule the patient a follow up with PCP.    KP

## 2015-12-13 NOTE — Telephone Encounter (Signed)
Rx was changed at the hospital from 1/2 BID to 1 BID Please advise if it is ok to send the Metoprolol.     KP

## 2015-12-17 ENCOUNTER — Telehealth: Payer: Self-pay | Admitting: Family Medicine

## 2015-12-17 NOTE — Telephone Encounter (Signed)
Called and spoke with caregiver. Patient is homebound now and has doctors making house calls. Her care is being transferred to those doctors.

## 2015-12-17 NOTE — Telephone Encounter (Signed)
Caller name: Donnetta  Relation to pt: RN from [facility]  Call back number:[phone]    Reason for call: RN wanted to inform PCP patient right elbow wound dead tissue is falling off and patient has a plate in elbow and plate visual.   RN stated wound care advised to continue using medication  Medihoney alginate.

## 2015-12-17 NOTE — Telephone Encounter (Signed)
To PCP for FYI.  

## 2015-12-18 ENCOUNTER — Telehealth: Payer: Self-pay | Admitting: Family Medicine

## 2015-12-18 NOTE — Telephone Encounter (Signed)
Okay to give verbal orders?  ?

## 2015-12-18 NOTE — Telephone Encounter (Signed)
Signed. Faxed back.  Fax confirmation received.

## 2015-12-18 NOTE — Telephone Encounter (Signed)
We just got message that a dr was going to the house and we were not to call in orders any more--- we will need verification

## 2015-12-18 NOTE — Telephone Encounter (Signed)
To MD to advise.      KP 

## 2015-12-18 NOTE — Telephone Encounter (Signed)
Caller [facility]: [facility] Can be reached: [phone]   Reason for call: Calling to request VO for nutrition consult. Ok to leave msg on confidential VM.

## 2015-12-19 NOTE — Telephone Encounter (Signed)
Spoke with Donita and the doctor had not started  With her yet, he may come to the home tomorrow.    KP

## 2015-12-25 ENCOUNTER — Telehealth: Payer: Self-pay

## 2015-12-25 ENCOUNTER — Ambulatory Visit: Payer: Commercial Managed Care - HMO | Admitting: Neurology

## 2015-12-25 NOTE — Telephone Encounter (Signed)
-----   Message from Meryl DareMalcolm T Stark, MD sent at 12/24/2015  6:46 PM EDT ----- Given current health status we should cancel all further imaging studies.    ----- Message ----- From: Annett FabianSheri L Jones, RN Sent: 12/24/2015   8:24 AM To: Meryl DareMalcolm T Stark, MD  Patient is due for a 1 year follow up for pancreatic tail cystic lesion from last year.  Patient looks to have been admitted to Hospice after multiple fractures.  She did have a CT in January that said the lesion was unchanged.  Does she still need MRI?  Sheri ----- Message ----- From: Annett FabianSheri L Jones, RN Sent: 12/24/2015 To: Annett FabianSheri L Jones, RN  Needs MRI see results 12/2014- Russella DarStark

## 2016-01-15 ENCOUNTER — Emergency Department (HOSPITAL_COMMUNITY): Payer: Medicare HMO

## 2016-01-15 ENCOUNTER — Encounter (HOSPITAL_COMMUNITY): Payer: Self-pay

## 2016-01-15 ENCOUNTER — Inpatient Hospital Stay (HOSPITAL_COMMUNITY)
Admission: EM | Admit: 2016-01-15 | Discharge: 2016-02-12 | DRG: 871 | Disposition: E | Payer: Medicare HMO | Attending: Internal Medicine | Admitting: Internal Medicine

## 2016-01-15 DIAGNOSIS — I5032 Chronic diastolic (congestive) heart failure: Secondary | ICD-10-CM | POA: Diagnosis present

## 2016-01-15 DIAGNOSIS — Z8249 Family history of ischemic heart disease and other diseases of the circulatory system: Secondary | ICD-10-CM

## 2016-01-15 DIAGNOSIS — K830 Cholangitis: Secondary | ICD-10-CM | POA: Diagnosis not present

## 2016-01-15 DIAGNOSIS — J9601 Acute respiratory failure with hypoxia: Secondary | ICD-10-CM

## 2016-01-15 DIAGNOSIS — R531 Weakness: Secondary | ICD-10-CM

## 2016-01-15 DIAGNOSIS — H409 Unspecified glaucoma: Secondary | ICD-10-CM | POA: Diagnosis present

## 2016-01-15 DIAGNOSIS — R0602 Shortness of breath: Secondary | ICD-10-CM

## 2016-01-15 DIAGNOSIS — F329 Major depressive disorder, single episode, unspecified: Secondary | ICD-10-CM | POA: Diagnosis present

## 2016-01-15 DIAGNOSIS — I350 Nonrheumatic aortic (valve) stenosis: Secondary | ICD-10-CM | POA: Diagnosis present

## 2016-01-15 DIAGNOSIS — R630 Anorexia: Secondary | ICD-10-CM | POA: Diagnosis present

## 2016-01-15 DIAGNOSIS — M810 Age-related osteoporosis without current pathological fracture: Secondary | ICD-10-CM | POA: Diagnosis present

## 2016-01-15 DIAGNOSIS — F419 Anxiety disorder, unspecified: Secondary | ICD-10-CM | POA: Diagnosis present

## 2016-01-15 DIAGNOSIS — I420 Dilated cardiomyopathy: Secondary | ICD-10-CM

## 2016-01-15 DIAGNOSIS — K219 Gastro-esophageal reflux disease without esophagitis: Secondary | ICD-10-CM | POA: Diagnosis present

## 2016-01-15 DIAGNOSIS — Z66 Do not resuscitate: Secondary | ICD-10-CM | POA: Diagnosis present

## 2016-01-15 DIAGNOSIS — R0603 Acute respiratory distress: Secondary | ICD-10-CM

## 2016-01-15 DIAGNOSIS — E876 Hypokalemia: Secondary | ICD-10-CM | POA: Diagnosis present

## 2016-01-15 DIAGNOSIS — Z9181 History of falling: Secondary | ICD-10-CM

## 2016-01-15 DIAGNOSIS — I110 Hypertensive heart disease with heart failure: Secondary | ICD-10-CM | POA: Diagnosis present

## 2016-01-15 DIAGNOSIS — R29898 Other symptoms and signs involving the musculoskeletal system: Secondary | ICD-10-CM | POA: Diagnosis present

## 2016-01-15 DIAGNOSIS — Z7982 Long term (current) use of aspirin: Secondary | ICD-10-CM

## 2016-01-15 DIAGNOSIS — L8990 Pressure ulcer of unspecified site, unspecified stage: Secondary | ICD-10-CM | POA: Diagnosis present

## 2016-01-15 DIAGNOSIS — E872 Acidosis: Secondary | ICD-10-CM | POA: Diagnosis present

## 2016-01-15 DIAGNOSIS — F411 Generalized anxiety disorder: Secondary | ICD-10-CM | POA: Diagnosis present

## 2016-01-15 DIAGNOSIS — E871 Hypo-osmolality and hyponatremia: Secondary | ICD-10-CM | POA: Diagnosis present

## 2016-01-15 DIAGNOSIS — R001 Bradycardia, unspecified: Secondary | ICD-10-CM

## 2016-01-15 DIAGNOSIS — Z87891 Personal history of nicotine dependence: Secondary | ICD-10-CM

## 2016-01-15 DIAGNOSIS — J80 Acute respiratory distress syndrome: Secondary | ICD-10-CM | POA: Diagnosis present

## 2016-01-15 DIAGNOSIS — I10 Essential (primary) hypertension: Secondary | ICD-10-CM | POA: Diagnosis not present

## 2016-01-15 DIAGNOSIS — I4891 Unspecified atrial fibrillation: Secondary | ICD-10-CM | POA: Diagnosis not present

## 2016-01-15 DIAGNOSIS — I480 Paroxysmal atrial fibrillation: Secondary | ICD-10-CM | POA: Diagnosis present

## 2016-01-15 DIAGNOSIS — H9190 Unspecified hearing loss, unspecified ear: Secondary | ICD-10-CM | POA: Diagnosis present

## 2016-01-15 DIAGNOSIS — K8040 Calculus of bile duct with cholecystitis, unspecified, without obstruction: Secondary | ICD-10-CM | POA: Diagnosis present

## 2016-01-15 DIAGNOSIS — R339 Retention of urine, unspecified: Secondary | ICD-10-CM | POA: Diagnosis present

## 2016-01-15 DIAGNOSIS — J690 Pneumonitis due to inhalation of food and vomit: Secondary | ICD-10-CM | POA: Diagnosis present

## 2016-01-15 DIAGNOSIS — D72829 Elevated white blood cell count, unspecified: Secondary | ICD-10-CM

## 2016-01-15 DIAGNOSIS — E785 Hyperlipidemia, unspecified: Secondary | ICD-10-CM | POA: Diagnosis present

## 2016-01-15 DIAGNOSIS — J189 Pneumonia, unspecified organism: Secondary | ICD-10-CM | POA: Diagnosis not present

## 2016-01-15 DIAGNOSIS — H9110 Presbycusis, unspecified ear: Secondary | ICD-10-CM | POA: Diagnosis present

## 2016-01-15 DIAGNOSIS — F0390 Unspecified dementia without behavioral disturbance: Secondary | ICD-10-CM | POA: Diagnosis present

## 2016-01-15 DIAGNOSIS — K8309 Other cholangitis: Secondary | ICD-10-CM

## 2016-01-15 DIAGNOSIS — Z515 Encounter for palliative care: Secondary | ICD-10-CM | POA: Diagnosis present

## 2016-01-15 DIAGNOSIS — I2510 Atherosclerotic heart disease of native coronary artery without angina pectoris: Secondary | ICD-10-CM | POA: Diagnosis present

## 2016-01-15 DIAGNOSIS — Z79899 Other long term (current) drug therapy: Secondary | ICD-10-CM

## 2016-01-15 DIAGNOSIS — A419 Sepsis, unspecified organism: Principal | ICD-10-CM | POA: Diagnosis present

## 2016-01-15 HISTORY — DX: Other injury of unspecified body region, initial encounter: T14.8XXA

## 2016-01-15 HISTORY — DX: Fracture of unspecified part of neck of unspecified femur, initial encounter for closed fracture: S72.009A

## 2016-01-15 HISTORY — DX: Unspecified fracture of shaft of humerus, unspecified arm, initial encounter for closed fracture: S42.309A

## 2016-01-15 HISTORY — DX: Extended spectrum beta lactamase (ESBL) resistance: Z16.12

## 2016-01-15 HISTORY — DX: Bacterial infection, unspecified: A49.9

## 2016-01-15 HISTORY — DX: Unspecified glaucoma: H40.9

## 2016-01-15 LAB — CBC WITH DIFFERENTIAL/PLATELET
BASOS ABS: 0 10*3/uL (ref 0.0–0.1)
BASOS PCT: 0 %
BASOS PCT: 0 %
Basophils Absolute: 0 10*3/uL (ref 0.0–0.1)
EOS ABS: 0 10*3/uL (ref 0.0–0.7)
Eosinophils Absolute: 0 10*3/uL (ref 0.0–0.7)
Eosinophils Relative: 0 %
Eosinophils Relative: 0 %
HCT: 33.6 % — ABNORMAL LOW (ref 36.0–46.0)
HEMATOCRIT: 32.5 % — AB (ref 36.0–46.0)
HEMOGLOBIN: 10.7 g/dL — AB (ref 12.0–15.0)
HEMOGLOBIN: 11.1 g/dL — AB (ref 12.0–15.0)
LYMPHS ABS: 0.5 10*3/uL — AB (ref 0.7–4.0)
Lymphocytes Relative: 3 %
Lymphocytes Relative: 3 %
Lymphs Abs: 0.5 10*3/uL — ABNORMAL LOW (ref 0.7–4.0)
MCH: 33 pg (ref 26.0–34.0)
MCH: 33.2 pg (ref 26.0–34.0)
MCHC: 32.9 g/dL (ref 30.0–36.0)
MCHC: 33 g/dL (ref 30.0–36.0)
MCV: 100.3 fL — AB (ref 78.0–100.0)
MCV: 100.6 fL — ABNORMAL HIGH (ref 78.0–100.0)
Monocytes Absolute: 0.6 10*3/uL (ref 0.1–1.0)
Monocytes Absolute: 0.6 10*3/uL (ref 0.1–1.0)
Monocytes Relative: 4 %
Monocytes Relative: 4 %
NEUTROS ABS: 14.3 10*3/uL — AB (ref 1.7–7.7)
NEUTROS PCT: 93 %
NEUTROS PCT: 93 %
Neutro Abs: 13.7 10*3/uL — ABNORMAL HIGH (ref 1.7–7.7)
Platelets: 263 10*3/uL (ref 150–400)
Platelets: 259 10*3/uL (ref 150–400)
RBC: 3.24 MIL/uL — AB (ref 3.87–5.11)
RBC: 3.34 MIL/uL — AB (ref 3.87–5.11)
RDW: 15.6 % — AB (ref 11.5–15.5)
RDW: 15.3 % (ref 11.5–15.5)
WBC: 15.3 10*3/uL — AB (ref 4.0–10.5)
WBC: 14.9 10*3/uL — AB (ref 4.0–10.5)

## 2016-01-15 LAB — COMPREHENSIVE METABOLIC PANEL
ALBUMIN: 2.4 g/dL — AB (ref 3.5–5.0)
ALBUMIN: 2.4 g/dL — AB (ref 3.5–5.0)
ALK PHOS: 154 U/L — AB (ref 38–126)
ALK PHOS: 147 U/L — AB (ref 38–126)
ALT: 283 U/L — ABNORMAL HIGH (ref 14–54)
ALT: 285 U/L — ABNORMAL HIGH (ref 14–54)
ANION GAP: 8 (ref 5–15)
AST: 279 U/L — AB (ref 15–41)
AST: 286 U/L — ABNORMAL HIGH (ref 15–41)
Anion gap: 9 (ref 5–15)
BILIRUBIN TOTAL: 1.3 mg/dL — AB (ref 0.3–1.2)
BUN: 21 mg/dL — AB (ref 6–20)
BUN: 18 mg/dL (ref 6–20)
CALCIUM: 8.3 mg/dL — AB (ref 8.9–10.3)
CALCIUM: 8 mg/dL — AB (ref 8.9–10.3)
CO2: 22 mmol/L (ref 22–32)
CO2: 24 mmol/L (ref 22–32)
CREATININE: 0.63 mg/dL (ref 0.44–1.00)
Chloride: 98 mmol/L — ABNORMAL LOW (ref 101–111)
Chloride: 98 mmol/L — ABNORMAL LOW (ref 101–111)
Creatinine, Ser: 0.6 mg/dL (ref 0.44–1.00)
GFR calc Af Amer: >60 mL/min (ref >60)
GFR calc Af Amer: >60 mL/min (ref >60)
GFR calc non Af Amer: >60 mL/min (ref >60)
GLUCOSE: 115 mg/dL — AB (ref 65–99)
GLUCOSE: 120 mg/dL — AB (ref 65–99)
Potassium: 4 mmol/L (ref 3.5–5.1)
Potassium: 3.5 mmol/L (ref 3.5–5.1)
SODIUM: 131 mmol/L — AB (ref 135–145)
Sodium: 128 mmol/L — ABNORMAL LOW (ref 135–145)
TOTAL PROTEIN: 5.6 g/dL — AB (ref 6.5–8.1)
Total Bilirubin: 1.2 mg/dL (ref 0.3–1.2)
Total Protein: 5.3 g/dL — ABNORMAL LOW (ref 6.5–8.1)

## 2016-01-15 LAB — I-STAT ARTERIAL BLOOD GAS, ED
Acid-Base Excess: 3 mmol/L — ABNORMAL HIGH (ref 0.0–2.0)
BICARBONATE: 26.7 mmol/L (ref 20.0–28.0)
O2 Saturation: 98 %
PCO2 ART: 40.2 mmHg (ref 32.0–48.0)
PO2 ART: 108 mmHg (ref 83.0–108.0)
TCO2: 28 mmol/L (ref 0–100)
pH, Arterial: 7.434 (ref 7.350–7.450)

## 2016-01-15 LAB — PROTIME-INR
INR: 1.42
Prothrombin Time: 17.5 seconds — ABNORMAL HIGH (ref 11.4–15.2)

## 2016-01-15 LAB — I-STAT CG4 LACTIC ACID, ED
Lactic Acid, Venous: 1.11 mmol/L (ref 0.5–1.9)
Lactic Acid, Venous: 0.94 mmol/L (ref 0.5–1.9)

## 2016-01-15 LAB — LACTIC ACID, PLASMA
Lactic Acid, Venous: 1.2 mmol/L (ref 0.5–1.9)
Lactic Acid, Venous: 1.1 mmol/L (ref 0.5–1.9)

## 2016-01-15 LAB — MRSA PCR SCREENING: MRSA by PCR: NEGATIVE

## 2016-01-15 LAB — PROCALCITONIN: Procalcitonin: 0.12 ng/mL

## 2016-01-15 LAB — APTT: aPTT: 35 seconds (ref 24–36)

## 2016-01-15 MED ORDER — SODIUM CHLORIDE 0.9 % IV BOLUS (SEPSIS)
1000.0000 mL | Freq: Once | INTRAVENOUS | Status: AC
  Administered 2016-01-15: 1000 mL via INTRAVENOUS

## 2016-01-15 MED ORDER — TAMSULOSIN HCL 0.4 MG PO CAPS: 0.4000 mg | ORAL_CAPSULE | Freq: Every day | ORAL | Status: DC

## 2016-01-15 MED ORDER — ATORVASTATIN CALCIUM 40 MG PO TABS: 40.0000 mg | ORAL_TABLET | Freq: Every day | ORAL | Status: DC

## 2016-01-15 MED ORDER — VANCOMYCIN HCL 500 MG IV SOLR
500.0000 mg | INTRAVENOUS | Status: DC
  Administered 2016-01-16 – 2016-01-18 (×3): 500 mg via INTRAVENOUS
  Filled 2016-01-15 (×4): qty 500

## 2016-01-15 MED ORDER — ENOXAPARIN SODIUM 40 MG/0.4ML ~~LOC~~ SOLN
40.0000 mg | SUBCUTANEOUS | Status: DC
  Administered 2016-01-15 – 2016-01-18 (×4): 40 mg via SUBCUTANEOUS
  Filled 2016-01-15 (×4): qty 0.4

## 2016-01-15 MED ORDER — METOPROLOL TARTRATE 25 MG PO TABS
25.0000 mg | ORAL_TABLET | Freq: Two times a day (BID) | ORAL | Status: DC
  Filled 2016-01-15: qty 1

## 2016-01-15 MED ORDER — VANCOMYCIN HCL IN DEXTROSE 1-5 GM/200ML-% IV SOLN
1000.0000 mg | Freq: Once | INTRAVENOUS | Status: AC
  Administered 2016-01-15: 1000 mg via INTRAVENOUS
  Filled 2016-01-15: qty 200

## 2016-01-15 MED ORDER — FUROSEMIDE 10 MG/ML IJ SOLN
20.0000 mg | Freq: Once | INTRAMUSCULAR | Status: AC
  Administered 2016-01-15: 20 mg via INTRAVENOUS
  Filled 2016-01-15: qty 2

## 2016-01-15 MED ORDER — ASPIRIN EC 325 MG PO TBEC: 325.0000 mg | DELAYED_RELEASE_TABLET | Freq: Every evening | ORAL | Status: DC

## 2016-01-15 MED ORDER — SERTRALINE HCL 50 MG PO TABS
25.0000 mg | ORAL_TABLET | Freq: Every day | ORAL | Status: DC
  Filled 2016-01-15: qty 1

## 2016-01-15 MED ORDER — AMIODARONE HCL 200 MG PO TABS
200.0000 mg | ORAL_TABLET | Freq: Every day | ORAL | Status: DC
  Filled 2016-01-15: qty 1

## 2016-01-15 MED ORDER — PIPERACILLIN-TAZOBACTAM 3.375 G IVPB 30 MIN
3.3750 g | Freq: Once | INTRAVENOUS | Status: AC
  Administered 2016-01-15: 3.375 g via INTRAVENOUS
  Filled 2016-01-15: qty 50

## 2016-01-15 MED ORDER — PIPERACILLIN-TAZOBACTAM 3.375 G IVPB
3.3750 g | Freq: Three times a day (TID) | INTRAVENOUS | Status: DC
  Administered 2016-01-15 – 2016-01-19 (×12): 3.375 g via INTRAVENOUS
  Filled 2016-01-15 (×14): qty 50

## 2016-01-15 MED ORDER — MEMANTINE HCL 10 MG PO TABS
10.0000 mg | ORAL_TABLET | Freq: Two times a day (BID) | ORAL | Status: DC
  Filled 2016-01-15: qty 1

## 2016-01-15 MED ORDER — SODIUM CHLORIDE 0.9 % IV SOLN
INTRAVENOUS | Status: DC
  Administered 2016-01-15 – 2016-01-16 (×2): via INTRAVENOUS

## 2016-01-15 NOTE — ED Provider Notes (Signed)
[facility]-EMERGENCY DEPT Provider Note   CSN: [id number] Arrival date & time: 02/10/2016  1238     History   Chief Complaint Chief Complaint  Patient presents with  . Code Sepsis  . Weakness    HPI Jacqueline [name] is a 82 y.o. female.  HPI 82 year old female with past medical history of coronary artery disease dementia who presents with confusion and weakness. According to the EMS report, patient has reportedly had several days of shortness of breath and cough. She became more confused today and subsequently was brought to the emergency department. On my assessment, history limited due to dementia and work of breathing.  Level 5 caveat invoked as remainder of history, ROS, and physical exam limited due to patient's dementia, WOB.   Past Medical History:  Diagnosis Date  . Acute blood loss anemia   . Allergic rhinitis   . Anorexia   . Anxiety   . CAD (coronary artery disease)    a. 08/2012 Abnl MV;  b. 08/2012 CABGx5: LIMA->LAD, VG->Diag, VG->OM1->OM2, VG->RCA.  . Cerumen impaction   . Closed intertrochanteric fracture of left femur with routine healing   . Compression fracture of T12 vertebra (HCC)   . Constipation   . Dementia   . Depression   . Diverticulitis   . Dyslipidemia   . ESBL (extended spectrum beta-lactamase) producing bacteria infection 04/2015   Urine  . Fracture of bone    left arm  . Generalized weakness   . GERD (gastroesophageal reflux disease)   . Glaucoma    bilateral  . Hearing loss of aging   . Hip fracture (HCC)    right  . HLD (hyperlipidemia)   . HLD (hyperlipidemia)   . Humerus fracture 11/2015  . Hypertensive heart disease   . Hyponatremia   . Insomnia   . Mild aortic stenosis    a. 08/2012 Echo: EF 60-65%, no rwma, mild AS, mildly dil LA, PASP 33mmHg.  . 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 Osteoporosis   . PAF (paroxysmal atrial fibrillation) (HCC)    a. Rhythm mgmt w/ amio;  b.  CHA2DS2VASc = 5-->No anticoagulation 2/2 dementia/falls.  . Physical deconditioning   . Pressure ulcer   . Protein calorie malnutrition (HCC)   . Right knee pain   . Severe muscle deconditioning   . Unsteady gait   . Urinary retention   . UTI (lower urinary tract infection)     Patient Active Problem List   Diagnosis Date Noted  . Sepsis (HCC) 01/12/2016  . Respiratory distress, acute 02/08/2016  . Diarrhea 06/04/2015  . Protein-calorie malnutrition, severe 06/03/2015  . Closed intertrochanteric fracture of left femur (HCC) 06/02/2015  . Hip fracture (HCC) 06/02/2015  . Falls frequently 06/02/2015  . PAF (paroxysmal atrial fibrillation) (HCC)   . Hypertensive heart disease   . Closed left hip fracture (HCC)   . Surgery, elective   . Multifocal pneumonia 04/26/2015  . Right upper lobe pneumonia (HCC) 04/25/2015  . Fall 04/23/2015  . Generalized anxiety disorder 01/22/2015  . Anorexia 01/17/2015  . UTI (lower urinary tract infection) 01/16/2015  . Generalized weakness 01/16/2015  . Compression fracture of T12 vertebra (HCC)   . Hyponatremia 10/20/2014  . Pressure ulcer 10/20/2014  . Depression 10/18/2014  . Severe muscle deconditioning 10/18/2014  . Right knee pain 08/20/2014  . Hyperlipidemia 02/05/2014  . Physical exam 02/05/2014  . 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 Allergic rhinitis 10/05/2013  . Loss of  weight 10/05/2013  . Protein calorie malnutrition (HCC) 10/05/2013  . SOB (shortness of breath) 08/17/2013  . CAD (coronary artery disease) 01/02/2013  . Paroxysmal atrial fibrillation (HCC) 01/02/2013  . Aortic stenosis 01/02/2013  . Confusion 11/21/2012  . Pancreatic lesion 11/21/2012  . Constipation, slow transit 11/21/2012  . Cerumen impaction 09/27/2012  . Chest heaviness 08/03/2012  . Abnormal urine odor 08/03/2012  . Insomnia 07/04/2012  . Hearing loss of aging 07/04/2012  . Age-related bone loss 04/28/2012  . HTN  (hypertension) 04/28/2012  . Reflux 04/28/2012    Past Surgical History:  Procedure Laterality Date  . CATARACT EXTRACTION, BILATERAL    . CORONARY ARTERY BYPASS GRAFT N/A 08/25/2012   Procedure: CORONARY ARTERY BYPASS GRAFTING ;  Surgeon: Alleen Borne, MD;  Location: [facility] OR;  Service: Open Heart Surgery;  Laterality: N/A;  four bypasses total   . FRACTURE SURGERY    . INTRAMEDULLARY (IM) NAIL INTERTROCHANTERIC Left 06/02/2015   Procedure: INTRAMEDULLARY (IM) NAIL INTERTROCHANTRIC;  Surgeon: Kathryne Hitch, MD;  Location: [facility] OR;  Service: Orthopedics;  Laterality: Left;  . LEFT HEART CATHETERIZATION WITH CORONARY ANGIOGRAM N/A 08/22/2012   Procedure: LEFT HEART CATHETERIZATION WITH CORONARY ANGIOGRAM;  Surgeon: Peter M Swaziland, MD;  [facility] C[facility] LAB;  Service: Cardiovascular;  Laterality: N/A;  . PARTIAL COLECTOMY      OB History    No data available       Home Medications    Prior to Admission medications   Medication Sig Start Date End Date Taking? Authorizing Provider  alendronate (FOSAMAX) 70 MG tablet Take 1 tablet (70 mg total) by mouth once a week. Take with a full glass of water on an empty stomach on Tuesdays Patient taking differently: Take 70 mg by mouth every Saturday. Take with a full glass of water on an empty stomach 08/12/15  Yes [name], DO  amiodarone (PACERONE) 200 MG tablet Take 1 tablet (200 mg total) by mouth daily. 10/16/15  Yes [name], DO  aspirin EC 325 MG tablet Take 325 mg by mouth every evening.    Yes Historical Provider, MD  atorvastatin (LIPITOR) 40 MG tablet TAKE 1 TABLET BY MOUTH DAILY AT 6 PM Patient taking differently: Take 40 mg by mouth daily at 6 PM.  09/05/15  Yes [name], DO  Calcium Carbonate-Vitamin D3 (RA CALCIUM 600/VITAMIN D-3) 600-400 MG-UNIT TABS Take 0.5 tablets by mouth 2 (two) times daily.    Yes Historical Provider, MD  ferrous sulfate 325 (65 FE) MG tablet Take 325 mg by mouth 2 (two)  times daily with a meal.    Yes Historical Provider, MD  folic acid (FOLVITE) 1 MG tablet TAKE 1 TABLET BY MOUTH EVERY DAY Patient taking differently: Take 1 mg by mouth every day 11/12/15  Yes Sheliah Hatch, MD  LUMIGAN 0.01 % SOLN Place 1 drop into both eyes at bedtime. 11/12/15  Yes Historical Provider, MD  memantine (NAMENDA) 10 MG tablet TAKE 1 TABLET (10 MG TOTAL) BY MOUTH 2 (TWO) TIMES DAILY. 02/07/15  Yes Adam Mliss Fritz, DO  metoprolol tartrate (LOPRESSOR) 25 MG tablet Take 1 tablet (25 mg total) by mouth 2 (two) times daily. 12/13/15  Yes Lelon Perla Chase, DO  Multiple Vitamin (MULTIVITAMIN WITH MINERALS) TABS tablet Take 0.5 tablets by mouth 2 (two) times daily.    Yes Historical Provider, MD  Probiotic Product (ALIGN) 4 MG CAPS Take 4 mg by mouth daily.   Yes  Historical Provider, MD  Protein POWD Take 1 scoop by mouth 2 (two) times daily.   Yes Historical Provider, MD  sertraline (ZOLOFT) 25 MG tablet TAKE 1 TABLET (25 MG TOTAL) BY MOUTH DAILY. 12/10/15  Yes [name], DO  simethicone (MYLICON) 125 MG chewable tablet Chew 125 mg by mouth daily.    Yes Historical Provider, MD  SODIUM CHLORIDE PO Take 1,000 mg by mouth daily.   Yes Historical Provider, MD  tamsulosin (FLOMAX) 0.4 MG CAPS capsule Take 0.4 mg by mouth daily after breakfast.   Yes Historical Provider, MD  acetaminophen (TYLENOL) 325 MG tablet Take 2 tablets (650 mg total) by mouth every 6 (six) hours as needed for mild pain or headache. Patient not taking: Reported on 11/20/2015 04/30/15   Zannie Cove, MD  diazepam (VALIUM) 2 MG tablet Take 0.5 tablets (1 mg total) by mouth every 12 (twelve) hours as needed for anxiety. Patient not taking: Reported on 01/27/2016 06/06/15   Maretta Bees, MD  docusate sodium (COLACE) 100 MG capsule Take 1 capsule (100 mg total) by mouth every 12 (twelve) hours. Patient not taking: Reported on 01-27-16 11/20/15   Maretta Bees, MD  HYDROcodone-acetaminophen (NORCO/VICODIN) 5-325 MG  tablet Take 1 tablet by mouth every 6 (six) hours as needed for severe pain. 11/20/15   Maretta Bees, MD  Lactobacillus ([employer] PROBIOTIC ACIDOPHILUS) 10 MG CAPS Take 1 capsule by mouth daily. Patient not taking: Reported on January 27, 2016 10/08/15   Donato Schultz, DO    Family History Family History  Problem Relation Age of Onset  . Cancer Mother     stomach  . Hypertension Other   . Diabetes Neg Hx   . Heart disease Neg Hx   . Stroke Neg Hx     Social History Social History  Substance Use Topics  . Smoking status: Former Smoker    Types: Cigarettes  . Smokeless tobacco: Never Used  . Alcohol use No     Comment: Occasional     Allergies   Review of patient's allergies indicates no known allergies.   Review of Systems Review of Systems  Unable to perform ROS: Mental status change     Physical Exam Updated Vital Signs BP 156/82   Pulse (!) 54   Temp 100.3 F (37.9 C) (Rectal)   Resp 25   Ht 5\' 6"  (1.676 m)   Wt 105 lb (47.6 kg)   SpO2 95%   BMI 16.95 kg/m   Physical Exam  Constitutional: Vital signs are normal. She appears well-developed. She has a sickly appearance. She appears ill. She appears distressed. Face mask in place.  HENT:  Head: Normocephalic and atraumatic.  Mouth/Throat: No oropharyngeal exudate.  Dry mucous membranes  Eyes: Conjunctivae are normal. Pupils are equal, round, and reactive to light.  Neck: Neck supple.  Cardiovascular: Normal rate, regular rhythm and normal heart sounds.  Exam reveals no friction rub.   No murmur heard. Pulmonary/Chest: Effort normal. Tachypnea noted. No respiratory distress. She has no wheezes. She has rhonchi in the right lower field and the left lower field. She has rales in the right lower field and the left lower field.  Abdominal: Soft. Bowel sounds are normal. She exhibits no distension. There is no tenderness.  Musculoskeletal: She exhibits no edema.  Neurological: She is alert. She exhibits normal muscle  tone.  Skin: Skin is warm. Capillary refill takes less than 2 seconds. No rash noted.  Psychiatric: She has a normal mood  and affect.  Nursing note and vitals reviewed.    ED Treatments / Results  Labs (all labs ordered are listed, but only abnormal results are displayed) Labs Reviewed  COMPREHENSIVE METABOLIC PANEL - Abnormal; Notable for the following:       Result Value   Sodium 128 (*)    Chloride 98 (*)    Glucose, Bld 115 (*)    BUN 21 (*)    Calcium 8.3 (*)    Total Protein 5.6 (*)    Albumin 2.4 (*)    AST 279 (*)    ALT 283 (*)    Alkaline Phosphatase 154 (*)    Total Bilirubin 1.3 (*)    All other components within normal limits  CBC WITH DIFFERENTIAL/PLATELET - Abnormal; Notable for the following:    WBC 15.3 (*)    RBC 3.24 (*)    Hemoglobin 10.7 (*)    HCT 32.5 (*)    MCV 100.3 (*)    RDW 15.6 (*)    Neutro Abs 14.3 (*)    Lymphs Abs 0.5 (*)    All other components within normal limits  I-STAT ARTERIAL BLOOD GAS, ED - Abnormal; Notable for the following:    Acid-Base Excess 3.0 (*)    All other components within normal limits  CULTURE, BLOOD (ROUTINE X 2)  CULTURE, BLOOD (ROUTINE X 2)  URINE CULTURE  CULTURE, EXPECTORATED SPUTUM-ASSESSMENT  GRAM STAIN  URINE CULTURE  URINALYSIS, ROUTINE W REFLEX MICROSCOPIC (NOT AT [facility])  CBC WITH DIFFERENTIAL/PLATELET  COMPREHENSIVE METABOLIC PANEL  LACTIC ACID, PLASMA  LACTIC ACID, PLASMA  PROCALCITONIN  PROTIME-INR  APTT  LEGIONELLA PNEUMOPHILA SEROGP 1 UR AG  STREP PNEUMONIAE URINARY ANTIGEN  COMPREHENSIVE METABOLIC PANEL  CBC  I-STAT CG4 LACTIC ACID, ED  I-STAT CG4 LACTIC ACID, ED    EKG  EKG Interpretation  Date/Time:  Wednesday 2016-01-29 12:43:35 EDT Ventricular Rate:  59 PR Interval:    QRS Duration: 132 QT Interval:  482 QTC Calculation: 478 R Axis:   82 Text Interpretation:  Sinus rhythm Probable left atrial enlargement Right bundle branch block No significant change since last tracing  Confirmed by Kamil Hanigan MD, Sheria Lang [phone]) on 01/29/2016 5:31:46 PM       Radiology Ct Head Wo Contrast  Result Date: 01-29-2016 CLINICAL DATA:  Weakness and left-sided facial droop. EXAM: CT HEAD WITHOUT CONTRAST TECHNIQUE: Contiguous axial images were obtained from the base of the skull through the vertex without intravenous contrast. COMPARISON:  04/25/2015. FINDINGS: Brain: No definite area of acute infarction, no definite acute infarct, acute hemorrhage, mass lesion, mass effect or hydrocephalus. Atrophy. Extensive periventricular low attenuation. Remote lacunar infarcts in the basal ganglia bilaterally. Vascular: No hyperdense vessel or unexpected calcification. Skull: Normal. Negative for fracture or focal lesion. Sinuses/Orbits: No acute finding. Other: None. IMPRESSION: 1. No acute intracranial abnormality. 2. Atrophy and extensive chronic microvascular white matter ischemic changes. Electronically Signed   By: Leanna Battles M.D.   On: 01/29/2016 14:31   Dg Chest Portable 1 View  Result Date: 01-29-16 CLINICAL DATA:  Shortness of breath, weakness EXAM: PORTABLE CHEST 1 VIEW COMPARISON:  Chest x-ray of 11/20/2015 FINDINGS: There is now airspace disease in the right mid lung and at the left lung base suspicious for multifocal pneumonia. A small left effusion cannot be excluded. There is cardiomegaly present. The bones are osteopenic. Median sternotomy sutures are noted. IMPRESSION: Patchy lung opacities bilaterally most consistent with multifocal pneumonia. Electronically Signed   By: Dwyane Dee  M.D.   On: 02-09-16 13:42    Procedures .Critical Care Performed by: Shaune Pollack Authorized by: Shaune Pollack   Critical care provider statement:    Critical care time (minutes):  35   Critical care time was exclusive of:  Separately billable procedures and treating other patients   Critical care was necessary to treat or prevent imminent or life-threatening deterioration of the  following conditions:  Respiratory failure and sepsis   Critical care was time spent personally by me on the following activities:  Development of treatment plan with patient or surrogate, discussions with consultants, evaluation of patient's response to treatment, ordering and performing treatments and interventions, ordering and review of laboratory studies, ordering and review of radiographic studies, pulse oximetry, re-evaluation of patient's condition, review of old charts, examination of patient and obtaining history from patient or surrogate   I assumed direction of critical care for this patient from another provider in my specialty: no     (including critical care time)  Medications Ordered in ED Medications  vancomycin (VANCOCIN) 500 mg in sodium chloride 0.9 % 100 mL IVPB (not administered)  piperacillin-tazobactam (ZOSYN) IVPB 3.375 g (not administered)  sertraline (ZOLOFT) tablet 25 mg (not administered)  amiodarone (PACERONE) tablet 200 mg (not administered)  metoprolol tartrate (LOPRESSOR) tablet 25 mg (not administered)  atorvastatin (LIPITOR) tablet 40 mg (not administered)  tamsulosin (FLOMAX) capsule 0.4 mg (not administered)  memantine (NAMENDA) tablet 10 mg (not administered)  aspirin EC tablet 325 mg (not administered)  0.9 %  sodium chloride infusion (not administered)  enoxaparin (LOVENOX) injection 40 mg (not administered)  furosemide (LASIX) injection 20 mg (not administered)  sodium chloride 0.9 % bolus 1,000 mL (0 mLs Intravenous Stopped 2016-02-09 1438)  sodium chloride 0.9 % bolus 1,000 mL (0 mLs Intravenous Stopped 02-09-16 1438)  piperacillin-tazobactam (ZOSYN) IVPB 3.375 g (0 g Intravenous Stopped 02-09-2016 1329)  vancomycin (VANCOCIN) IVPB 1000 mg/200 mL premix (0 mg Intravenous Stopped 2016/02/09 1358)     Initial Impression / Assessment and Plan / ED Course  I have reviewed the triage vital signs and the nursing notes.  Pertinent labs & imaging results that  were available during my care of the patient were reviewed by me and considered in my medical decision making (see chart for details).  Clinical Course    82 year old female with past medical history as above who presents with respiratory distress and confusion on arrival temp 100.3 blood pressure heart rate normal but at rest or rate in the 30s with significantly increased work of breathing on exam. Hypoxic to 70s on RA. No home O2 requirement. Patient confused but able to intermittently answer questions. She is protecting her airway. Given tachypnea and reported cough with confusion, code sepsis initiated and will start broad-spectrum antibiotics. Chest x-ray confirms multifocal pneumonia. Will send broad labs and discuss goals of care with family.  CBC shows leukocytosis of 15,000. CMP shows acute on chronic hyponatremia and elevation of LFTs. No abdominal pain on my exam blood gas is acceptable. Patient has been given broad-spectrum antibiotics as well as IV fluids. Discuss goals of care with the family. They would desire BiPAP but patient is otherwise DO NOT RESUSCITATE/DO NOT RESUSCITATE. Will place on BiPAP for work of breathing and admit to the stepdown unit   Final Clinical Impressions(s) / ED Diagnoses   Final diagnoses:  Acute respiratory failure with hypoxia (HCC)  Sepsis, due to unspecified organism (HCC)  Leukocytosis, unspecified type    New Prescriptions New Prescriptions  No medications on file     Shaune Pollack, MD 02/09/2016 1736

## 2016-01-15 NOTE — H&P (Signed)
History and Physical    Jacqueline [name] OZH:[id number] DOB: 1928/11/08 DOA: 02/10/2016   PCP: Donato Schultz, DO   Patient coming from:  Home     Chief Complaint: Shortness of Breath   HPI: Jacqueline [name] is a 82 y.o. female with medical history significant for dementia, depression, HTN, HLD, severe deconditioning among other problems listed below,brought to the ED with severe shortness of breath and "wet cough" for the last 24 hrs. History is obtained by her family due to the patient's symptoms and dementia. No wheezing or  Hemoptysis noted. T max up to 100.3. No apparent  chest pain, palpitations,or  loss of consciousness. She has been very weak and lethargic over recent days, and this morning they noted increased confusion. Also, decreased urine retention was reported, last ouput last night.  At the time of visit, patient appears very uncomfortable gasping for air. Only sick contact 1 week ago at home.    ED Course:  BP (!) 137/107   Pulse (!) 54   Temp 100.3 F (37.9 C) (Rectal)   Resp 22   Ht 5\' 6"  (1.676 m)   Wt 47.6 kg (105 lb)   SpO2 96%   BMI 16.95 kg/m    Received IV 2 L fluid, IV ZOsyn and Vanco  UA is pending BCx is pending  Lactic acid 1.11 ABG: pH 7.434, pCO2 art 40.2, pO2 art 108, TCO2 28, Na 128, K 4, Cr 0.6, AGap 8, GLu 115, AST 279, ALT 289, T bil 1.3, WBC 15.3, Hb 10.7, PLt 263   Review of Systems: As per HPI otherwise 10 point review of systems negative.   Past Medical History:  Diagnosis Date  . Acute blood loss anemia   . Allergic rhinitis   . Anorexia   . Anxiety   . CAD (coronary artery disease)    a. 08/2012 Abnl MV;  b. 08/2012 CABGx5: LIMA->LAD, VG->Diag, VG->OM1->OM2, VG->RCA.  . Cerumen impaction   . Closed intertrochanteric fracture of left femur with routine healing   . Compression fracture of T12 vertebra (HCC)   . Constipation   . Dementia   . Depression   . Diverticulitis   . Dyslipidemia   . ESBL (extended spectrum  beta-lactamase) producing bacteria infection 04/2015   Urine  . Fracture of bone    left arm  . Generalized weakness   . GERD (gastroesophageal reflux disease)   . Glaucoma    bilateral  . Hearing loss of aging   . Hip fracture (HCC)    right  . HLD (hyperlipidemia)   . HLD (hyperlipidemia)   . Humerus fracture 11/2015  . Hypertensive heart disease   . Hyponatremia   . Insomnia   . Mild aortic stenosis    a. 08/2012 Echo: EF 60-65%, no rwma, mild AS, mildly dil LA, PASP .  . 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 Osteoporosis   . PAF (paroxysmal atrial fibrillation) (HCC)    a. Rhythm mgmt w/ amio;  b. CHA2DS2VASc = 5-->No anticoagulation 2/2 dementia/falls.  . Physical deconditioning   . Pressure ulcer   . Protein calorie malnutrition (HCC)   . Right knee pain   . Severe muscle deconditioning   . Unsteady gait   . Urinary retention   . UTI (lower urinary tract infection)     Past Surgical History:  Procedure Laterality Date  . CATARACT EXTRACTION, BILATERAL    . CORONARY ARTERY BYPASS GRAFT N/A 08/25/2012  Procedure: CORONARY ARTERY BYPASS GRAFTING ;  Surgeon: Alleen Borne, MD;  Location: [facility] OR;  Service: Open Heart Surgery;  Laterality: N/A;  four bypasses total   . FRACTURE SURGERY    . INTRAMEDULLARY (IM) NAIL INTERTROCHANTERIC Left 06/02/2015   Procedure: INTRAMEDULLARY (IM) NAIL INTERTROCHANTRIC;  Surgeon: Kathryne Hitch, MD;  Location: [facility] OR;  Service: Orthopedics;  Laterality: Left;  . LEFT HEART CATHETERIZATION WITH CORONARY ANGIOGRAM N/A 08/22/2012   Procedure: LEFT HEART CATHETERIZATION WITH CORONARY ANGIOGRAM;  Surgeon: Peter M Swaziland, MD;  Location: [facility] CATH LAB;  Service: Cardiovascular;  Laterality: N/A;  . PARTIAL COLECTOMY      Social History Social History   Social History  . Marital status: Widowed    Spouse name: N/A  . Number of children: 2  . Years of education: 13+   Occupational  History  . Retired    Social History Main Topics  . Smoking status: Former Smoker    Types: Cigarettes  . Smokeless tobacco: Never Used  . Alcohol use No     Comment: Occasional  . Drug use: No  . Sexual activity: No   Other Topics Concern  . Not on file   Social History Narrative   Regular exercise-no   Caffeine Use-yes     No Known Allergies  Family History  Problem Relation Age of Onset  . Cancer Mother     stomach  . Hypertension Other   . Diabetes Neg Hx   . Heart disease Neg Hx   . Stroke Neg Hx       Prior to Admission medications   Medication Sig Start Date End Date Taking? Authorizing Provider  alendronate (FOSAMAX) 70 MG tablet Take 1 tablet (70 mg total) by mouth once a week. Take with a full glass of water on an empty stomach on Tuesdays Patient taking differently: Take 70 mg by mouth every Saturday. Take with a full glass of water on an empty stomach 08/12/15  Yes [name], DO  amiodarone (PACERONE) 200 MG tablet Take 1 tablet (200 mg total) by mouth daily. 10/16/15  Yes [name], DO  aspirin EC 325 MG tablet Take 325 mg by mouth every evening.    Yes Historical Provider, MD  atorvastatin (LIPITOR) 40 MG tablet TAKE 1 TABLET BY MOUTH DAILY AT 6 PM Patient taking differently: Take 40 mg by mouth daily at 6 PM.  09/05/15  Yes [name], DO  Calcium Carbonate-Vitamin D3 (RA CALCIUM 600/VITAMIN D-3) 600-400 MG-UNIT TABS Take 0.5 tablets by mouth 2 (two) times daily.    Yes Historical Provider, MD  ferrous sulfate 325 (65 FE) MG tablet Take 325 mg by mouth 2 (two) times daily with a meal.    Yes Historical Provider, MD  folic acid (FOLVITE) 1 MG tablet TAKE 1 TABLET BY MOUTH EVERY DAY Patient taking differently: Take 1 mg by mouth every day 11/12/15  Yes Sheliah Hatch, MD  LUMIGAN 0.01 % SOLN Place 1 drop into both eyes at bedtime. 11/12/15  Yes Historical Provider, MD  memantine (NAMENDA) 10 MG tablet TAKE 1 TABLET (10 MG TOTAL)  BY MOUTH 2 (TWO) TIMES DAILY. 02/07/15  Yes Adam Mliss Fritz, DO  metoprolol tartrate (LOPRESSOR) 25 MG tablet Take 1 tablet (25 mg total) by mouth 2 (two) times daily. 12/13/15  Yes Lelon Perla Chase, DO  Multiple Vitamin (MULTIVITAMIN WITH MINERALS) TABS tablet Take 0.5 tablets by mouth 2 (two) times daily.  Yes Historical Provider, MD  Probiotic Product (ALIGN) 4 MG CAPS Take 4 mg by mouth daily.   Yes Historical Provider, MD  Protein POWD Take 1 scoop by mouth 2 (two) times daily.   Yes Historical Provider, MD  sertraline (ZOLOFT) 25 MG tablet TAKE 1 TABLET (25 MG TOTAL) BY MOUTH DAILY. 12/10/15  Yes [name], DO  simethicone (MYLICON) 125 MG chewable tablet Chew 125 mg by mouth daily.    Yes Historical Provider, MD  SODIUM CHLORIDE PO Take 1,000 mg by mouth daily.   Yes Historical Provider, MD  tamsulosin (FLOMAX) 0.4 MG CAPS capsule Take 0.4 mg by mouth daily after breakfast.   Yes Historical Provider, MD  acetaminophen (TYLENOL) 325 MG tablet Take 2 tablets (650 mg total) by mouth every 6 (six) hours as needed for mild pain or headache. Patient not taking: Reported on 11/20/2015 04/30/15   Zannie CovePreetha Joseph, MD  diazepam (VALIUM) 2 MG tablet Take 0.5 tablets (1 mg total) by mouth every 12 (twelve) hours as needed for anxiety. Patient not taking: Reported on 01/14/2016 06/06/15   Maretta BeesShanker M Ghimire, MD  docusate sodium (COLACE) 100 MG capsule Take 1 capsule (100 mg total) by mouth every 12 (twelve) hours. Patient not taking: Reported on 02/09/2016 11/20/15   Maretta BeesLouis Fornage, MD  HYDROcodone-acetaminophen (NORCO/VICODIN) 5-325 MG tablet Take 1 tablet by mouth every 6 (six) hours as needed for severe pain. 11/20/15   Maretta BeesLouis Fornage, MD  Lactobacillus ([employer] PROBIOTIC ACIDOPHILUS) 10 MG CAPS Take 1 capsule by mouth daily. Patient not taking: Reported on 02/02/2016 10/08/15   Donato SchultzYvonne R Lowne Chase, DO    Physical Exam:    Vitals:   02/06/2016 1439 01/29/2016 1445 01/24/2016 1506 01/13/2016 1533  BP:  (!)  165/101 180/77 (!) 137/107  Pulse: (!) 54 (!) 55 (!) 55 (!) 54  Resp: (!) 28 22 20 22   Temp:      TempSrc:      SpO2: 100% 100% 100% 96%  Weight:      Height:           Constitutional:in distress, gasping for air, very lethargic but uncomfortable apperaing Wearing NRB  Vitals:   02/05/2016 1439 01/14/2016 1445 02/08/2016 1506 02/08/2016 1533  BP:  (!) 165/101 180/77 (!) 137/107  Pulse: (!) 54 (!) 55 (!) 55 (!) 54  Resp: (!) 28 22 20 22   Temp:      TempSrc:      SpO2: 100% 100% 100% 96%  Weight:      Height:       Eyes: PERRL, lids and conjunctivae normal ENMT: Mucous membranes are moist.  Neck: normal, supple, no masses, no thyromegaly Respiratory:anteriorly,  Several areas of decreased breath sounds with increased rales at the anterior bases. No wheezing or rhonchi  Increased respiratory effort. No accessory muscle use.  Cardiovascular:  Regular rate and rhythm, no murmurs / rubs / gallops. No extremity edema. 2+ pedal pulses. No carotid bruits.  Abdomen: no apparent tenderness, no masses palpated. No hepatosplenomegaly. Bowel sounds positive.  Musculoskeletal: no clubbing / cyanosis. No joint deformity upper and lower extremities. Good ROM, no contractures. Normal muscle tone.  Skin: no rashes, lesions, healing Left elbow pressure ulcer  Neurologic: unable to assess, patient unable to follow commands due to dementia, and current lethargic status   Labs on Admission: I have personally reviewed following labs and imaging studies  CBC:  Recent Labs Lab 01/26/2016 1300  WBC 15.3*  NEUTROABS 14.3*  HGB 10.7*  HCT 32.5*  MCV 100.3*  PLT 263    Basic Metabolic Panel:  Recent Labs Lab 2016-01-30 1300  NA 128*  K 4.0  CL 98*  CO2 22  GLUCOSE 115*  BUN 21*  CREATININE 0.60  CALCIUM 8.3*    GFR: Estimated Creatinine Clearance: 38.6 mL/min (by C-G formula based on SCr of 0.6 mg/dL).  Liver Function Tests:  Recent Labs Lab 2016/01/30 1300  AST 279*  ALT 283*  ALKPHOS  154*  BILITOT 1.3*  PROT 5.6*  ALBUMIN 2.4*   No results for input(s): LIPASE, AMYLASE in the last 168 hours. No results for input(s): AMMONIA in the last 168 hours.  Coagulation Profile: No results for input(s): INR, PROTIME in the last 168 hours.  Cardiac Enzymes: No results for input(s): CKTOTAL, CKMB, CKMBINDEX, TROPONINI in the last 168 hours.  BNP (last 3 results) No results for input(s): PROBNP in the last 8760 hours.  HbA1C: No results for input(s): HGBA1C in the last 72 hours.  CBG: No results for input(s): GLUCAP in the last 168 hours.  Lipid Profile: No results for input(s): CHOL, HDL, LDLCALC, TRIG, CHOLHDL, LDLDIRECT in the last 72 hours.  Thyroid Function Tests: No results for input(s): TSH, T4TOTAL, FREET4, T3FREE, THYROIDAB in the last 72 hours.  Anemia Panel: No results for input(s): VITAMINB12, FOLATE, FERRITIN, TIBC, IRON, RETICCTPCT in the last 72 hours.  Urine analysis:    Component Value Date/Time   COLORURINE YELLOW 07/22/2015 1353   APPEARANCEUR CLEAR 07/22/2015 1353   LABSPEC 1.009 07/22/2015 1353   PHURINE 7.0 07/22/2015 1353   GLUCOSEU NEGATIVE 07/22/2015 1353   HGBUR NEGATIVE 07/22/2015 1353   BILIRUBINUR Neg 09/26/2015 1548   KETONESUR NEGATIVE 07/22/2015 1353   PROTEINUR Neg 09/26/2015 1548   PROTEINUR NEGATIVE 07/22/2015 1353   UROBILINOGEN 2.0 09/26/2015 1548   UROBILINOGEN 1.0 01/15/2015 2221   NITRITE Neg 09/26/2015 1548   NITRITE NEGATIVE 07/22/2015 1353   LEUKOCYTESUR large (3+) (A) 09/26/2015 1548    Sepsis Labs: @LABRCNTIP (procalcitonin:4,lacticidven:4) )No results found for this or any previous visit (from the past 240 hour(s)).   Radiological Exams on Admission: Ct Head Wo Contrast  Result Date: 30-Jan-2016 CLINICAL DATA:  Weakness and left-sided facial droop. EXAM: CT HEAD WITHOUT CONTRAST TECHNIQUE: Contiguous axial images were obtained from the base of the skull through the vertex without intravenous contrast.  COMPARISON:  04/25/2015. FINDINGS: Brain: No definite area of acute infarction, no definite acute infarct, acute hemorrhage, mass lesion, mass effect or hydrocephalus. Atrophy. Extensive periventricular low attenuation. Remote lacunar infarcts in the basal ganglia bilaterally. Vascular: No hyperdense vessel or unexpected calcification. Skull: Normal. Negative for fracture or focal lesion. Sinuses/Orbits: No acute finding. Other: None. IMPRESSION: 1. No acute intracranial abnormality. 2. Atrophy and extensive chronic microvascular white matter ischemic changes. Electronically Signed   By: Leanna Battles M.D.   On: January 30, 2016 14:31   Dg Chest Portable 1 View  Result Date: 2016-01-30 CLINICAL DATA:  Shortness of breath, weakness EXAM: PORTABLE CHEST 1 VIEW COMPARISON:  Chest x-ray of 11/20/2015 FINDINGS: There is now airspace disease in the right mid lung and at the left lung base suspicious for multifocal pneumonia. A small left effusion cannot be excluded. There is cardiomegaly present. The bones are osteopenic. Median sternotomy sutures are noted. IMPRESSION: Patchy lung opacities bilaterally most consistent with multifocal pneumonia. Electronically Signed   By: Dwyane Dee M.D.   On: 30-Jan-2016 13:42    EKG: Independently reviewed.  Assessment/Plan Active Problems:   Multifocal pneumonia  Sepsis (HCC)   Respiratory distress, acute   HTN (hypertension)   Hearing loss of aging   Severe muscle deconditioning   Pressure ulcer   Generalized weakness   Anorexia   Generalized anxiety disorder   Sepsis likely due to urine versus  respiratory source (multifocal pneumonia per CXR) organism unknown Patient meets criteria given  tachypnea, fever, leukocytosis, and evidence of  Multiorgan dysfunction.  Antibiotics delivered in the ED with Zosyn and Vanco . UA pending, Initial Lactic acid 1.11 . Na 128 (likely at baseline , patient takes salt tablets at home)   AST 279, ALT 289, T bil 1.3,, T max  100.4     patient's ABG notable for pH 7.434, pCO2 art 40.2, pO2 art 108, TCO2 28, EKG Sinus rythm  .Patient is on NRB. Received 2 L NS to date, will continue at 75 cc Admit to SDU Sepsis order set  Droplet precaution IV antibiotics by pharmacy with Aztreonam and Vanco DuoNebs Follow lactic acid q 6 hrs Follow blood and urine cultures IV fluids at 100 cc/h.  Procalcitonin order set  UA, cultures Check Tn   BiPAP wean trial is done around  8 pm. If not successful , will readdress code status    Urine retention, acute on chronic , last output last night per family report, unable to take her Flomax today due to current lethargic status.  No I/O was able to be obtained due to above.  Urine catheter STAT Lasix 20 mg IV x1 for diuresis     Hypertension BP (!) 137/107   Pulse (!) 54   Temp 100.3 F (37.9 C) (Rectal)   Resp 22   Ht 5\' 6"  (1.676 m)   Wt 47.6 kg (105 lb)   SpO2 96%   BMI 16.95 kg/m  Continue  anti-hypertensive medications when more alert, able to swallow. Will try meds when BiPAP wean trial is done around  8 pm  Add Hydralazine Q6 hours as needed for BP 160/90   Atrial Fibrillation currently on SR , on amiodarone  Rate controlled -Continue meds when able to be more alert   Depression Continue Zoloft tomorrow   DVT prophylaxis: none. Lovenox  Code Status:    DNR Family Communication:  Discussed with patient family   Disposition Plan: Expect patient to be discharged to home if condition improves Consults called:    None Admission status: SDU    [facility], PA-C [facility]   If 7PM-7AM, please contact night-coverage [email or web address] Password [email or web address]  02/03/2016, 3:41 PM

## 2016-01-15 NOTE — ED Notes (Signed)
Daisy Blossom PA at bedside

## 2016-01-15 NOTE — ED Notes (Signed)
Pt transported to CT with this RN 

## 2016-01-15 NOTE — Progress Notes (Signed)
Pharmacy Antibiotic Note  Jacqueline [name] is a 82 y.o. female admitted on 01/27/2016 with sepsis.  Pharmacy has been consulted for vancomycin and zosyn dosing. Tmax is 100.3, WBC is elevated at 15.3 and SCr is WNL. Lactic acid is also WNL.   Plan: - Vancomycin 1gm IV x 1 then 500mg  IV Q24H - Zosyn 3.375gm IV Q8H (4 hr inf) - F/u renal fxn, C&S, clinical status and trough at SS  Height: 5\' 6"  (167.6 cm) Weight: 105 lb (47.6 kg) IBW/kg (Calculated) : 59.3  Temp (24hrs), Avg:100.4 F (38 C), Min:100.3 F (37.9 C), Max:100.4 F (38 C)   Recent Labs Lab 02/05/2016 1300 01/18/2016 1311  WBC 15.3*  --   CREATININE 0.60  --   LATICACIDVEN  --  1.11    Estimated Creatinine Clearance: 38.6 mL/min (by C-G formula based on SCr of 0.6 mg/dL).    No Known Allergies  Antimicrobials this admission: Vanc 10/4>> Zosyn 10/4>>  Dose adjustments this admission: N/A  Microbiology results: Pending  Thank you for allowing pharmacy to be a part of this patient's care.  Lilton Pare, Drake LeachRachel Lynn 02/09/2016 2:15 PM

## 2016-01-15 NOTE — ED Notes (Signed)
Pt was attempting to remove the BiPap mask, this RN replaced the mask. Oxygen saturation improved.

## 2016-01-15 NOTE — ED Notes (Signed)
Attempting to page Dr Konrad DoloresMerrell.

## 2016-01-15 NOTE — ED Triage Notes (Signed)
Per [facility]: The pt started having left sided weakness, facial drooping, and slurred speech last night. The pts symptoms were worse and more notable around 0200. The pts home health worker came in today and suggested that the family call EMS. Per [facility] the pt felt warm to the touch. The pt does have a sore on her left elbow, inspection here revealed exposed hard wear. The pts left arm is swollen. The pt was 85% on room air, EMS placed the pt on 12-15 L NRB, and her oxygen saturation increased to 100%.

## 2016-01-15 NOTE — ED Notes (Signed)
Admitting MD at bedside.

## 2016-01-16 ENCOUNTER — Inpatient Hospital Stay (HOSPITAL_COMMUNITY): Payer: Medicare HMO

## 2016-01-16 DIAGNOSIS — I420 Dilated cardiomyopathy: Secondary | ICD-10-CM

## 2016-01-16 DIAGNOSIS — I480 Paroxysmal atrial fibrillation: Secondary | ICD-10-CM

## 2016-01-16 DIAGNOSIS — J9601 Acute respiratory failure with hypoxia: Secondary | ICD-10-CM

## 2016-01-16 DIAGNOSIS — R001 Bradycardia, unspecified: Secondary | ICD-10-CM

## 2016-01-16 DIAGNOSIS — A419 Sepsis, unspecified organism: Principal | ICD-10-CM

## 2016-01-16 DIAGNOSIS — I350 Nonrheumatic aortic (valve) stenosis: Secondary | ICD-10-CM

## 2016-01-16 DIAGNOSIS — R0603 Acute respiratory distress: Secondary | ICD-10-CM

## 2016-01-16 DIAGNOSIS — J189 Pneumonia, unspecified organism: Secondary | ICD-10-CM

## 2016-01-16 DIAGNOSIS — I10 Essential (primary) hypertension: Secondary | ICD-10-CM

## 2016-01-16 LAB — COMPREHENSIVE METABOLIC PANEL
ALT: 281 U/L — ABNORMAL HIGH (ref 14–54)
ANION GAP: 14 (ref 5–15)
AST: 286 U/L — AB (ref 15–41)
Albumin: 2.4 g/dL — ABNORMAL LOW (ref 3.5–5.0)
Alkaline Phosphatase: 143 U/L — ABNORMAL HIGH (ref 38–126)
BILIRUBIN TOTAL: 1.6 mg/dL — AB (ref 0.3–1.2)
BUN: 16 mg/dL (ref 6–20)
CALCIUM: 7.9 mg/dL — AB (ref 8.9–10.3)
CHLORIDE: 97 mmol/L — AB (ref 101–111)
CO2: 22 mmol/L (ref 22–32)
Creatinine, Ser: 0.56 mg/dL (ref 0.44–1.00)
Glucose, Bld: 90 mg/dL (ref 65–99)
POTASSIUM: 3.2 mmol/L — AB (ref 3.5–5.1)
Sodium: 133 mmol/L — ABNORMAL LOW (ref 135–145)
TOTAL PROTEIN: 5.6 g/dL — AB (ref 6.5–8.1)

## 2016-01-16 LAB — CBC
HCT: 34.5 % — ABNORMAL LOW (ref 36.0–46.0)
HEMOGLOBIN: 11.3 g/dL — AB (ref 12.0–15.0)
MCH: 33.1 pg (ref 26.0–34.0)
MCHC: 32.8 g/dL (ref 30.0–36.0)
MCV: 101.2 fL — ABNORMAL HIGH (ref 78.0–100.0)
Platelets: 277 10*3/uL (ref 150–400)
RBC: 3.41 MIL/uL — AB (ref 3.87–5.11)
RDW: 15.5 % (ref 11.5–15.5)
WBC: 17.7 10*3/uL — ABNORMAL HIGH (ref 4.0–10.5)

## 2016-01-16 LAB — URINALYSIS, ROUTINE W REFLEX MICROSCOPIC
BILIRUBIN URINE: NEGATIVE
Glucose, UA: NEGATIVE mg/dL
Hgb urine dipstick: NEGATIVE
Ketones, ur: NEGATIVE mg/dL
LEUKOCYTES UA: NEGATIVE
NITRITE: NEGATIVE
Protein, ur: NEGATIVE mg/dL
SPECIFIC GRAVITY, URINE: 1.016 (ref 1.005–1.030)
pH: 6 (ref 5.0–8.0)

## 2016-01-16 LAB — ECHOCARDIOGRAM COMPLETE
HEIGHTINCHES: 62 in
WEIGHTICAEL: 1777.6 [oz_av]

## 2016-01-16 LAB — STREP PNEUMONIAE URINARY ANTIGEN: STREP PNEUMO URINARY ANTIGEN: NEGATIVE

## 2016-01-16 MED ORDER — IPRATROPIUM-ALBUTEROL 0.5-2.5 (3) MG/3ML IN SOLN
3.0000 mL | Freq: Four times a day (QID) | RESPIRATORY_TRACT | Status: DC
  Administered 2016-01-16 – 2016-01-19 (×13): 3 mL via RESPIRATORY_TRACT
  Filled 2016-01-16 (×13): qty 3

## 2016-01-16 MED ORDER — HYDRALAZINE HCL 20 MG/ML IJ SOLN
5.0000 mg | Freq: Four times a day (QID) | INTRAMUSCULAR | Status: DC
  Administered 2016-01-16 – 2016-01-17 (×2): 5 mg via INTRAVENOUS
  Filled 2016-01-16 (×2): qty 1

## 2016-01-16 MED ORDER — POTASSIUM CHLORIDE 10 MEQ/100ML IV SOLN
INTRAVENOUS | Status: AC
  Administered 2016-01-16: 10 meq via INTRAVENOUS
  Filled 2016-01-16: qty 100

## 2016-01-16 MED ORDER — METHYLPREDNISOLONE SODIUM SUCC 125 MG IJ SOLR
60.0000 mg | INTRAMUSCULAR | Status: DC
  Administered 2016-01-16 – 2016-01-18 (×3): 60 mg via INTRAVENOUS
  Filled 2016-01-16 (×3): qty 2

## 2016-01-16 MED ORDER — POTASSIUM CHLORIDE 10 MEQ/100ML IV SOLN
10.0000 meq | INTRAVENOUS | Status: AC
  Administered 2016-01-16 (×5): 10 meq via INTRAVENOUS
  Filled 2016-01-16 (×4): qty 100

## 2016-01-16 MED ORDER — MORPHINE SULFATE (PF) 2 MG/ML IV SOLN
0.5000 mg | INTRAVENOUS | Status: DC
  Administered 2016-01-16 – 2016-01-17 (×5): 0.5 mg via INTRAVENOUS
  Filled 2016-01-16 (×5): qty 1

## 2016-01-16 NOTE — Care Management Note (Signed)
Case Management Note  Patient Details  Name: Jacqueline LimesJacqueline A Likins MRN: [id number] Date of Birth: Mar 07, 1929  Subjective/Objective:   Pt lives with son and dtr-in-law, is active with [facility].                               Expected Discharge Plan:  Home w Home Health Services  Discharge planning Services  CM Consult  Post Acute Care Choice:  Resumption of Svcs/PTA Provider  [facility]  Deslyn Cavenaugh, [place]aer[place]ri[place] T, RN 01/16/2016, 1:40 PM

## 2016-01-16 NOTE — Care Management Important Message (Signed)
Important Message  Patient Details  Name: Jacqueline [name] MRN: [id number] Date of Birth: Aug 27, 1928   Medicare Important Message Given:  Yes    Burk Hoctor 01/16/2016, 11:58 AM

## 2016-01-16 NOTE — Care Management Important Message (Signed)
Important Message  Patient Details  Name: Jacqueline [name] MRN: [id number] Date of Birth: [birth year]   Medicare Important Message Given:  Yes    [name] 01/16/2016, 11:59 AM   Pls disregard previous note

## 2016-01-16 NOTE — Progress Notes (Signed)
SLP Cancellation Note  Patient Details Name: Jacqueline [name] MRN: [id number] DOB: June 29, 1928   Cancelled treatment:       Reason Eval/Treat Not Completed: Medical issues which prohibited therapy (Patient on Bipap. Will f/u this pm. )  Ferdinand LangoLeah Jamiya Nims MA, CCC-SLP [phone]   Jacqueline [name] 01/16/2016, 8:54 AM

## 2016-01-16 NOTE — Progress Notes (Signed)
Upon initial skin assessment, a large open ulcer on the left elbow about 2 inches in diameter was found. There is exposed metal from hardwear visible. The site is hot and painful, causing the patient to flinch when touched. LUE has non-pitting edema. This RN placed a foam dressing and wrapped elbow with kerlex.   Pt tolerating BiPAP. Will continue to monitor.  Alba DestineMisty L Ennis, RN, BSN

## 2016-01-16 NOTE — Progress Notes (Signed)
  Echocardiogram 2D Echocardiogram has been performed.  Janalyn HarderWest, Wayman Hoard R 01/16/2016, 3:18 PM

## 2016-01-16 NOTE — Progress Notes (Signed)
PROGRESS NOTE    Jacqueline [name]  ZOX:[id number] DOB: [birth year] DOA: 01/29/2016 PCP: Donato SchultzYvonne R Lowne Chase, DO   Brief Narrative:  82 y.o. WF PMHx Dementia,Anxiety, Depression, HTN,PAF (paroxysmal atrial fibrillation), HLD, Anorexia, ESBL UTI ,Urinary retention, Chronic Hyponatremia, Compression fracture of T12 vertebra   Brought to the ED with severe shortness of breath and "wet cough" for the last 24 hrs. History is obtained by her family due to the patient's symptoms and dementia. No wheezing or  Hemoptysis noted. T max up to 100.3. No apparent  chest pain, palpitations,or  loss of consciousness. She has been very weak and lethargic over recent days, and this morning they noted increased confusion. Also, decreased urine retention was reported, last ouput last night.  At the time of visit, patient appears very uncomfortable gasping for air. Only sick contact 1 week ago at home.     Subjective: 10/5 patient opens eyes briefly to stimulation however babbles incoherently. As soon as BiPAP mask is removed patient desats into the mid 80s.   Assessment & Plan:   Active Problems:   HTN (hypertension)   Hearing loss of aging   Severe muscle deconditioning   Pressure ulcer   Generalized weakness   Anorexia   Generalized anxiety disorder   Multifocal pneumonia   Sepsis (HCC)   Respiratory distress, acute   Acute respiratory failure with hypoxia (HCC)   Bradycardia   Dilated cardiomyopathy (HCC)   Nonrheumatic aortic valve stenosis   Sepsis; UTI vs (multifocal pneumonia per CXR) organism unknown  -Patient meets criteria given  tachypnea, fever, leukocytosis, and evidence of  Multiorgan dysfunction.   --Continue current antibiotics complete 5 day course. -DuoNeb QID -Morphine 0.5 mg for BiPAP,feeling of air hunger, abdominal pain.  -Solu-Medrol 60 mg daily -Normal saline 2850ml/hr -Lactic acidosis resolved  Urine retention, acute on chronic , -Foley catheter  Cholecystitis  vs Cholangitis vs Choledocholithiasis   -Patient with significant increase in liver enzymes and T bili since April 2017, plus fever, plus RUQ abdominal pain.  -Abdominal ultrasound: Shows stones but no ductal dilation, if patient's enzymes do not begin to correct will consult GI. When stable obtain CT abdomen.  Hypertension  -Hydralazine 5 mg QID  Atrial Fibrillation  -Currently in NSR, Amiodarone on hold secondary to requiring BiPAP  - Rate controlled -Continue meds when able to be more alert   Dilated Cardiomyopathy/Moderate Aortic Stenosis -Echocardiogram February 2017 consistent with dilated cardiomyopathy in a stenosis. -Echocardiogram pending -Strict in and out -Daily weight  Bradycardia -Improved but borderline would hold all nodal blocking agents  Depression Continue Zoloft tomorrow if cognition improves  Hypokalemia -Potassium goal> 4 -Potassium IV 50 mEq   DVT prophylaxis: Lovenox Code Status: DO NOT RESUSCITATE Family Communication: Left message on Jacqueline [name] cell phone. Disposition Plan: SNF?   Consultants:  None   Procedures/Significant Events:  10/4 PCXR:Patchy lung opacities bilaterally most consistent with multifocal pneumonia., 10/4 CT head WO contrast:-Negative acute infarct.-Remote lacunar infarcts in the basal ganglia bilaterally. 10/5 RUQ Abdominal ultrasound:- several gallstones, largest measuring 1.2 cm, no wall thickening. No pericholecystic fluid.    Cultures 10/4 blood left hand/right AC NGTD 10/4 MRSA by PCR negative 10/5 urine pending   Antimicrobials: Zosyn 10/4>> Vancomycin 10/4>>   Devices None   LINES / TUBES:  None    Continuous Infusions: . sodium chloride 50 mL/hr at 01/17/2016 1735     Objective: Vitals:   01/16/16 1332 01/16/16 1357 01/16/16 1607 01/16/16 1640  BP: (!) 147/72  Marland Kitchen(!)  150/71 (!) 150/71  Pulse: 66  71 72  Resp: (!) 33  (!) 28 (!) 23  Temp: 97.8 F (36.6 C)   98.4 F (36.9 C)    TempSrc: Axillary   Axillary  SpO2: (!) 89% 94% 94% 92%  Weight:      Height:        Intake/Output Summary (Last 24 hours) at 01/16/16 1726 Last data filed at 01/16/16 1548  Gross per 24 hour  Intake          1360.83 ml  Output              845 ml  Net           515.83 ml   Filed Weights   01/24/2016 1320 01/16/2016 2000  Weight: 47.6 kg (105 lb) 50.4 kg (111 lb 1.6 oz)    Examination:  General: patient opens eyes briefly to stimulation, however babbles incoherently.cachectic, positive acute respiratory distress Eyes: negative scleral hemorrhage, negative anisocoria, negative icterus ENT: Negative Runny nose, negative gingival bleeding, Neck:  Negative scars, masses, torticollis, lymphadenopathy, JVD Lungs: Decreased breath sounds diffusely, positive mild expiratory wheezing, negative  crackles Cardiovascular: Regular rate and rhythm without murmur gallop or rub normal S1 and S2 Abdomen: Positive abdominal pain to palpation especially RUQ, nondistended, positive soft, bowel sounds, no rebound, no ascites, no appreciable mass Extremities: No significant cyanosis, clubbing, or edema bilateral lower extremities Skin: Negative rashes, lesions, ulcers Psychiatric:  Unable to evaluate  Central nervous system:  Unable to evaluate  .     Data Reviewed: Care during the described time interval was provided by me .  I have reviewed this patient's available data, including medical history, events of note, physical examination, and all test results as part of my evaluation. I have personally reviewed and interpreted all radiology studies.  CBC:  Recent Labs Lab 02/05/2016 1300 01/28/2016 1800 01/16/16 0325  WBC 15.3* 14.9* 17.7*  NEUTROABS 14.3* 13.7*  --   HGB 10.7* 11.1* 11.3*  HCT 32.5* 33.6* 34.5*  MCV 100.3* 100.6* 101.2*  PLT 263 259 277   Basic Metabolic Panel:  Recent Labs Lab 02/07/2016 1300 02/04/2016 1800 01/16/16 0325  NA 128* 131* 133*  K 4.0 3.5 3.2*  CL 98* 98*  97*  CO2 22 24 22   GLUCOSE 115* 120* 90  BUN 21* 18 16  CREATININE 0.60 0.63 0.56  CALCIUM 8.3* 8.0* 7.9*   GFR: Estimated Creatinine Clearance: 40.7 mL/min (by C-G formula based on SCr of 0.56 mg/dL). Liver Function Tests:  Recent Labs Lab 01/18/2016 1300 01/28/2016 1800 01/16/16 0325  AST 279* 286* 286*  ALT 283* 285* 281*  ALKPHOS 154* 147* 143*  BILITOT 1.3* 1.2 1.6*  PROT 5.6* 5.3* 5.6*  ALBUMIN 2.4* 2.4* 2.4*   No results for input(s): LIPASE, AMYLASE in the last 168 hours. No results for input(s): AMMONIA in the last 168 hours. Coagulation Profile:  Recent Labs Lab 02/02/2016 1800  INR 1.42   Cardiac Enzymes: No results for input(s): CKTOTAL, CKMB, CKMBINDEX, TROPONINI in the last 168 hours. BNP (last 3 results) No results for input(s): PROBNP in the last 8760 hours. HbA1C: No results for input(s): HGBA1C in the last 72 hours. CBG: No results for input(s): GLUCAP in the last 168 hours. Lipid Profile: No results for input(s): CHOL, HDL, LDLCALC, TRIG, CHOLHDL, LDLDIRECT in the last 72 hours. Thyroid Function Tests: No results for input(s): TSH, T4TOTAL, FREET4, T3FREE, THYROIDAB in the last 72 hours. Anemia Panel: No results for  input(s): VITAMINB12, FOLATE, FERRITIN, TIBC, IRON, RETICCTPCT in the last 72 hours. Urine analysis:    Component Value Date/Time   COLORURINE YELLOW 01/16/2016 0105   APPEARANCEUR CLEAR 01/16/2016 0105   LABSPEC 1.016 01/16/2016 0105   PHURINE 6.0 01/16/2016 0105   GLUCOSEU NEGATIVE 01/16/2016 0105   HGBUR NEGATIVE 01/16/2016 0105   BILIRUBINUR NEGATIVE 01/16/2016 0105   BILIRUBINUR Neg 09/26/2015 1548   KETONESUR NEGATIVE 01/16/2016 0105   PROTEINUR NEGATIVE 01/16/2016 0105   UROBILINOGEN 2.0 09/26/2015 1548   UROBILINOGEN 1.0 01/15/2015 2221   NITRITE NEGATIVE 01/16/2016 0105   LEUKOCYTESUR NEGATIVE 01/16/2016 0105   Sepsis Labs: @LABRCNTIP (procalcitonin:4,lacticidven:4)  ) Recent Results (from the past 240 hour(s))   Blood Culture (routine x 2)     Status: None (Preliminary result)   Collection Time: 02/11/2016 12:55 PM  Result Value Ref Range Status   Specimen Description BLOOD LEFT HAND  Final   Special Requests BOTTLES DRAWN AEROBIC AND ANAEROBIC 5CC  Final   Culture NO GROWTH 1 DAY  Final   Report Status PENDING  Incomplete  Blood Culture (routine x 2)     Status: None (Preliminary result)   Collection Time: 01/25/2016  1:00 PM  Result Value Ref Range Status   Specimen Description BLOOD RIGHT ANTECUBITAL  Final   Special Requests BOTTLES DRAWN AEROBIC ONLY 5CC  Final   Culture NO GROWTH 1 DAY  Final   Report Status PENDING  Incomplete  MRSA PCR Screening     Status: None   Collection Time: 02/06/2016  9:17 PM  Result Value Ref Range Status   MRSA by PCR NEGATIVE NEGATIVE Final    Comment:        The GeneXpert MRSA Assay (FDA approved for NASAL specimens only), is one component of a comprehensive MRSA colonization surveillance program. It is not intended to diagnose MRSA infection nor to guide or monitor treatment for MRSA infections.          Radiology Studies: Ct Head Wo Contrast  Result Date: 02/02/2016 CLINICAL DATA:  Weakness and left-sided facial droop. EXAM: CT HEAD WITHOUT CONTRAST TECHNIQUE: Contiguous axial images were obtained from the base of the skull through the vertex without intravenous contrast. COMPARISON:  04/25/2015. FINDINGS: Brain: No definite area of acute infarction, no definite acute infarct, acute hemorrhage, mass lesion, mass effect or hydrocephalus. Atrophy. Extensive periventricular low attenuation. Remote lacunar infarcts in the basal ganglia bilaterally. Vascular: No hyperdense vessel or unexpected calcification. Skull: Normal. Negative for fracture or focal lesion. Sinuses/Orbits: No acute finding. Other: None. IMPRESSION: 1. No acute intracranial abnormality. 2. Atrophy and extensive chronic microvascular white matter ischemic changes. Electronically Signed    By: Leanna Battles M.D.   On: 02/06/2016 14:31   Dg Chest Portable 1 View  Result Date: 01/24/2016 CLINICAL DATA:  Shortness of breath, weakness EXAM: PORTABLE CHEST 1 VIEW COMPARISON:  Chest x-ray of 11/20/2015 FINDINGS: There is now airspace disease in the right mid lung and at the left lung base suspicious for multifocal pneumonia. A small left effusion cannot be excluded. There is cardiomegaly present. The bones are osteopenic. Median sternotomy sutures are noted. IMPRESSION: Patchy lung opacities bilaterally most consistent with multifocal pneumonia. Electronically Signed   By: Dwyane Dee M.D.   On: 02/02/2016 13:42   US Abdomen Limited Ruq  Result Date: 01/16/2016 CLINICAL DATA:  Elevated liver function tests.  Elevated bilirubin. EXAM: US ABDOMEN LIMITED - RIGHT UPPER QUADRANT COMPARISON:  CT, 04/25/2015. FINDINGS: Gallbladder: There several gallstones, largest measuring 1.2 cm.  There is no wall thickening. No pericholecystic fluid. Common bile duct: Diameter: 2.4 mm Liver: No focal lesion identified. Within normal limits in parenchymal echogenicity. IMPRESSION: 1. Gallstones without evidence of acute cholecystitis. 2. No other abnormality.  No bile duct dilation. Electronically Signed   By: Amie Portland M.D.   On: 01/16/2016 16:12        Scheduled Meds: . amiodarone  200 mg Oral Daily  . aspirin EC  325 mg Oral QPM  . atorvastatin  40 mg Oral q1800  . enoxaparin (LOVENOX) injection  40 mg Subcutaneous Q24H  . hydrALAZINE  5 mg Intravenous Q6H  . ipratropium-albuterol  3 mL Nebulization Q6H  . memantine  10 mg Oral BID  . methylPREDNISolone (SOLU-MEDROL) injection  60 mg Intravenous Q24H  . metoprolol tartrate  25 mg Oral BID  .  morphine injection  0.5 mg Intravenous Q3H  . piperacillin-tazobactam (ZOSYN)  IV  3.375 g Intravenous Q8H  . potassium chloride      . sertraline  25 mg Oral Daily  . tamsulosin  0.4 mg Oral QPC breakfast  . vancomycin  500 mg Intravenous Q24H    Continuous Infusions: . sodium chloride 50 mL/hr at 02-14-2016 1735     LOS: 1 day    Time spent: 40 minutes    Lehman Whiteley, Roselind Messier, MD [facility] Pager [phone]   If 7PM-7AM, please contact night-coverage [email or web address] Password [email or web address] 01/16/2016, 5:26 PM

## 2016-01-17 ENCOUNTER — Inpatient Hospital Stay (HOSPITAL_COMMUNITY): Payer: Medicare HMO

## 2016-01-17 DIAGNOSIS — J80 Acute respiratory distress syndrome: Secondary | ICD-10-CM

## 2016-01-17 LAB — LEGIONELLA PNEUMOPHILA SEROGP 1 UR AG: L. PNEUMOPHILA SEROGP 1 UR AG: NEGATIVE

## 2016-01-17 LAB — CBC WITH DIFFERENTIAL/PLATELET
Basophils Absolute: 0 10*3/uL (ref 0.0–0.1)
Basophils Relative: 0 %
Eosinophils Absolute: 0 10*3/uL (ref 0.0–0.7)
Eosinophils Relative: 0 %
HCT: 33.9 % — ABNORMAL LOW (ref 36.0–46.0)
Hemoglobin: 11 g/dL — ABNORMAL LOW (ref 12.0–15.0)
Lymphocytes Relative: 1 %
Lymphs Abs: 0.2 10*3/uL — ABNORMAL LOW (ref 0.7–4.0)
MCH: 33.4 pg (ref 26.0–34.0)
MCHC: 32.4 g/dL (ref 30.0–36.0)
MCV: 103 fL — ABNORMAL HIGH (ref 78.0–100.0)
Monocytes Absolute: 0.7 10*3/uL (ref 0.1–1.0)
Monocytes Relative: 4 %
Neutro Abs: 18.8 10*3/uL — ABNORMAL HIGH (ref 1.7–7.7)
Neutrophils Relative %: 95 %
Platelets: 275 10*3/uL (ref 150–400)
RBC: 3.29 MIL/uL — ABNORMAL LOW (ref 3.87–5.11)
RDW: 16 % — ABNORMAL HIGH (ref 11.5–15.5)
WBC: 19.7 10*3/uL — ABNORMAL HIGH (ref 4.0–10.5)

## 2016-01-17 LAB — COMPREHENSIVE METABOLIC PANEL
ALT: 258 U/L — ABNORMAL HIGH (ref 14–54)
AST: 251 U/L — ABNORMAL HIGH (ref 15–41)
Albumin: 2.3 g/dL — ABNORMAL LOW (ref 3.5–5.0)
Alkaline Phosphatase: 113 U/L (ref 38–126)
Anion gap: 13 (ref 5–15)
BUN: 23 mg/dL — ABNORMAL HIGH (ref 6–20)
CO2: 19 mmol/L — ABNORMAL LOW (ref 22–32)
Calcium: 7.9 mg/dL — ABNORMAL LOW (ref 8.9–10.3)
Chloride: 102 mmol/L (ref 101–111)
Creatinine, Ser: 0.82 mg/dL (ref 0.44–1.00)
GFR calc Af Amer: >60 mL/min (ref >60)
GFR calc non Af Amer: >60 mL/min (ref >60)
Glucose, Bld: 126 mg/dL — ABNORMAL HIGH (ref 65–99)
Potassium: 4.1 mmol/L (ref 3.5–5.1)
Sodium: 134 mmol/L — ABNORMAL LOW (ref 135–145)
Total Bilirubin: 1.3 mg/dL — ABNORMAL HIGH (ref 0.3–1.2)
Total Protein: 5.3 g/dL — ABNORMAL LOW (ref 6.5–8.1)

## 2016-01-17 LAB — MAGNESIUM: Magnesium: 1.7 mg/dL (ref 1.7–2.4)

## 2016-01-17 LAB — GLUCOSE, CAPILLARY
Glucose-Capillary: 118 mg/dL — ABNORMAL HIGH (ref 65–99)
Glucose-Capillary: 122 mg/dL — ABNORMAL HIGH (ref 65–99)
Glucose-Capillary: 126 mg/dL — ABNORMAL HIGH (ref 65–99)
Glucose-Capillary: 120 mg/dL — ABNORMAL HIGH (ref 65–99)
Glucose-Capillary: 121 mg/dL — ABNORMAL HIGH (ref 65–99)

## 2016-01-17 LAB — URINE CULTURE: CULTURE: NO GROWTH

## 2016-01-17 MED ORDER — ALBUTEROL SULFATE (2.5 MG/3ML) 0.083% IN NEBU: 2.5000 mg | INHALATION_SOLUTION | RESPIRATORY_TRACT | Status: DC | PRN

## 2016-01-17 NOTE — Progress Notes (Signed)
SLP Cancellation Note  Patient Details Name: Jacqueline [name] MRN: [id number] DOB: May 05, 1928   Cancelled treatment:       Reason Eval/Treat Not Completed: Medical issues which prohibited therapy ; remains on BiPAP.  Blenda MountsCouture, [name] 01/17/2016, 11:31 AM

## 2016-01-17 NOTE — Progress Notes (Signed)
Notified M.D. of exposed metal plate on left elbow; he also assessed the wound.

## 2016-01-17 NOTE — Progress Notes (Signed)
PROGRESS NOTE    Jacqueline [name]  RUE:[id number] DOB: Dec 04, 1928 DOA: 02/10/2016 PCP: Donato Schultz, DO   Brief Narrative:  82 y.o. WF PMHx Dementia,Anxiety, Depression, HTN,PAF (paroxysmal atrial fibrillation), HLD, Anorexia, ESBL UTI ,Urinary retention, Chronic Hyponatremia, Compression fracture of T12 vertebra   Brought to the ED with severe shortness of breath and "wet cough" for the last 24 hrs. History is obtained by her family due to the patient's symptoms and dementia. No wheezing or  Hemoptysis noted. T max up to 100.3. No apparent  chest pain, palpitations,or  loss of consciousness. She has been very weak and lethargic over recent days, and this morning they noted increased confusion. Also, decreased urine retention was reported, last ouput last night.  At the time of visit, patient appears very uncomfortable gasping for air. Only sick contact 1 week ago at home.     Subjective: 10/6 patient opens eyes briefly to stimulation however babbles incoherently. As soon as BiPAP mask is removed patient desats into the mid 80s.   Assessment & Plan:   Active Problems:   HTN (hypertension)   Hearing loss of aging   Severe muscle deconditioning   Pressure ulcer   Generalized weakness   Anorexia   Generalized anxiety disorder   Multifocal pneumonia   Sepsis (HCC)   Respiratory distress, acute   Acute respiratory failure with hypoxia (HCC)   Bradycardia   Dilated cardiomyopathy (HCC)   Nonrheumatic aortic valve stenosis   ARDS (adult respiratory distress syndrome) (HCC)   Sepsis; UTI vs (multifocal pneumonia per CXR) organism unknown/ARDS   -Patient meets criteria given  tachypnea, fever, leukocytosis, and evidence of  Multiorgan dysfunction.   --Continue current antibiotics complete 5 day course. -DuoNeb QID  -Solu-Medrol 60 mg daily -Normal saline to [facility] -Patient has worsening respiratory status--->ARDS by [facility] on 10/6  Urine retention, acute on chronic  , -Foley catheter  Cholecystitis vs Cholangitis vs Choledocholithiasis   -Patient with significant increase in liver enzymes and T bili since April 2017, plus fever, plus RUQ abdominal pain. Patient has not been receiving PO medication therefore not from amiodarone toxicity. -Abdominal ultrasound: Shows stones but no ductal dilation, if patient's enzymes do not begin to correct will consult GI. When stable obtain CT abdomen.  Hypertension/Hypotension -DC Hydralazine  -DC morphine  Atrial Fibrillation  -Currently in NSR, Amiodarone on hold secondary to requiring BiPAP  - Rate controlled -Continue meds when able to be more alert   Dilated Cardiomyopathy/Moderate Aortic Stenosis -Echocardiogram February 2017 consistent with dilated cardiomyopathy in a stenosis. -Echocardiogram: See results below -Strict in and out since admission 3.8 L -Daily weight Filed Weights   01/23/2016 1320 01/27/2016 2000 01/17/16 0400  Weight: 47.6 kg (105 lb) 50.4 kg (111 lb 1.6 oz) 51.4 kg (113 lb 5.1 oz)    Bradycardia -Improved but borderline would hold all nodal blocking agents  Depression -All  PO medication on hold   Hypokalemia -Potassium goal> 4    DVT prophylaxis: Lovenox Code Status: DO NOT RESUSCITATE Family Communication: Spoke at length with Tomasita Morrow (son), daughter-in-law in person, and daughter over the phone. Explained patient not expected to survive this hospitalization. Disposition Plan: SNF?   Consultants:  None   Procedures/Significant Events:  10/4 PCXR:Patchy lung opacities bilaterally most consistent with multifocal pneumonia., 10/4 CT head WO contrast:-Negative acute infarct.-Remote lacunar infarcts in the basal ganglia bilaterally. 10/5 RUQ Abdominal ultrasound:- several gallstones, largest measuring 1.2 cm, no wall thickening. No pericholecystic fluid.    Cultures  10/4 blood left hand/right AC NGTD 10/4 MRSA by PCR negative 10/5 urine  pending   Antimicrobials: Zosyn 10/4>> Vancomycin 10/4>>   Devices None   LINES / TUBES:  None    Continuous Infusions: . sodium chloride 10 mL/hr at 01/17/16 1220     Objective: Vitals:   01/17/16 1320 01/17/16 1549 01/17/16 1639 01/17/16 1957  BP:  126/66 131/64   Pulse:   77   Resp:   (!) 22   Temp:  98.3 F (36.8 C)  97.2 F (36.2 C)  TempSrc:  Oral  Axillary  SpO2: 93%  95%   Weight:      Height:        Intake/Output Summary (Last 24 hours) at 01/17/16 2004 Last data filed at 01/17/16 1800  Gross per 24 hour  Intake             1235 ml  Output              300 ml  Net              935 ml   Filed Weights   01/16/16 1320 Jan 16, 2016 2000 01/17/16 0400  Weight: 47.6 kg (105 lb) 50.4 kg (111 lb 1.6 oz) 51.4 kg (113 lb 5.1 oz)    Examination:  General: patient opens eyes briefly to stimulation, however babbles incoherently.cachectic, positive acute respiratory distress Eyes: negative scleral hemorrhage, negative anisocoria, negative icterus ENT: Negative Runny nose, negative gingival bleeding, Neck:  Negative scars, masses, torticollis, lymphadenopathy, JVD Lungs: Decreased breath sounds diffusely, positive mild expiratory wheezing, negative  crackles Cardiovascular: Regular rate and rhythm without murmur gallop or rub normal S1 and S2 Abdomen: Positive abdominal pain to palpation especially RUQ, nondistended, positive soft, bowel sounds, no rebound, no ascites, no appreciable mass Extremities: No significant cyanosis, clubbing, or edema bilateral lower extremities Skin: Negative rashes, lesions, ulcers Psychiatric:  Unable to evaluate  Central nervous system:  Unable to evaluate  .     Data Reviewed: Care during the described time interval was provided by me .  I have reviewed this patient's available data, including medical history, events of note, physical examination, and all test results as part of my evaluation. I have personally reviewed and  interpreted all radiology studies.  CBC:  Recent Labs Lab 2016-01-16 1300 Jan 16, 2016 1800 01/16/16 0325 01/17/16 0306  WBC 15.3* 14.9* 17.7* 19.7*  NEUTROABS 14.3* 13.7*  --  18.8*  HGB 10.7* 11.1* 11.3* 11.0*  HCT 32.5* 33.6* 34.5* 33.9*  MCV 100.3* 100.6* 101.2* 103.0*  PLT 263 259 277 275   Basic Metabolic Panel:  Recent Labs Lab 01/16/2016 1300 2016/01/16 1800 01/16/16 0325 01/17/16 0539  NA 128* 131* 133* 134*  K 4.0 3.5 3.2* 4.1  CL 98* 98* 97* 102  CO2 22 24 22  19*  GLUCOSE 115* 120* 90 126*  BUN 21* 18 16 23*  CREATININE 0.60 0.63 0.56 0.82  CALCIUM 8.3* 8.0* 7.9* 7.9*  MG  --   --   --  1.7   GFR: Estimated Creatinine Clearance: 39.7 mL/min (by C-G formula based on SCr of 0.82 mg/dL). Liver Function Tests:  Recent Labs Lab 01/16/2016 1300 2016/01/16 1800 01/16/16 0325 01/17/16 0539  AST 279* 286* 286* 251*  ALT 283* 285* 281* 258*  ALKPHOS 154* 147* 143* 113  BILITOT 1.3* 1.2 1.6* 1.3*  PROT 5.6* 5.3* 5.6* 5.3*  ALBUMIN 2.4* 2.4* 2.4* 2.3*   No results for input(s): LIPASE, AMYLASE in the last 168 hours. No  results for input(s): AMMONIA in the last 168 hours. Coagulation Profile:  Recent Labs Lab 05/30/2015 1800  INR 1.42   Cardiac Enzymes: No results for input(s): CKTOTAL, CKMB, CKMBINDEX, TROPONINI in the last 168 hours. BNP (last 3 results) No results for input(s): PROBNP in the last 8760 hours. HbA1C: No results for input(s): HGBA1C in the last 72 hours. CBG:  Recent Labs Lab 01/17/16 0437 01/17/16 0742 01/17/16 1205 01/17/16 1618  GLUCAP 118* 122* 126* 120*   Lipid Profile: No results for input(s): CHOL, HDL, LDLCALC, TRIG, CHOLHDL, LDLDIRECT in the last 72 hours. Thyroid Function Tests: No results for input(s): TSH, T4TOTAL, FREET4, T3FREE, THYROIDAB in the last 72 hours. Anemia Panel: No results for input(s): VITAMINB12, FOLATE, FERRITIN, TIBC, IRON, RETICCTPCT in the last 72 hours. Urine analysis:    Component Value Date/Time    COLORURINE YELLOW 01/16/2016 0105   APPEARANCEUR CLEAR 01/16/2016 0105   LABSPEC 1.016 01/16/2016 0105   PHURINE 6.0 01/16/2016 0105   GLUCOSEU NEGATIVE 01/16/2016 0105   HGBUR NEGATIVE 01/16/2016 0105   BILIRUBINUR NEGATIVE 01/16/2016 0105   BILIRUBINUR Neg 09/26/2015 1548   KETONESUR NEGATIVE 01/16/2016 0105   PROTEINUR NEGATIVE 01/16/2016 0105   UROBILINOGEN 2.0 09/26/2015 1548   UROBILINOGEN 1.0 01/15/2015 2221   NITRITE NEGATIVE 01/16/2016 0105   LEUKOCYTESUR NEGATIVE 01/16/2016 0105   Sepsis Labs: @LABRCNTIP (procalcitonin:4,lacticidven:4)  ) Recent Results (from the past 240 hour(s))  Blood Culture (routine x 2)     Status: None (Preliminary result)   Collection Time: 05/30/2015 12:55 PM  Result Value Ref Range Status   Specimen Description BLOOD LEFT HAND  Final   Special Requests BOTTLES DRAWN AEROBIC AND ANAEROBIC 5CC  Final   Culture NO GROWTH 2 DAYS  Final   Report Status PENDING  Incomplete  Blood Culture (routine x 2)     Status: None (Preliminary result)   Collection Time: 05/30/2015  1:00 PM  Result Value Ref Range Status   Specimen Description BLOOD RIGHT ANTECUBITAL  Final   Special Requests BOTTLES DRAWN AEROBIC ONLY 5CC  Final   Culture NO GROWTH 2 DAYS  Final   Report Status PENDING  Incomplete  MRSA PCR Screening     Status: None   Collection Time: 05/30/2015  9:17 PM  Result Value Ref Range Status   MRSA by PCR NEGATIVE NEGATIVE Final    Comment:        The GeneXpert MRSA Assay (FDA approved for NASAL specimens only), is one component of a comprehensive MRSA colonization surveillance program. It is not intended to diagnose MRSA infection nor to guide or monitor treatment for MRSA infections.   Urine culture     Status: None   Collection Time: 01/16/16  1:05 AM  Result Value Ref Range Status   Specimen Description URINE, CATHETERIZED  Final   Special Requests NONE  Final   Culture NO GROWTH  Final   Report Status 01/17/2016 FINAL  Final          Radiology Studies: Dg Chest Port 1 View  Result Date: 01/17/2016 CLINICAL DATA:  Shortness of breath, respiratory failure, history of atrial fibrillation, cardiomyopathy, aortic stenosis EXAM: PORTABLE CHEST 1 VIEW COMPARISON:  Portable chest x-ray of January 15, 2016 FINDINGS: The pulmonary interstitial markings are increase with confluent alveolar edema or pneumonia more conspicuous bilaterally. The cardiac silhouette remains enlarged. The left hemidiaphragm remains obscured. The patient has undergone previous CABG there is calcification in the wall of the aortic arch. The bones are diffusely osteopenic. There  is chronic deformity of the left humeral head. IMPRESSION: Deterioration in the appearance of the chest consistent with progressive pulmonary alveolar edema or bilateral pneumonia. Previous CABG Aortic atherosclerosis. Electronically Signed   By: David  [place] M.D.   On: 01/17/2016 08:40   US Abdomen Limited Ruq  Result Date: 01/16/2016 CLINICAL DATA:  Elevated liver function tests.  Elevated bilirubin. EXAM: US ABDOMEN LIMITED - RIGHT UPPER QUADRANT COMPARISON:  CT, 04/25/2015. FINDINGS: Gallbladder: There several gallstones, largest measuring 1.2 cm. There is no wall thickening. No pericholecystic fluid. Common bile duct: Diameter: 2.4 mm Liver: No focal lesion identified. Within normal limits in parenchymal echogenicity. IMPRESSION: 1. Gallstones without evidence of acute cholecystitis. 2. No other abnormality.  No bile duct dilation. Electronically Signed   By: Amie Portland M.D.   On: 01/16/2016 16:12        Scheduled Meds: . enoxaparin (LOVENOX) injection  40 mg Subcutaneous Q24H  . ipratropium-albuterol  3 mL Nebulization Q6H  . methylPREDNISolone (SOLU-MEDROL) injection  60 mg Intravenous Q24H  . piperacillin-tazobactam (ZOSYN)  IV  3.375 g Intravenous Q8H  . vancomycin  500 mg Intravenous Q24H   Continuous Infusions: . sodium chloride 10 mL/hr at 01/17/16 1220      LOS: 2 days    Time spent: 40 minutes    Steffie Waggoner, Roselind Messier, MD [facility] Pager [phone]   If 7PM-7AM, please contact night-coverage [email or web address] Password [email or web address] 01/17/2016, 8:04 PM

## 2016-01-18 ENCOUNTER — Inpatient Hospital Stay (HOSPITAL_COMMUNITY): Payer: Medicare HMO

## 2016-01-18 DIAGNOSIS — K8309 Other cholangitis: Secondary | ICD-10-CM

## 2016-01-18 DIAGNOSIS — I4891 Unspecified atrial fibrillation: Secondary | ICD-10-CM

## 2016-01-18 DIAGNOSIS — E876 Hypokalemia: Secondary | ICD-10-CM

## 2016-01-18 DIAGNOSIS — F329 Major depressive disorder, single episode, unspecified: Secondary | ICD-10-CM

## 2016-01-18 DIAGNOSIS — R339 Retention of urine, unspecified: Secondary | ICD-10-CM

## 2016-01-18 LAB — CBC WITH DIFFERENTIAL/PLATELET
BASOS PCT: 0 %
Basophils Absolute: 0 10*3/uL (ref 0.0–0.1)
EOS ABS: 0 10*3/uL (ref 0.0–0.7)
EOS PCT: 0 %
HEMATOCRIT: 33.5 % — AB (ref 36.0–46.0)
HEMOGLOBIN: 10.5 g/dL — AB (ref 12.0–15.0)
LYMPHS ABS: 0.2 10*3/uL — AB (ref 0.7–4.0)
Lymphocytes Relative: 1 %
MCH: 32.4 pg (ref 26.0–34.0)
MCHC: 31.3 g/dL (ref 30.0–36.0)
MCV: 103.4 fL — ABNORMAL HIGH (ref 78.0–100.0)
MONO ABS: 0.3 10*3/uL (ref 0.1–1.0)
MONOS PCT: 2 %
Neutro Abs: 17.3 10*3/uL — ABNORMAL HIGH (ref 1.7–7.7)
Neutrophils Relative %: 97 %
Platelets: 310 10*3/uL (ref 150–400)
RBC: 3.24 MIL/uL — ABNORMAL LOW (ref 3.87–5.11)
RDW: 16.2 % — ABNORMAL HIGH (ref 11.5–15.5)
WBC: 17.8 10*3/uL — ABNORMAL HIGH (ref 4.0–10.5)

## 2016-01-18 LAB — GLUCOSE, CAPILLARY
GLUCOSE-CAPILLARY: 109 mg/dL — AB (ref 65–99)
GLUCOSE-CAPILLARY: 144 mg/dL — AB (ref 65–99)
Glucose-Capillary: 113 mg/dL — ABNORMAL HIGH (ref 65–99)
Glucose-Capillary: 115 mg/dL — ABNORMAL HIGH (ref 65–99)
Glucose-Capillary: 118 mg/dL — ABNORMAL HIGH (ref 65–99)
Glucose-Capillary: 119 mg/dL — ABNORMAL HIGH (ref 65–99)
Glucose-Capillary: 138 mg/dL — ABNORMAL HIGH (ref 65–99)

## 2016-01-18 LAB — COMPREHENSIVE METABOLIC PANEL
ALBUMIN: 2.2 g/dL — AB (ref 3.5–5.0)
ALK PHOS: 102 U/L (ref 38–126)
ALT: 228 U/L — AB (ref 14–54)
ANION GAP: 10 (ref 5–15)
AST: 177 U/L — ABNORMAL HIGH (ref 15–41)
BILIRUBIN TOTAL: 1.5 mg/dL — AB (ref 0.3–1.2)
BUN: 31 mg/dL — AB (ref 6–20)
CALCIUM: 8.3 mg/dL — AB (ref 8.9–10.3)
CO2: 20 mmol/L — AB (ref 22–32)
CREATININE: 1.04 mg/dL — AB (ref 0.44–1.00)
Chloride: 109 mmol/L (ref 101–111)
GFR calc Af Amer: 55 mL/min — ABNORMAL LOW (ref >60)
GFR calc non Af Amer: 48 mL/min — ABNORMAL LOW (ref >60)
GLUCOSE: 116 mg/dL — AB (ref 65–99)
Potassium: 4 mmol/L (ref 3.5–5.1)
SODIUM: 139 mmol/L (ref 135–145)
TOTAL PROTEIN: 5.4 g/dL — AB (ref 6.5–8.1)

## 2016-01-18 LAB — MAGNESIUM: Magnesium: 1.8 mg/dL (ref 1.7–2.4)

## 2016-01-18 MED ORDER — MORPHINE SULFATE (PF) 2 MG/ML IV SOLN: 0.2500 mg | INTRAVENOUS | Status: DC | PRN

## 2016-01-18 NOTE — Progress Notes (Addendum)
SLP Cancellation Note  Patient Details Name: Jacqueline [name] MRN: [id number] DOB: 10-26-1928   Cancelled treatment:        Pt continues to require BiPAP for respiratory support and not ready for swallow assessment at this time. Will follow.    Royce MacadamiaLitaker, Jacqueline [name] 01/18/2016, 11:00 AM  Jacqueline [name] M.Ed [employer] Pager [phone]

## 2016-01-18 NOTE — Progress Notes (Signed)
PROGRESS NOTE    Jacqueline [name]  ZYS:[id number] DOB: 1928-12-16 DOA: 01/17/2016 PCP: Donato SchultzYvonne R Lowne Chase, DO   Brief Narrative:  82 y.o. WF PMHx Dementia,Anxiety, Depression, HTN,PAF (paroxysmal atrial fibrillation), HLD, Anorexia, ESBL UTI ,Urinary retention, Chronic Hyponatremia, Compression fracture of T12 vertebra   Brought to the ED with severe shortness of breath and "wet cough" for the last 24 hrs. History is obtained by her family due to the patient's symptoms and dementia. No wheezing or  Hemoptysis noted. T max up to 100.3. No apparent  chest pain, palpitations,or  loss of consciousness. She has been very weak and lethargic over recent days, and this morning they noted increased confusion. Also, decreased urine retention was reported, last ouput last night.  At the time of visit, patient appears very uncomfortable gasping for air. Only sick contact 1 week ago at home.     Subjective: 10/7 patient opens eyes briefly to stimulation however babbles incoherently. As soon as BiPAP mask is removed patient desats into the mid 80s.   Assessment & Plan:   Active Problems:   HTN (hypertension)   Hearing loss of aging   Severe muscle deconditioning   Pressure ulcer   Generalized weakness   Anorexia   Generalized anxiety disorder   Multifocal pneumonia   Sepsis (HCC)   Respiratory distress, acute   Acute respiratory failure with hypoxia (HCC)   Bradycardia   Dilated cardiomyopathy (HCC)   Nonrheumatic aortic valve stenosis   ARDS (adult respiratory distress syndrome) (HCC)   Sepsis; UTI vs (multifocal pneumonia per CXR) organism unknown/ARDS   -Patient meets criteria given  tachypnea, fever, leukocytosis, and evidence of  Multiorgan dysfunction.   --Continue current antibiotics complete 5 day course. -DuoNeb QID  -Solu-Medrol 60 mg daily -[facility] -Patient has worsening respiratory status--->[facility] on 10/7  Urine  retention, acute on chronic , -Foley catheter  Cholecystitis vs Cholangitis vs Choledocholithiasis   -Patient with significant increase in liver enzymes and T bili since April 2017, plus fever, plus RUQ abdominal pain. Patient has not been receiving PO medication therefore not from amiodarone toxicity. -Abdominal ultrasound: Shows stones but no ductal dilation, if patient's enzymes do not begin to correct will consult GI. When stable obtain CT abdomen.  Hypertension/Hypotension -DC Hydralazine   Atrial Fibrillation  -Currently in NSR, Amiodarone on hold secondary to requiring BiPAP  - Rate controlled -Continue meds when able to be more alert   Dilated Cardiomyopathy/Moderate Aortic Stenosis -Echocardiogram February 2017 consistent with dilated cardiomyopathy in a stenosis. -Echocardiogram: See results below -Strict in and out since admission +3.8 L -Daily weight Filed Weights   02-21-2016 2000 01/17/16 0400 01/18/16 0344  Weight: 50.4 kg (111 lb 1.6 oz) 51.4 kg (113 lb 5.1 oz) 49 kg (108 lb)    Bradycardia -Improved but borderline would hold all nodal blocking agents  Depression -All  PO medication on hold   Hypokalemia -Potassium goal> 4    DVT prophylaxis: Lovenox Code Status: DO NOT RESUSCITATE Family Communication: Spoke at length with Tomasita MorrowSteven Trumbull (son), & Daughter Explained will not survive this hospitalization. Disposition Plan: Terminal wean on 10/8?   Consultants:  None   Procedures/Significant Events:  10/4 PCXR:Patchy lung opacities bilaterally most consistent with multifocal pneumonia., 10/4 CT head WO contrast:-Negative acute infarct.-Remote lacunar infarcts in the basal ganglia bilaterally. 10/5 RUQ Abdominal ultrasound:- several gallstones, largest measuring 1.2 cm, no wall thickening. No pericholecystic fluid.    Cultures 10/4 blood  left hand/right AC NGTD 10/4 MRSA by PCR negative 10/5 urine pending   Antimicrobials: Zosyn  10/4>> Vancomycin 10/4>>   Devices None   LINES / TUBES:  None    Continuous Infusions: . sodium chloride 10 mL/hr at 01/17/16 1220     Objective: Vitals:   01/18/16 0000 01/18/16 0204 01/18/16 0344 01/18/16 0442  BP: 130/63  (!) 112/59 (!) 112/59  Pulse: 89  91 87  Resp: (!) 27  (!) 24 (!) 25  Temp: 98.9 F (37.2 C)  99 F (37.2 C)   TempSrc: Axillary  Axillary   SpO2: 94% 94% 93% 90%  Weight:   49 kg (108 lb)   Height:        Intake/Output Summary (Last 24 hours) at 01/18/16 4098 Last data filed at 01/18/16 0500  Gross per 24 hour  Intake              845 ml  Output              350 ml  Net              495 ml   Filed Weights   03-Feb-2016 2000 01/17/16 0400 01/18/16 0344  Weight: 50.4 kg (111 lb 1.6 oz) 51.4 kg (113 lb 5.1 oz) 49 kg (108 lb)    Examination:  General: patient opens eyes briefly to stimulation,.cachectic, positive acute respiratory distress Eyes: negative scleral hemorrhage, negative anisocoria, negative icterus ENT: Negative Runny nose, negative gingival bleeding, Neck:  Negative scars, masses, torticollis, lymphadenopathy, JVD Lungs: Decreased breath sounds diffusely, positive mild expiratory wheezing, negative  crackles Cardiovascular: Regular rate and rhythm without murmur gallop or rub normal S1 and S2 Abdomen: Positive abdominal pain to palpation especially RUQ, nondistended, positive soft, bowel sounds, no rebound, no ascites, no appreciable mass Extremities: No significant cyanosis, clubbing, or edema bilateral lower extremities Skin: Negative rashes, lesions, ulcers Psychiatric:  Unable to evaluate  Central nervous system:  Unable to evaluate  .     Data Reviewed: Care during the described time interval was provided by me .  I have reviewed this patient's available data, including medical history, events of note, physical examination, and all test results as part of my evaluation. I have personally reviewed and interpreted all  radiology studies.  CBC:  Recent Labs Lab 03-Feb-2016 1300 February 03, 2016 1800 01/16/16 0325 01/17/16 0306 01/18/16 0327  WBC 15.3* 14.9* 17.7* 19.7* 17.8*  NEUTROABS 14.3* 13.7*  --  18.8* 17.3*  HGB 10.7* 11.1* 11.3* 11.0* 10.5*  HCT 32.5* 33.6* 34.5* 33.9* 33.5*  MCV 100.3* 100.6* 101.2* 103.0* 103.4*  PLT 263 259 277 275 310   Basic Metabolic Panel:  Recent Labs Lab 2016-02-03 1300 02-03-16 1800 01/16/16 0325 01/17/16 0539 01/18/16 0327  NA 128* 131* 133* 134* 139  K 4.0 3.5 3.2* 4.1 4.0  CL 98* 98* 97* 102 109  CO2 22 24 22  19* 20*  GLUCOSE 115* 120* 90 126* 116*  BUN 21* 18 16 23* 31*  CREATININE 0.60 0.63 0.56 0.82 1.04*  CALCIUM 8.3* 8.0* 7.9* 7.9* 8.3*  MG  --   --   --  1.7 1.8   GFR: Estimated Creatinine Clearance: 30.6 mL/min (by C-G formula based on SCr of 1.04 mg/dL (H)). Liver Function Tests:  Recent Labs Lab 02/03/2016 1300 02/03/16 1800 01/16/16 0325 01/17/16 0539 01/18/16 0327  AST 279* 286* 286* 251* 177*  ALT 283* 285* 281* 258* 228*  ALKPHOS 154* 147* 143* 113 102  BILITOT 1.3* 1.2  1.6* 1.3* 1.5*  PROT 5.6* 5.3* 5.6* 5.3* 5.4*  ALBUMIN 2.4* 2.4* 2.4* 2.3* 2.2*   No results for input(s): LIPASE, AMYLASE in the last 168 hours. No results for input(s): AMMONIA in the last 168 hours. Coagulation Profile:  Recent Labs Lab 02/05/16 1800  INR 1.42   Cardiac Enzymes: No results for input(s): CKTOTAL, CKMB, CKMBINDEX, TROPONINI in the last 168 hours. BNP (last 3 results) No results for input(s): PROBNP in the last 8760 hours. HbA1C: No results for input(s): HGBA1C in the last 72 hours. CBG:  Recent Labs Lab 01/17/16 1205 01/17/16 1618 01/17/16 2241 01/17/16 2359 01/18/16 0341  GLUCAP 126* 120* 121* 115* 109*   Lipid Profile: No results for input(s): CHOL, HDL, LDLCALC, TRIG, CHOLHDL, LDLDIRECT in the last 72 hours. Thyroid Function Tests: No results for input(s): TSH, T4TOTAL, FREET4, T3FREE, THYROIDAB in the last 72 hours. Anemia  Panel: No results for input(s): VITAMINB12, FOLATE, FERRITIN, TIBC, IRON, RETICCTPCT in the last 72 hours. Urine analysis:    Component Value Date/Time   COLORURINE YELLOW 01/16/2016 0105   APPEARANCEUR CLEAR 01/16/2016 0105   LABSPEC 1.016 01/16/2016 0105   PHURINE 6.0 01/16/2016 0105   GLUCOSEU NEGATIVE 01/16/2016 0105   HGBUR NEGATIVE 01/16/2016 0105   BILIRUBINUR NEGATIVE 01/16/2016 0105   BILIRUBINUR Neg 09/26/2015 1548   KETONESUR NEGATIVE 01/16/2016 0105   PROTEINUR NEGATIVE 01/16/2016 0105   UROBILINOGEN 2.0 09/26/2015 1548   UROBILINOGEN 1.0 01/15/2015 2221   NITRITE NEGATIVE 01/16/2016 0105   LEUKOCYTESUR NEGATIVE 01/16/2016 0105   Sepsis Labs: @LABRCNTIP (procalcitonin:4,lacticidven:4)  ) Recent Results (from the past 240 hour(s))  Blood Culture (routine x 2)     Status: None (Preliminary result)   Collection Time: 05-Feb-2016 12:55 PM  Result Value Ref Range Status   Specimen Description BLOOD LEFT HAND  Final   Special Requests BOTTLES DRAWN AEROBIC AND ANAEROBIC 5CC  Final   Culture NO GROWTH 2 DAYS  Final   Report Status PENDING  Incomplete  Blood Culture (routine x 2)     Status: None (Preliminary result)   Collection Time: 05-Feb-2016  1:00 PM  Result Value Ref Range Status   Specimen Description BLOOD RIGHT ANTECUBITAL  Final   Special Requests BOTTLES DRAWN AEROBIC ONLY 5CC  Final   Culture NO GROWTH 2 DAYS  Final   Report Status PENDING  Incomplete  MRSA PCR Screening     Status: None   Collection Time: 02/05/2016  9:17 PM  Result Value Ref Range Status   MRSA by PCR NEGATIVE NEGATIVE Final    Comment:        The GeneXpert MRSA Assay (FDA approved for NASAL specimens only), is one component of a comprehensive MRSA colonization surveillance program. It is not intended to diagnose MRSA infection nor to guide or monitor treatment for MRSA infections.   Urine culture     Status: None   Collection Time: 01/16/16  1:05 AM  Result Value Ref Range Status    Specimen Description URINE, CATHETERIZED  Final   Special Requests NONE  Final   Culture NO GROWTH  Final   Report Status 01/17/2016 FINAL  Final         Radiology Studies: Dg Chest Port 1 View  Result Date: 01/17/2016 CLINICAL DATA:  Shortness of breath, respiratory failure, history of atrial fibrillation, cardiomyopathy, aortic stenosis EXAM: PORTABLE CHEST 1 VIEW COMPARISON:  Portable chest x-ray of 2016/02/05 FINDINGS: The pulmonary interstitial markings are increase with confluent alveolar edema or pneumonia more  conspicuous bilaterally. The cardiac silhouette remains enlarged. The left hemidiaphragm remains obscured. The patient has undergone previous CABG there is calcification in the wall of the aortic arch. The bones are diffusely osteopenic. There is chronic deformity of the left humeral head. IMPRESSION: Deterioration in the appearance of the chest consistent with progressive pulmonary alveolar edema or bilateral pneumonia. Previous CABG Aortic atherosclerosis. Electronically Signed   By: David  [place] M.D.   On: 01/17/2016 08:40   US Abdomen Limited Ruq  Result Date: 01/16/2016 CLINICAL DATA:  Elevated liver function tests.  Elevated bilirubin. EXAM: US ABDOMEN LIMITED - RIGHT UPPER QUADRANT COMPARISON:  CT, 04/25/2015. FINDINGS: Gallbladder: There several gallstones, largest measuring 1.2 cm. There is no wall thickening. No pericholecystic fluid. Common bile duct: Diameter: 2.4 mm Liver: No focal lesion identified. Within normal limits in parenchymal echogenicity. IMPRESSION: 1. Gallstones without evidence of acute cholecystitis. 2. No other abnormality.  No bile duct dilation. Electronically Signed   By: Amie Portland M.D.   On: 01/16/2016 16:12        Scheduled Meds: . enoxaparin (LOVENOX) injection  40 mg Subcutaneous Q24H  . ipratropium-albuterol  3 mL Nebulization Q6H  . methylPREDNISolone (SOLU-MEDROL) injection  60 mg Intravenous Q24H  . piperacillin-tazobactam  (ZOSYN)  IV  3.375 g Intravenous Q8H  . vancomycin  500 mg Intravenous Q24H   Continuous Infusions: . sodium chloride 10 mL/hr at 01/17/16 1220     LOS: 3 days    Time spent: 40 minutes    WOODS, Roselind Messier, MD [facility] Pager [phone]   If 7PM-7AM, please contact night-coverage [email or web address] Password [email or web address] 01/18/2016, 6:13 AM

## 2016-01-18 NOTE — Progress Notes (Signed)
Applied replicare dressing to skin around edge of BiPAP mask to prevent breakdown. Pt. Tolerated well

## 2016-01-18 NOTE — Plan of Care (Signed)
Problem: Tissue Perfusion: Goal: Risk factors for ineffective tissue perfusion will decrease Patient currently using bi-pap to assist with oxygenation needs

## 2016-01-18 NOTE — Progress Notes (Signed)
Pharmacy Antibiotic Note  Jacqueline [name] is a 82 y.o. female admitted on 02/10/2016 with sepsis from PNA and now progressing to ARDS.  Pharmacy has been consulted for vancomycin and Zosyn dosing.  Her renal function is trending up slowly.  Patient remains afebrile and her WBC is improving.   Plan: - Continue vanc 500mg  IV Q24H - Continue Zosyn 3.375gm IV Q8H, 4 hr infusion - F/u renal fxn, micro data, vanc trough if therapy exceeds 5 days  Height: 5\' 2"  (157.5 cm) Weight: 114 lb 6.4 oz (51.9 kg) IBW/kg (Calculated) : 50.1  Temp (24hrs), Avg:98.3 F (36.8 C), Min:97.2 F (36.2 C), Max:99 F (37.2 C)   Recent Labs Lab 02/07/2016 1300 01/14/2016 1311 02/02/2016 1614 01/12/2016 1800 01/12/2016 1806 01/16/16 0325 01/17/16 0306 01/17/16 0539 01/18/16 0327  WBC 15.3*  --   --  14.9*  --  17.7* 19.7*  --  17.8*  CREATININE 0.60  --   --  0.63  --  0.56  --  0.82 1.04*  LATICACIDVEN  --  1.11 0.94 1.1 1.2  --   --   --   --     Estimated Creatinine Clearance: 31.3 mL/min (by C-G formula based on SCr of 1.04 mg/dL (H)).    No Known Allergies  Antimicrobials this admission: Vanc 10/4>> Zosyn 10/4>>  Dose adjustments this admission: N/A  Microbiology results: 10/4 BCx x2 - NGTD 10/4 MRSA PCR - negative 10/5 UCx - negative   Sirron Francesconi D. Laney Potashang, PharmD, BCPS Pager:  [phone] 01/18/2016, 8:17 AM

## 2016-01-19 DIAGNOSIS — Z515 Encounter for palliative care: Secondary | ICD-10-CM

## 2016-01-19 LAB — CBC WITH DIFFERENTIAL/PLATELET
BASOS ABS: 0 10*3/uL (ref 0.0–0.1)
Basophils Relative: 0 %
Eosinophils Absolute: 0 10*3/uL (ref 0.0–0.7)
Eosinophils Relative: 0 %
HEMATOCRIT: 35.5 % — AB (ref 36.0–46.0)
Hemoglobin: 10.9 g/dL — ABNORMAL LOW (ref 12.0–15.0)
LYMPHS PCT: 1 %
Lymphs Abs: 0.2 10*3/uL — ABNORMAL LOW (ref 0.7–4.0)
MCH: 32.6 pg (ref 26.0–34.0)
MCHC: 30.7 g/dL (ref 30.0–36.0)
MCV: 106.3 fL — AB (ref 78.0–100.0)
Monocytes Absolute: 0.4 10*3/uL (ref 0.1–1.0)
Monocytes Relative: 2 %
NEUTROS ABS: 20 10*3/uL — AB (ref 1.7–7.7)
Neutrophils Relative %: 97 %
Platelets: 282 10*3/uL (ref 150–400)
RBC: 3.34 MIL/uL — AB (ref 3.87–5.11)
RDW: 16.8 % — ABNORMAL HIGH (ref 11.5–15.5)
WBC: 20.6 10*3/uL — AB (ref 4.0–10.5)

## 2016-01-19 LAB — COMPREHENSIVE METABOLIC PANEL
ALT: 194 U/L — ABNORMAL HIGH (ref 14–54)
AST: 109 U/L — AB (ref 15–41)
Albumin: 2.2 g/dL — ABNORMAL LOW (ref 3.5–5.0)
Alkaline Phosphatase: 103 U/L (ref 38–126)
Anion gap: 12 (ref 5–15)
BUN: 40 mg/dL — AB (ref 6–20)
CHLORIDE: 107 mmol/L (ref 101–111)
CO2: 23 mmol/L (ref 22–32)
Calcium: 8.4 mg/dL — ABNORMAL LOW (ref 8.9–10.3)
Creatinine, Ser: 1.27 mg/dL — ABNORMAL HIGH (ref 0.44–1.00)
GFR calc Af Amer: 43 mL/min — ABNORMAL LOW (ref >60)
GFR, EST NON AFRICAN AMERICAN: 37 mL/min — AB (ref >60)
Glucose, Bld: 136 mg/dL — ABNORMAL HIGH (ref 65–99)
POTASSIUM: 4.2 mmol/L (ref 3.5–5.1)
SODIUM: 142 mmol/L (ref 135–145)
Total Bilirubin: 1.2 mg/dL (ref 0.3–1.2)
Total Protein: 5.4 g/dL — ABNORMAL LOW (ref 6.5–8.1)

## 2016-01-19 LAB — VANCOMYCIN, TROUGH: VANCOMYCIN TR: 13 ug/mL — AB (ref 15–20)

## 2016-01-19 LAB — MAGNESIUM: MAGNESIUM: 2.1 mg/dL (ref 1.7–2.4)

## 2016-01-19 LAB — GLUCOSE, CAPILLARY
GLUCOSE-CAPILLARY: 112 mg/dL — AB (ref 65–99)
GLUCOSE-CAPILLARY: 107 mg/dL — AB (ref 65–99)
Glucose-Capillary: 121 mg/dL — ABNORMAL HIGH (ref 65–99)

## 2016-01-19 MED ORDER — SODIUM CHLORIDE 0.9 % IV SOLN
1.0000 mg/h | INTRAVENOUS | Status: DC
  Administered 2016-01-19: 1 mg/h via INTRAVENOUS
  Filled 2016-01-19: qty 5

## 2016-01-19 MED ORDER — ORAL CARE MOUTH RINSE
15.0000 mL | Freq: Two times a day (BID) | OROMUCOSAL | Status: DC
  Administered 2016-01-19 (×2): 15 mL via OROMUCOSAL

## 2016-01-19 MED ORDER — MIDAZOLAM BOLUS VIA INFUSION
1.0000 mg | INTRAVENOUS | Status: DC | PRN
  Filled 2016-01-19: qty 1

## 2016-01-19 MED ORDER — SODIUM CHLORIDE 0.9 % IV SOLN
1.0000 mg/h | INTRAVENOUS | Status: DC
  Administered 2016-01-19: 1 mg/h via INTRAVENOUS
  Filled 2016-01-19: qty 10

## 2016-01-19 MED ORDER — MORPHINE BOLUS VIA INFUSION
2.0000 mg | INTRAVENOUS | Status: DC | PRN
  Filled 2016-01-19: qty 2

## 2016-01-19 MED ORDER — CHLORHEXIDINE GLUCONATE 0.12 % MT SOLN
15.0000 mL | Freq: Two times a day (BID) | OROMUCOSAL | Status: DC
  Administered 2016-01-19: 15 mL via OROMUCOSAL

## 2016-01-19 MED ORDER — VANCOMYCIN HCL IN DEXTROSE 750-5 MG/150ML-% IV SOLN
750.0000 mg | INTRAVENOUS | Status: DC
  Filled 2016-01-19: qty 150

## 2016-01-19 MED ORDER — ENOXAPARIN SODIUM 30 MG/0.3ML ~~LOC~~ SOLN: 30.0000 mg | SUBCUTANEOUS | Status: DC

## 2016-01-20 LAB — CULTURE, BLOOD (ROUTINE X 2)
CULTURE: NO GROWTH
Culture: NO GROWTH

## 2016-02-12 NOTE — Progress Notes (Addendum)
Pt went asystole at 2000 on 02/06/2016. Janice NorrieMisty Ennis, RN, BSN and Kristeen Misshris Woodard, RN, BSN listened for 2 minutes, no heart sounds heard. Family at bedside. Schorr, NP notified.

## 2016-02-12 NOTE — Progress Notes (Signed)
SLP Cancellation Note  Patient Details Name: Jacqueline [name] MRN: [id number] DOB: 05/10/1928   Cancelled treatment:        Pt is on BiPAP. Note from yesterday states every time BiPAP is removed, pt desats into 80's. Will follow.   Royce MacadamiaLitaker, Kriya Westra Willis 01/16/2016, 8:47 AM

## 2016-02-12 NOTE — Discharge Summary (Addendum)
Death Summary  Jacqueline [name] ZOX:[id number] DOB: 09-18-1928 DOA: 01/18/2016  PCP: Jacqueline [name] Chase, DO  Admit date: 02/08/2016 Date of Death: 02/04/2016  Final Diagnoses:  Sepsis due to multifocal PNA / ARDS w/ acute hypoxic respiratory failure Urinary retention, acute on chronic Cholecystitis vs Cholangitis vs Choledocholithiasis HTN Parox Atrial Fibrillation  Chronic Grade 1 Diastolic CHF - Moderate Aortic Stenosis Bradycardia Depression Hypokalemia  History of present illness:  82 y.o.female w/ a Hxof Dementia, Anxiety, Depression, HTN, PAF, HLD, Anorexia, ESBL UTI , Urinary retention, Chronic Hyponatremia, and T12 Compression fracture who was brought to the ED with severe shortness of breath and "wet cough" for 24 hrs. She had been very weak and lethargic over recent days.  Hospital Course:  The patient was admitted to the hospital and treated several days for pneumonia with ARDS and probable aspiration.  She was BiPAP dependent.  Aggressive measures failed to affect any significant improvement.  Lengthy discussion was had with the patient's family by the medical doctor as well as the palliative care team.  The decision was made to transition to a comfort focused approach.  The patient was placed on a narcotic and a benzodiazepine drip.  These appear to be quite effective in assuring her comfort and ameliorating any air hunger.  On 2015/10/27 at 20:00 the patient died peacefully with her family at the bedside.  SignedJetty Duhamel:  Jodel Mayhall T  [facility] 02/04/2016, 7:03 PM

## 2016-02-12 NOTE — Progress Notes (Addendum)
Pharmacy Antibiotic Note  Jacqueline [name] is a 82 y.o. female admitted on 01/20/2016 with sepsis from PNA and now progressing to ARDS.  Pharmacy has been consulted for vancomycin and Zosyn dosing.  Her SCr is trending up; however, vancomycin trough is slightly below goal.   Plan: - Increase vanc to 750mg  IV Q24H - Continue Zosyn 3.375gm IV Q8H, 4 hr infusion - F/u renal fxn, micro data  Height: 5\' 2"  (157.5 cm) Weight: 112 lb 8 oz (51 kg) IBW/kg (Calculated) : 50.1  Temp (24hrs), Avg:97.7 F (36.5 C), Min:96.5 F (35.8 C), Max:98.5 F (36.9 C)   Recent Labs Lab January 13, 2016 1311 January 13, 2016 1614 January 13, 2016 1800 January 13, 2016 1806 01/16/16 0325 01/17/16 0306 01/17/16 0539 01/18/16 0327 02/04/2016 0305 01/23/2016 1238  WBC  --   --  14.9*  --  17.7* 19.7*  --  17.8* 20.6*  --   CREATININE  --   --  0.63  --  0.56  --  0.82 1.04* 1.27*  --   LATICACIDVEN 1.11 0.94 1.1 1.2  --   --   --   --   --   --   VANCOTROUGH  --   --   --   --   --   --   --   --   --  13*    Estimated Creatinine Clearance: 25.6 mL/min (by C-G formula based on SCr of 1.27 mg/dL (H)).    No Known Allergies  Antimicrobials this admission: Vanc 10/4>> Zosyn 10/4>>  Dose adjustments this admission: 10/8 VT = 13 mcg/mL on 500mg  q24 > 750mg  q24  Microbiology results: 10/4 BCx x2 - NGTD 10/4 MRSA PCR - negative 10/5 UCx - negative   Jacqueline [name], PharmD, BCPS Pager:  [phone] 02/07/2016, 1:30 PM

## 2016-02-12 NOTE — Progress Notes (Signed)
[place] TEAM 1 - Stepdown/ICU TEAM  Jacqueline [name]  NKN:[id number] DOB: 06-30-1928 DOA: 01/27/2016 PCP: Ann Held, DO    Brief Narrative:  82 y.o.F Hx Dementia, Anxiety, Depression, HTN, PAF, HLD, Anorexia, ESBL UTI , Urinary retention, Chronic Hyponatremia, and T12 Compression fracture who was brought to the ED with severe shortness of breath and "wet cough" for 24 hrs. She had been very weak and lethargic over recent days.  Subjective: The patient is on BiPAP.  There is no family visiting at the time of my exam.  She is not able to communicate with me.  Though she is in respiratory distress she does not appear uncomfortable at the present time.  Assessment & Plan:  Sepsis due to multifocal PNA / ARDS w/ acute hypoxic respiratory failure Cultures all NGTD - UA not c/w UTI - CXR w/o signif change - not improving and remains BIPAP dependent   Urinary retention, acute on chronic Foley catheter  Cholecystitis vs Cholangitis vs Choledocholithiasis  significant increase in liver enzymes and T bili since April 2017, plus fever w/ RUQ abdominal pain - Abdom US noted stones but no ductal dilation  HTN Not an active issue presently   Parox Atrial Fibrillation  Rate controlled in sinus rhythm presently   Grade 1 Diastolic CHF - Moderate Aortic Stenosis Recent baseline wght 45-46kg (April 2017)  Filed Weights   01/18/16 0344 01/18/16 0738 February 16, 2016 0325  Weight: 49 kg (108 lb) 51.9 kg (114 lb 6.4 oz) 51 kg (112 lb 8 oz)    Bradycardia Resolved w/ rate 70-80 presently   Depression  Hypokalemia Corrected   DVT prophylaxis: lovenox  Code Status: DNR - NO CODE Family Communication: no family present at time of exam  Disposition Plan: Palliative Care has met w/ the family - plan is to transition to comfort care only this afternoon   Consultants:  none  Procedures: 10/5 TTE  Antimicrobials:  Zosyn 10/4 > Vancomycin 10/4 >  Objective: Blood  pressure (!) 94/45, pulse 79, temperature 97.8 F (36.6 C), temperature source Axillary, resp. rate 19, height _0  (1.575 m), weight 51 kg (112 lb 8 oz), SpO2 99 %.  Intake/Output Summary (Last 24 hours) at 02-16-16 1327 Last data filed at 02-16-2016 0400  Gross per 24 hour  Intake           468.17 ml  Output               60 ml  Net           408.17 ml   Filed Weights   01/18/16 0344 01/18/16 0738 02-16-2016 0325  Weight: 49 kg (108 lb) 51.9 kg (114 lb 6.4 oz) 51 kg (112 lb 8 oz)    Examination: General: requiring BIPAP - non-communicative  Lungs: poor air movement th/o all fields - poor inspiratory effort  Cardiovascular: Regular rate and rhythm  Abdomen: Nondistended, soft, no appreciable mass Extremities: No significant cyanosis, clubbing, or edema bilateral lower extremities  CBC:  Recent Labs Lab 02/11/2016 1300 01/17/2016 1800 01/16/16 0325 01/17/16 0306 01/18/16 0327 2016/02/16 0305  WBC 15.3* 14.9* 17.7* 19.7* 17.8* 20.6*  NEUTROABS 14.3* 13.7*  --  18.8* 17.3* 20.0*  HGB 10.7* 11.1* 11.3* 11.0* 10.5* 10.9*  HCT 32.5* 33.6* 34.5* 33.9* 33.5* 35.5*  MCV 100.3* 100.6* 101.2* 103.0* 103.4* 106.3*  PLT 263 259 277 275 310 379   Basic Metabolic Panel:  Recent Labs Lab 01/23/2016 1800 01/16/16 0325 01/17/16 0539 01/18/16 0327 02-16-16  0305  NA 131* 133* 134* 139 142  K 3.5 3.2* 4.1 4.0 4.2  CL 98* 97* 102 109 107  CO2 24 22 19* 20* 23  GLUCOSE 120* 90 126* 116* 136*  BUN 18 16 23* 31* 40*  CREATININE 0.63 0.56 0.82 1.04* 1.27*  CALCIUM 8.0* 7.9* 7.9* 8.3* 8.4*  MG  --   --  1.7 1.8 2.1   GFR: Estimated Creatinine Clearance: 25.6 mL/min (by C-G formula based on SCr of 1.27 mg/dL (H)).  Liver Function Tests:  Recent Labs Lab 01/22/2016 1800 01/16/16 0325 01/17/16 0539 01/18/16 0327 01/27/16 0305  AST 286* 286* 251* 177* 109*  ALT 285* 281* 258* 228* 194*  ALKPHOS 147* 143* 113 102 103  BILITOT 1.2 1.6* 1.3* 1.5* 1.2  PROT 5.3* 5.6* 5.3* 5.4* 5.4*    ALBUMIN 2.4* 2.4* 2.3* 2.2* 2.2*    Coagulation Profile:  Recent Labs Lab 01/30/2016 1800  INR 1.42    HbA1C: Hgb A1c MFr Bld  Date/Time Value Ref Range Status  08/25/2012 03:35 AM 5.4 <5.7 % Final    Comment:    (NOTE)                                                                       According to the ADA Clinical Practice Recommendations for 2011, when HbA1c is used as a screening test:  >=6.5%   Diagnostic of Diabetes Mellitus           (if abnormal result is confirmed) 5.7-6.4%   Increased risk of developing Diabetes Mellitus References:Diagnosis and Classification of Diabetes Mellitus,Diabetes Care,2011,34(Suppl 1):S62-S69 and Standards of Medical Care in         Diabetes - 2011,Diabetes Care,2011,34 (Suppl 1):S11-S61.  04/28/2012 11:00 AM 6.1 4.6 - 6.5 % Final    Comment:    Glycemic Control Guidelines for People with Diabetes:Non Diabetic:  <6%Goal of Therapy: <7%Additional Action Suggested:  >8%     CBG:  Recent Labs Lab 01/18/16 2026 01/18/16 2354 27-Jan-2016 0255 01-27-2016 0815 Jan 27, 2016 1151  GLUCAP 138* 144* 121* 112* 107*    Recent Results (from the past 240 hour(s))  Blood Culture (routine x 2)     Status: None (Preliminary result)   Collection Time: 01/30/2016 12:55 PM  Result Value Ref Range Status   Specimen Description BLOOD LEFT HAND  Final   Special Requests BOTTLES DRAWN AEROBIC AND ANAEROBIC 5CC  Final   Culture NO GROWTH 3 DAYS  Final   Report Status PENDING  Incomplete  Blood Culture (routine x 2)     Status: None (Preliminary result)   Collection Time: 01/26/2016  1:00 PM  Result Value Ref Range Status   Specimen Description BLOOD RIGHT ANTECUBITAL  Final   Special Requests BOTTLES DRAWN AEROBIC ONLY 5CC  Final   Culture NO GROWTH 3 DAYS  Final   Report Status PENDING  Incomplete  MRSA PCR Screening     Status: None   Collection Time: 01/25/2016  9:17 PM  Result Value Ref Range Status   MRSA by PCR NEGATIVE NEGATIVE Final    Comment:         The GeneXpert MRSA Assay (FDA approved for NASAL specimens only), is one component of a comprehensive MRSA colonization surveillance  program. It is not intended to diagnose MRSA infection nor to guide or monitor treatment for MRSA infections.   Urine culture     Status: None   Collection Time: 01/16/16  1:05 AM  Result Value Ref Range Status   Specimen Description URINE, CATHETERIZED  Final   Special Requests NONE  Final   Culture NO GROWTH  Final   Report Status 01/17/2016 FINAL  Final     Scheduled Meds: . chlorhexidine  15 mL Mouth Rinse BID  . enoxaparin (LOVENOX) injection  30 mg Subcutaneous Q24H  . ipratropium-albuterol  3 mL Nebulization Q6H  . mouth rinse  15 mL Mouth Rinse q12n4p  . methylPREDNISolone (SOLU-MEDROL) injection  60 mg Intravenous Q24H  . piperacillin-tazobactam (ZOSYN)  IV  3.375 g Intravenous Q8H  . vancomycin  500 mg Intravenous Q24H   Continuous Infusions: . sodium chloride 10 mL/hr at 01/18/16 1800     LOS: 4 days   Cherene Altes, MD [facility] Office  [phone] Pager - Text Page per Shea Evans as per below:  On-Call/Text Page:      [email or web address]      password [email or web address]  If 7PM-7AM, please contact night-coverage [email or web address] Password [email or web address] 2016-01-30, 1:27 PM

## 2016-02-12 NOTE — Progress Notes (Signed)
Wasted 260mg  of morphine in the sink with Kristeen Misshris Woodard, RN, BSN.  Wasted 25mg  of versed in the sink with Kristeen Misshris Woodard, RN, BSN.  Alba DestineMisty L Ennis, RN, BSN

## 2016-02-12 NOTE — Consult Note (Signed)
Consultation Note Date: 02-06-2016   Patient Name: Jacqueline [name]  DOB: Oct 04, 1928  MRN: [id number]  Age / Sex: 82 y.o., female  PCP: Jacqueline Held, DO Referring Physician: Cherene Altes, MD  Reason for Consultation: Establishing goals of care, Non pain symptom management and Terminal Care  HPI/Patient Profile: 82 y.o. female     admitted on 01/17/2016    Clinical Assessment and Goals of Care:  82 y.o.WF PMHx Dementia,Anxiety, Depression, HTN,PAF (paroxysmal atrial fibrillation), HLD, Anorexia, ESBL UTI ,Urinary retention, Chronic Hyponatremia, Compression fracture of T12 vertebra. Patient has had hip fracture after fall in February 2017. She also fell and broke her humerus a few months ago. Ongoing physical and cognitive decline since the past several months.   Patient has been admitted for several days now with PNA, ARDS, possible aspiration, BiPAP dependency, not improving despite aggressive treatment. Initial discussions [name] between Dr Sherral Hammers and family on 01-18-16, and it was deemed prudent to undergo BiPAP removal and comfort care on 06-Feb-2016. Hence, a palliative consult has been obtained.   Patient seen and examined. A frail elderly lady, essentially unresponsive on BiPAP. Discussed with bedside RN. Met with son, daughter and daughter in law.   I introduced myself and palliative care as follows: Palliative medicine is specialized medical care for people living with serious illness. It focuses on providing relief from the symptoms and stress of a serious illness. The goal is to improve quality of life for both the patient and the family.  Brief life review performed, patient is from [place] originally. Patient's husband died 55 years ago. She has a son and a daughter. Son Jacqueline [name] and his wife Jacqueline [name] are primary caregivers, Jacqueline [name] is HCPOA agent.  Family talks about patient's ongoing  decline, they don't want her to suffer. They don't want her to be on BiPAP anymore. Additional information regarding comfort measures requested.   Discussed about end of life signs and symptoms, all questions answered to the best of my ability. Discussed with bedside RN, proceed with comfort care, liberation from BiPAP today, if appropriate, consider transfer to [facility] in am on 2016-02-07. Anticipate hospital death.    HCPOA  son Jacqueline [name].   SUMMARY OF RECOMMENDATIONS    DNR DNI Comfort care Compassionate liberation from BiPAP Morphine and Versed continuous infusions low dose with provisions for bolus dosing for symptom management of pain, dyspnea, air hunger at end of life D/C daily labs.   Code Status/Advance Care Planning:  DNR    Symptom Management:    as above, discussed with bedside RN  Palliative Prophylaxis:   Delirium Protocol  Additional Recommendations (Limitations, Scope, Preferences):  Full Comfort Care  Psycho-social/Spiritual:   Desire for further Chaplaincy support:yes  Additional Recommendations: Education on Hospice  Prognosis:   Hours - Days  Discharge Planning: Anticipated Hospital Death      Primary Diagnoses: Present on Admission: . Sepsis ([place]) . HTN (hypertension) . Hearing loss of aging . Severe muscle deconditioning . Anorexia . Generalized anxiety disorder . Pressure  ulcer . Multifocal pneumonia   I have reviewed the medical record, interviewed the patient and family, and examined the patient. The following aspects are pertinent.  Past Medical History:  Diagnosis Date  . Acute blood loss anemia   . Allergic rhinitis   . Anorexia   . Anxiety   . CAD (coronary artery disease)    a. 08/2012 Abnl MV;  b. 08/2012 CABGx5: LIMA->LAD, VG->Diag, VG->OM1->OM2, VG->RCA.  . Cerumen impaction   . Closed intertrochanteric fracture of left femur with routine healing   . Compression fracture of T12 vertebra ([facility])   . Constipation   .  Dementia   . Depression   . Diverticulitis   . Dyslipidemia   . ESBL (extended spectrum beta-lactamase) producing bacteria infection 04/2015   Urine  . Fracture of bone    left arm  . Generalized weakness   . GERD (gastroesophageal reflux disease)   . Glaucoma    bilateral  . Hearing loss of aging   . Hip fracture ([place])    right  . HLD (hyperlipidemia)   . HLD (hyperlipidemia)   . Humerus fracture 11/2015  . Hypertensive heart disease   . Hyponatremia   . Insomnia   . Mild aortic stenosis    a. 08/2012 Echo: EF 60-65%, no rwma, mild AS, mildly dil LA, PASP 26mHg.  . 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 Osteoporosis   . PAF ([place]oxysmal atrial fibrillation) ([place]    a. Rhythm mgmt w/ amio;  b. CHA2DS2VASc = 5-->No anticoagulation 2/2 dementia/falls.  . Physical deconditioning   . Pressure ulcer   . Protein calorie malnutrition ([place]   . Right knee pain   . Severe muscle deconditioning   . Unsteady gait   . Urinary retention   . UTI (lower urinary tract infection)    Social History   Social History  . Marital status: Widowed    Spouse name: N/A  . Number of children: 2  . Years of education: 13+   Occupational History  . Retired    Social History Main Topics  . Smoking status: Former Smoker    Types: Cigarettes  . Smokeless tobacco: Never Used  . Alcohol use No     Comment: Occasional  . Drug use: No  . Sexual activity: No   Other Topics Concern  . None   Social History Narrative   Regular exercise-no   Caffeine Use-yes   Family History  Problem Relation Age of Onset  . Cancer Mother     stomach  . Hypertension Other   . Diabetes Neg Hx   . Heart disease Neg Hx   . Stroke Neg Hx    Scheduled Meds: . chlorhexidine  15 mL Mouth Rinse BID  . enoxaparin (LOVENOX) injection  30 mg Subcutaneous Q24H  . ipratropium-albuterol  3 mL Nebulization Q6H  . mouth rinse  15 mL Mouth Rinse q12n4p  .  methylPREDNISolone (SOLU-MEDROL) injection  60 mg Intravenous Q24H  . piperacillin-tazobactam (ZOSYN)  IV  3.375 g Intravenous Q8H  . vancomycin  750 mg Intravenous Q24H   Continuous Infusions: . sodium chloride 10 mL/hr at 01/18/16 1800  . midazolam (VERSED) infusion    . morphine     PRN Meds:.albuterol, midazolam (VERSED) infusion **AND** midazolam, morphine injection, morphine **AND** morphine Medications Prior to Admission:  Prior to Admission medications   Medication Sig Start Date End Date Taking? Authorizing Provider  alendronate (FOSAMAX) 70 MG tablet Take 1 tablet (70  mg total) by mouth once a week. Take with a full glass of water on an empty stomach on Tuesdays Patient taking differently: Take 70 mg by mouth every Saturday. Take with a full glass of water on an empty stomach 08/12/15  Yes [name], DO  amiodarone (PACERONE) 200 MG tablet Take 1 tablet (200 mg total) by mouth daily. 10/16/15  Yes [name], DO  aspirin EC 325 MG tablet Take 325 mg by mouth every evening.    Yes Historical Provider, MD  atorvastatin (LIPITOR) 40 MG tablet TAKE 1 TABLET BY MOUTH DAILY AT 6 PM Patient taking differently: Take 40 mg by mouth daily at 6 PM.  09/05/15  Yes [name], DO  Calcium Carbonate-Vitamin D3 (RA CALCIUM 600/VITAMIN D-3) 600-400 MG-UNIT TABS Take 0.5 tablets by mouth 2 (two) times daily.    Yes Historical Provider, MD  ferrous sulfate 325 (65 FE) MG tablet Take 325 mg by mouth 2 (two) times daily with a meal.    Yes Historical Provider, MD  folic acid (FOLVITE) 1 MG tablet TAKE 1 TABLET BY MOUTH EVERY DAY Patient taking differently: Take 1 mg by mouth every day 11/12/15  Yes Midge Minium, MD  LUMIGAN 0.01 % SOLN Place 1 drop into both eyes at bedtime. 11/12/15  Yes Historical Provider, MD  memantine (NAMENDA) 10 MG tablet TAKE 1 TABLET (10 MG TOTAL) BY MOUTH 2 (TWO) TIMES DAILY. 02/07/15  Yes Adam Telford Nab, DO  metoprolol tartrate (LOPRESSOR) 25 MG  tablet Take 1 tablet (25 mg total) by mouth 2 (two) times daily. 12/13/15  Yes Rosalita Chessman Chase, DO  Multiple Vitamin (MULTIVITAMIN WITH MINERALS) TABS tablet Take 0.5 tablets by mouth 2 (two) times daily.    Yes Historical Provider, MD  Probiotic Product (ALIGN) 4 MG CAPS Take 4 mg by mouth daily.   Yes Historical Provider, MD  Protein POWD Take 1 scoop by mouth 2 (two) times daily.   Yes Historical Provider, MD  sertraline (ZOLOFT) 25 MG tablet TAKE 1 TABLET (25 MG TOTAL) BY MOUTH DAILY. 12/10/15  Yes [name], DO  simethicone (MYLICON) 383 MG chewable tablet Chew 125 mg by mouth daily.    Yes Historical Provider, MD  SODIUM CHLORIDE PO Take 1,000 mg by mouth daily.   Yes Historical Provider, MD  tamsulosin (FLOMAX) 0.4 MG CAPS capsule Take 0.4 mg by mouth daily after breakfast.   Yes Historical Provider, MD  acetaminophen (TYLENOL) 325 MG tablet Take 2 tablets (650 mg total) by mouth every 6 (six) hours as needed for mild pain or headache. Patient not taking: Reported on 11/20/2015 04/30/15   Domenic Polite, MD  diazepam (VALIUM) 2 MG tablet Take 0.5 tablets (1 mg total) by mouth every 12 (twelve) hours as needed for anxiety. Patient not taking: Reported on 01/14/2016 06/06/15   Jonetta Osgood, MD  docusate sodium (COLACE) 100 MG capsule Take 1 capsule (100 mg total) by mouth every 12 (twelve) hours. Patient not taking: Reported on 02/10/2016 11/20/15   Darlina Rumpf, MD  HYDROcodone-acetaminophen (NORCO/VICODIN) 5-325 MG tablet Take 1 tablet by mouth every 6 (six) hours as needed for severe pain. 11/20/15   Darlina Rumpf, MD  Lactobacillus ([employer] PROBIOTIC ACIDOPHILUS) 10 MG CAPS Take 1 capsule by mouth daily. Patient not taking: Reported on 02/11/2016 10/08/15   Rosalita Chessman Chase, DO   No Known Allergies Review of Systems Unresponsive on BiPAP   Physical Exam  Vital  Signs: BP (!) 94/45   Pulse 79   Temp 97.8 F (36.6 C) (Axillary)   Resp 19   Ht 5' 2"  (1.575 m)   Wt 51 kg (112  lb 8 oz)   SpO2 99%   BMI 20.58 kg/m  Pain Assessment: CPOT   Pain Score: Asleep   SpO2: SpO2: 99 % O2 Device:SpO2: 99 % O2 Flow Rate: .O2 Flow Rate (L/min): 14 L/min  IO: Intake/output summary:   Intake/Output Summary (Last 24 hours) at 01/29/2016 1400 Last data filed at 2016/01/29 0400  Gross per 24 hour  Intake           468.17 ml  Output               60 ml  Net           408.17 ml    LBM:   Baseline Weight: Weight: 47.6 kg (105 lb) Most recent weight: Weight: 51 kg (112 lb 8 oz)     Palliative Assessment/Data:   Flowsheet Rows   Flowsheet Row Most Recent Value  Intake Tab  Referral Department  Hospitalist  Unit at Time of Referral  Intermediate Care Unit  Palliative Care Primary Diagnosis  Pulmonary  Palliative Care Type  New Palliative care  Reason for referral  Non-pain Symptom, Clarify Goals of Care, End of Life Care Assistance  Date first seen by Palliative Care  Jan 29, 2016  Clinical Assessment  Palliative Performance Scale Score  10%  Pain Max last 24 hours  5  Pain Min Last 24 hours  4  Dyspnea Max Last 24 Hours  7  Dyspnea Min Last 24 hours  6  Psychosocial & Spiritual Assessment  Palliative Care Outcomes  Patient/Family meeting [name]?  Yes  Who was at the meeting?  son Keturah Barre  Palliative Care Outcomes  Clarified goals of care      Time In:  12 Time Out:  1330 Time Total:  90 min  Greater than 50%  of this time was spent counseling and coordinating care related to the above assessment and plan.  Signed by: Loistine Chance, MD  [phone]  Please contact Palliative Medicine Team phone at [phone] for questions and concerns.  For individual provider: See Shea Evans

## 2016-02-12 NOTE — Progress Notes (Signed)
Pt on 2L of O2 and saturations in the 70's. She is on Morphine and versed drips. Family at bedside. Will continue to monitor.

## 2016-02-12 DEATH — deceased

## 2017-10-06 IMAGING — CT CT CHEST W/ CM
2 of 5 series · 14 of 36 positions shown, 17 images · IV contrast (APPLIED)
Comparison: CT abdomen and pelvis 01/12/2015. PA and lateral chest
01/12/2015. MRI abdomen 12/21/2014.

CLINICAL DATA: Status post fall last night. Pain and bruising.
Initial encounter.

EXAM:
CT CHEST, ABDOMEN, AND PELVIS WITH CONTRAST
TECHNIQUE: Multidetector CT imaging of the chest, abdomen and pelvis was
performed following the standard protocol during bolus
administration of intravenous contrast.
CONTRAST:  100 ML OMNIPAQUE IOHEXOL 300 MG/ML  SOLN

[Series 2: cap with 2 · axial · 0.73mm/px · z∈[-772,-256]mm · 11 of 119 slices shown, 14 images]
[im 8/119  mediastinal]
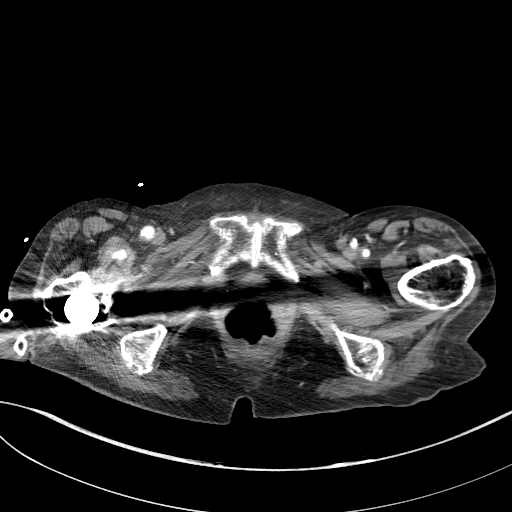
[im 8/119  lung]
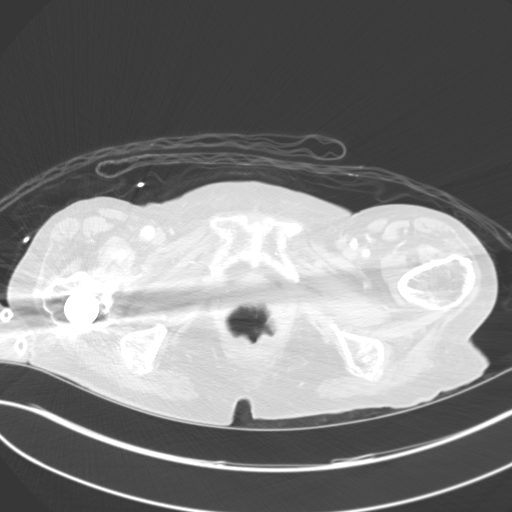
[im 23/119  lung]
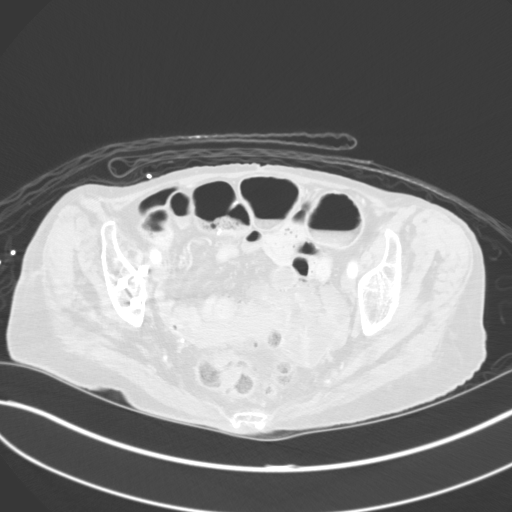
[im 30/119  lung]
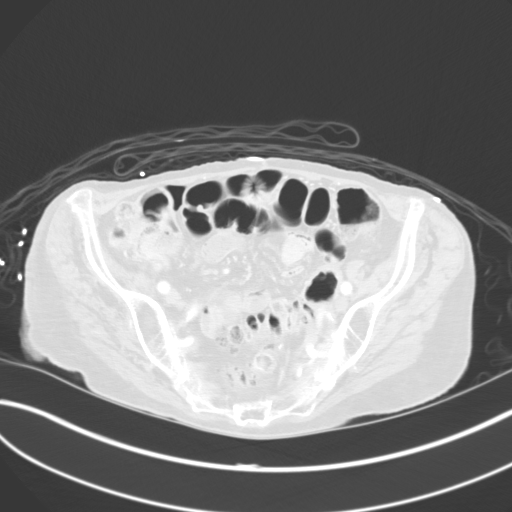
[im 37/119  lung]
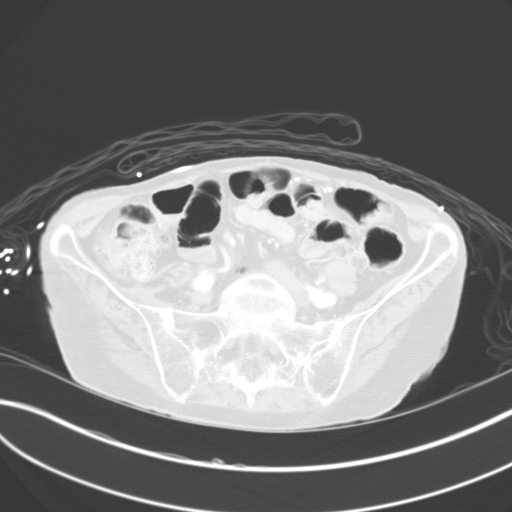
[im 52/119  mediastinal]
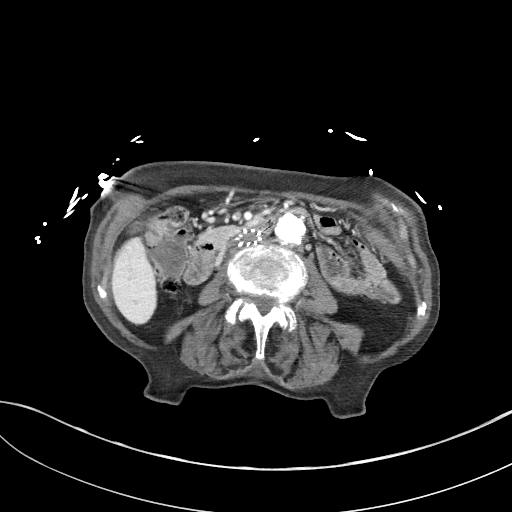
[im 52/119  lung]
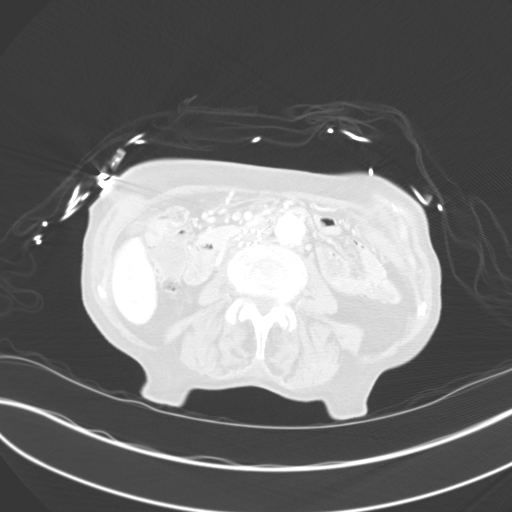
[im 60/119  lung]
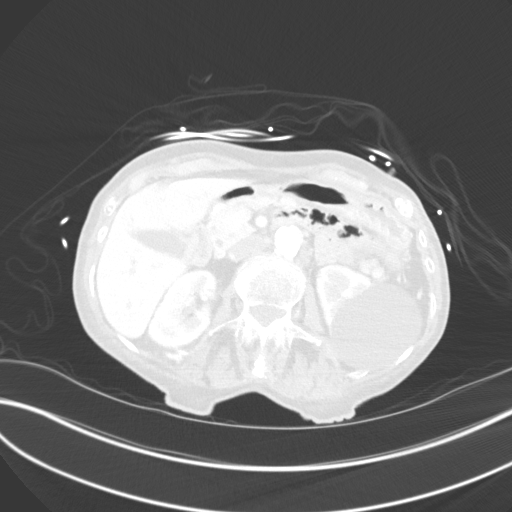
[im 67/119  lung]
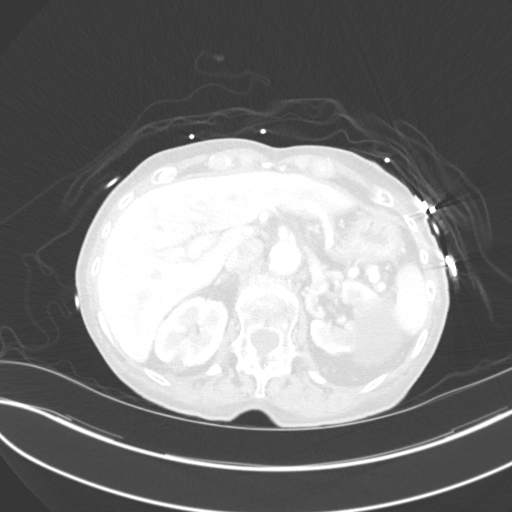
[im 82/119  lung]
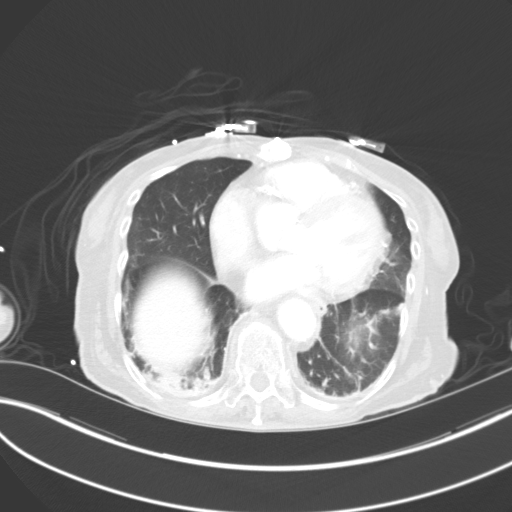
[im 89/119  mediastinal]
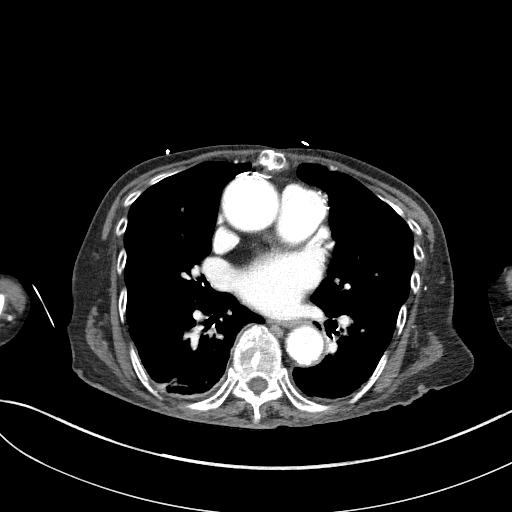
[im 89/119  lung]
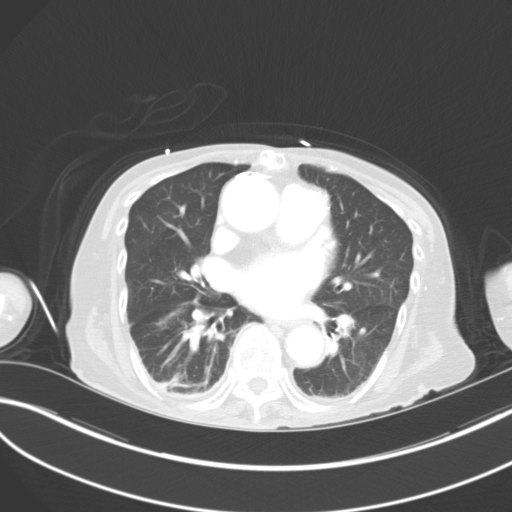
[im 96/119  lung]
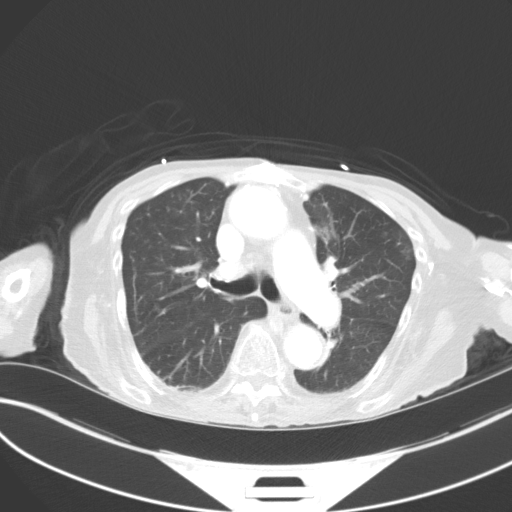
[im 111/119  lung]
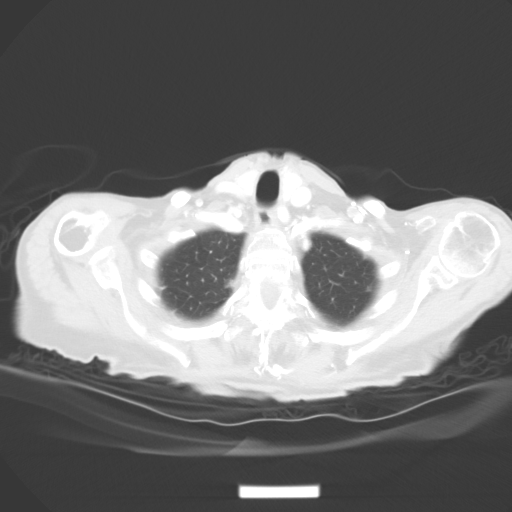

[Series 4: coronals · coronal · 0.75mm/px · 3 of 363 slices shown]
[im 73/363  lung]
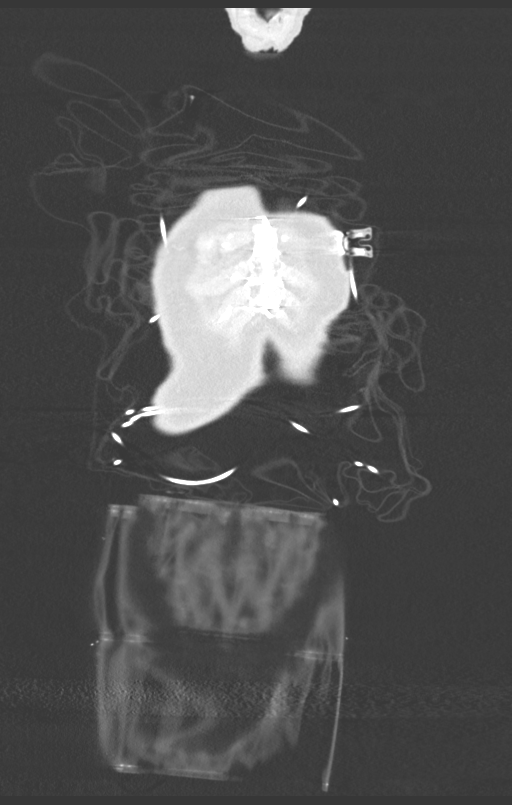
[im 145/363  lung]
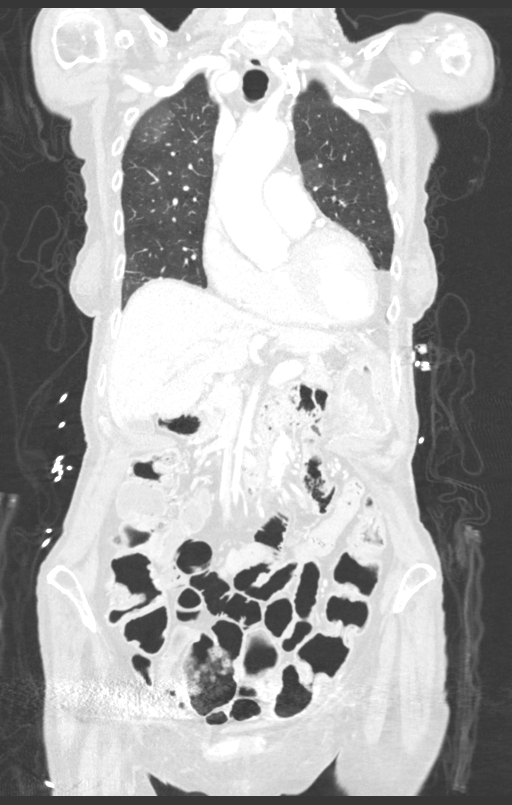
[im 218/363  lung]
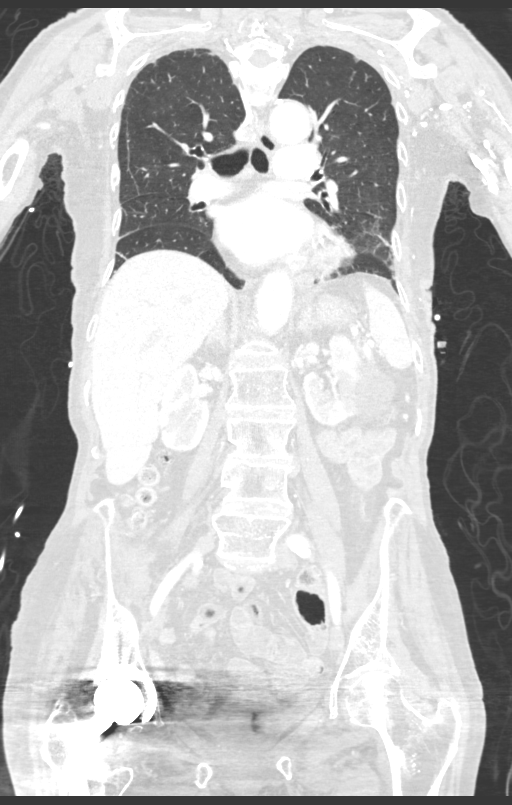

[14 of 36 positions shown; findings below may reference images not displayed]

FINDINGS: CT CHEST FINDINGS

Mediastinum/Lymph Nodes: No evidence of posttraumatic change is
identified. There is cardiomegaly. Calcific aortic and coronary
atherosclerosis is noted. The patient is status post CABG.

Lungs/Pleura: Trace bilateral pleural effusions are seen. There is
an area of ground-glass attenuation in the anterior right upper
lobe. Dependent atelectasis is noted.

Musculoskeletal: Remote T3 and T12 compression fractures are
identified. Vertebral body height loss of T6 has progressed since
the prior PA and lateral chest films. No acute fracture is
identified.

CT ABDOMEN PELVIS FINDINGS

Hepatobiliary: A single small gallstone is identified. No evidence
of cholecystitis is seen. No biliary ductal dilatation.

Pancreas: Multilocular cystic lesion in the tail the pancreas is
unchanged.

Spleen: Unremarkable.

Adrenals/Urinary Tract: The adrenal glands appear normal. Renal
cysts are unchanged.

Stomach/Bowel: Surgical anastomosis in the sigmoid colon is noted.
The colon is otherwise unremarkable. Stomach and small bowel appear
normal.

Vascular/Lymphatic: IVC filter is noted. The patient has extensive
aortoiliac atherosclerosis without aneurysm.

Reproductive: Status post hysterectomy.

Other: None.

Musculoskeletal: Right hip replacement is seen. Remote pubic ramus
fractures are present bilaterally. Lower lumbar spondylosis is
noted.
IMPRESSION: Negative for trauma to the chest, abdomen or pelvis.

Small area of ground-glass attenuation anterior right upper lobe may
be due to infectious or inflammatory process.

Cardiomegaly.

Extensive aortic and coronary atherosclerosis.

Single small gallstone without evidence of cholecystitis.

No change in a small multilocular cystic lesion in the tail of the
pancreas.

Remote T3, T6 and T12 compression fractures. Vertebral body height
loss of T6 has progressed since the prior examinations. Remote pubic
ramus fractures are also noted.

## 2017-10-06 IMAGING — CT CT CERVICAL SPINE W/O CM
5 of 9 series · 12 of 33 positions shown, 13 images · non-contrast
Comparison: 04/17/2015 head, cervical spine and maxillofacial CT.

CLINICAL DATA: Fall.  Altered mental status.  Bruises.

EXAM:
CT HEAD WITHOUT CONTRAST
CT MAXILLOFACIAL WITHOUT CONTRAST
CT CERVICAL SPINE WITHOUT CONTRAST
TECHNIQUE: Multidetector CT imaging of the head, cervical spine, and
maxillofacial structures were performed using the standard protocol
without intravenous contrast. Multiplanar CT image reconstructions
of the cervical spine and maxillofacial structures were also
generated.

[Series 4: max soft · axial · 0.31mm/px · z∈[-136,-80]mm · 2 of 86 slices shown]
[im 29/86  soft-tissue]
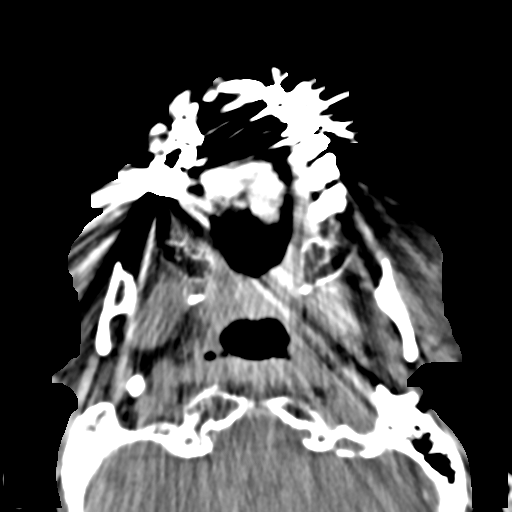
[im 57/86  soft-tissue]
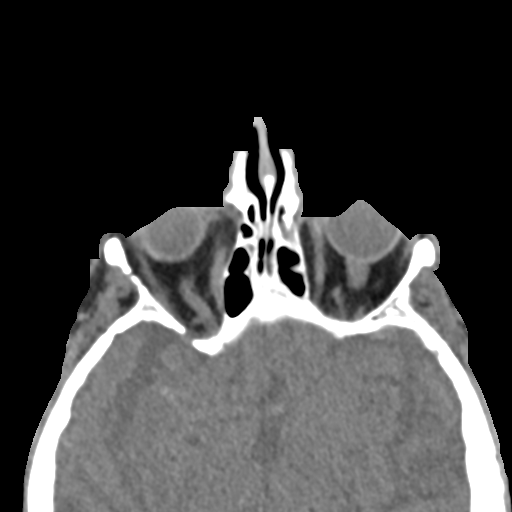

[Series 8: coronal soft · coronal · 0.36mm/px · 3 of 89 slices shown]
[im 23/89  bone]
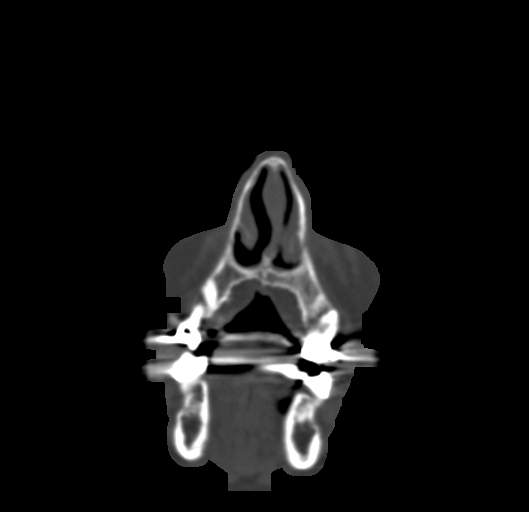
[im 45/89  bone]
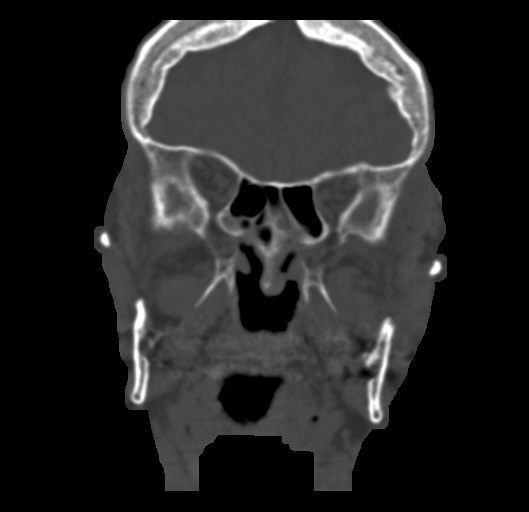
[im 67/89  bone]
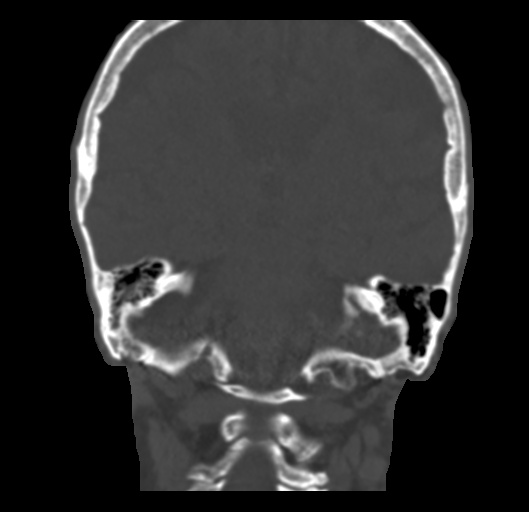

[Series 11: sagittal bone · sagittal · 0.39mm/px · 2 of 66 slices shown]
[im 22/66  bone]
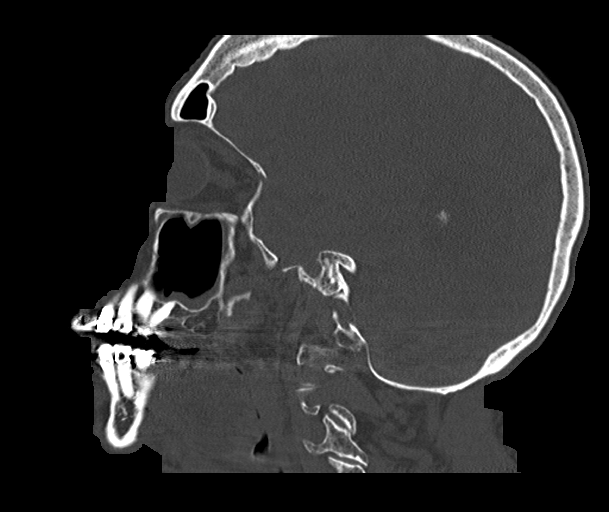
[im 44/66  bone]
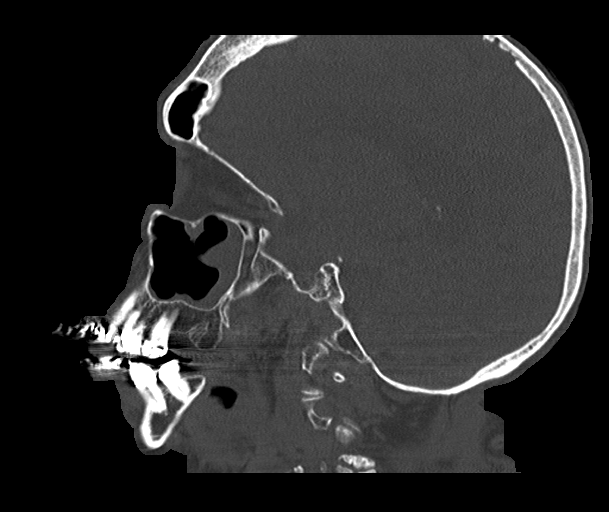

[Series 13: c spine soft · axial · 0.30mm/px · z∈[-218,-168]mm · 2 of 75 slices shown]
[im 25/75  soft-tissue]
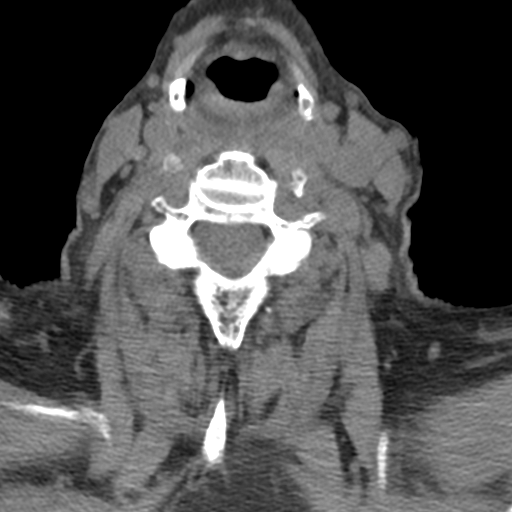
[im 50/75  soft-tissue]
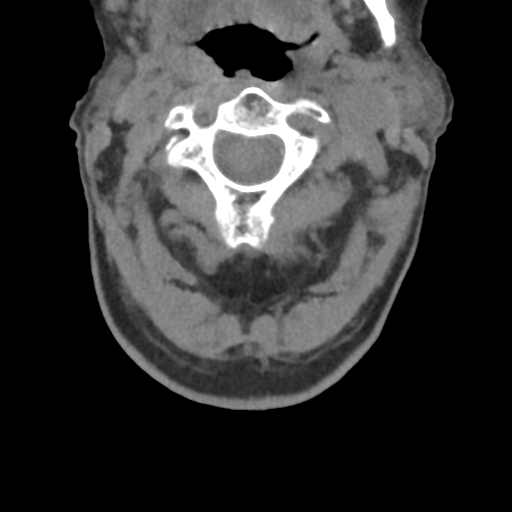

[Series 17: orthogonal axials · axial · 0.23mm/px · z∈[-282,-194]mm · 3 of 101 slices shown, 4 images]
[im 26/101  soft-tissue]
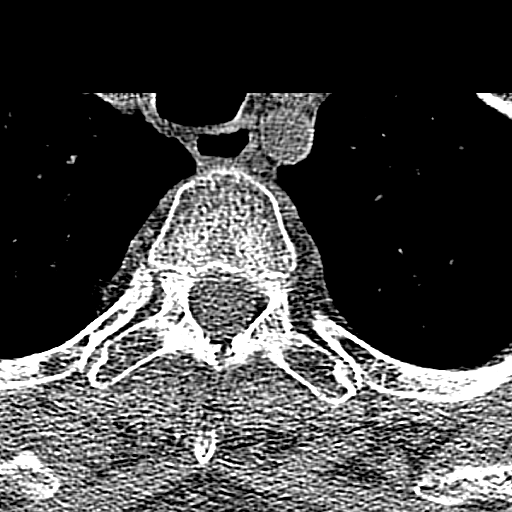
[im 26/101  bone]
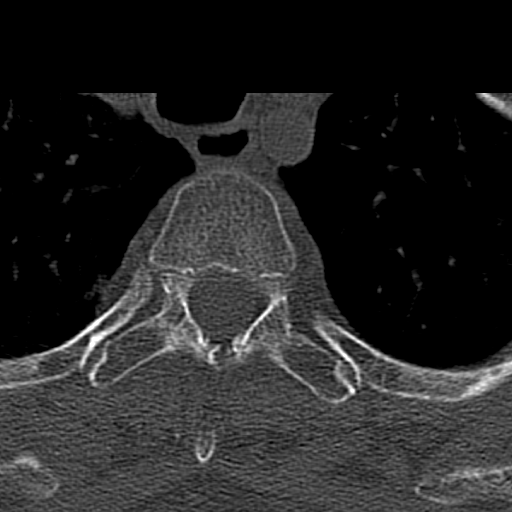
[im 51/101  bone]
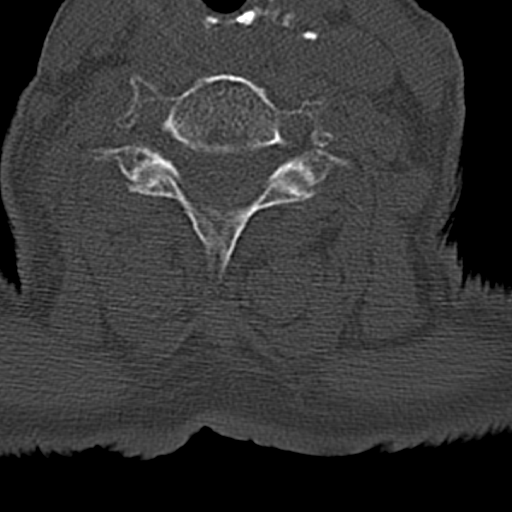
[im 76/101  bone]
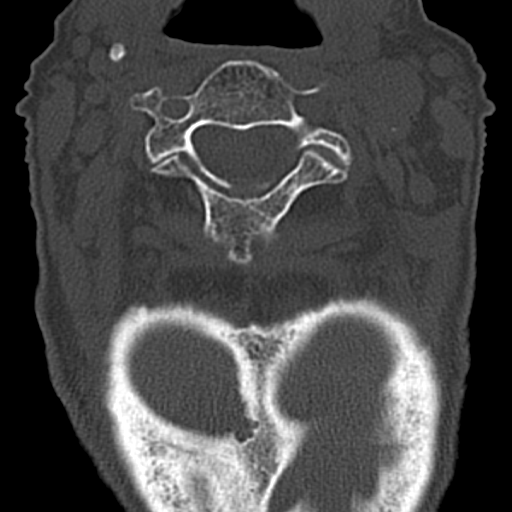

[12 of 33 positions shown; findings below may reference images not displayed]

FINDINGS: CT HEAD FINDINGS

Left frontal scalp contusion is decreased since 04/17/2015. No
evidence of parenchymal hemorrhage or extra-axial fluid collection.
No mass lesion, mass effect, or midline shift.

No CT evidence of acute infarction. Intracranial atherosclerosis.

Diffuse cerebral volume loss. Nonspecific prominent stable
subcortical and periventricular white matter hypodensity, most in
keeping with chronic small vessel ischemic change. Stable small
right lentiform nucleus lacunar infarct. No ventriculomegaly.

There are new small air-fluid levels in the bilateral maxillary
sinuses, with associated mucosal thickening in the bilateral
maxillary sinuses. Partial opacification of the bilateral ethmoidal
air cells. Stable tiny mucous retention cyst versus polyp in the
posterior left frontal sinus. Mild mucosal thickening and
inspissated secretions in the sphenoid sinus. New partial
opacification of the right mastoid air cells. Left mastoid air cells
are unopacified. No evidence of calvarial fracture.

CT MAXILLOFACIAL FINDINGS

Re- demonstration of small left frontal scalp contusion. No
maxillofacial fracture. The maxilla and mandible appear intact. The
nasal bones are unremarkable in appearance. There is stable
levocurvature of the intact appearing nasal septum. No dislocation
at the temporomandibular joints. Bilateral osteoarthritis in the
temporomandibular joints. The visualized dentition demonstrates no
acute abnormality. The orbits are intact bilaterally. Re-
demonstration of a new small fluid levels in the bilateral maxillary
sinuses with associated mild mucosal thickening in the bilateral
maxillary sinuses. Partial opacification of the bilateral ethmoidal
air cells. Stable tiny mucous retention cyst versus polyp in the
posterior left frontal sinus. Mild mucosal thickening with mild
inspissated secretions in the sphenoid sinus. No partial
opacification of right mastoid air cells. Unopacified left mastoid
air cells. No aggressive appearing focal osseous lesions.

The parapharyngeal fat planes are preserved. The nasopharynx,
oropharynx and hypopharynx are unremarkable in appearance. The
parotid and submandibular glands are within normal limits. No
cervical lymphadenopathy is seen.

CT CERVICAL SPINE FINDINGS

No fracture is detected in the cervical spine. Stable subacute to
chronic moderate T3 vertebral compression fracture with
approximately 40% loss of vertebral body height, unchanged since
04/17/2015. No prevertebral soft tissue swelling. Normal cervical
lordosis. Dens is well positioned between the lateral masses of C1.
The lateral masses appear well-aligned. Moderate degenerative disc
disease throughout the cervical spine, most prominent at C6-7.
Moderate facet arthropathy in the cervical spine, asymmetric to the
left. No significant cervical foraminal stenosis. No cervical spine
subluxation.

New partial opacification of the right mastoid air cells. Visualized
left mastoid air cells appear unopacified. No evidence of
intra-axial hemorrhage in the visualized brain. No gross cervical
canal hematoma. Apical right upper lobe 4 mm pulmonary nodule
(series 12/ image 73), stable since 04/17/2015. No cervical
adenopathy or other significant neck soft tissue abnormality.
IMPRESSION: 1. Small left frontal scalp contusion, decreased since 04/17/2015.
No evidence of acute intracranial abnormality. No calvarial
fracture.
2. No maxillofacial fracture.
3. No fracture or subluxation in the cervical spine.
4. Subacute to chronic moderate T3 vertebral compression fracture,
stable.
5. Acute paranasal sinusitis, worsened. New partial opacification of
the right mastoid air cells, nonspecific, favor a mild infectious or
inflammatory right mastoiditis.
6. Diffuse cerebral atrophy, prominent chronic small vessel ischemic
white matter change and chronic right basal ganglia lacunar infarct.
